# Patient Record
Sex: Female | Born: 1960
Health system: Southern US, Community
[De-identification: ages and names within clinical notes are randomized; demographics above are authoritative.]

## PROBLEM LIST (undated history)

## (undated) DIAGNOSIS — F419 Anxiety disorder, unspecified: Secondary | ICD-10-CM

## (undated) DIAGNOSIS — Z973 Presence of spectacles and contact lenses: Secondary | ICD-10-CM

## (undated) DIAGNOSIS — M1990 Unspecified osteoarthritis, unspecified site: Secondary | ICD-10-CM

## (undated) DIAGNOSIS — F32A Depression, unspecified: Secondary | ICD-10-CM

## (undated) DIAGNOSIS — T7840XA Allergy, unspecified, initial encounter: Secondary | ICD-10-CM

## (undated) DIAGNOSIS — K219 Gastro-esophageal reflux disease without esophagitis: Secondary | ICD-10-CM

## (undated) DIAGNOSIS — I10 Essential (primary) hypertension: Secondary | ICD-10-CM

## (undated) DIAGNOSIS — D649 Anemia, unspecified: Secondary | ICD-10-CM

## (undated) DIAGNOSIS — R51 Headache: Secondary | ICD-10-CM

## (undated) DIAGNOSIS — J45909 Unspecified asthma, uncomplicated: Secondary | ICD-10-CM

## (undated) DIAGNOSIS — R519 Headache, unspecified: Secondary | ICD-10-CM

## (undated) HISTORY — DX: Allergy, unspecified, initial encounter: T78.40XA

## (undated) HISTORY — PX: CHOLECYSTECTOMY: SHX55

## (undated) HISTORY — DX: Anxiety disorder, unspecified: F41.9

## (undated) HISTORY — DX: Depression, unspecified: F32.A

## (undated) HISTORY — PX: DILATION AND CURETTAGE OF UTERUS: SHX78

## (undated) MED FILL — Iron Sucrose Inj 20 MG/ML (Fe Equiv): INTRAVENOUS | Qty: 10 | Status: AC

---

## 1995-11-06 HISTORY — PX: INDUCED ABORTION: SHX677

## 2003-11-06 HISTORY — PX: GASTRIC BYPASS: SHX52

## 2006-02-25 ENCOUNTER — Ambulatory Visit: Payer: Self-pay | Admitting: Internal Medicine

## 2006-03-05 ENCOUNTER — Ambulatory Visit: Payer: Self-pay | Admitting: Internal Medicine

## 2006-03-29 ENCOUNTER — Ambulatory Visit: Payer: Self-pay | Admitting: Unknown Physician Specialty

## 2006-04-05 ENCOUNTER — Ambulatory Visit: Payer: Self-pay | Admitting: Internal Medicine

## 2006-04-08 ENCOUNTER — Ambulatory Visit: Payer: Self-pay | Admitting: Unknown Physician Specialty

## 2006-05-05 ENCOUNTER — Ambulatory Visit: Payer: Self-pay | Admitting: Internal Medicine

## 2006-06-05 ENCOUNTER — Ambulatory Visit: Payer: Self-pay | Admitting: Internal Medicine

## 2006-08-01 ENCOUNTER — Ambulatory Visit: Payer: Self-pay | Admitting: Internal Medicine

## 2006-08-05 ENCOUNTER — Ambulatory Visit: Payer: Self-pay | Admitting: Internal Medicine

## 2006-10-02 ENCOUNTER — Ambulatory Visit: Payer: Self-pay | Admitting: Internal Medicine

## 2006-10-05 ENCOUNTER — Ambulatory Visit: Payer: Self-pay | Admitting: Internal Medicine

## 2006-11-05 ENCOUNTER — Ambulatory Visit: Payer: Self-pay | Admitting: Internal Medicine

## 2006-12-11 ENCOUNTER — Ambulatory Visit: Payer: Self-pay | Admitting: Internal Medicine

## 2007-01-04 ENCOUNTER — Ambulatory Visit: Payer: Self-pay | Admitting: Internal Medicine

## 2007-09-06 ENCOUNTER — Ambulatory Visit: Payer: Self-pay | Admitting: Internal Medicine

## 2007-09-22 ENCOUNTER — Ambulatory Visit: Payer: Self-pay | Admitting: Internal Medicine

## 2007-10-06 ENCOUNTER — Ambulatory Visit: Payer: Self-pay | Admitting: Internal Medicine

## 2007-11-04 ENCOUNTER — Ambulatory Visit: Payer: Self-pay | Admitting: Unknown Physician Specialty

## 2007-11-06 ENCOUNTER — Ambulatory Visit: Payer: Self-pay | Admitting: Internal Medicine

## 2007-12-07 ENCOUNTER — Ambulatory Visit: Payer: Self-pay | Admitting: Internal Medicine

## 2008-02-04 ENCOUNTER — Ambulatory Visit: Payer: Self-pay | Admitting: Internal Medicine

## 2008-03-05 ENCOUNTER — Ambulatory Visit: Payer: Self-pay | Admitting: Internal Medicine

## 2008-05-10 ENCOUNTER — Ambulatory Visit: Payer: Self-pay | Admitting: Internal Medicine

## 2008-06-05 ENCOUNTER — Ambulatory Visit: Payer: Self-pay | Admitting: Internal Medicine

## 2008-11-05 ENCOUNTER — Ambulatory Visit: Payer: Self-pay | Admitting: Internal Medicine

## 2008-11-09 ENCOUNTER — Ambulatory Visit: Payer: Self-pay | Admitting: Unknown Physician Specialty

## 2008-11-10 ENCOUNTER — Ambulatory Visit: Payer: Self-pay | Admitting: Internal Medicine

## 2008-12-06 ENCOUNTER — Ambulatory Visit: Payer: Self-pay | Admitting: Internal Medicine

## 2010-02-27 ENCOUNTER — Emergency Department: Payer: Self-pay | Admitting: Emergency Medicine

## 2010-05-06 ENCOUNTER — Observation Stay: Payer: Self-pay | Admitting: Internal Medicine

## 2010-05-06 ENCOUNTER — Ambulatory Visit: Payer: Self-pay | Admitting: Internal Medicine

## 2010-06-26 ENCOUNTER — Ambulatory Visit: Payer: Self-pay | Admitting: Unknown Physician Specialty

## 2010-07-07 ENCOUNTER — Inpatient Hospital Stay: Payer: Self-pay | Admitting: Unknown Physician Specialty

## 2010-07-11 LAB — PATHOLOGY REPORT

## 2010-08-16 ENCOUNTER — Ambulatory Visit: Payer: Self-pay | Admitting: Unknown Physician Specialty

## 2010-11-05 HISTORY — PX: ABDOMINAL HYSTERECTOMY: SHX81

## 2011-09-24 ENCOUNTER — Ambulatory Visit: Payer: Self-pay | Admitting: Cardiovascular Disease

## 2012-07-06 LAB — HM PAP SMEAR: HM Pap smear: NORMAL

## 2013-09-18 ENCOUNTER — Ambulatory Visit: Payer: Self-pay | Admitting: Family Medicine

## 2013-09-18 LAB — HM MAMMOGRAPHY: HM Mammogram: NORMAL

## 2013-12-21 ENCOUNTER — Ambulatory Visit: Payer: Self-pay | Admitting: Family Medicine

## 2014-07-23 LAB — HM PAP SMEAR: HM Pap smear: NEGATIVE

## 2014-08-10 ENCOUNTER — Ambulatory Visit: Payer: Self-pay | Admitting: Gastroenterology

## 2014-08-10 LAB — HM COLONOSCOPY

## 2014-08-12 LAB — PATHOLOGY REPORT

## 2014-10-22 ENCOUNTER — Ambulatory Visit: Payer: Self-pay | Admitting: Internal Medicine

## 2014-10-23 ENCOUNTER — Emergency Department: Payer: Self-pay | Admitting: Emergency Medicine

## 2014-10-23 LAB — URINALYSIS, COMPLETE
Bacteria: NONE SEEN
Bilirubin,UR: NEGATIVE
Blood: NEGATIVE
Glucose,UR: NEGATIVE mg/dL (ref 0–75)
Ketone: NEGATIVE
Leukocyte Esterase: NEGATIVE
Nitrite: NEGATIVE
Ph: 6 (ref 4.5–8.0)
Protein: NEGATIVE
RBC,UR: 1 /HPF (ref 0–5)
Specific Gravity: 1.021 (ref 1.003–1.030)
Squamous Epithelial: 6
WBC UR: 1 /HPF (ref 0–5)

## 2014-10-23 LAB — CBC
HCT: 35.7 % (ref 35.0–47.0)
HGB: 11.6 g/dL — ABNORMAL LOW (ref 12.0–16.0)
MCH: 28.3 pg (ref 26.0–34.0)
MCHC: 32.5 g/dL (ref 32.0–36.0)
MCV: 87 fL (ref 80–100)
Platelet: 268 10*3/uL (ref 150–440)
RBC: 4.11 10*6/uL (ref 3.80–5.20)
RDW: 15.8 % — ABNORMAL HIGH (ref 11.5–14.5)
WBC: 6.2 10*3/uL (ref 3.6–11.0)

## 2014-10-23 LAB — TSH: Thyroid Stimulating Horm: 1.05 u[IU]/mL

## 2014-10-23 LAB — COMPREHENSIVE METABOLIC PANEL
Albumin: 3.4 g/dL (ref 3.4–5.0)
Alkaline Phosphatase: 117 U/L — ABNORMAL HIGH
Anion Gap: 7 (ref 7–16)
BUN: 13 mg/dL (ref 7–18)
Bilirubin,Total: 0.6 mg/dL (ref 0.2–1.0)
Calcium, Total: 8.5 mg/dL (ref 8.5–10.1)
Chloride: 102 mmol/L (ref 98–107)
Co2: 29 mmol/L (ref 21–32)
Creatinine: 0.98 mg/dL (ref 0.60–1.30)
EGFR (African American): >60
EGFR (Non-African Amer.): >60
Glucose: 89 mg/dL (ref 65–99)
Osmolality: 275 (ref 275–301)
Potassium: 3.5 mmol/L (ref 3.5–5.1)
SGOT(AST): 22 U/L (ref 15–37)
SGPT (ALT): 14 U/L
Sodium: 138 mmol/L (ref 136–145)
Total Protein: 7.1 g/dL (ref 6.4–8.2)

## 2014-10-23 LAB — TROPONIN I: Troponin-I: <0.02 ng/mL

## 2015-03-03 LAB — LIPID PANEL
Cholesterol: 220 mg/dL — AB (ref 0–200)
HDL: 78 mg/dL — AB (ref 35–70)
LDL Cholesterol: 121 mg/dL
Triglycerides: 104 mg/dL (ref 40–160)

## 2015-04-21 ENCOUNTER — Ambulatory Visit: Payer: Self-pay | Admitting: Family Medicine

## 2015-04-29 ENCOUNTER — Telehealth: Payer: Self-pay

## 2015-04-29 NOTE — Telephone Encounter (Signed)
Patient requesting refill. 

## 2015-04-29 NOTE — Telephone Encounter (Signed)
Could you schedule patient a follow up, and send Dr. Carlynn Purl the appointment the details.

## 2015-04-29 NOTE — Telephone Encounter (Signed)
Is she requesting 90 days? She needs follow up in July or August, please schedule and I will send enough

## 2015-05-02 NOTE — Telephone Encounter (Signed)
Patient will call back when she is in front of her calendar.

## 2015-05-02 NOTE — Telephone Encounter (Signed)
Could you please schedule patient a follow up appointment.

## 2015-05-04 MED ORDER — CHLORTHALIDONE 25 MG PO TABS: 25.0000 mg | ORAL_TABLET | Freq: Every day | ORAL | Status: DC

## 2015-05-04 MED ORDER — METOPROLOL TARTRATE 25 MG PO TABS: 25.0000 mg | ORAL_TABLET | Freq: Two times a day (BID) | ORAL | Status: DC

## 2015-05-04 NOTE — Telephone Encounter (Signed)
Please call her back. I sent 30 days of the blood pressure medications, but she needs follow up for the other refills, or at least have an appointment scheduled. Thank you

## 2015-05-05 ENCOUNTER — Other Ambulatory Visit: Payer: Self-pay

## 2015-05-06 NOTE — Telephone Encounter (Signed)
Left patient vm to make follow up appointment and also that Dr. Carlynn PurlSowles called in a 30 day supply for patient BP medication.

## 2015-05-10 ENCOUNTER — Other Ambulatory Visit: Payer: Self-pay

## 2015-05-10 MED ORDER — MELOXICAM 7.5 MG PO TABS: 7.5000 mg | ORAL_TABLET | Freq: Every day | ORAL | Status: DC

## 2015-05-10 NOTE — Telephone Encounter (Signed)
Spoke with patient and she will call us back once she get her information fixed with the insurance company.

## 2015-05-12 ENCOUNTER — Encounter: Payer: Self-pay | Admitting: Family Medicine

## 2015-05-12 DIAGNOSIS — R001 Bradycardia, unspecified: Secondary | ICD-10-CM | POA: Insufficient documentation

## 2015-05-12 DIAGNOSIS — F32A Depression, unspecified: Secondary | ICD-10-CM | POA: Insufficient documentation

## 2015-05-12 DIAGNOSIS — F329 Major depressive disorder, single episode, unspecified: Secondary | ICD-10-CM | POA: Insufficient documentation

## 2015-05-12 DIAGNOSIS — J309 Allergic rhinitis, unspecified: Secondary | ICD-10-CM | POA: Insufficient documentation

## 2015-05-12 DIAGNOSIS — L304 Erythema intertrigo: Secondary | ICD-10-CM | POA: Insufficient documentation

## 2015-05-12 DIAGNOSIS — E785 Hyperlipidemia, unspecified: Secondary | ICD-10-CM | POA: Insufficient documentation

## 2015-05-12 DIAGNOSIS — I10 Essential (primary) hypertension: Secondary | ICD-10-CM | POA: Insufficient documentation

## 2015-05-12 DIAGNOSIS — R600 Localized edema: Secondary | ICD-10-CM | POA: Insufficient documentation

## 2015-05-12 DIAGNOSIS — G43909 Migraine, unspecified, not intractable, without status migrainosus: Secondary | ICD-10-CM | POA: Insufficient documentation

## 2015-05-12 DIAGNOSIS — E162 Hypoglycemia, unspecified: Secondary | ICD-10-CM | POA: Insufficient documentation

## 2015-05-12 DIAGNOSIS — K219 Gastro-esophageal reflux disease without esophagitis: Secondary | ICD-10-CM | POA: Insufficient documentation

## 2015-05-12 DIAGNOSIS — F419 Anxiety disorder, unspecified: Secondary | ICD-10-CM | POA: Insufficient documentation

## 2015-05-12 DIAGNOSIS — T50905A Adverse effect of unspecified drugs, medicaments and biological substances, initial encounter: Secondary | ICD-10-CM

## 2015-05-12 DIAGNOSIS — G4700 Insomnia, unspecified: Secondary | ICD-10-CM | POA: Insufficient documentation

## 2015-05-12 DIAGNOSIS — K429 Umbilical hernia without obstruction or gangrene: Secondary | ICD-10-CM | POA: Insufficient documentation

## 2015-05-12 DIAGNOSIS — M722 Plantar fascial fibromatosis: Secondary | ICD-10-CM | POA: Insufficient documentation

## 2015-05-13 ENCOUNTER — Encounter: Payer: Self-pay | Admitting: Family Medicine

## 2015-05-13 ENCOUNTER — Ambulatory Visit (INDEPENDENT_AMBULATORY_CARE_PROVIDER_SITE_OTHER): Payer: BLUE CROSS/BLUE SHIELD | Admitting: Family Medicine

## 2015-05-13 VITALS — BP 118/66 | HR 89 | Temp 97.8°F | Resp 16 | Ht 67.0 in | Wt 246.3 lb

## 2015-05-13 DIAGNOSIS — R197 Diarrhea, unspecified: Secondary | ICD-10-CM

## 2015-05-13 DIAGNOSIS — G4700 Insomnia, unspecified: Secondary | ICD-10-CM

## 2015-05-13 DIAGNOSIS — R300 Dysuria: Secondary | ICD-10-CM

## 2015-05-13 DIAGNOSIS — M545 Low back pain, unspecified: Secondary | ICD-10-CM

## 2015-05-13 DIAGNOSIS — E785 Hyperlipidemia, unspecified: Secondary | ICD-10-CM

## 2015-05-13 LAB — POCT URINALYSIS DIPSTICK
Bilirubin, UA: NEGATIVE
Blood, UA: NEGATIVE
Glucose, UA: NEGATIVE
Ketones, UA: NEGATIVE
Leukocytes, UA: NEGATIVE
Nitrite, UA: NEGATIVE
Protein, UA: NEGATIVE
Spec Grav, UA: 1.015
Urobilinogen, UA: 0.2
pH, UA: 6.5

## 2015-05-13 MED ORDER — SIMVASTATIN 20 MG PO TABS: 20.0000 mg | ORAL_TABLET | Freq: Every day | ORAL | Status: DC

## 2015-05-13 MED ORDER — MELOXICAM 7.5 MG PO TABS: 7.5000 mg | ORAL_TABLET | Freq: Every day | ORAL | Status: DC

## 2015-05-13 MED ORDER — CIPROFLOXACIN HCL 250 MG PO TABS: 250.0000 mg | ORAL_TABLET | Freq: Two times a day (BID) | ORAL | Status: DC

## 2015-05-13 MED ORDER — TRAZODONE HCL 50 MG PO TABS: 50.0000 mg | ORAL_TABLET | Freq: Every day | ORAL | Status: DC

## 2015-05-13 NOTE — Progress Notes (Signed)
Name: Savannah Manning   MRN: [id number]    DOB: April 09, 1961   Date:05/13/2015       Progress Note  Subjective  Chief Complaint  Chief Complaint  Patient presents with  . Diarrhea    since monday nausea no vomiting with abdominal pain. 4-5x times per day denies any blood  . Back Pain    low back since monday, burning when urinating, no abnormal activity or lifting  . Blood Pressure Check    pt wants froms resubmitted from last year with current vital signs  . Headache    not a miagraine started monday also, but has not took anything for it    HPI  Diarrhea: she states that she ate at a chinese buffet in [place] six days ago, the following day she felt nauseated, regurgitation and the following day developed bloating and 2 days later developed loose stools that is getting progressively worse. She has watery stools at least five times daily, some nausea but no vomiting. Mild abdominal cramping and bloating.  She went with friends but she is the only one that got sick, however everybody else had crab legs.  She is feeling tired and has a headache. She has increased her fluid intake but not sure of how many ounces per day.  Dysuria: she stated that over the past few days she developed dysuria, she denies urinary frequency,  noticing low back pain.   Insomnia: taking Trazodone and needs refill of medication  Hyperlipidemia: taking Simvastatin, and denies side effects of medication  Patient Active Problem List   Diagnosis Date Noted  . Allergic rhinitis 05/12/2015  . Benign essential HTN 05/12/2015  . Anxiety and depression 05/12/2015  . Bradycardia, drug induced 05/12/2015  . Insomnia, persistent 05/12/2015  . Dyslipidemia 05/12/2015  . Edema extremities 05/12/2015  . Gastro-esophageal reflux disease without esophagitis 05/12/2015  . Hypoglycemia 05/12/2015  . Eczema intertrigo 05/12/2015  . Anemia, iron deficiency 05/12/2015  . Migraine 05/12/2015  . Plantar fasciitis 05/12/2015  .  Umbilical hernia without obstruction and without gangrene 05/12/2015  . Extreme obesity 02/13/2008    Past Surgical History  Procedure Laterality Date  . Abdominal hysterectomy  2012  . Gastric bypass  2005    Family History  Problem Relation Age of Onset  . Hypertension Mother   . Cancer Father     prostate  . Heart disease Brother     History   Social History  . Marital Status: Single    Spouse Name: N/A  . Number of Children: N/A  . Years of Education: N/A   Occupational History  . Not on file.   Social History Main Topics  . Smoking status: Never Smoker   . Smokeless tobacco: Never Used  . Alcohol Use: No  . Drug Use: No  . Sexual Activity: Not Currently   Other Topics Concern  . Not on file   Social History Narrative     Current outpatient prescriptions:  .  albuterol (VENTOLIN HFA) 108 (90 BASE) MCG/ACT inhaler, Inhale into the lungs., Disp: , Rfl:  .  Butalbital-APAP-Caffeine 50-300-40 MG CAPS, Take by mouth., Disp: , Rfl:  .  chlorthalidone (HYGROTON) 25 MG tablet, Take 1 tablet (25 mg total) by mouth daily., Disp: 30 tablet, Rfl: 0 .  loratadine (CLARITIN) 10 MG tablet, Take by mouth., Disp: , Rfl:  .  Lorcaserin HCl (BELVIQ) 10 MG TABS, Take by mouth., Disp: , Rfl:  .  meloxicam (MOBIC) 7.5 MG tablet, Take 1  tablet (7.5 mg total) by mouth daily., Disp: 90 tablet, Rfl: 1 .  metoprolol tartrate (LOPRESSOR) 25 MG tablet, Take 1 tablet (25 mg total) by mouth 2 (two) times daily., Disp: 30 tablet, Rfl: 0 .  omeprazole (PRILOSEC) 40 MG capsule, Take by mouth., Disp: , Rfl:  .  ondansetron (ZOFRAN) 4 MG tablet, Take by mouth., Disp: , Rfl:  .  simvastatin (ZOCOR) 20 MG tablet, Take 1 tablet (20 mg total) by mouth daily at 6 PM., Disp: 90 tablet, Rfl: 1 .  traZODone (DESYREL) 50 MG tablet, Take 1 tablet (50 mg total) by mouth at bedtime., Disp: 90 tablet, Rfl: 1 .  ciprofloxacin (CIPRO) 250 MG tablet, Take 1 tablet (250 mg total) by mouth 2 (two) times daily.,  Disp: 10 tablet, Rfl: 0  Allergies  Allergen Reactions  . Lisinopril Swelling    Facial Swelling  . Ace Inhibitors     angioedema  . Codeine   . Valsartan Other (See Comments)     ROS  Constitutional: Negative for fever , positive for weight change.  Respiratory: Negative for cough and shortness of breath.   Cardiovascular: Negative for chest pain or palpitations.  Gastrointestinal: positive  for abdominal pain, no bowel changes.  Musculoskeletal: Negative for gait problem or joint swelling.  Skin: positive  for rash.  Neurological: Negative for dizziness or headache.  No other specific complaints in a complete review of systems (except as listed in HPI above).  Objective  Filed Vitals:   05/13/15 0952  BP: 118/66  Pulse: 89  Temp: 97.8 F (36.6 C)  TempSrc: Oral  Resp: 16  Height: 5\' 7"  (1.702 m)  Weight: 246 lb 4.8 oz (111.721 kg)  SpO2: 98%    Body mass index is 38.57 kg/(m^2).  Physical Exam  Constitutional: Patient appears well-developed and well-nourished. No distress. Obesity  Eyes:  No scleral icterus. PERL Neck: Normal range of motion. Neck supple. Cardiovascular: Normal rate, regular rhythm and normal heart sounds.  No murmur heard. No BLE edema. Pulmonary/Chest: Effort normal and breath sounds normal. No respiratory distress. Abdominal: Soft.  Positive for diffuse tenderness , no guarding or rebound tenderness. Psychiatric: Patient has a normal mood and affect. behavior is normal. Judgment and thought content normal.  Recent Results (from the past 2160 hour(s))  Lipid panel     Status: Abnormal   Collection Time: 03/03/15 12:00 AM  Result Value Ref Range   Triglycerides 104 40 - 160 mg/dL   Cholesterol 147220 (A) 0 - 200 mg/dL   HDL 78 (A) 35 - 70 mg/dL   LDL Cholesterol 829121 mg/dL  POCT urinalysis dipstick     Status: Normal   Collection Time: 05/13/15 10:21 AM  Result Value Ref Range   Color, UA dark yellow    Clarity, UA clear    Glucose, UA  neg    Bilirubin, UA neg    Ketones, UA neg    Spec Grav, UA 1.015    Blood, UA neg    pH, UA 6.5    Protein, UA neg    Urobilinogen, UA 0.2    Nitrite, UA neg    Leukocytes, UA Negative Negative      PHQ2/9: Depression screen PHQ 2/9 05/13/2015  Decreased Interest 0  Down, Depressed, Hopeless 0  PHQ - 2 Score 0     Fall Risk: Fall Risk  05/13/2015  Falls in the past year? No    Assessment & Plan  1. Burning with urination  -  POCT urinalysis dipstick - Urine Culture Cipro 250 mg for 5 days for gastroenteritis and UTI  2. Diarrhea Advised otc Imodium, stay hydrated, drinking fluids every 10 minutes, check stools for culture  3. Midline low back pain without sciatica Likely from cystitis and gastroenteritis, advised Tylenol prn  4. Insomnia  - traZODone (DESYREL) 50 MG tablet; Take 1 tablet (50 mg total) by mouth at bedtime.  Dispense: 90 tablet; Refill: 1  5. Dyslipidemia  - simvastatin (ZOCOR) 20 MG tablet; Take 1 tablet (20 mg total) by mouth daily at 6 PM.  Dispense: 90 tablet; Refill: 1

## 2015-05-13 NOTE — Patient Instructions (Signed)
Dehydration, Adult Dehydration is when you lose more fluids from the body than you take in. Vital organs like the kidneys, brain, and heart cannot function without a proper amount of fluids and salt. Any loss of fluids from the body can cause dehydration.  CAUSES   Vomiting.  Diarrhea.  Excessive sweating.  Excessive urine output.  Fever. SYMPTOMS  Mild dehydration  Thirst.  Dry lips.  Slightly dry mouth. Moderate dehydration  Very dry mouth.  Sunken eyes.  Skin does not bounce back quickly when lightly pinched and released.  Dark urine and decreased urine production.  Decreased tear production.  Headache. Severe dehydration  Very dry mouth.  Extreme thirst.  Rapid, weak pulse (more than 100 beats per minute at rest).  Cold hands and feet.  Not able to sweat in spite of heat and temperature.  Rapid breathing.  Blue lips.  Confusion and lethargy.  Difficulty being awakened.  Minimal urine production.  No tears. DIAGNOSIS  Your caregiver will diagnose dehydration based on your symptoms and your exam. Blood and urine tests will help confirm the diagnosis. The diagnostic evaluation should also identify the cause of dehydration. TREATMENT  Treatment of mild or moderate dehydration can often be done at home by increasing the amount of fluids that you drink. It is best to drink small amounts of fluid more often. Drinking too much at one time can make vomiting worse. Refer to the home care instructions below. Severe dehydration needs to be treated at the hospital where you will probably be given intravenous (IV) fluids that contain water and electrolytes. HOME CARE INSTRUCTIONS   Ask your caregiver about specific rehydration instructions.  Drink enough fluids to keep your urine clear or pale yellow.  Drink small amounts frequently if you have nausea and vomiting.  Eat as you normally do.  Avoid:  Foods or drinks high in sugar.  Carbonated  drinks.  Juice.  Extremely hot or cold fluids.  Drinks with caffeine.  Fatty, greasy foods.  Alcohol.  Tobacco.  Overeating.  Gelatin desserts.  Wash your hands well to avoid spreading bacteria and viruses.  Only take over-the-counter or prescription medicines for pain, discomfort, or fever as directed by your caregiver.  Ask your caregiver if you should continue all prescribed and over-the-counter medicines.  Keep all follow-up appointments with your caregiver. SEEK MEDICAL CARE IF:  You have abdominal pain and it increases or stays in one area (localizes).  You have a rash, stiff neck, or severe headache.  You are irritable, sleepy, or difficult to awaken.  You are weak, dizzy, or extremely thirsty. SEEK IMMEDIATE MEDICAL CARE IF:   You are unable to keep fluids down or you get worse despite treatment.  You have frequent episodes of vomiting or diarrhea.  You have blood or green matter (bile) in your vomit.  You have blood in your stool or your stool looks black and tarry.  You have not urinated in 6 to 8 hours, or you have only urinated a small amount of very dark urine.  You have a fever.  You faint. MAKE SURE YOU:   Understand these instructions.  Will watch your condition.  Will get help right away if you are not doing well or get worse. Document Released: 10/22/2005 Document Revised: 01/14/2012 Document Reviewed: 06/11/2011 Exit[employer] Patient Information 2015 [employer]. This information is not intended to replace advice given to you by your health care provider. Make sure you discuss any questions you have with your health care   provider.  

## 2015-05-14 LAB — URINE CULTURE

## 2015-05-16 NOTE — Progress Notes (Signed)
Patient notified

## 2015-08-12 ENCOUNTER — Other Ambulatory Visit: Payer: Self-pay | Admitting: Family Medicine

## 2015-08-17 ENCOUNTER — Ambulatory Visit: Payer: BLUE CROSS/BLUE SHIELD | Admitting: Family Medicine

## 2015-09-22 ENCOUNTER — Ambulatory Visit
Admission: RE | Admit: 2015-09-22 | Discharge: 2015-09-22 | Disposition: A | Discharge status: Home / Self Care | Payer: BLUE CROSS/BLUE SHIELD | Source: Ambulatory Visit | Attending: Family Medicine | Admitting: Family Medicine

## 2015-09-22 ENCOUNTER — Ambulatory Visit (INDEPENDENT_AMBULATORY_CARE_PROVIDER_SITE_OTHER): Payer: BLUE CROSS/BLUE SHIELD | Admitting: Family Medicine

## 2015-09-22 ENCOUNTER — Encounter: Payer: Self-pay | Admitting: Family Medicine

## 2015-09-22 VITALS — BP 120/80 | HR 85 | Temp 98.1°F | Resp 18 | Ht 67.0 in | Wt 248.0 lb

## 2015-09-22 DIAGNOSIS — R05 Cough: Secondary | ICD-10-CM

## 2015-09-22 DIAGNOSIS — R071 Chest pain on breathing: Secondary | ICD-10-CM

## 2015-09-22 DIAGNOSIS — R058 Other specified cough: Secondary | ICD-10-CM | POA: Insufficient documentation

## 2015-09-22 DIAGNOSIS — J329 Chronic sinusitis, unspecified: Secondary | ICD-10-CM | POA: Diagnosis not present

## 2015-09-22 DIAGNOSIS — R0982 Postnasal drip: Secondary | ICD-10-CM

## 2015-09-22 MED ORDER — FLUTICASONE PROPIONATE 50 MCG/ACT NA SUSP: 2.0000 | Freq: Every day | NASAL | Status: DC

## 2015-09-22 MED ORDER — BENZONATATE 200 MG PO CAPS: 200.0000 mg | ORAL_CAPSULE | Freq: Three times a day (TID) | ORAL | Status: DC | PRN

## 2015-09-22 MED ORDER — AZITHROMYCIN 250 MG PO TABS: ORAL_TABLET | ORAL | Status: DC

## 2015-09-22 NOTE — Progress Notes (Signed)
Name: Savannah Manning   MRN: [id number]    DOB: January 25, 1961   Date:09/22/2015       Progress Note  Subjective  Chief Complaint  Chief Complaint  Patient presents with  . Acute Visit    Chills, bad cough  . Chest Pain    Cough This is a new problem. Episode onset: 10 days ago. The cough is productive of sputum. Associated symptoms include chest pain (with cough and with deep inspiration.), a fever, headaches and shortness of breath. Pertinent negatives include no ear pain, nasal congestion or sore throat (sore throat at the onset of symptoms, now resolved). She has tried OTC cough suppressant for the symptoms. Her past medical history is significant for asthma. There is no history of COPD.    History reviewed. No pertinent past medical history.  Past Surgical History  Procedure Laterality Date  . Abdominal hysterectomy  2012  . Gastric bypass  2005    Family History  Problem Relation Age of Onset  . Hypertension Mother   . Cancer Father     prostate  . Heart disease Brother     Social History   Social History  . Marital Status: Single    Spouse Name: N/A  . Number of Children: N/A  . Years of Education: N/A   Occupational History  . Not on file.   Social History Main Topics  . Smoking status: Never Smoker   . Smokeless tobacco: Never Used  . Alcohol Use: No  . Drug Use: No  . Sexual Activity: Not Currently   Other Topics Concern  . Not on file   Social History Narrative     Current outpatient prescriptions:  .  albuterol (VENTOLIN HFA) 108 (90 BASE) MCG/ACT inhaler, Inhale into the lungs., Disp: , Rfl:  .  chlorthalidone (HYGROTON) 25 MG tablet, Take 1 tablet (25 mg total) by mouth daily., Disp: 30 tablet, Rfl: 0 .  loratadine (CLARITIN) 10 MG tablet, Take by mouth., Disp: , Rfl:  .  meloxicam (MOBIC) 7.5 MG tablet, Take 1 tablet (7.5 mg total) by mouth daily., Disp: 90 tablet, Rfl: 1 .  metoprolol tartrate (LOPRESSOR) 25 MG tablet, Take 1 tablet (25 mg  total) by mouth 2 (two) times daily., Disp: 30 tablet, Rfl: 0 .  omeprazole (PRILOSEC) 40 MG capsule, Take 1 capsule (40 mg total) by mouth daily., Disp: 30 capsule, Rfl: 5 .  simvastatin (ZOCOR) 20 MG tablet, Take 1 tablet (20 mg total) by mouth daily at 6 PM., Disp: 90 tablet, Rfl: 1 .  traZODone (DESYREL) 50 MG tablet, Take 1 tablet (50 mg total) by mouth at bedtime., Disp: 90 tablet, Rfl: 1 .  Butalbital-APAP-Caffeine 50-300-40 MG CAPS, Take by mouth., Disp: , Rfl:  .  ciprofloxacin (CIPRO) 250 MG tablet, Take 1 tablet (250 mg total) by mouth 2 (two) times daily. (Patient not taking: Reported on 09/22/2015), Disp: 10 tablet, Rfl: 0 .  Lorcaserin HCl (BELVIQ) 10 MG TABS, Take by mouth., Disp: , Rfl:  .  ondansetron (ZOFRAN) 4 MG tablet, Take by mouth., Disp: , Rfl:   Allergies  Allergen Reactions  . Lisinopril Swelling    Facial Swelling  . Ace Inhibitors     angioedema  . Codeine   . Valsartan Other (See Comments)     Review of Systems  Constitutional: Positive for fever.  HENT: Negative for ear pain and sore throat (sore throat at the onset of symptoms, now resolved).   Respiratory: Positive for cough and  shortness of breath.   Cardiovascular: Positive for chest pain (with cough and with deep inspiration.).  Neurological: Positive for headaches.    Objective  Filed Vitals:   09/22/15 1352  BP: 120/80  Pulse: 85  Temp: 98.1 F (36.7 C)  TempSrc: Oral  Resp: 18  Height: 5\' 7"  (1.702 m)  Weight: 248 lb (112.492 kg)  SpO2: 96%    Physical Exam  Constitutional: She is oriented to person, place, and time and well-developed, well-nourished, and in no distress.  HENT:  Head: Normocephalic and atraumatic.  Right Ear: Tympanic membrane and ear canal normal.  Left Ear: Tympanic membrane and ear canal normal.  Nose: Mucosal edema present. Right sinus exhibits maxillary sinus tenderness. Right sinus exhibits no frontal sinus tenderness. Left sinus exhibits maxillary sinus  tenderness. Left sinus exhibits no frontal sinus tenderness.  Mouth/Throat: Oropharynx is clear and moist and mucous membranes are normal. No posterior oropharyngeal erythema.  Cardiovascular: Normal rate, regular rhythm and normal heart sounds.   No murmur heard. Pulmonary/Chest: Effort normal and breath sounds normal. No respiratory distress. She has no decreased breath sounds. She has no wheezes.  Neurological: She is alert and oriented to person, place, and time.  Nursing note and vitals reviewed.   Assessment & Plan  1. Post-nasal drainage  Likely acute sinusitis with postnasal drainage. We'll start on antibiotics and nasal spray. - azithromycin (ZITHROMAX) 250 MG tablet; 2 tabs po x day 1, then 1 tab po q day x 4 days  Dispense: 6 tablet; Refill: 0 - fluticasone (FLONASE) 50 MCG/ACT nasal spray; Place 2 sprays into both nostrils daily.  Dispense: 16 g; Refill: 0  2. Chest pain made worse by breathing  EKG, obtained in the office today, was unremarkable. We'll obtain chest x-ray. - DG Chest 2 View; Future - EKG 12-Lead  3. Productive cough  - benzonatate (TESSALON) 200 MG capsule; Take 1 capsule (200 mg total) by mouth 3 (three) times daily as needed for cough.  Dispense: 21 capsule; Refill: 0  Marquetta Weiskopf Asad A. Faylene KurtzShah [facility] 09/22/2015 2:11 PM

## 2015-11-09 ENCOUNTER — Telehealth: Payer: Self-pay

## 2015-11-09 NOTE — Telephone Encounter (Signed)
Patient is not interested in the influenza vaccine.

## 2016-03-22 ENCOUNTER — Encounter: Payer: Self-pay | Admitting: Family Medicine

## 2016-03-22 ENCOUNTER — Ambulatory Visit (INDEPENDENT_AMBULATORY_CARE_PROVIDER_SITE_OTHER): Payer: BLUE CROSS/BLUE SHIELD | Admitting: Family Medicine

## 2016-03-22 VITALS — BP 116/74 | HR 84 | Temp 97.8°F | Resp 16 | Ht 67.0 in | Wt 254.8 lb

## 2016-03-22 DIAGNOSIS — I10 Essential (primary) hypertension: Secondary | ICD-10-CM | POA: Diagnosis not present

## 2016-03-22 DIAGNOSIS — E785 Hyperlipidemia, unspecified: Secondary | ICD-10-CM

## 2016-03-22 DIAGNOSIS — M7542 Impingement syndrome of left shoulder: Secondary | ICD-10-CM | POA: Diagnosis not present

## 2016-03-22 DIAGNOSIS — D509 Iron deficiency anemia, unspecified: Secondary | ICD-10-CM

## 2016-03-22 DIAGNOSIS — Z131 Encounter for screening for diabetes mellitus: Secondary | ICD-10-CM

## 2016-03-22 DIAGNOSIS — M542 Cervicalgia: Secondary | ICD-10-CM | POA: Diagnosis not present

## 2016-03-22 MED ORDER — TRAMADOL HCL 50 MG PO TABS: 50.0000 mg | ORAL_TABLET | Freq: Three times a day (TID) | ORAL | Status: DC | PRN

## 2016-03-22 MED ORDER — PREDNISONE 10 MG (48) PO TBPK: ORAL_TABLET | Freq: Every day | ORAL | Status: DC

## 2016-03-22 NOTE — Progress Notes (Signed)
Name: Savannah Manning   MRN: [id number]    DOB: [birth year]   Date:03/22/2016       Progress Note  Subjective  Chief Complaint  Chief Complaint  Patient presents with  . Arm Pain    patient presents with left arm pain for a couple of weeks. no known injury. patient stated that it has progresed over time. limited range of motion. patient stated that it hurts to lift it and the pain radiates to her thumb. has tried stretches, heat and ice and some otc ibuprofen.    HPI   Shoulder pain: she states that over the past few weeks she has noticed progressively worsening of left shoulder pain. Described as a constant aching pain, better when not moving the shoulder and worse with movement such as abduction , internal rotation and when picking things up. She tried Meloxicam for a few days without improvement. Affecting her sleep at night because of pain. She has noticed pain radiating to palm of left hand and tingling on 3rd and 4th fingers intermittently. She also has some pain on left shoulder blade and sometimes neck feels stiff. She does not recall any injuries.    HTN: she has been taking Metoprolol only, no longer on fluid pill. No chest pain or palpitation  Hyperlipidemia: still taking Simvastatin, last rx written July 2016 for 6 months. Denies myalgias.  GERD: taking Omeprazole prn, no recent heartburn or regurgitation. Advised to take it daily while on prednisone  Patient Active Problem List   Diagnosis Date Noted  . Allergic rhinitis 05/12/2015  . Benign essential HTN 05/12/2015  . Anxiety and depression 05/12/2015  . Insomnia, persistent 05/12/2015  . Dyslipidemia 05/12/2015  . Edema extremities 05/12/2015  . Gastro-esophageal reflux disease without esophagitis 05/12/2015  . Hypoglycemia 05/12/2015  . Eczema intertrigo 05/12/2015  . Anemia, iron deficiency 05/12/2015  . Migraine 05/12/2015  . Plantar fasciitis 05/12/2015  . Umbilical hernia without obstruction and without gangrene  05/12/2015  . Extreme obesity (HCC) 02/13/2008    Past Surgical History  Procedure Laterality Date  . Abdominal hysterectomy  2012  . Gastric bypass  2005    Family History  Problem Relation Age of Onset  . Hypertension Mother   . Cancer Father     prostate  . Heart disease Brother     Social History   Social History  . Marital Status: Single    Spouse Name: N/A  . Number of Children: N/A  . Years of Education: N/A   Occupational History  . Not on file.   Social History Main Topics  . Smoking status: Never Smoker   . Smokeless tobacco: Never Used  . Alcohol Use: No  . Drug Use: No  . Sexual Activity: Not Currently   Other Topics Concern  . Not on file   Social History Narrative     Current outpatient prescriptions:  .  Butalbital-APAP-Caffeine 50-300-40 MG CAPS, Take by mouth., Disp: , Rfl:  .  fluticasone (FLONASE) 50 MCG/ACT nasal spray, Place 2 sprays into both nostrils daily., Disp: 16 g, Rfl: 0 .  loratadine (CLARITIN) 10 MG tablet, Take by mouth., Disp: , Rfl:  .  Lorcaserin HCl (BELVIQ) 10 MG TABS, Take by mouth., Disp: , Rfl:  .  metoprolol tartrate (LOPRESSOR) 25 MG tablet, Take 1 tablet (25 mg total) by mouth 2 (two) times daily., Disp: 30 tablet, Rfl: 0 .  omeprazole (PRILOSEC) 40 MG capsule, Take 1 capsule (40 mg total) by mouth daily., Disp:  30 capsule, Rfl: 5 .  ondansetron (ZOFRAN) 4 MG tablet, Take by mouth., Disp: , Rfl:  .  predniSONE (STERAPRED UNI-PAK 48 TAB) 10 MG (48) TBPK tablet, Take by mouth daily. Take as directed, Disp: 48 tablet, Rfl: 0 .  simvastatin (ZOCOR) 20 MG tablet, Take 1 tablet (20 mg total) by mouth daily at 6 PM., Disp: 90 tablet, Rfl: 1 .  traMADol (ULTRAM) 50 MG tablet, Take 1 tablet (50 mg total) by mouth every 8 (eight) hours as needed., Disp: 30 tablet, Rfl: 0 .  traZODone (DESYREL) 50 MG tablet, Take 1 tablet (50 mg total) by mouth at bedtime., Disp: 90 tablet, Rfl: 1  Allergies  Allergen Reactions  . Lisinopril  Swelling    Facial Swelling  . Ace Inhibitors     angioedema  . Codeine   . Valsartan Other (See Comments)     ROS  Ten systems reviewed and is negative except as mentioned in HPI   Objective  Filed Vitals:   03/22/16 1104  BP: 116/74  Pulse: 84  Temp: 97.8 F (36.6 C)  TempSrc: Oral  Resp: 16  Height: 5\' 7"  (1.702 m)  Weight: 254 lb 12.8 oz (115.577 kg)  SpO2: 97%    Body mass index is 39.9 kg/(m^2).  Physical Exam  Constitutional: Patient appears well-developed and well-nourished. Obese No distress.  HEENT: head atraumatic, normocephalic, pupils equal and reactive to light, neck supple, throat within normal limits Cardiovascular: Normal rate, regular rhythm and normal heart sounds.  No murmur heard. No BLE edema. Pulmonary/Chest: Effort normal and breath sounds normal. No respiratory distress. Abdominal: Soft.  There is no tenderness. Psychiatric: Patient has a normal mood and affect. behavior is normal. Judgment and thought content normal. Muscular Skeletal: pain with abduction, internal rotation and positive empty can sign, normal rom of neck, pain during palpation of left scapular area, no redness, normal grip and sensation  PHQ2/9: Depression screen [facility] 2/9 03/22/2016 09/22/2015 05/13/2015  Decreased Interest 0 0 0  Down, Depressed, Hopeless 0 0 0  PHQ - 2 Score 0 0 0     Fall Risk: Fall Risk  03/22/2016 09/22/2015 05/13/2015  Falls in the past year? No No No     Functional Status Survey: Is the patient deaf or have difficulty hearing?: No Does the patient have difficulty seeing, even when wearing glasses/contacts?: No Does the patient have difficulty concentrating, remembering, or making decisions?: No Does the patient have difficulty walking or climbing stairs?: No Does the patient have difficulty dressing or bathing?: No Does the patient have difficulty doing errands alone such as visiting a doctor's office or shopping?: No   Assessment & Plan  1.  Neck pain  - predniSONE (STERAPRED UNI-PAK 48 TAB) 10 MG (48) TBPK tablet; Take by mouth daily. Take as directed  Dispense: 48 tablet; Refill: 0 - traMADol (ULTRAM) 50 MG tablet; Take 1 tablet (50 mg total) by mouth every 8 (eight) hours as needed.  Dispense: 30 tablet; Refill: 0  2. Impingement syndrome of left shoulder  She will call back for referral to Ortho if no improvement, refused injection today - predniSONE (STERAPRED UNI-PAK 48 TAB) 10 MG (48) TBPK tablet; Take by mouth daily. Take as directed  Dispense: 48 tablet; Refill: 0 - traMADol (ULTRAM) 50 MG tablet; Take 1 tablet (50 mg total) by mouth every 8 (eight) hours as needed.  Dispense: 30 tablet; Refill: 0  3.Dyslipidemia  - Lipid panel  4. Anemia, iron deficiency  -CBC  5.  Morbid obesity, unspecified obesity type [facility](HCC)  Discussed with the patient the risk posed by an increased BMI. Discussed importance of portion control, calorie counting and at least 150 minutes of physical activity weekly. Avoid sweet beverages and drink more water. Eat at least 6 servings of fruit and vegetables daily   6. Benign essential HTN  - Comprehensive metabolic panel  7. Screening for diabetes mellitus  - Hemoglobin A1c

## 2016-03-22 NOTE — Patient Instructions (Signed)

## 2016-03-27 ENCOUNTER — Ambulatory Visit: Payer: BLUE CROSS/BLUE SHIELD | Admitting: Family Medicine

## 2016-05-01 ENCOUNTER — Telehealth: Payer: Self-pay | Admitting: Family Medicine

## 2016-05-01 MED ORDER — OMEPRAZOLE 40 MG PO CPDR: 40.0000 mg | DELAYED_RELEASE_CAPSULE | Freq: Every day | ORAL | Status: DC

## 2016-05-01 NOTE — Telephone Encounter (Signed)
Requesting refill on Omeprazole. She is completely out and want to know what she can do until this is refilled. States she is not able to sleep at night due to the reflux. Please send to [email or web address] Also, patient is wanting to come in to be seen for shoulder pain. States you have seen her for this before and think it is her rotor cuff. I told her I will give her a call back once your schedule is open.

## 2016-05-01 NOTE — Telephone Encounter (Signed)
Sent rx of Omeprazole to her pharmacy

## 2016-05-04 ENCOUNTER — Encounter: Payer: Self-pay | Admitting: Family Medicine

## 2016-05-04 ENCOUNTER — Ambulatory Visit (INDEPENDENT_AMBULATORY_CARE_PROVIDER_SITE_OTHER): Payer: BLUE CROSS/BLUE SHIELD | Admitting: Family Medicine

## 2016-05-04 VITALS — HR 59 | Temp 97.6°F | Resp 12 | Wt 256.6 lb

## 2016-05-04 DIAGNOSIS — I10 Essential (primary) hypertension: Secondary | ICD-10-CM | POA: Diagnosis not present

## 2016-05-04 DIAGNOSIS — M7542 Impingement syndrome of left shoulder: Secondary | ICD-10-CM

## 2016-05-04 DIAGNOSIS — Z9884 Bariatric surgery status: Secondary | ICD-10-CM

## 2016-05-04 DIAGNOSIS — Z131 Encounter for screening for diabetes mellitus: Secondary | ICD-10-CM | POA: Diagnosis not present

## 2016-05-04 DIAGNOSIS — K219 Gastro-esophageal reflux disease without esophagitis: Secondary | ICD-10-CM

## 2016-05-04 DIAGNOSIS — E785 Hyperlipidemia, unspecified: Secondary | ICD-10-CM | POA: Diagnosis not present

## 2016-05-04 NOTE — Patient Instructions (Signed)
Food Choices for Gastroesophageal Reflux Disease, Adult When you have gastroesophageal reflux disease (GERD), the foods you eat and your eating habits are very important. Choosing the right foods can help ease the discomfort of GERD. WHAT GENERAL GUIDELINES DO I NEED TO FOLLOW?  Choose fruits, vegetables, whole grains, low-fat dairy products, and low-fat meat, fish, and poultry.  Limit fats such as oils, salad dressings, butter, nuts, and avocado.  Keep a food diary to identify foods that cause symptoms.  Avoid foods that cause reflux. These may be different for different people.  Eat frequent small meals instead of three large meals each day.  Eat your meals slowly, in a relaxed setting.  Limit fried foods.  Cook foods using methods other than frying.  Avoid drinking alcohol.  Avoid drinking large amounts of liquids with your meals.  Avoid bending over or lying down until 2-3 hours after eating. WHAT FOODS ARE NOT RECOMMENDED? The following are some foods and drinks that may worsen your symptoms: Vegetables Tomatoes. Tomato juice. Tomato and spaghetti sauce. Chili peppers. Onion and garlic. Horseradish. Fruits Oranges, grapefruit, and lemon (fruit and juice). Meats High-fat meats, fish, and poultry. This includes hot dogs, ribs, ham, sausage, salami, and bacon. Dairy Whole milk and chocolate milk. Sour cream. Cream. Butter. Ice cream. Cream cheese.  Beverages Coffee and tea, with or without caffeine. Carbonated beverages or energy drinks. Condiments Hot sauce. Barbecue sauce.  Sweets/Desserts Chocolate and cocoa. Donuts. Peppermint and spearmint. Fats and Oils High-fat foods, including French fries and potato chips. Other Vinegar. Strong spices, such as black pepper, white pepper, red pepper, cayenne, curry powder, cloves, ginger, and chili powder. The items listed above may not be a complete list of foods and beverages to avoid. Contact your dietitian for more  information.   This information is not intended to replace advice given to you by your health care provider. Make sure you discuss any questions you have with your health care provider.   Document Released: 10/22/2005 Document Revised: 11/12/2014 Document Reviewed: 08/26/2013 Elsevier Interactive Patient Education 2016 Elsevier Inc.  

## 2016-05-04 NOTE — Progress Notes (Signed)
Name: Savannah Manning   MRN: [id number]    DOB: 06/10/1961   Date:05/04/2016       Progress Note  Subjective  Chief Complaint  Chief Complaint  Patient presents with  . Shoulder Pain    patient is here for a f/u regarding left shoulder pain  . Chills    patient stated that she gets really cold and has to lay down  . Fatigue    patient stated that she has a hx of anemia but she has really exhausted.  [name] GI Problem    patient stated that every time she eats her stomach burns.  . Advice Only    patient is trying to lose weight for health reasons and to get her insurance lower. she needs information on the gastric sleeve.    HPI  Shoulder pain: she states that over the past few weeks she has noticed progressively worsening of left shoulder pain. Described as a constant aching pain, better when not moving the shoulder and worse with movement such as abduction , internal rotation and when picking things up. She tried Meloxicam for a few days without improvement. Affecting her sleep at night because of pain. She has noticed pain radiating to palm of left hand and tingling on 3rd and 4th fingers intermittently. She also has some pain on left shoulder blade and sometimes neck feels stiff. She does not recall any injuries.   Fatigue/Chills: she just had labs done this morning.   GERD: she was out of Omeprazole for one week and symptoms of epigastric burning sensation was severe, she is back on Omeprazole for the past 2 days but is still afraid to eat. She is not very familiar with a GERD appropriate diet.   Obesity: she has been obese since childhood. She has tried multiple diet, she has gastric bypass in 2005. Weight before surgery was 314 lbs, she went down to 164 lbs, but has been gradually gaining weight again. She states her weight has gone up since she started to work from home. She had surgery done at [facility] and is considering having sleeve procedure. Discussed importance of life style  modification    Patient Active Problem List   Diagnosis Date Noted  . Allergic rhinitis 05/12/2015  . Benign essential HTN 05/12/2015  . Anxiety and depression 05/12/2015  . Insomnia, persistent 05/12/2015  . Dyslipidemia 05/12/2015  . Edema extremities 05/12/2015  . Gastro-esophageal reflux disease without esophagitis 05/12/2015  . Hypoglycemia 05/12/2015  . Eczema intertrigo 05/12/2015  . Anemia, iron deficiency 05/12/2015  . Migraine 05/12/2015  . Plantar fasciitis 05/12/2015  . Umbilical hernia without obstruction and without gangrene 05/12/2015  . Extreme obesity (HCC) 02/13/2008    Past Surgical History  Procedure Laterality Date  . Abdominal hysterectomy  2012  . Gastric bypass  2005    Family History  Problem Relation Age of Onset  . Hypertension Mother   . Cancer Father     prostate  . Heart disease Brother     Social History   Social History  . Marital Status: Single    Spouse Name: N/A  . Number of Children: N/A  . Years of Education: N/A   Occupational History  . Not on file.   Social History Main Topics  . Smoking status: Never Smoker   . Smokeless tobacco: Never Used  . Alcohol Use: No  . Drug Use: No  . Sexual Activity: Not Currently   Other Topics Concern  . Not on file  Social History Narrative     Current outpatient prescriptions:  .  Butalbital-APAP-Caffeine 50-300-40 MG CAPS, Take by mouth., Disp: , Rfl:  .  fluticasone (FLONASE) 50 MCG/ACT nasal spray, Place 2 sprays into both nostrils daily., Disp: 16 g, Rfl: 0 .  loratadine (CLARITIN) 10 MG tablet, Take by mouth., Disp: , Rfl:  .  metoprolol tartrate (LOPRESSOR) 25 MG tablet, Take 1 tablet (25 mg total) by mouth 2 (two) times daily., Disp: 30 tablet, Rfl: 0 .  omeprazole (PRILOSEC) 40 MG capsule, Take 1 capsule (40 mg total) by mouth daily., Disp: 30 capsule, Rfl: 0 .  ondansetron (ZOFRAN) 4 MG tablet, Take by mouth., Disp: , Rfl:  .  simvastatin (ZOCOR) 20 MG tablet, Take 1  tablet (20 mg total) by mouth daily at 6 PM., Disp: 90 tablet, Rfl: 1 .  traMADol (ULTRAM) 50 MG tablet, Take 1 tablet (50 mg total) by mouth every 8 (eight) hours as needed., Disp: 30 tablet, Rfl: 0 .  traZODone (DESYREL) 50 MG tablet, Take 1 tablet (50 mg total) by mouth at bedtime., Disp: 90 tablet, Rfl: 1  Allergies  Allergen Reactions  . Lisinopril Swelling    Facial Swelling  . Ace Inhibitors     angioedema  . Codeine   . Valsartan Other (See Comments)     ROS  Constitutional: Negative for fever or weight change.  Respiratory: Negative for cough and shortness of breath.   Cardiovascular: Negative for chest pain or palpitations.  Gastrointestinal: positive  for abdominal pain, no bowel changes.  Musculoskeletal: Negative for gait problem or joint swelling.  Skin: Negative for rash.  Neurological: Negative for dizziness or headache.  No other specific complaints in a complete review of systems (except as listed in HPI above).  Objective  Filed Vitals:   05/04/16 1145  Pulse: 59  Temp: 97.6 F (36.4 C)  TempSrc: Oral  Resp: 12  Weight: 256 lb 9.6 oz (116.393 kg)  SpO2: 96%    Body mass index is 40.18 kg/(m^2).  Physical Exam  Constitutional: Patient appears well-developed and well-nourished. Obese No distress.  HEENT: head atraumatic, normocephalic, pupils equal and reactive to light,neck supple, throat within normal limits Cardiovascular: Normal rate, regular rhythm and normal heart sounds.  No murmur heard. No BLE edema. Pulmonary/Chest: Effort normal and breath sounds normal. No respiratory distress. Abdominal: Soft.  There is epigastric  tenderness. Psychiatric: Patient has a normal mood and affect. behavior is normal. Judgment and thought content normal. Muscular Skeletal: pain with abduction of left shoulder  PHQ2/9: Depression screen [facility] 05/04/2016 03/22/2016 09/22/2015 05/13/2015  Decreased Interest 0 0 0 0  Down, Depressed, Hopeless 0 0 0 0  PHQ - 2  Score 0 0 0 0     Fall Risk: Fall Risk  05/04/2016 03/22/2016 09/22/2015 05/13/2015  Falls in the past year? No No No No      Functional Status Survey: Is the patient deaf or have difficulty hearing?: No Does the patient have difficulty seeing, even when wearing glasses/contacts?: No Does the patient have difficulty concentrating, remembering, or making decisions?: No Does the patient have difficulty walking or climbing stairs?: No Does the patient have difficulty dressing or bathing?: No Does the patient have difficulty doing errands alone such as visiting a doctor's office or shopping?: No    Assessment & Plan   1. Impingement syndrome of left shoulder  - Ambulatory referral to Orthopedic Surgery - MR Shoulder Left Wo Contrast; Future  2. History of bariatric  surgery  - Vitamin B12 - VITAMIN D 25 Hydroxy (Vit-D Deficiency, Fractures) - Ambulatory referral to General Surgery  3. Morbid obesity, unspecified obesity type (HCC)  - Ambulatory referral to General Surgery  4. Gastroesophageal reflux disease without esophagitis  Back on Omeprazole, discussed food choices and possibility of gastric ulcer anastomosis

## 2016-05-05 LAB — COMPREHENSIVE METABOLIC PANEL
ALT: 10 IU/L (ref 0–32)
AST: 13 IU/L (ref 0–40)
Albumin/Globulin Ratio: 1.4 (ref 1.2–2.2)
Albumin: 3.9 g/dL (ref 3.5–5.5)
Alkaline Phosphatase: 119 IU/L — ABNORMAL HIGH (ref 39–117)
BUN/Creatinine Ratio: 13 (ref 9–23)
BUN: 11 mg/dL (ref 6–24)
Bilirubin Total: 0.3 mg/dL (ref 0.0–1.2)
CO2: 24 mmol/L (ref 18–29)
Calcium: 9.2 mg/dL (ref 8.7–10.2)
Chloride: 99 mmol/L (ref 96–106)
Creatinine, Ser: 0.86 mg/dL (ref 0.57–1.00)
GFR calc Af Amer: 90 mL/min/{1.73_m2} (ref >59)
GFR calc non Af Amer: 78 mL/min/{1.73_m2} (ref >59)
Globulin, Total: 2.7 g/dL (ref 1.5–4.5)
Glucose: 92 mg/dL (ref 65–99)
Potassium: 4.5 mmol/L (ref 3.5–5.2)
Sodium: 139 mmol/L (ref 134–144)
Total Protein: 6.6 g/dL (ref 6.0–8.5)

## 2016-05-05 LAB — CBC WITH DIFFERENTIAL/PLATELET
Basophils Absolute: 0 10*3/uL (ref 0.0–0.2)
Basos: 0 %
EOS (ABSOLUTE): 0.1 10*3/uL (ref 0.0–0.4)
Eos: 1 %
Hematocrit: 35.3 % (ref 34.0–46.6)
Hemoglobin: 12 g/dL (ref 11.1–15.9)
Immature Grans (Abs): 0 10*3/uL (ref 0.0–0.1)
Immature Granulocytes: 0 %
Lymphocytes Absolute: 1.6 10*3/uL (ref 0.7–3.1)
Lymphs: 32 %
MCH: 28.8 pg (ref 26.6–33.0)
MCHC: 34 g/dL (ref 31.5–35.7)
MCV: 85 fL (ref 79–97)
Monocytes Absolute: 0.7 10*3/uL (ref 0.1–0.9)
Monocytes: 14 %
Neutrophils Absolute: 2.8 10*3/uL (ref 1.4–7.0)
Neutrophils: 53 %
Platelets: 263 10*3/uL (ref 150–379)
RBC: 4.16 x10E6/uL (ref 3.77–5.28)
RDW: 14.8 % (ref 12.3–15.4)
WBC: 5.2 10*3/uL (ref 3.4–10.8)

## 2016-05-05 LAB — HEMOGLOBIN A1C
Est. average glucose Bld gHb Est-mCnc: 128 mg/dL
Hgb A1c MFr Bld: 6.1 % — ABNORMAL HIGH (ref 4.8–5.6)

## 2016-05-05 LAB — LIPID PANEL
Chol/HDL Ratio: 3.2 ratio units (ref 0.0–4.4)
Cholesterol, Total: 205 mg/dL — ABNORMAL HIGH (ref 100–199)
HDL: 64 mg/dL (ref >39)
LDL Calculated: 112 mg/dL — ABNORMAL HIGH (ref 0–99)
Triglycerides: 143 mg/dL (ref 0–149)
VLDL Cholesterol Cal: 29 mg/dL (ref 5–40)

## 2016-05-08 LAB — VITAMIN B12: Vitamin B-12: 157 pg/mL — ABNORMAL LOW (ref 211–946)

## 2016-05-08 LAB — VITAMIN D 25 HYDROXY (VIT D DEFICIENCY, FRACTURES): Vit D, 25-Hydroxy: 10.3 ng/mL — ABNORMAL LOW (ref 30.0–100.0)

## 2016-05-08 LAB — SPECIMEN STATUS REPORT

## 2016-05-09 ENCOUNTER — Other Ambulatory Visit: Payer: Self-pay | Admitting: Family Medicine

## 2016-05-09 DIAGNOSIS — Z1231 Encounter for screening mammogram for malignant neoplasm of breast: Secondary | ICD-10-CM

## 2016-05-13 ENCOUNTER — Other Ambulatory Visit: Payer: Self-pay | Admitting: Family Medicine

## 2016-05-13 MED ORDER — VITAMIN D (ERGOCALCIFEROL) 1.25 MG (50000 UNIT) PO CAPS: 50000.0000 [IU] | ORAL_CAPSULE | ORAL | Status: DC

## 2016-05-15 ENCOUNTER — Telehealth: Payer: Self-pay | Admitting: Family Medicine

## 2016-05-15 DIAGNOSIS — M542 Cervicalgia: Secondary | ICD-10-CM

## 2016-05-15 DIAGNOSIS — M7542 Impingement syndrome of left shoulder: Secondary | ICD-10-CM

## 2016-05-15 MED ORDER — ACETAMINOPHEN-CODEINE #3 300-30 MG PO TABS: 1.0000 | ORAL_TABLET | Freq: Three times a day (TID) | ORAL | Status: DC | PRN

## 2016-05-15 NOTE — Telephone Encounter (Signed)
PT SAID THAT SHE IS IN SEVERE PAIN WITH HER SHOULDER AND HER MRI IS NOT TILL THE 18 . SAID THAT YOU HAD GIVEN HER SOMETHING FOR THE PAIN BEFORE AND WANTS TO KNOW IF YOU CAN GIVE HER SOMETHING AGAIN OR SUGGEST SOMETHING BESIDES TYLENOL FOR SHE IS TAKING THAT AND IT IS NOT DOING ANY GOOD. [facility] [place].

## 2016-05-15 NOTE — Telephone Encounter (Signed)
I am sorry. I don't feel comfortable starting her on any other form of controlled medication for pain, new guidelines only allow 5 days of pain medication for acute pain.

## 2016-05-15 NOTE — Telephone Encounter (Signed)
We can try Tylenol #3 must last until procedure

## 2016-05-15 NOTE — Telephone Encounter (Signed)
Patient brother stopped by to pick up her prescription today. When she received the prescription she realized that the Tylenol has Codeine in it and she is allergic to it. Patient would like to know if you could prescribe something different or if you want her to take the medication.

## 2016-05-16 ENCOUNTER — Encounter: Payer: BLUE CROSS/BLUE SHIELD | Admitting: Family Medicine

## 2016-05-16 MED ORDER — TRAMADOL HCL 50 MG PO TABS: 50.0000 mg | ORAL_TABLET | Freq: Three times a day (TID) | ORAL | Status: DC | PRN

## 2016-05-16 NOTE — Addendum Note (Signed)
Addended by: Alba CorySOWLES, Drayson Dorko F on: 05/16/2016 11:25 AM   Modules accepted: Orders, Medications

## 2016-05-16 NOTE — Telephone Encounter (Signed)
Patient did get the tylenol with codeine filled yesterday due to being in a lot of pain. She took one pill and something did upset her stomach but is not certain if it was the codeine in the medication or something she ate. Would like to know if you are able to prescribe Tramadol because she is afraid to continue taking the tylenol with codeine due to her being allergic. Please advice.

## 2016-05-16 NOTE — Telephone Encounter (Signed)
Patient informed and Melissa faxed prescription to local pharmacy.

## 2016-05-16 NOTE — Telephone Encounter (Signed)
Yes. Changed to Tramadol

## 2016-05-22 ENCOUNTER — Ambulatory Visit
Admission: RE | Admit: 2016-05-22 | Discharge: 2016-05-22 | Disposition: A | Discharge status: Home / Self Care | Payer: BLUE CROSS/BLUE SHIELD | Source: Ambulatory Visit | Attending: Family Medicine | Admitting: Family Medicine

## 2016-05-22 DIAGNOSIS — S46912A Strain of unspecified muscle, fascia and tendon at shoulder and upper arm level, left arm, initial encounter: Secondary | ICD-10-CM | POA: Diagnosis not present

## 2016-05-22 DIAGNOSIS — M25412 Effusion, left shoulder: Secondary | ICD-10-CM | POA: Diagnosis not present

## 2016-05-22 DIAGNOSIS — M659 Synovitis and tenosynovitis, unspecified: Secondary | ICD-10-CM | POA: Insufficient documentation

## 2016-05-22 DIAGNOSIS — M7542 Impingement syndrome of left shoulder: Secondary | ICD-10-CM | POA: Diagnosis not present

## 2016-05-22 DIAGNOSIS — S46012A Strain of muscle(s) and tendon(s) of the rotator cuff of left shoulder, initial encounter: Secondary | ICD-10-CM | POA: Diagnosis not present

## 2016-05-22 DIAGNOSIS — M19012 Primary osteoarthritis, left shoulder: Secondary | ICD-10-CM | POA: Insufficient documentation

## 2016-05-22 DIAGNOSIS — M75102 Unspecified rotator cuff tear or rupture of left shoulder, not specified as traumatic: Secondary | ICD-10-CM | POA: Insufficient documentation

## 2016-05-22 DIAGNOSIS — X58XXXA Exposure to other specified factors, initial encounter: Secondary | ICD-10-CM | POA: Diagnosis not present

## 2016-05-24 ENCOUNTER — Ambulatory Visit
Admission: RE | Admit: 2016-05-24 | Discharge: 2016-05-24 | Disposition: A | Discharge status: Home / Self Care | Payer: BLUE CROSS/BLUE SHIELD | Source: Ambulatory Visit | Attending: Family Medicine | Admitting: Family Medicine

## 2016-05-24 ENCOUNTER — Other Ambulatory Visit: Payer: Self-pay | Admitting: Family Medicine

## 2016-05-24 ENCOUNTER — Ambulatory Visit: Payer: BLUE CROSS/BLUE SHIELD

## 2016-05-24 ENCOUNTER — Ambulatory Visit: Admission: RE | Admit: 2016-05-24 | Payer: BLUE CROSS/BLUE SHIELD | Source: Ambulatory Visit

## 2016-05-24 DIAGNOSIS — Z1231 Encounter for screening mammogram for malignant neoplasm of breast: Secondary | ICD-10-CM | POA: Diagnosis not present

## 2016-05-24 DIAGNOSIS — R928 Other abnormal and inconclusive findings on diagnostic imaging of breast: Secondary | ICD-10-CM

## 2016-05-24 DIAGNOSIS — S46092A Other injury of muscle(s) and tendon(s) of the rotator cuff of left shoulder, initial encounter: Secondary | ICD-10-CM | POA: Diagnosis not present

## 2016-05-24 DIAGNOSIS — R921 Mammographic calcification found on diagnostic imaging of breast: Secondary | ICD-10-CM | POA: Diagnosis not present

## 2016-05-24 DIAGNOSIS — N6310 Unspecified lump in the right breast, unspecified quadrant: Secondary | ICD-10-CM

## 2016-05-24 DIAGNOSIS — M19012 Primary osteoarthritis, left shoulder: Secondary | ICD-10-CM | POA: Diagnosis not present

## 2016-05-30 DIAGNOSIS — M7582 Other shoulder lesions, left shoulder: Secondary | ICD-10-CM | POA: Diagnosis not present

## 2016-05-30 DIAGNOSIS — M75112 Incomplete rotator cuff tear or rupture of left shoulder, not specified as traumatic: Secondary | ICD-10-CM | POA: Insufficient documentation

## 2016-05-30 DIAGNOSIS — M7580 Other shoulder lesions, unspecified shoulder: Secondary | ICD-10-CM | POA: Insufficient documentation

## 2016-05-31 ENCOUNTER — Other Ambulatory Visit: Payer: Self-pay | Admitting: Family Medicine

## 2016-05-31 ENCOUNTER — Telehealth: Payer: Self-pay | Admitting: Family Medicine

## 2016-05-31 MED ORDER — OMEPRAZOLE 40 MG PO CPDR: 40.0000 mg | DELAYED_RELEASE_CAPSULE | Freq: Every day | ORAL | 0 refills | Status: DC

## 2016-05-31 NOTE — Telephone Encounter (Signed)
done

## 2016-05-31 NOTE — Telephone Encounter (Signed)
Pt informed

## 2016-06-04 ENCOUNTER — Other Ambulatory Visit: Payer: Self-pay | Admitting: Family Medicine

## 2016-06-04 ENCOUNTER — Ambulatory Visit
Admission: RE | Admit: 2016-06-04 | Discharge: 2016-06-04 | Disposition: A | Discharge status: Home / Self Care | Payer: BLUE CROSS/BLUE SHIELD | Source: Ambulatory Visit | Attending: Family Medicine | Admitting: Family Medicine

## 2016-06-04 ENCOUNTER — Telehealth: Payer: Self-pay

## 2016-06-04 DIAGNOSIS — R928 Other abnormal and inconclusive findings on diagnostic imaging of breast: Secondary | ICD-10-CM

## 2016-06-04 DIAGNOSIS — R921 Mammographic calcification found on diagnostic imaging of breast: Secondary | ICD-10-CM

## 2016-06-04 DIAGNOSIS — N6310 Unspecified lump in the right breast, unspecified quadrant: Secondary | ICD-10-CM

## 2016-06-04 NOTE — Telephone Encounter (Signed)
Please refer her to surgeon, ASAP. Please explain the situation and how anxious she is

## 2016-06-04 NOTE — Telephone Encounter (Signed)
Patient went for her Mammogram and was told to come in for an Ultrasound due to Calcifications on her breast and was told she needs a stereotatic biopsy but right now they are backed up. Patient is very concerned about her mammogram and having to having a biopsy with no appointment set up. Patient would like to know if you could set her up somewhere quicker?

## 2016-06-06 ENCOUNTER — Other Ambulatory Visit: Payer: Self-pay | Admitting: Family Medicine

## 2016-06-06 ENCOUNTER — Ambulatory Visit
Admission: RE | Admit: 2016-06-06 | Discharge: 2016-06-06 | Disposition: A | Discharge status: Home / Self Care | Payer: BLUE CROSS/BLUE SHIELD | Source: Ambulatory Visit | Attending: Family Medicine | Admitting: Family Medicine

## 2016-06-06 DIAGNOSIS — R921 Mammographic calcification found on diagnostic imaging of breast: Secondary | ICD-10-CM

## 2016-06-06 NOTE — Addendum Note (Signed)
Addended by: Cynda Familia on: 06/06/2016 08:58 AM   Modules accepted: Orders

## 2016-06-07 ENCOUNTER — Encounter
Admission: RE | Admit: 2016-06-07 | Discharge: 2016-06-07 | Disposition: A | Discharge status: Home / Self Care | Payer: BLUE CROSS/BLUE SHIELD | Source: Ambulatory Visit | Attending: Surgery | Admitting: Surgery

## 2016-06-07 DIAGNOSIS — Z01812 Encounter for preprocedural laboratory examination: Secondary | ICD-10-CM | POA: Diagnosis not present

## 2016-06-07 HISTORY — DX: Headache, unspecified: R51.9

## 2016-06-07 HISTORY — DX: Anemia, unspecified: D64.9

## 2016-06-07 HISTORY — DX: Unspecified asthma, uncomplicated: J45.909

## 2016-06-07 HISTORY — DX: Unspecified osteoarthritis, unspecified site: M19.90

## 2016-06-07 HISTORY — DX: Headache: R51

## 2016-06-07 HISTORY — DX: Gastro-esophageal reflux disease without esophagitis: K21.9

## 2016-06-07 HISTORY — DX: Essential (primary) hypertension: I10

## 2016-06-07 LAB — BASIC METABOLIC PANEL
Anion gap: 7 (ref 5–15)
BUN: 15 mg/dL (ref 6–20)
CO2: 28 mmol/L (ref 22–32)
Calcium: 9.2 mg/dL (ref 8.9–10.3)
Chloride: 102 mmol/L (ref 101–111)
Creatinine, Ser: 0.87 mg/dL (ref 0.44–1.00)
GFR calc Af Amer: >60 mL/min (ref >60)
GFR calc non Af Amer: >60 mL/min (ref >60)
Glucose, Bld: 98 mg/dL (ref 65–99)
Potassium: 3.8 mmol/L (ref 3.5–5.1)
Sodium: 137 mmol/L (ref 135–145)

## 2016-06-07 LAB — CBC
HCT: 34.6 % — ABNORMAL LOW (ref 35.0–47.0)
Hemoglobin: 12.1 g/dL (ref 12.0–16.0)
MCH: 29.7 pg (ref 26.0–34.0)
MCHC: 34.9 g/dL (ref 32.0–36.0)
MCV: 85.1 fL (ref 80.0–100.0)
Platelets: 239 10*3/uL (ref 150–440)
RBC: 4.07 MIL/uL (ref 3.80–5.20)
RDW: 14.5 % (ref 11.5–14.5)
WBC: 6 10*3/uL (ref 3.6–11.0)

## 2016-06-07 LAB — DIFFERENTIAL
Basophils Absolute: 0 10*3/uL (ref 0–0.1)
Basophils Relative: 1 %
Eosinophils Absolute: 0.1 10*3/uL (ref 0–0.7)
Eosinophils Relative: 2 %
Lymphocytes Relative: 41 %
Lymphs Abs: 2.4 10*3/uL (ref 1.0–3.6)
Monocytes Absolute: 0.5 10*3/uL (ref 0.2–0.9)
Monocytes Relative: 8 %
Neutro Abs: 2.9 10*3/uL (ref 1.4–6.5)
Neutrophils Relative %: 48 %

## 2016-06-07 NOTE — Patient Instructions (Signed)
  Your procedure is scheduled on:June 14, 2016 (Thursday) Report to Same Day Surgery 2nd floor Medical Mall To find out your arrival time please call [phone] between 1PM - 3PM on June 13, 2016 (Wednesday)  Remember: Instructions that are not followed completely may result in serious medical risk, up to and including death, or upon the discretion of your surgeon and anesthesiologist your surgery may need to be rescheduled.    _x___ 1. Do not eat food or drink liquids after midnight. No gum chewing or hard candies.     __x__ 2. No Alcohol for 24 hours before or after surgery.   __x__3. No Smoking for 24 prior to surgery.   ____  4. Bring all medications with you on the day of surgery if instructed.    __x__ 5. Notify your doctor if there is any change in your medical condition     (cold, fever, infections).     Do not wear jewelry, make-up, hairpins, clips or nail polish.  Do not wear lotions, powders, or perfumes. You may wear deodorant.  Do not shave 48 hours prior to surgery. Men may shave face and neck.  Do not bring valuables to the hospital.    [facility] is not responsible for any belongings or valuables.               Contacts, dentures or bridgework may not be worn into surgery.  Leave your suitcase in the car. After surgery it may be brought to your room.  For patients admitted to the hospital, discharge time is determined by your treatment team.   Patients discharged the day of surgery will not be allowed to drive home.    Please read over the following fact sheets that you were given:   [facility] Preparing for Surgery and or MRSA Information   _x___ Take these medicines the morning of surgery with A SIP OF WATER:    1. Metoprolol  2.Prilosec  3.  4.  5.  6.  ____ Fleet Enema (as directed)   _x___ Use CHG Soap or sage wipes as directed on instruction sheet   _x___ Use inhalers on the day of surgery and bring to hospital day of surgery  (Use Albuterol  inhaler the morning of surgery, and bring to hospital)  ____ Stop metformin 2 days prior to surgery    ____ Take 1/2 of usual insulin dose the night before surgery and none on the morning of  surgery         _x___ Stop aspirin or coumadin, or plavix (NO ASPIRIN)  _x__ Stop Anti-inflammatories such as Advil, Aleve, Ibuprofen, Motrin, Naproxen,          Naprosyn, Goodies powders or aspirin products. Ok to take Tylenol. (STOP MELOXICAM NOW)  x_ Stop supplements until after surgery. (Stop Vitamin B-12 now)   ____ Bring C-Pap to the hospital.

## 2016-06-08 ENCOUNTER — Telehealth: Payer: Self-pay | Admitting: Family Medicine

## 2016-06-10 NOTE — Telephone Encounter (Signed)
Looks like it was filled 06/06/2016. Please verify. Thank you

## 2016-06-11 NOTE — Telephone Encounter (Signed)
Please call pharmacy to verify

## 2016-06-12 ENCOUNTER — Telehealth: Payer: Self-pay | Admitting: Family Medicine

## 2016-06-12 ENCOUNTER — Telehealth: Payer: Self-pay | Admitting: *Deleted

## 2016-06-12 ENCOUNTER — Other Ambulatory Visit: Payer: Self-pay | Admitting: Family Medicine

## 2016-06-12 MED ORDER — ALBUTEROL SULFATE HFA 108 (90 BASE) MCG/ACT IN AERS: 2.0000 | INHALATION_SPRAY | Freq: Four times a day (QID) | RESPIRATORY_TRACT | 0 refills | Status: DC | PRN

## 2016-06-12 NOTE — Telephone Encounter (Signed)
Patient was referred from Dr. Carlynn PurlSowles for an appointment to see you regarding an abnormal mammogram and attempted stereotactic biopsy. Patient called to cancel appointment, she voiced her concerns on the need for the appointment and she wanted to contact Dr. Carlynn PurlSowles office for clarification on the need for the appointment to see you.

## 2016-06-12 NOTE — Telephone Encounter (Signed)
[facility]

## 2016-06-12 NOTE — Telephone Encounter (Signed)
Sent!

## 2016-06-14 ENCOUNTER — Ambulatory Visit: Payer: BLUE CROSS/BLUE SHIELD | Admitting: Anesthesiology

## 2016-06-14 ENCOUNTER — Encounter
Admission: RE | Disposition: A | Discharge status: Home / Self Care | Payer: Self-pay | Source: Ambulatory Visit | Attending: Surgery

## 2016-06-14 ENCOUNTER — Ambulatory Visit
Admission: RE | Admit: 2016-06-14 | Discharge: 2016-06-14 | Disposition: A | Discharge status: Home / Self Care | Payer: BLUE CROSS/BLUE SHIELD | Source: Ambulatory Visit | Attending: Surgery | Admitting: Surgery

## 2016-06-14 DIAGNOSIS — I10 Essential (primary) hypertension: Secondary | ICD-10-CM | POA: Diagnosis not present

## 2016-06-14 DIAGNOSIS — J309 Allergic rhinitis, unspecified: Secondary | ICD-10-CM | POA: Insufficient documentation

## 2016-06-14 DIAGNOSIS — I499 Cardiac arrhythmia, unspecified: Secondary | ICD-10-CM | POA: Insufficient documentation

## 2016-06-14 DIAGNOSIS — Z90710 Acquired absence of both cervix and uterus: Secondary | ICD-10-CM | POA: Insufficient documentation

## 2016-06-14 DIAGNOSIS — M25512 Pain in left shoulder: Secondary | ICD-10-CM | POA: Diagnosis not present

## 2016-06-14 DIAGNOSIS — G8918 Other acute postprocedural pain: Secondary | ICD-10-CM | POA: Diagnosis not present

## 2016-06-14 DIAGNOSIS — G43909 Migraine, unspecified, not intractable, without status migrainosus: Secondary | ICD-10-CM | POA: Diagnosis not present

## 2016-06-14 DIAGNOSIS — M75112 Incomplete rotator cuff tear or rupture of left shoulder, not specified as traumatic: Secondary | ICD-10-CM | POA: Insufficient documentation

## 2016-06-14 DIAGNOSIS — Z888 Allergy status to other drugs, medicaments and biological substances status: Secondary | ICD-10-CM | POA: Diagnosis not present

## 2016-06-14 DIAGNOSIS — G4700 Insomnia, unspecified: Secondary | ICD-10-CM | POA: Insufficient documentation

## 2016-06-14 DIAGNOSIS — K219 Gastro-esophageal reflux disease without esophagitis: Secondary | ICD-10-CM | POA: Diagnosis not present

## 2016-06-14 DIAGNOSIS — M7542 Impingement syndrome of left shoulder: Secondary | ICD-10-CM | POA: Insufficient documentation

## 2016-06-14 DIAGNOSIS — Z9884 Bariatric surgery status: Secondary | ICD-10-CM | POA: Insufficient documentation

## 2016-06-14 DIAGNOSIS — Z79899 Other long term (current) drug therapy: Secondary | ICD-10-CM | POA: Diagnosis not present

## 2016-06-14 DIAGNOSIS — Z5333 Arthroscopic surgical procedure converted to open procedure: Secondary | ICD-10-CM | POA: Diagnosis not present

## 2016-06-14 DIAGNOSIS — M7582 Other shoulder lesions, left shoulder: Secondary | ICD-10-CM | POA: Diagnosis not present

## 2016-06-14 DIAGNOSIS — E162 Hypoglycemia, unspecified: Secondary | ICD-10-CM | POA: Insufficient documentation

## 2016-06-14 DIAGNOSIS — Z885 Allergy status to narcotic agent status: Secondary | ICD-10-CM | POA: Diagnosis not present

## 2016-06-14 DIAGNOSIS — M24012 Loose body in left shoulder: Secondary | ICD-10-CM | POA: Insufficient documentation

## 2016-06-14 DIAGNOSIS — M19012 Primary osteoarthritis, left shoulder: Secondary | ICD-10-CM | POA: Insufficient documentation

## 2016-06-14 DIAGNOSIS — M75102 Unspecified rotator cuff tear or rupture of left shoulder, not specified as traumatic: Secondary | ICD-10-CM | POA: Diagnosis not present

## 2016-06-14 DIAGNOSIS — Z6839 Body mass index (BMI) 39.0-39.9, adult: Secondary | ICD-10-CM | POA: Insufficient documentation

## 2016-06-14 HISTORY — PX: SHOULDER ARTHROSCOPY WITH OPEN ROTATOR CUFF REPAIR: SHX6092

## 2016-06-14 SURGERY — ARTHROSCOPY, SHOULDER WITH REPAIR, ROTATOR CUFF, OPEN
Anesthesia: General | Site: Shoulder | Laterality: Left | Wound class: Clean

## 2016-06-14 MED ORDER — BUPIVACAINE-EPINEPHRINE 0.5% -1:200000 IJ SOLN
INTRAMUSCULAR | Status: DC | PRN
  Administered 2016-06-14: 22 mL

## 2016-06-14 MED ORDER — METOCLOPRAMIDE HCL 5 MG/ML IJ SOLN
5.0000 mg | Freq: Three times a day (TID) | INTRAMUSCULAR | Status: DC | PRN
Start: 2016-06-14 — End: 2016-06-14

## 2016-06-14 MED ORDER — CEFAZOLIN SODIUM-DEXTROSE 2-4 GM/100ML-% IV SOLN
INTRAVENOUS | Status: AC
  Filled 2016-06-14: qty 100

## 2016-06-14 MED ORDER — EPINEPHRINE HCL 1 MG/ML IJ SOLN
INTRAMUSCULAR | Status: DC | PRN
  Administered 2016-06-14: 2 mL

## 2016-06-14 MED ORDER — EPINEPHRINE HCL 1 MG/ML IJ SOLN
INTRAMUSCULAR | Status: AC
  Filled 2016-06-14: qty 2

## 2016-06-14 MED ORDER — BUPIVACAINE-EPINEPHRINE (PF) 0.5% -1:200000 IJ SOLN
INTRAMUSCULAR | Status: AC
  Filled 2016-06-14: qty 30

## 2016-06-14 MED ORDER — PROPOFOL 10 MG/ML IV BOLUS
INTRAVENOUS | Status: DC | PRN
  Administered 2016-06-14: 170 mg via INTRAVENOUS

## 2016-06-14 MED ORDER — OXYCODONE HCL 5 MG PO TABS: 5.0000 mg | ORAL_TABLET | Freq: Once | ORAL | Status: DC | PRN

## 2016-06-14 MED ORDER — OXYCODONE HCL 5 MG PO TABS: 5.0000 mg | ORAL_TABLET | ORAL | 0 refills | Status: DC | PRN

## 2016-06-14 MED ORDER — FENTANYL CITRATE (PF) 100 MCG/2ML IJ SOLN: 25.0000 ug | INTRAMUSCULAR | Status: DC | PRN

## 2016-06-14 MED ORDER — PROMETHAZINE HCL 25 MG/ML IJ SOLN: 6.2500 mg | INTRAMUSCULAR | Status: DC | PRN

## 2016-06-14 MED ORDER — METOCLOPRAMIDE HCL 10 MG PO TABS
5.0000 mg | ORAL_TABLET | Freq: Three times a day (TID) | ORAL | Status: DC | PRN
Start: 2016-06-14 — End: 2016-06-14

## 2016-06-14 MED ORDER — MEPERIDINE HCL 25 MG/ML IJ SOLN: 6.2500 mg | INTRAMUSCULAR | Status: DC | PRN

## 2016-06-14 MED ORDER — FENTANYL CITRATE (PF) 100 MCG/2ML IJ SOLN
INTRAMUSCULAR | Status: DC | PRN
  Administered 2016-06-14: 50 ug via INTRAVENOUS

## 2016-06-14 MED ORDER — LIDOCAINE HCL (CARDIAC) 20 MG/ML IV SOLN
INTRAVENOUS | Status: DC | PRN
  Administered 2016-06-14: 100 mg via INTRAVENOUS

## 2016-06-14 MED ORDER — BUPIVACAINE HCL (PF) 0.5 % IJ SOLN
INTRAMUSCULAR | Status: DC | PRN
  Administered 2016-06-14: 25 mL via PERINEURAL

## 2016-06-14 MED ORDER — LACTATED RINGERS IV SOLN
INTRAVENOUS | Status: DC
  Administered 2016-06-14 (×2): via INTRAVENOUS

## 2016-06-14 MED ORDER — OXYCODONE HCL 5 MG PO TABS: 5.0000 mg | ORAL_TABLET | ORAL | Status: DC | PRN

## 2016-06-14 MED ORDER — EPHEDRINE SULFATE 50 MG/ML IJ SOLN
INTRAMUSCULAR | Status: DC | PRN
  Administered 2016-06-14: 10 mg via INTRAVENOUS

## 2016-06-14 MED ORDER — CEFAZOLIN SODIUM-DEXTROSE 2-4 GM/100ML-% IV SOLN
2.0000 g | Freq: Once | INTRAVENOUS | Status: AC
Start: 2016-06-14 — End: 2016-06-14
  Administered 2016-06-14: 2 g via INTRAVENOUS

## 2016-06-14 MED ORDER — DEXAMETHASONE SODIUM PHOSPHATE 10 MG/ML IJ SOLN
INTRAMUSCULAR | Status: DC | PRN
  Administered 2016-06-14: 5 mg via INTRAVENOUS

## 2016-06-14 MED ORDER — POTASSIUM CHLORIDE IN NACL 20-0.9 MEQ/L-% IV SOLN
INTRAVENOUS | Status: DC
  Filled 2016-06-14: qty 1000

## 2016-06-14 MED ORDER — ONDANSETRON HCL 4 MG/2ML IJ SOLN: 4.0000 mg | Freq: Four times a day (QID) | INTRAMUSCULAR | Status: DC | PRN

## 2016-06-14 MED ORDER — ROCURONIUM BROMIDE 100 MG/10ML IV SOLN
INTRAVENOUS | Status: DC | PRN
  Administered 2016-06-14: 50 mg via INTRAVENOUS

## 2016-06-14 MED ORDER — ONDANSETRON HCL 4 MG/2ML IJ SOLN
INTRAMUSCULAR | Status: DC | PRN
  Administered 2016-06-14: 4 mg via INTRAVENOUS

## 2016-06-14 MED ORDER — ONDANSETRON HCL 4 MG PO TABS: 4.0000 mg | ORAL_TABLET | Freq: Four times a day (QID) | ORAL | Status: DC | PRN

## 2016-06-14 MED ORDER — ONDANSETRON 4 MG PO TBDP: 4.0000 mg | ORAL_TABLET | Freq: Three times a day (TID) | ORAL | 0 refills | Status: DC | PRN

## 2016-06-14 MED ORDER — OXYCODONE HCL 5 MG/5ML PO SOLN: 5.0000 mg | Freq: Once | ORAL | Status: DC | PRN

## 2016-06-14 MED ORDER — MIDAZOLAM HCL 2 MG/2ML IJ SOLN
INTRAMUSCULAR | Status: DC | PRN
  Administered 2016-06-14: 2 mg via INTRAVENOUS

## 2016-06-14 MED ORDER — SUGAMMADEX SODIUM 200 MG/2ML IV SOLN
INTRAVENOUS | Status: DC | PRN
  Administered 2016-06-14: 225.8 mg via INTRAVENOUS

## 2016-06-14 SURGICAL SUPPLY — 46 items
ANCHOR JUGGERKNOT WTAP NDL 2.9 (Anchor) ×4 IMPLANT
ANCHOR SUT QUATTRO KNTLS 4.5 (Anchor) ×4 IMPLANT
BIT DRILL JUGRKNT W/NDL BIT2.9 (DRILL) ×1 IMPLANT
BLADE FULL RADIUS 3.5 (BLADE) ×2 IMPLANT
BLADE SHAVER 4.5X7 STR FR (MISCELLANEOUS) IMPLANT
BUR ACROMIONIZER 4.0 (BURR) ×2 IMPLANT
BUR BR 5.5 WIDE MOUTH (BURR) IMPLANT
CANNULA SHAVER 8MMX76MM (CANNULA) ×2 IMPLANT
CHLORAPREP W/TINT 26ML (MISCELLANEOUS) ×4 IMPLANT
COVER MAYO STAND STRL (DRAPES) ×2 IMPLANT
DECANTER SPIKE VIAL GLASS SM (MISCELLANEOUS) ×2 IMPLANT
DRAPE IMP U-DRAPE 54X76 (DRAPES) ×4 IMPLANT
DRILL JUGGERKNOT W/NDL BIT 2.9 (DRILL) ×2
DRSG OPSITE POSTOP 4X8 (GAUZE/BANDAGES/DRESSINGS) ×2 IMPLANT
ELECT REM PT RETURN 9FT ADLT (ELECTROSURGICAL) ×2
ELECTRODE REM PT RTRN 9FT ADLT (ELECTROSURGICAL) ×1 IMPLANT
GAUZE PETRO XEROFOAM 1X8 (MISCELLANEOUS) ×2 IMPLANT
GAUZE SPONGE 4X4 12PLY STRL (GAUZE/BANDAGES/DRESSINGS) ×4 IMPLANT
GLOVE BIO SURGEON STRL SZ7.5 (GLOVE) ×4 IMPLANT
GLOVE BIO SURGEON STRL SZ8 (GLOVE) ×4 IMPLANT
GLOVE BIOGEL PI IND STRL 8 (GLOVE) ×1 IMPLANT
GLOVE BIOGEL PI INDICATOR 8 (GLOVE) ×1
GLOVE INDICATOR 8.0 STRL GRN (GLOVE) ×2 IMPLANT
GOWN STRL REUS W/ TWL LRG LVL3 (GOWN DISPOSABLE) ×2 IMPLANT
GOWN STRL REUS W/ TWL XL LVL3 (GOWN DISPOSABLE) ×1 IMPLANT
GOWN STRL REUS W/TWL LRG LVL3 (GOWN DISPOSABLE) ×2
GOWN STRL REUS W/TWL XL LVL3 (GOWN DISPOSABLE) ×1
GRASPER SUT 15 45D LOW PRO (SUTURE) IMPLANT
IV LACTATED RINGER IRRG 3000ML (IV SOLUTION) ×2
IV LR IRRIG 3000ML ARTHROMATIC (IV SOLUTION) ×2 IMPLANT
MANIFOLD NEPTUNE II (INSTRUMENTS) ×2 IMPLANT
MASK FACE SPIDER DISP (MASK) ×2 IMPLANT
MAT BLUE FLOOR 46X72 FLO (MISCELLANEOUS) ×2 IMPLANT
NEEDLE REVERSE CUT 1/2 CRC (NEEDLE) IMPLANT
PACK ARTHROSCOPY SHOULDER (MISCELLANEOUS) ×2 IMPLANT
SLING ARM LRG DEEP (SOFTGOODS) IMPLANT
SLING ULTRA II LG (MISCELLANEOUS) ×2 IMPLANT
STAPLER SKIN PROX 35W (STAPLE) ×2 IMPLANT
STRAP SAFETY BODY (MISCELLANEOUS) ×2 IMPLANT
SUT ETHIBOND 0 MO6 C/R (SUTURE) ×2 IMPLANT
SUT VIC AB 2-0 CT1 27 (SUTURE) ×3
SUT VIC AB 2-0 CT1 TAPERPNT 27 (SUTURE) ×3 IMPLANT
TAPE MICROFOAM 4IN (TAPE) ×2 IMPLANT
TUBING ARTHRO INFLOW-ONLY STRL (TUBING) ×2 IMPLANT
TUBING CONNECTING 10 (TUBING) ×2 IMPLANT
WAND HAND CNTRL MULTIVAC 90 (MISCELLANEOUS) ×2 IMPLANT

## 2016-06-14 NOTE — Anesthesia Procedure Notes (Signed)
Anesthesia Regional Block:  Interscalene brachial plexus block  Pre-Anesthetic Checklist: ,, timeout performed, Correct Patient, Correct Site, Correct Laterality, Correct Procedure, Correct Position, site marked, Risks and benefits discussed,  Surgical consent,  Pre-op evaluation,  At surgeon's request and post-op pain management  Laterality: Left  Prep: chloraprep       Needles:  Injection technique: Single-shot  Needle Type: Stimiplex     Needle Length: 5cm 5 cm Needle Gauge: 22 and 22 G    Additional Needles:  Procedures: ultrasound guided (picture in chart) Interscalene brachial plexus block Narrative:  Start time: 06/14/2016 7:30 AM End time: 06/14/2016 7:45 AM Injection made incrementally with aspirations every 5 mL.  Performed by: Personally  Anesthesiologist: Remonia Otte  Additional Notes: Functioning IV was confirmed and monitors were applied.  A 50mm 22ga Stimuplex needle was used. Sterile prep and drape,hand hygiene and sterile gloves were used.  Negative aspiration and negative test dose prior to incremental administration of local anesthetic. The patient tolerated the procedure well.

## 2016-06-14 NOTE — Anesthesia Postprocedure Evaluation (Signed)
Anesthesia Post Note  Patient: Savannah HartshornVondra D Manning  Procedure(s) Performed: Procedure(s) (LRB): SHOULDER ARTHROSCOPY WITH OPEN ROTATOR CUFF REPAIR, DECOMPRESSION, EXCISION LOOSE BODY, DEBRIDEMENT (Left)  Patient location during evaluation: PACU Anesthesia Type: General Level of consciousness: awake and alert and oriented Pain management: pain level controlled Vital Signs Assessment: post-procedure vital signs reviewed and stable Respiratory status: spontaneous breathing, nonlabored ventilation and respiratory function stable Cardiovascular status: blood pressure returned to baseline and stable Postop Assessment: no signs of nausea or vomiting Anesthetic complications: no    Last Vitals:  Vitals:   06/14/16 0940 06/14/16 0955  BP: (!) 159/94 (!) 162/95  Pulse: 68   Resp: (!) 9 13  Temp:      Last Pain:  Vitals:   06/14/16 0955  TempSrc:   PainSc: 0-No pain                 Larie Mathes

## 2016-06-14 NOTE — H&P (Signed)
Paper H&P to be scanned into permanent record. H&P reviewed. No changes. 

## 2016-06-14 NOTE — Anesthesia Procedure Notes (Signed)
Procedure Name: Intubation Date/Time: 06/14/2016 7:04 AM Performed by: Almeta MonasFLETCHER, Eduardo Honor Pre-anesthesia Checklist: Patient identified, Emergency Drugs available, Suction available, Patient being monitored and Timeout performed Patient Re-evaluated:Patient Re-evaluated prior to inductionOxygen Delivery Method: Circle system utilized Preoxygenation: Pre-oxygenation with 100% oxygen Intubation Type: IV induction Ventilation: Mask ventilation without difficulty Laryngoscope Size: Mac and 3 Grade View: Grade I Tube type: Oral Tube size: 7.0 mm Number of attempts: 1 Airway Equipment and Method: Patient positioned with wedge pillow and Stylet Placement Confirmation: ETT inserted through vocal cords under direct vision,  positive ETCO2 and breath sounds checked- equal and bilateral Secured at: 21 cm Tube secured with: Tape Dental Injury: Teeth and Oropharynx as per pre-operative assessment

## 2016-06-14 NOTE — Anesthesia Preprocedure Evaluation (Signed)
Anesthesia Evaluation  Patient identified by MRN, date of birth, ID band Patient awake    Reviewed: Allergy & Precautions, NPO status , Patient's Chart, lab work & pertinent test results  History of Anesthesia Complications Negative for: history of anesthetic complications  Airway Mallampati: III  TM Distance: >3 FB Neck ROM: Full    Dental no notable dental hx.    Pulmonary asthma (mild intermittent, has not used inhaler for several months) ,    breath sounds clear to auscultation- rhonchi (-) wheezing      Cardiovascular Exercise Tolerance: Good hypertension, Pt. on home beta blockers (-) CAD and (-) Past MI  Rhythm:Regular Rate:Normal - Systolic murmurs and - Diastolic murmurs    Neuro/Psych  Headaches, Anxiety    GI/Hepatic Neg liver ROS, GERD  ,  Endo/Other  negative endocrine ROSneg diabetes  Renal/GU negative Renal ROS     Musculoskeletal  (+) Arthritis , Osteoarthritis,    Abdominal (+) + obese,   Peds  Hematology   Anesthesia Other Findings Past Medical History: No date: Anemia No date: Arthritis No date: Asthma No date: GERD (gastroesophageal reflux disease) No date: Headache No date: Hypertension   Reproductive/Obstetrics                             Anesthesia Physical Anesthesia Plan  ASA: II  Anesthesia Plan: General   Post-op Pain Management:  Regional for Post-op pain   Induction: Intravenous  Airway Management Planned: Oral ETT  Additional Equipment:   Intra-op Plan:   Post-operative Plan: Extubation in OR  Informed Consent: I have reviewed the patients History and Physical, chart, labs and discussed the procedure including the risks, benefits and alternatives for the proposed anesthesia with the patient or authorized representative who has indicated his/her understanding and acceptance.   Dental advisory given  Plan Discussed with: Anesthesiologist and  CRNA  Anesthesia Plan Comments:         Anesthesia Quick Evaluation

## 2016-06-14 NOTE — Op Note (Signed)
06/14/2016  9:34 AM  Patient:   Savannah Manning  Pre-Op Diagnosis:   Impingement/tendinopathy with partial thickness rotator cuff tear, left shoulder.  Postoperative diagnosis: Impingement/tendinopathy with partial-thickness rotator cuff tear and degenerative joint disease with loose body, left shoulder.  Procedure: Limited arthroscopic debridement with excision of loose body, arthroscopic subacromial decompression, and mini-open rotator cuff repair, left shoulder.  Anesthesia: General endotracheal with interscalene block placed preoperatively by the anesthesiologist.  Surgeon:   Maryagnes AmosJ. Jeffrey Madalee Altmann, MD  Assistant:   Horris LatinoLance McGhee, PA-C; Renee PainStephanie Racz, PA-S  Findings: As above. There were grade 3 chondromalacial changes involving the central portion of the humeral head, and grade 2-3 chondromalacial changes involving the glenoid. The biceps tendon had chronically torn and retracted. There was a delaminating-type articular surface partial thickness tear of the supraspinatus. The subscapularis tendon was in satisfactory condition. There was mild fraying of the labrum anteriorly without frank detachment.  Complications: None  Fluids:   700 cc  Estimated blood loss: 5 cc  Tourniquet time: None  Drains: None  Closure: Staples   Brief clinical note: The patient is a 55 year old female with a history of left shoulder pain. The patient's symptoms have progressed despite medications, activity modification, etc. The patient's history and examination are consistent with impingement/tendinopathy with a rotator cuff tear. These findings were confirmed by MRI scan. The patient presents at this time for definitive management of these shoulder symptoms.  Procedure: The patient was brought into the operating room and lain in the supine position. After adequate IV sedation was achieved, the patient underwent placement of an interscalene block by the anesthesiologist. The patient  then underwent general endotracheal intubation and anesthesia before being repositioned in the beach chair position using the beach chair positioner. The left shoulder and upper extremity were prepped with ChloraPrep solution before being draped sterilely. Preoperative antibiotics were administered. A timeout was performed to confirm the proper surgical site before the expected portal sites and incision site were injected with 0.5% Sensorcaine with epinephrine. A posterior portal was created and the glenohumeral joint thoroughly inspected with the findings as described above. An anterior portal was created using an outside-in technique. The labrum and rotator cuff were further probed, again confirming the above-noted findings. The loose piece of articular cartilage was removed using a grasper before areas of synovitis and the frayed margin of the rotator cuff tear were debrided back to stable margins using the full-radius resector. The ArthroCare wand was inserted and used to obtain hemostasis as well as to "anneal" the labrum anteriorly. The instruments were removed from the joint after suctioning the excess fluid.  The camera was repositioned through the posterior portal into the subacromial space. A separate lateral portal was created using an outside-in technique. The 3.5 mm full-radius resector was introduced and used to perform a subtotal bursectomy. The ArthroCare wand was then inserted and used to remove the periosteal tissue off the undersurface of the anterior third of the acromion as well as to recess the coracoacromial ligament from its attachment along the anterior and lateral margins of the acromion. The 4.0 mm acromionizing bur was introduced and used to complete the decompression by removing the undersurface of the anterior third of the acromion. The full radius resector was reintroduced to remove any residual bony debris before the ArthroCare wand was reintroduced to obtain hemostasis. The  instruments were then removed from the subacromial space after suctioning the excess fluid.  An approximately 4-5 cm incision was made over the anterolateral aspect of  the shoulder beginning at the anterolateral corner of the acromion and extending distally in line with the bicipital groove. This incision was carried down through the subcutaneous tissues to expose the deltoid fascia. The raphae between the anterior and middle thirds was identified and this plane developed to provide access into the subacromial space. Additional bursal tissues were debrided sharply using Metzenbaum scissors. The rotator cuff tear was readily identified by palpation. The bursal fibers of the lateral portion of the supraspinatus were released to create a full-thickness tear and thereby permit better visualization of the articular surface portion of the tear. The margins of the torn portion of the rotator cuff were debrided sharply with a #15 blade and the exposed greater tuberosity roughened with a rongeur. The tear was repaired using two Biomet 2.9 mm JuggerKnot anchors. These sutures were then brought back laterally and secured using two [place] QuatroLink anchors to create a two-layer closure. An apparent watertight closure was obtained.  The wound was copiously irrigated with sterile saline solution before the deltoid raphae was reapproximated using 2-0 Vicryl interrupted sutures. The subcutaneous tissues were closed in two layers using 2-0 Vicryl interrupted sutures before the skin was closed using staples. The portal sites also were closed using staples. A sterile bulky dressing was applied to the shoulder before the arm was placed into a shoulder immobilizer. The patient was then awakened, extubated, and returned to the recovery room in satisfactory condition after tolerating the procedure well.

## 2016-06-14 NOTE — Transfer of Care (Signed)
Immediate Anesthesia Transfer of Care Note  Patient: Savannah Manning  Procedure(s) Performed: Procedure(s): SHOULDER ARTHROSCOPY WITH OPEN ROTATOR CUFF REPAIR, DECOMPRESSION, EXCISION LOOSE BODY, DEBRIDEMENT (Left)  Patient Location: PACU  Anesthesia Type:General  Level of Consciousness: sedated  Airway & Oxygen Therapy: Patient Spontanous Breathing and Patient connected to face mask oxygen  Post-op Assessment: Report given to RN and Post -op Vital signs reviewed and stable  Post vital signs: Reviewed and stable  Last Vitals:  Vitals:   06/14/16 0602  BP: (!) 176/99  Pulse: (!) 48  Resp: 14  Temp: 36.4 C    Last Pain:  Vitals:   06/14/16 0602  TempSrc: Oral         Complications: No apparent anesthesia complications

## 2016-06-14 NOTE — Discharge Instructions (Addendum)
Keep dressing dry and intact.  °May shower after dressing changed on post-op day #4 (Monday).  °Cover staples with Band-Aids after drying off. °Apply ice frequently to shoulder. °Keep shoulder immobilizer on at all times except may remove for bathing purposes. °Follow-up in 10-14 days or as scheduled.AMBULATORY SURGERY  °DISCHARGE INSTRUCTIONS ° ° °1) The drugs that you were given will stay in your system until tomorrow so for the next 24 hours you should not: ° °A) Drive an automobile °B) Make any legal decisions °C) Drink any alcoholic beverage ° ° °2) You may resume regular meals tomorrow.  Today it is better to start with liquids and gradually work up to solid foods. ° °You may eat anything you prefer, but it is better to start with liquids, then soup and crackers, and gradually work up to solid foods. ° ° °3) Please notify your doctor immediately if you have any unusual bleeding, trouble breathing, redness and pain at the surgery site, drainage, fever, or pain not relieved by medication. ° ° ° °4) Additional Instructions: ° ° ° ° ° ° ° °Please contact your physician with any problems or Same Day Surgery at [phone], Monday through Friday 6 am to 4 pm, or [place] [facility] [place] Main number at [phone]. °

## 2016-06-19 NOTE — Telephone Encounter (Signed)
COMPLETED

## 2016-06-20 DIAGNOSIS — M25512 Pain in left shoulder: Secondary | ICD-10-CM | POA: Diagnosis not present

## 2016-06-25 ENCOUNTER — Ambulatory Visit: Payer: BLUE CROSS/BLUE SHIELD | Admitting: Family Medicine

## 2016-07-02 ENCOUNTER — Ambulatory Visit: Payer: Self-pay | Admitting: General Surgery

## 2016-07-02 DIAGNOSIS — M25512 Pain in left shoulder: Secondary | ICD-10-CM | POA: Diagnosis not present

## 2016-07-05 DIAGNOSIS — M25512 Pain in left shoulder: Secondary | ICD-10-CM | POA: Diagnosis not present

## 2016-07-08 ENCOUNTER — Other Ambulatory Visit: Payer: Self-pay | Admitting: Family Medicine

## 2016-07-10 DIAGNOSIS — M25512 Pain in left shoulder: Secondary | ICD-10-CM | POA: Diagnosis not present

## 2016-07-12 DIAGNOSIS — M25512 Pain in left shoulder: Secondary | ICD-10-CM | POA: Diagnosis not present

## 2016-07-16 DIAGNOSIS — Z1239 Encounter for other screening for malignant neoplasm of breast: Secondary | ICD-10-CM | POA: Diagnosis not present

## 2016-07-16 DIAGNOSIS — M25512 Pain in left shoulder: Secondary | ICD-10-CM | POA: Diagnosis not present

## 2016-07-16 DIAGNOSIS — Z01419 Encounter for gynecological examination (general) (routine) without abnormal findings: Secondary | ICD-10-CM | POA: Diagnosis not present

## 2016-07-18 DIAGNOSIS — M25512 Pain in left shoulder: Secondary | ICD-10-CM | POA: Diagnosis not present

## 2016-07-23 DIAGNOSIS — M25512 Pain in left shoulder: Secondary | ICD-10-CM | POA: Diagnosis not present

## 2016-07-25 DIAGNOSIS — M25512 Pain in left shoulder: Secondary | ICD-10-CM | POA: Diagnosis not present

## 2016-07-30 ENCOUNTER — Encounter: Payer: Self-pay | Admitting: Family Medicine

## 2016-07-30 DIAGNOSIS — M25512 Pain in left shoulder: Secondary | ICD-10-CM | POA: Diagnosis not present

## 2016-08-01 DIAGNOSIS — M25512 Pain in left shoulder: Secondary | ICD-10-CM | POA: Diagnosis not present

## 2016-08-03 ENCOUNTER — Ambulatory Visit: Payer: BLUE CROSS/BLUE SHIELD | Admitting: Family Medicine

## 2016-08-07 ENCOUNTER — Encounter: Payer: Self-pay | Admitting: *Deleted

## 2016-08-08 DIAGNOSIS — M25512 Pain in left shoulder: Secondary | ICD-10-CM | POA: Diagnosis not present

## 2016-08-10 DIAGNOSIS — M25512 Pain in left shoulder: Secondary | ICD-10-CM | POA: Diagnosis not present

## 2016-08-13 DIAGNOSIS — M25512 Pain in left shoulder: Secondary | ICD-10-CM | POA: Diagnosis not present

## 2016-08-15 DIAGNOSIS — M25512 Pain in left shoulder: Secondary | ICD-10-CM | POA: Diagnosis not present

## 2016-08-20 DIAGNOSIS — M25512 Pain in left shoulder: Secondary | ICD-10-CM | POA: Diagnosis not present

## 2016-08-22 DIAGNOSIS — M25512 Pain in left shoulder: Secondary | ICD-10-CM | POA: Diagnosis not present

## 2016-08-27 ENCOUNTER — Telehealth: Payer: Self-pay | Admitting: Family Medicine

## 2016-08-28 NOTE — Telephone Encounter (Signed)
Patient requesting refill of Omeprazole.

## 2016-08-28 NOTE — Telephone Encounter (Signed)
Dr Carlynn PurlSowles I spoke with Savannah Manning and she is on short term disability right now and is allowed to work 4 hrs a day ( this is until 11:00 or 11:30). I looked and you do not have any availability after that time slot unitll Dec. 2017. Please advise where I can place her for she is needing her medication. Please advise

## 2016-08-29 NOTE — Telephone Encounter (Signed)
MIEL OPENED UP OCT 31 AFTERNOON BUT SHE HAS A DENTIST APPT ON THAT DAY IN AFTERNOON. SHE MADE APPT ON NOV. 18TH TO COME IN. CPULD WE GO AHEAD AND CALL HER MEDS IN TILL THEN?

## 2016-08-29 NOTE — Telephone Encounter (Signed)
Please open a slot for her next week

## 2016-09-02 ENCOUNTER — Other Ambulatory Visit: Payer: Self-pay | Admitting: Family Medicine

## 2016-09-02 MED ORDER — OMEPRAZOLE 40 MG PO CPDR: DELAYED_RELEASE_CAPSULE | ORAL | 0 refills | Status: DC

## 2016-09-02 NOTE — Telephone Encounter (Signed)
Sent omeprazole

## 2016-09-03 DIAGNOSIS — M25512 Pain in left shoulder: Secondary | ICD-10-CM | POA: Diagnosis not present

## 2016-09-04 ENCOUNTER — Ambulatory Visit: Payer: BLUE CROSS/BLUE SHIELD | Admitting: Family Medicine

## 2016-09-05 DIAGNOSIS — M25512 Pain in left shoulder: Secondary | ICD-10-CM | POA: Diagnosis not present

## 2016-09-11 ENCOUNTER — Ambulatory Visit: Payer: BLUE CROSS/BLUE SHIELD | Admitting: Family Medicine

## 2016-09-11 DIAGNOSIS — M25512 Pain in left shoulder: Secondary | ICD-10-CM | POA: Diagnosis not present

## 2016-09-12 ENCOUNTER — Telehealth: Payer: Self-pay | Admitting: Family Medicine

## 2016-09-12 NOTE — Telephone Encounter (Signed)
ERRENOUS °

## 2016-09-17 DIAGNOSIS — Z9884 Bariatric surgery status: Secondary | ICD-10-CM | POA: Diagnosis not present

## 2016-09-17 DIAGNOSIS — K219 Gastro-esophageal reflux disease without esophagitis: Secondary | ICD-10-CM | POA: Diagnosis not present

## 2016-09-17 DIAGNOSIS — Z7689 Persons encountering health services in other specified circumstances: Secondary | ICD-10-CM | POA: Diagnosis not present

## 2016-09-17 DIAGNOSIS — Z8719 Personal history of other diseases of the digestive system: Secondary | ICD-10-CM | POA: Diagnosis not present

## 2016-09-17 DIAGNOSIS — E669 Obesity, unspecified: Secondary | ICD-10-CM | POA: Diagnosis not present

## 2016-09-17 DIAGNOSIS — I10 Essential (primary) hypertension: Secondary | ICD-10-CM | POA: Diagnosis not present

## 2016-09-17 DIAGNOSIS — Z6839 Body mass index (BMI) 39.0-39.9, adult: Secondary | ICD-10-CM | POA: Diagnosis not present

## 2016-09-19 DIAGNOSIS — M25512 Pain in left shoulder: Secondary | ICD-10-CM | POA: Diagnosis not present

## 2016-09-20 DIAGNOSIS — K219 Gastro-esophageal reflux disease without esophagitis: Secondary | ICD-10-CM | POA: Diagnosis not present

## 2016-09-20 DIAGNOSIS — Z8719 Personal history of other diseases of the digestive system: Secondary | ICD-10-CM | POA: Diagnosis not present

## 2016-09-21 ENCOUNTER — Other Ambulatory Visit: Payer: Self-pay

## 2016-09-21 ENCOUNTER — Ambulatory Visit: Payer: BLUE CROSS/BLUE SHIELD | Admitting: Family Medicine

## 2016-09-21 MED ORDER — OMEPRAZOLE 40 MG PO CPDR: DELAYED_RELEASE_CAPSULE | ORAL | 0 refills | Status: DC

## 2016-09-24 DIAGNOSIS — M24012 Loose body in left shoulder: Secondary | ICD-10-CM | POA: Diagnosis not present

## 2016-09-24 DIAGNOSIS — M75112 Incomplete rotator cuff tear or rupture of left shoulder, not specified as traumatic: Secondary | ICD-10-CM | POA: Diagnosis not present

## 2016-09-24 DIAGNOSIS — M7582 Other shoulder lesions, left shoulder: Secondary | ICD-10-CM | POA: Diagnosis not present

## 2016-09-24 DIAGNOSIS — M19012 Primary osteoarthritis, left shoulder: Secondary | ICD-10-CM | POA: Diagnosis not present

## 2016-09-25 DIAGNOSIS — M25512 Pain in left shoulder: Secondary | ICD-10-CM | POA: Diagnosis not present

## 2016-10-02 DIAGNOSIS — M25512 Pain in left shoulder: Secondary | ICD-10-CM | POA: Diagnosis not present

## 2016-10-03 DIAGNOSIS — Z713 Dietary counseling and surveillance: Secondary | ICD-10-CM | POA: Diagnosis not present

## 2016-10-03 DIAGNOSIS — Z6839 Body mass index (BMI) 39.0-39.9, adult: Secondary | ICD-10-CM | POA: Diagnosis not present

## 2016-10-04 DIAGNOSIS — M25512 Pain in left shoulder: Secondary | ICD-10-CM | POA: Diagnosis not present

## 2016-10-09 ENCOUNTER — Telehealth: Payer: Self-pay | Admitting: Family Medicine

## 2016-10-09 NOTE — Telephone Encounter (Signed)
[facility]. Due to [facility] not able to [facility] year patient is asking that you please send referral to [facility]Meredith [facility]  (P) [phone](F) [phone]

## 2016-10-09 NOTE — Telephone Encounter (Signed)
Referral was sent to Dr. Kateri Mcuke as requested by patient.

## 2016-10-10 DIAGNOSIS — Z6839 Body mass index (BMI) 39.0-39.9, adult: Secondary | ICD-10-CM | POA: Diagnosis not present

## 2016-10-10 DIAGNOSIS — E669 Obesity, unspecified: Secondary | ICD-10-CM | POA: Diagnosis not present

## 2016-10-10 DIAGNOSIS — M25512 Pain in left shoulder: Secondary | ICD-10-CM | POA: Diagnosis not present

## 2016-10-10 DIAGNOSIS — F419 Anxiety disorder, unspecified: Secondary | ICD-10-CM | POA: Diagnosis not present

## 2016-10-12 ENCOUNTER — Ambulatory Visit (INDEPENDENT_AMBULATORY_CARE_PROVIDER_SITE_OTHER): Payer: BLUE CROSS/BLUE SHIELD | Admitting: Family Medicine

## 2016-10-12 ENCOUNTER — Ambulatory Visit
Admission: RE | Admit: 2016-10-12 | Discharge: 2016-10-12 | Disposition: A | Discharge status: Home / Self Care | Payer: BLUE CROSS/BLUE SHIELD | Source: Ambulatory Visit | Attending: Family Medicine | Admitting: Family Medicine

## 2016-10-12 ENCOUNTER — Encounter: Payer: Self-pay | Admitting: Family Medicine

## 2016-10-12 VITALS — HR 76 | Temp 97.7°F | Resp 16 | Wt 253.8 lb

## 2016-10-12 DIAGNOSIS — K219 Gastro-esophageal reflux disease without esophagitis: Secondary | ICD-10-CM | POA: Diagnosis not present

## 2016-10-12 DIAGNOSIS — R739 Hyperglycemia, unspecified: Secondary | ICD-10-CM

## 2016-10-12 DIAGNOSIS — M25512 Pain in left shoulder: Secondary | ICD-10-CM | POA: Diagnosis not present

## 2016-10-12 DIAGNOSIS — Z1159 Encounter for screening for other viral diseases: Secondary | ICD-10-CM | POA: Diagnosis not present

## 2016-10-12 DIAGNOSIS — G4709 Other insomnia: Secondary | ICD-10-CM

## 2016-10-12 DIAGNOSIS — D509 Iron deficiency anemia, unspecified: Secondary | ICD-10-CM | POA: Diagnosis not present

## 2016-10-12 DIAGNOSIS — Z23 Encounter for immunization: Secondary | ICD-10-CM

## 2016-10-12 DIAGNOSIS — E668 Other obesity: Secondary | ICD-10-CM

## 2016-10-12 DIAGNOSIS — I10 Essential (primary) hypertension: Secondary | ICD-10-CM

## 2016-10-12 DIAGNOSIS — Z01818 Encounter for other preprocedural examination: Secondary | ICD-10-CM | POA: Insufficient documentation

## 2016-10-12 DIAGNOSIS — Z9884 Bariatric surgery status: Secondary | ICD-10-CM

## 2016-10-12 DIAGNOSIS — E785 Hyperlipidemia, unspecified: Secondary | ICD-10-CM

## 2016-10-12 DIAGNOSIS — Z9889 Other specified postprocedural states: Secondary | ICD-10-CM | POA: Insufficient documentation

## 2016-10-12 DIAGNOSIS — K429 Umbilical hernia without obstruction or gangrene: Secondary | ICD-10-CM

## 2016-10-12 LAB — CBC WITH DIFFERENTIAL/PLATELET
Basophils Absolute: 0 cells/uL (ref 0–200)
Basophils Relative: 0 %
Eosinophils Absolute: 162 cells/uL (ref 15–500)
Eosinophils Relative: 3 %
HCT: 35.3 % (ref 35.0–45.0)
Hemoglobin: 11.7 g/dL (ref 11.7–15.5)
Lymphocytes Relative: 32 %
Lymphs Abs: 1728 cells/uL (ref 850–3900)
MCH: 28.1 pg (ref 27.0–33.0)
MCHC: 33.1 g/dL (ref 32.0–36.0)
MCV: 84.9 fL (ref 80.0–100.0)
MPV: 10 fL (ref 7.5–12.5)
Monocytes Absolute: 432 cells/uL (ref 200–950)
Monocytes Relative: 8 %
Neutro Abs: 3078 cells/uL (ref 1500–7800)
Neutrophils Relative %: 57 %
Platelets: 296 10*3/uL (ref 140–400)
RBC: 4.16 MIL/uL (ref 3.80–5.10)
RDW: 15.8 % — ABNORMAL HIGH (ref 11.0–15.0)
WBC: 5.4 10*3/uL (ref 3.8–10.8)

## 2016-10-12 LAB — VITAMIN B12: Vitamin B-12: 279 pg/mL (ref 200–1100)

## 2016-10-12 LAB — LIPID PANEL
Cholesterol: 232 mg/dL — ABNORMAL HIGH (ref <200)
HDL: 70 mg/dL (ref >50)
LDL Cholesterol: 138 mg/dL — ABNORMAL HIGH (ref <100)
Total CHOL/HDL Ratio: 3.3 Ratio (ref <5.0)
Triglycerides: 120 mg/dL (ref <150)
VLDL: 24 mg/dL (ref <30)

## 2016-10-12 LAB — COMPLETE METABOLIC PANEL WITH GFR
ALT: 7 U/L (ref 6–29)
AST: 12 U/L (ref 10–35)
Albumin: 3.8 g/dL (ref 3.6–5.1)
Alkaline Phosphatase: 98 U/L (ref 33–130)
BUN: 13 mg/dL (ref 7–25)
CO2: 27 mmol/L (ref 20–31)
Calcium: 8.9 mg/dL (ref 8.6–10.4)
Chloride: 104 mmol/L (ref 98–110)
Creat: 0.79 mg/dL (ref 0.50–1.05)
GFR, Est African American: >89 mL/min (ref >=60)
GFR, Est Non African American: 86 mL/min (ref >=60)
Glucose, Bld: 91 mg/dL (ref 65–99)
Potassium: 4.2 mmol/L (ref 3.5–5.3)
Sodium: 139 mmol/L (ref 135–146)
Total Bilirubin: 0.5 mg/dL (ref 0.2–1.2)
Total Protein: 6.4 g/dL (ref 6.1–8.1)

## 2016-10-12 LAB — IRON,TIBC AND FERRITIN PANEL
%SAT: 11 % (ref 11–50)
Ferritin: 8 ng/mL — ABNORMAL LOW (ref 10–232)
Iron: 49 ug/dL (ref 45–160)
TIBC: 456 ug/dL — ABNORMAL HIGH (ref 250–450)

## 2016-10-12 LAB — TSH: TSH: 1.49 mIU/L

## 2016-10-12 MED ORDER — SIMVASTATIN 20 MG PO TABS: 20.0000 mg | ORAL_TABLET | Freq: Every day | ORAL | 1 refills | Status: DC

## 2016-10-12 MED ORDER — OMEPRAZOLE 40 MG PO CPDR: DELAYED_RELEASE_CAPSULE | ORAL | 1 refills | Status: DC

## 2016-10-12 NOTE — Progress Notes (Signed)
Name: Savannah Manning   MRN: [id number]    DOB: September 07, 1961   Date:10/12/2016       Progress Note  Subjective  Chief Complaint  Chief Complaint  Patient presents with  . Medication Refill    omeprazole    HPI  Iron deficiency anemia: she had bypass surgery in 2005 and developed anemia a few years later, she had infusions in the past. Currently feeling tired, no energy, she has noticed pica ( chewing ice and Argo starch ). She does like red meat, eating chicken, and green vegetables, occasionally beans. No blood in stools.   GERD: she is doing well now, taking Omeprazole most nights,  symptoms of epigastric burning and occasionally regurgitationt.   Obesity: she has been obese since childhood. She has tried multiple diets in the past, she had gastric bypass in 2005. Weight before surgery was 314 lbs, she went down to 164 lbs, but has been gradually gaining weight again, gained about 90 lbs back. She states her weight has gone up since she started to work from home. She had surgery done at [facility] and is considering sleeve.   Umbilical hernia: she has a growth on abdominal area, that changes in size. Occasionally causes pain, epigastric cramping  Hyperlipidemia: taking simvastatin, last LDL was okay, we will recheck labs. No myalgias  HTN: she has not been taking Chlorthalidone and we will stop medication since bp is at goal. She denies chest pain, she has occasional palpitation  History of left shoulder surgery: still getting PT, pain and decrease rom still present   Patient Active Problem List   Diagnosis Date Noted  . History of shoulder surgery 10/12/2016  . Primary osteoarthritis of left shoulder 06/14/2016  . Incomplete tear of left rotator cuff 05/30/2016  . Rotator cuff tendinitis 05/30/2016  . History of bariatric surgery 05/04/2016  . Allergic rhinitis 05/12/2015  . Benign essential HTN 05/12/2015  . Anxiety and depression 05/12/2015  . Insomnia, persistent 05/12/2015  .  Dyslipidemia 05/12/2015  . Edema extremities 05/12/2015  . Gastro-esophageal reflux disease without esophagitis 05/12/2015  . Hypoglycemia 05/12/2015  . Eczema intertrigo 05/12/2015  . Anemia, iron deficiency 05/12/2015  . Migraine 05/12/2015  . Plantar fasciitis 05/12/2015  . Umbilical hernia without obstruction and without gangrene 05/12/2015  . Extreme obesity (HCC) 02/13/2008    Past Surgical History:  Procedure Laterality Date  . ABDOMINAL HYSTERECTOMY  2012  . CHOLECYSTECTOMY    . DILATION AND CURETTAGE OF UTERUS    . GASTRIC BYPASS  2005  . SHOULDER ARTHROSCOPY WITH OPEN ROTATOR CUFF REPAIR Left 06/14/2016   Procedure: SHOULDER ARTHROSCOPY WITH OPEN ROTATOR CUFF REPAIR, DECOMPRESSION, EXCISION LOOSE BODY, DEBRIDEMENT;  Surgeon: Christena Flake, MD;  Location: [facility] ORS;  Service: Orthopedics;  Laterality: Left;    Family History  Problem Relation Age of Onset  . Hypertension Mother   . Cancer Father     prostate  . Heart disease Brother   . Breast cancer Cousin 51    Social History   Social History  . Marital status: Single    Spouse name: N/A  . Number of children: N/A  . Years of education: N/A   Occupational History  . Not on file.   Social History Main Topics  . Smoking status: Never Smoker  . Smokeless tobacco: Never Used  . Alcohol use No  . Drug use: No  . Sexual activity: Not Currently   Other Topics Concern  . Not on file   Social  History Narrative  . No narrative on file     Current Outpatient Prescriptions:  .  Butalbital-APAP-Caffeine 50-300-40 MG CAPS, Take 1 capsule by mouth daily as needed (migraine). , Disp: , Rfl:  .  fluticasone (FLONASE) 50 MCG/ACT nasal spray, Place 2 sprays into both nostrils daily. (Patient not taking: Reported on 06/14/2016), Disp: 16 g, Rfl: 0 .  metoprolol tartrate (LOPRESSOR) 25 MG tablet, Take 1 tablet (25 mg total) by mouth 2 (two) times daily., Disp: 30 tablet, Rfl: 0 .  omeprazole (PRILOSEC) 40 MG capsule,  TAKE 1 CAPSULE(40 MG) BY MOUTH DAILY, Disp: 90 capsule, Rfl: 1 .  simvastatin (ZOCOR) 20 MG tablet, Take 1 tablet (20 mg total) by mouth daily at 6 PM., Disp: 90 tablet, Rfl: 1 .  traZODone (DESYREL) 50 MG tablet, Take 1 tablet (50 mg total) by mouth at bedtime., Disp: 90 tablet, Rfl: 1 .  vitamin B-12 (CYANOCOBALAMIN) 1000 MCG tablet, Take 1,000 mcg by mouth daily., Disp: , Rfl:   Allergies  Allergen Reactions  . Lisinopril Swelling    Facial Swelling  . Ace Inhibitors     angioedema  . Codeine Swelling  . Valsartan Swelling     ROS  Constitutional: Negative for fever, positive  weight change.  Respiratory: Negative for cough , positive for  shortness of breath with activity .   Cardiovascular: Negative for chest pain or palpitations.  Gastrointestinal: positive  for intermittent abdominal pain, no bowel changes.  Musculoskeletal: Negative for gait problem or joint swelling.  Skin: Negative for rash.  Neurological: Negative for dizziness or headache.  No other specific complaints in a complete review of systems (except as listed in HPI above).  Objective  Vitals:   10/12/16 0740  Pulse: 76  Resp: 16  Temp: 97.7 F (36.5 C)  TempSrc: Oral  SpO2: 98%  Weight: 253 lb 12.8 oz (115.1 kg)    Body mass index is 39.75 kg/m.  Physical Exam  Constitutional: Patient appears well-developed and well-nourished. Obese  No distress.  HEENT: head atraumatic, normocephalic, pupils equal and reactive to light,neck supple, throat within normal limits Cardiovascular: Normal rate, regular rhythm and normal heart sounds.  No murmur heard. No BLE edema. Pulmonary/Chest: Effort normal and breath sounds normal. No respiratory distress. Abdominal: Soft.  There is no tenderness. Umbilical hernia/versus incisional, medium in size, reducible non-tender today  Psychiatric: Patient has a normal mood and affect. behavior is normal. Judgment and thought content normal.  PHQ2/9: Depression screen  [facility] 05/04/2016 03/22/2016 09/22/2015 05/13/2015  Decreased Interest 0 0 0 0  Down, Depressed, Hopeless 0 0 0 0  PHQ - 2 Score 0 0 0 0    Fall Risk: Fall Risk  05/04/2016 03/22/2016 09/22/2015 05/13/2015  Falls in the past year? No No No No      Assessment & Plan  1. Iron deficiency anemia, unspecified iron deficiency anemia type  - Iron, TIBC and Ferritin Panel  2. Dyslipidemia  - Lipid panel - simvastatin (ZOCOR) 20 MG tablet; Take 1 tablet (20 mg total) by mouth daily at 6 PM.  Dispense: 90 tablet; Refill: 1  3. Morbid obesity, unspecified obesity type (HCC)  - TSH  4. Gastroesophageal reflux disease without esophagitis  - omeprazole (PRILOSEC) 40 MG capsule; TAKE 1 CAPSULE(40 MG) BY MOUTH DAILY  Dispense: 90 capsule; Refill: 1  5. Benign essential HTN  - EKG 12-Lead  6. History of bariatric surgery  - Vitamin B12 - VITAMIN D 25 Hydroxy (Vit-D Deficiency, Fractures) -  CBC with Differential/Platelet - COMPLETE METABOLIC PANEL WITH GFR  7. Other insomnia  Doing well on prn medication   8. Extreme obesity (HCC)  Trying to have a bariatric surgery revision, but will see dietician first  9. Umbilical hernia without obstruction and without gangrene  [facility], needs EKG and CXR before surgery and we will get it today  10. Hyperglycemia  - Hemoglobin A1c  11. Need for hepatitis C screening test  - Hepatitis C antibody  12. Pre-op examination  - DG Chest 2 View; Future - EKG 12-Lead  13. Needs flu shot  refused

## 2016-10-13 LAB — HEMOGLOBIN A1C
Hgb A1c MFr Bld: 5.8 % — ABNORMAL HIGH (ref <5.7)
Mean Plasma Glucose: 120 mg/dL

## 2016-10-13 LAB — HEPATITIS C ANTIBODY: HCV Ab: NEGATIVE

## 2016-10-13 LAB — VITAMIN D 25 HYDROXY (VIT D DEFICIENCY, FRACTURES): Vit D, 25-Hydroxy: 17 ng/mL — ABNORMAL LOW (ref 30–100)

## 2016-10-14 ENCOUNTER — Other Ambulatory Visit: Payer: Self-pay | Admitting: Family Medicine

## 2016-10-14 MED ORDER — VITAMIN D (ERGOCALCIFEROL) 1.25 MG (50000 UNIT) PO CAPS: 50000.0000 [IU] | ORAL_CAPSULE | ORAL | 1 refills | Status: DC

## 2016-10-15 DIAGNOSIS — M25512 Pain in left shoulder: Secondary | ICD-10-CM | POA: Diagnosis not present

## 2016-10-23 DIAGNOSIS — M25512 Pain in left shoulder: Secondary | ICD-10-CM | POA: Diagnosis not present

## 2016-10-26 DIAGNOSIS — M75112 Incomplete rotator cuff tear or rupture of left shoulder, not specified as traumatic: Secondary | ICD-10-CM | POA: Diagnosis not present

## 2016-10-26 DIAGNOSIS — M24012 Loose body in left shoulder: Secondary | ICD-10-CM | POA: Diagnosis not present

## 2016-10-26 DIAGNOSIS — M7582 Other shoulder lesions, left shoulder: Secondary | ICD-10-CM | POA: Diagnosis not present

## 2016-10-26 DIAGNOSIS — M19012 Primary osteoarthritis, left shoulder: Secondary | ICD-10-CM | POA: Diagnosis not present

## 2016-10-31 DIAGNOSIS — M25512 Pain in left shoulder: Secondary | ICD-10-CM | POA: Diagnosis not present

## 2016-11-02 DIAGNOSIS — M25512 Pain in left shoulder: Secondary | ICD-10-CM | POA: Diagnosis not present

## 2016-11-06 ENCOUNTER — Encounter: Payer: Self-pay | Admitting: Family Medicine

## 2016-11-07 ENCOUNTER — Encounter: Payer: Self-pay | Admitting: Family Medicine

## 2016-11-20 ENCOUNTER — Ambulatory Visit (INDEPENDENT_AMBULATORY_CARE_PROVIDER_SITE_OTHER): Payer: BLUE CROSS/BLUE SHIELD | Admitting: Family Medicine

## 2016-11-20 ENCOUNTER — Encounter: Payer: Self-pay | Admitting: Family Medicine

## 2016-11-20 VITALS — BP 118/80 | HR 64 | Temp 98.4°F | Resp 16 | Ht 67.0 in | Wt 249.4 lb

## 2016-11-20 DIAGNOSIS — B349 Viral infection, unspecified: Secondary | ICD-10-CM | POA: Diagnosis not present

## 2016-11-20 DIAGNOSIS — R059 Cough, unspecified: Secondary | ICD-10-CM

## 2016-11-20 DIAGNOSIS — R05 Cough: Secondary | ICD-10-CM

## 2016-11-20 DIAGNOSIS — Z23 Encounter for immunization: Secondary | ICD-10-CM | POA: Diagnosis not present

## 2016-11-20 MED ORDER — BUDESONIDE-FORMOTEROL FUMARATE 160-4.5 MCG/ACT IN AERO: 2.0000 | INHALATION_SPRAY | Freq: Two times a day (BID) | RESPIRATORY_TRACT | 0 refills | Status: DC

## 2016-11-20 MED ORDER — HYDROCOD POLST-CPM POLST ER 10-8 MG/5ML PO SUER: 5.0000 mL | Freq: Two times a day (BID) | ORAL | 0 refills | Status: DC | PRN

## 2016-11-20 MED ORDER — FLUTICASONE FUROATE-VILANTEROL 100-25 MCG/INH IN AEPB: 1.0000 | INHALATION_SPRAY | Freq: Every day | RESPIRATORY_TRACT | 0 refills | Status: DC

## 2016-11-20 NOTE — Progress Notes (Signed)
Name: Savannah Manning   MRN: [id number]    DOB: 1961/01/21   Date:11/20/2016       Progress Note  Subjective  Chief Complaint  Chief Complaint  Patient presents with  . Cough    pt stated that she has had symptoms since Friday. Taking OTC delsym  . Sore Throat  . Nasal Congestion  . Flu Vaccine    pt refused    HPI  Viral Syndrome: she states she got sick suddenly 4 days ago. Fever, chills, headache, nasal congestion, rhinorrhea, sore throat, body aches, post-nasal drainage . Fever finally broke yesterday but now has a cough and irritation on her throat. Went to work today, but is too tired and would like to go home and rest. She also has decrease in appetite . She had nausea, but no vomiting or diarrhea. Denies any rashes. Cough is dry, no SOB. She is not a smoker. Sick contacts at home ( brother- and now son has similar symptoms )  Patient Active Problem List   Diagnosis Date Noted  . History of shoulder surgery 10/12/2016  . Primary osteoarthritis of left shoulder 06/14/2016  . Incomplete tear of left rotator cuff 05/30/2016  . Rotator cuff tendinitis 05/30/2016  . History of bariatric surgery 05/04/2016  . Allergic rhinitis 05/12/2015  . Benign essential HTN 05/12/2015  . Anxiety and depression 05/12/2015  . Insomnia, persistent 05/12/2015  . Dyslipidemia 05/12/2015  . Edema extremities 05/12/2015  . Gastro-esophageal reflux disease without esophagitis 05/12/2015  . Hypoglycemia 05/12/2015  . Eczema intertrigo 05/12/2015  . Anemia, iron deficiency 05/12/2015  . Migraine 05/12/2015  . Plantar fasciitis 05/12/2015  . Umbilical hernia without obstruction and without gangrene 05/12/2015  . Extreme obesity ([place]) 02/13/2008    Past Surgical History:  Procedure Laterality Date  . ABDOMINAL HYSTERECTOMY  2012  . CHOLECYSTECTOMY    . DILATION AND CURETTAGE OF UTERUS    . GASTRIC BYPASS  2005  . INDUCED ABORTION N/A 1997   patient was about 22 weeks  . SHOULDER ARTHROSCOPY  WITH OPEN ROTATOR CUFF REPAIR Left 06/14/2016   Procedure: SHOULDER ARTHROSCOPY WITH OPEN ROTATOR CUFF REPAIR, DECOMPRESSION, EXCISION LOOSE BODY, DEBRIDEMENT;  Surgeon: Corky Mull, MD;  Location: [facility] ORS;  Service: Orthopedics;  Laterality: Left;    Family History  Problem Relation Age of Onset  . Hypertension Mother   . Cancer Father     prostate  . Heart disease Brother   . Breast cancer Cousin 32    Social History   Social History  . Marital status: Single    Spouse name: N/A  . Number of children: N/A  . Years of education: N/A   Occupational History  . Not on file.   Social History Main Topics  . Smoking status: Never Smoker  . Smokeless tobacco: Never Used  . Alcohol use No  . Drug use: No  . Sexual activity: Not Currently   Other Topics Concern  . Not on file   Social History Narrative  . No narrative on file     Current Outpatient Prescriptions:  .  Butalbital-APAP-Caffeine 50-300-40 MG CAPS, Take 1 capsule by mouth daily as needed (migraine). , Disp: , Rfl:  .  chlorpheniramine-HYDROcodone (TUSSIONEX PENNKINETIC ER) 10-8 MG/5ML SUER, Take 5 mLs by mouth every 12 (twelve) hours as needed., Disp: 140 mL, Rfl: 0 .  fluticasone (FLONASE) 50 MCG/ACT nasal spray, Place 2 sprays into both nostrils daily. (Patient not taking: Reported on 06/14/2016), Disp: 16 g, Rfl:  0 .  metoprolol tartrate (LOPRESSOR) 25 MG tablet, Take 1 tablet (25 mg total) by mouth 2 (two) times daily., Disp: 30 tablet, Rfl: 0 .  omeprazole (PRILOSEC) 40 MG capsule, TAKE 1 CAPSULE(40 MG) BY MOUTH DAILY, Disp: 90 capsule, Rfl: 1 .  simvastatin (ZOCOR) 20 MG tablet, Take 1 tablet (20 mg total) by mouth daily at 6 PM., Disp: 90 tablet, Rfl: 1 .  traZODone (DESYREL) 50 MG tablet, Take 1 tablet (50 mg total) by mouth at bedtime., Disp: 90 tablet, Rfl: 1 .  vitamin B-12 (CYANOCOBALAMIN) 1000 MCG tablet, Take 1,000 mcg by mouth daily., Disp: , Rfl:  .  Vitamin D, Ergocalciferol, (DRISDOL) 50000 units  CAPS capsule, Take 1 capsule (50,000 Units total) by mouth every 7 (seven) days., Disp: 12 capsule, Rfl: 1  Allergies  Allergen Reactions  . Lisinopril Swelling    Facial Swelling  . Ace Inhibitors     angioedema  . Codeine Swelling  . Valsartan Swelling     ROS  Ten systems reviewed and is negative except as mentioned in HPI   Objective  Vitals:   11/20/16 1142  BP: 118/80  Pulse: 64  Resp: 16  Temp: 98.4 F (36.9 C)  SpO2: 96%  Weight: 249 lb 7 oz (113.1 kg)  Height: '5\' 7"'  (1.702 m)    Body mass index is 39.07 kg/m.  Physical Exam  Constitutional: Patient appears well-developed and well-nourished. Obese  No distress.  HEENT: head atraumatic, normocephalic, pupils equal and reactive to light, ears normal TM bilaterally, neck supple, throat within normal limits Cardiovascular: Normal rate, regular rhythm and normal heart sounds.  No murmur heard. No BLE edema. Pulmonary/Chest: Effort normal and breath sounds normal. No respiratory distress. Abdominal: Soft.  There is no tenderness. Psychiatric: Patient has a normal mood and affect. behavior is normal. Judgment and thought content normal.  Recent Results (from the past 2160 hour(s))  Vitamin B12     Status: None   Collection Time: 10/12/16 12:01 AM  Result Value Ref Range   Vitamin B-12 279 200 - 1,100 pg/mL  VITAMIN D 25 Hydroxy (Vit-D Deficiency, Fractures)     Status: Abnormal   Collection Time: 10/12/16 12:01 AM  Result Value Ref Range   Vit D, 25-Hydroxy 17 (L) 30 - 100 ng/mL    Comment: Vitamin D Status           25-OH Vitamin D        Deficiency                <20 ng/mL        Insufficiency         20 - 29 ng/mL        Optimal             > or = 30 ng/mL   For 25-OH Vitamin D testing on patients on D2-supplementation and patients for whom quantitation of D2 and D3 fractions is required, the QuestAssureD 25-OH VIT D, (D2,D3), LC/MS/MS is recommended: order code [phone] (patients > 2 yrs).   TSH      Status: None   Collection Time: 10/12/16 12:01 AM  Result Value Ref Range   TSH 1.49 mIU/L    Comment:   Reference Range   > or = 20 Years  0.40-4.50   Pregnancy Range First trimester  0.26-2.66 Second trimester 0.55-2.73 Third trimester  0.43-2.91     CBC with Differential/Platelet     Status: Abnormal   Collection Time: 10/12/16  12:01 AM  Result Value Ref Range   WBC 5.4 3.8 - 10.8 K/uL   RBC 4.16 3.80 - 5.10 MIL/uL   Hemoglobin 11.7 11.7 - 15.5 g/dL   HCT 35.3 35.0 - 45.0 %   MCV 84.9 80.0 - 100.0 fL   MCH 28.1 27.0 - 33.0 pg   MCHC 33.1 32.0 - 36.0 g/dL   RDW 15.8 (H) 11.0 - 15.0 %   Platelets 296 140 - 400 K/uL   MPV 10.0 7.5 - 12.5 fL   Neutro Abs 3,078 1,500 - 7,800 cells/uL   Lymphs Abs 1,728 850 - 3,900 cells/uL   Monocytes Absolute 432 200 - 950 cells/uL   Eosinophils Absolute 162 15 - 500 cells/uL   Basophils Absolute 0 0 - 200 cells/uL   Neutrophils Relative % 57 %   Lymphocytes Relative 32 %   Monocytes Relative 8 %   Eosinophils Relative 3 %   Basophils Relative 0 %   Smear Review Criteria for review not met   COMPLETE METABOLIC PANEL WITH GFR     Status: None   Collection Time: 10/12/16 12:01 AM  Result Value Ref Range   Sodium 139 135 - 146 mmol/L   Potassium 4.2 3.5 - 5.3 mmol/L   Chloride 104 98 - 110 mmol/L   CO2 27 20 - 31 mmol/L   Glucose, Bld 91 65 - 99 mg/dL   BUN 13 7 - 25 mg/dL   Creat 0.79 0.50 - 1.05 mg/dL    Comment:   For patients > or = 54 years of age: The upper reference limit for Creatinine is approximately 13% higher for people identified as African-American.      Total Bilirubin 0.5 0.2 - 1.2 mg/dL   Alkaline Phosphatase 98 33 - 130 U/L   AST 12 10 - 35 U/L   ALT 7 6 - 29 U/L   Total Protein 6.4 6.1 - 8.1 g/dL   Albumin 3.8 3.6 - 5.1 g/dL   Calcium 8.9 8.6 - 10.4 mg/dL   GFR, Est African American >89 >=60 mL/min   GFR, Est Non African American 86 >=60 mL/min  Hemoglobin A1c     Status: Abnormal   Collection Time:  10/12/16 12:01 AM  Result Value Ref Range   Hgb A1c MFr Bld 5.8 (H) <5.7 %    Comment:   For someone without known diabetes, a hemoglobin A1c value between 5.7% and 6.4% is consistent with prediabetes and should be confirmed with a follow-up test.   For someone with known diabetes, a value <7% indicates that their diabetes is well controlled. A1c targets should be individualized based on duration of diabetes, age, co-morbid conditions and other considerations.   This assay result is consistent with an increased risk of diabetes.   Currently, no consensus exists regarding use of hemoglobin A1c for diagnosis of diabetes in children.      Mean Plasma Glucose 120 mg/dL  Lipid panel     Status: Abnormal   Collection Time: 10/12/16 12:01 AM  Result Value Ref Range   Cholesterol 232 (H) <200 mg/dL   Triglycerides 120 <150 mg/dL   HDL 70 >50 mg/dL   Total CHOL/HDL Ratio 3.3 <5.0 Ratio   VLDL 24 <30 mg/dL   LDL Cholesterol 138 (H) <100 mg/dL  Hepatitis C antibody     Status: None   Collection Time: 10/12/16 12:01 AM  Result Value Ref Range   HCV Ab NEGATIVE NEGATIVE  Iron, TIBC and Ferritin Panel  Status: Abnormal   Collection Time: 10/12/16 12:01 AM  Result Value Ref Range   Ferritin 8 (L) 10 - 232 ng/mL   Iron 49 45 - 160 ug/dL   TIBC 456 (H) 250 - 450 ug/dL   %SAT 11 11 - 50 %     PHQ2/9: Depression screen [facility] 2/9 05/04/2016 03/22/2016 09/22/2015 05/13/2015  Decreased Interest 0 0 0 0  Down, Depressed, Hopeless 0 0 0 0  PHQ - 2 Score 0 0 0 0     Fall Risk: Fall Risk  05/04/2016 03/22/2016 09/22/2015 05/13/2015  Falls in the past year? No No No No     Assessment & Plan  1. Viral syndrome  Possibly had the flu, still tired, we will treat her symptoms  - chlorpheniramine-HYDROcodone (TUSSIONEX PENNKINETIC ER) 10-8 MG/5ML SUER; Take 5 mLs by mouth every 12 (twelve) hours as needed.  Dispense: 140 mL; Refill: 0  2. Needs flu shot  Refused  3. Cough  -  budesonide-formoterol (SYMBICORT) 160-4.5 MCG/ACT inhaler; Inhale 2 puffs into the lungs 2 (two) times daily.  Dispense: 1 Inhaler; Refill: 0

## 2016-12-05 ENCOUNTER — Telehealth: Payer: Self-pay | Admitting: Family Medicine

## 2016-12-05 NOTE — Telephone Encounter (Signed)
Pt is needing to have someone schedule her a mammo for she had some complications that stops her from scheduling this herself. [facility] wants [facility] in [place]. ANY TIME IS GOOD.

## 2016-12-06 ENCOUNTER — Other Ambulatory Visit: Payer: Self-pay | Admitting: Family Medicine

## 2016-12-06 DIAGNOSIS — Z87898 Personal history of other specified conditions: Secondary | ICD-10-CM

## 2016-12-14 ENCOUNTER — Other Ambulatory Visit: Payer: Self-pay | Admitting: Family Medicine

## 2016-12-17 ENCOUNTER — Ambulatory Visit (INDEPENDENT_AMBULATORY_CARE_PROVIDER_SITE_OTHER): Payer: BLUE CROSS/BLUE SHIELD | Admitting: Family Medicine

## 2016-12-17 ENCOUNTER — Encounter: Payer: Self-pay | Admitting: Family Medicine

## 2016-12-17 DIAGNOSIS — R739 Hyperglycemia, unspecified: Secondary | ICD-10-CM

## 2016-12-17 DIAGNOSIS — Z9884 Bariatric surgery status: Secondary | ICD-10-CM

## 2016-12-17 DIAGNOSIS — I10 Essential (primary) hypertension: Secondary | ICD-10-CM | POA: Diagnosis not present

## 2016-12-17 DIAGNOSIS — J4530 Mild persistent asthma, uncomplicated: Secondary | ICD-10-CM | POA: Insufficient documentation

## 2016-12-17 DIAGNOSIS — R7303 Prediabetes: Secondary | ICD-10-CM

## 2016-12-17 MED ORDER — SEMAGLUTIDE(0.25 OR 0.5MG/DOS) 2 MG/1.5ML ~~LOC~~ SOPN: 0.2500 mg | PEN_INJECTOR | SUBCUTANEOUS | 2 refills | Status: DC

## 2016-12-17 NOTE — Progress Notes (Signed)
Name: Savannah Manning   MRN: [id number]    DOB: [birth year]   Date:12/17/2016       Progress Note  Subjective  Chief Complaint  Chief Complaint  Patient presents with  . Obesity    pt would like to discuss medication     HPI  Obesity: she has been obese since childhood. She has tried multiple diets in the past, she had gastric bypass in 2005. Weight before surgery was 314 lbs, she went down to 164 lbs, but has been gradually gaining weight, gained about 90 lbs back. She states her weight has gone up since she started to work from home. She had surgery done at [facility] and was considering a revision, however because of cost she wants to give medications a trial. She has HTN, discussed options and because of her history of pre-diabetes and hyperglycemia we will try Ozempic, discussed possible side effects, and there are no contraindications for her to start medication. She will bring the medication to our office if she feels like she cannot do it by herself at home.    Patient Active Problem List   Diagnosis Date Noted  . History of shoulder surgery 10/12/2016  . Primary osteoarthritis of left shoulder 06/14/2016  . Incomplete tear of left rotator cuff 05/30/2016  . Rotator cuff tendinitis 05/30/2016  . History of bariatric surgery 05/04/2016  . Allergic rhinitis 05/12/2015  . Benign essential HTN 05/12/2015  . Anxiety and depression 05/12/2015  . Insomnia, persistent 05/12/2015  . Dyslipidemia 05/12/2015  . Edema extremities 05/12/2015  . Gastro-esophageal reflux disease without esophagitis 05/12/2015  . Hypoglycemia 05/12/2015  . Eczema intertrigo 05/12/2015  . Anemia, iron deficiency 05/12/2015  . Migraine 05/12/2015  . Plantar fasciitis 05/12/2015  . Umbilical hernia without obstruction and without gangrene 05/12/2015  . Extreme obesity ([place]) 02/13/2008    Past Surgical History:  Procedure Laterality Date  . ABDOMINAL HYSTERECTOMY  2012  . CHOLECYSTECTOMY    . DILATION AND  CURETTAGE OF UTERUS    . GASTRIC BYPASS  2005  . INDUCED ABORTION N/A 1997   patient was about 22 weeks  . SHOULDER ARTHROSCOPY WITH OPEN ROTATOR CUFF REPAIR Left 06/14/2016   Procedure: SHOULDER ARTHROSCOPY WITH OPEN ROTATOR CUFF REPAIR, DECOMPRESSION, EXCISION LOOSE BODY, DEBRIDEMENT;  Surgeon: Corky Mull, MD;  Location: [facility] ORS;  Service: Orthopedics;  Laterality: Left;    Family History  Problem Relation Age of Onset  . Hypertension Mother   . Cancer Father     prostate  . Heart disease Brother   . Breast cancer Cousin 30    Social History   Social History  . Marital status: Single    Spouse name: N/A  . Number of children: N/A  . Years of education: N/A   Occupational History  . Not on file.   Social History Main Topics  . Smoking status: Never Smoker  . Smokeless tobacco: Never Used  . Alcohol use No  . Drug use: No  . Sexual activity: Not Currently   Other Topics Concern  . Not on file   Social History Narrative  . No narrative on file     Current Outpatient Prescriptions:  .  budesonide-formoterol (SYMBICORT) 160-4.5 MCG/ACT inhaler, Inhale 2 puffs into the lungs 2 (two) times daily., Disp: 1 Inhaler, Rfl: 0 .  Butalbital-APAP-Caffeine 50-300-40 MG CAPS, Take 1 capsule by mouth daily as needed (migraine). , Disp: , Rfl:  .  fluticasone (FLONASE) 50 MCG/ACT nasal spray, Place 2 sprays  into both nostrils daily. (Patient not taking: Reported on 06/14/2016), Disp: 16 g, Rfl: 0 .  metoprolol tartrate (LOPRESSOR) 25 MG tablet, Take 1 tablet (25 mg total) by mouth 2 (two) times daily., Disp: 30 tablet, Rfl: 0 .  omeprazole (PRILOSEC) 40 MG capsule, TAKE 1 CAPSULE(40 MG) BY MOUTH DAILY, Disp: 90 capsule, Rfl: 1 .  Semaglutide (OZEMPIC) 0.25 or 0.5 MG/DOSE SOPN, Inject 0.25 mg into the skin once a week., Disp: 2 pen, Rfl: 2 .  simvastatin (ZOCOR) 20 MG tablet, Take 1 tablet (20 mg total) by mouth daily at 6 PM., Disp: 90 tablet, Rfl: 1 .  traZODone (DESYREL) 50 MG  tablet, Take 1 tablet (50 mg total) by mouth at bedtime., Disp: 90 tablet, Rfl: 1 .  vitamin B-12 (CYANOCOBALAMIN) 1000 MCG tablet, Take 1,000 mcg by mouth daily., Disp: , Rfl:  .  Vitamin D, Ergocalciferol, (DRISDOL) 50000 units CAPS capsule, Take 1 capsule (50,000 Units total) by mouth every 7 (seven) days., Disp: 12 capsule, Rfl: 1  Allergies  Allergen Reactions  . Lisinopril Swelling    Facial Swelling  . Ace Inhibitors     angioedema  . Codeine Swelling  . Valsartan Swelling     ROS  Constitutional: Negative for fever or weight change.  Respiratory:  Positive for intermittent cough at night no  shortness of breath.   Cardiovascular: Negative for chest pain or palpitations.  Gastrointestinal: Negative for abdominal pain, no bowel changes.  Musculoskeletal: Negative for gait problem or joint swelling.  Skin: Negative for rash.  Neurological: Negative for dizziness or headache.  No other specific complaints in a complete review of systems (except as listed in HPI above).  Objective  Vitals:   12/17/16 1407  BP: 120/78  Pulse: 60  Resp: 16  Temp: 98.1 F (36.7 C)  SpO2: 97%  Weight: 248 lb 7 oz (112.7 kg)  Height: 5' 7" (1.702 m)    Body mass index is 38.91 kg/m.  Physical Exam  Constitutional: Patient appears well-developed and well-nourished. Obese  No distress.  HEENT: head atraumatic, normocephalic, pupils equal and reactive to light,  neck supple, throat within normal limits Cardiovascular: Normal rate, regular rhythm and normal heart sounds.  No murmur heard. No BLE edema. Pulmonary/Chest: Effort normal and breath sounds normal. No respiratory distress. Abdominal: Soft.  There is no tenderness. Psychiatric: Patient has a normal mood and affect. behavior is normal. Judgment and thought content normal.  Recent Results (from the past 2160 hour(s))  Vitamin B12     Status: None   Collection Time: 10/12/16 12:01 AM  Result Value Ref Range   Vitamin B-12 279  200 - 1,100 pg/mL  VITAMIN D 25 Hydroxy (Vit-D Deficiency, Fractures)     Status: Abnormal   Collection Time: 10/12/16 12:01 AM  Result Value Ref Range   Vit D, 25-Hydroxy 17 (L) 30 - 100 ng/mL    Comment: Vitamin D Status           25-OH Vitamin D        Deficiency                <20 ng/mL        Insufficiency         20 - 29 ng/mL        Optimal             > or = 30 ng/mL   For 25-OH Vitamin D testing on patients on D2-supplementation and patients for whom quantitation  of D2 and D3 fractions is required, the QuestAssureD 25-OH VIT D, (D2,D3), LC/MS/MS is recommended: order code [id number] (patients > 2 yrs).   TSH     Status: None   Collection Time: 10/12/16 12:01 AM  Result Value Ref Range   TSH 1.49 mIU/L    Comment:   Reference Range   > or = 20 Years  0.40-4.50   Pregnancy Range First trimester  0.26-2.66 Second trimester 0.55-2.73 Third trimester  0.43-2.91     CBC with Differential/Platelet     Status: Abnormal   Collection Time: 10/12/16 12:01 AM  Result Value Ref Range   WBC 5.4 3.8 - 10.8 K/uL   RBC 4.16 3.80 - 5.10 MIL/uL   Hemoglobin 11.7 11.7 - 15.5 g/dL   HCT 35.3 35.0 - 45.0 %   MCV 84.9 80.0 - 100.0 fL   MCH 28.1 27.0 - 33.0 pg   MCHC 33.1 32.0 - 36.0 g/dL   RDW 15.8 (H) 11.0 - 15.0 %   Platelets 296 140 - 400 K/uL   MPV 10.0 7.5 - 12.5 fL   Neutro Abs 3,078 1,500 - 7,800 cells/uL   Lymphs Abs 1,728 850 - 3,900 cells/uL   Monocytes Absolute 432 200 - 950 cells/uL   Eosinophils Absolute 162 15 - 500 cells/uL   Basophils Absolute 0 0 - 200 cells/uL   Neutrophils Relative % 57 %   Lymphocytes Relative 32 %   Monocytes Relative 8 %   Eosinophils Relative 3 %   Basophils Relative 0 %   Smear Review Criteria for review not met   COMPLETE METABOLIC PANEL WITH GFR     Status: None   Collection Time: 10/12/16 12:01 AM  Result Value Ref Range   Sodium 139 135 - 146 mmol/L   Potassium 4.2 3.5 - 5.3 mmol/L   Chloride 104 98 - 110 mmol/L   CO2 27 20 - 31  mmol/L   Glucose, Bld 91 65 - 99 mg/dL   BUN 13 7 - 25 mg/dL   Creat 0.79 0.50 - 1.05 mg/dL    Comment:   For patients > or = 54 years of age: The upper reference limit for Creatinine is approximately 13% higher for people identified as African-American.      Total Bilirubin 0.5 0.2 - 1.2 mg/dL   Alkaline Phosphatase 98 33 - 130 U/L   AST 12 10 - 35 U/L   ALT 7 6 - 29 U/L   Total Protein 6.4 6.1 - 8.1 g/dL   Albumin 3.8 3.6 - 5.1 g/dL   Calcium 8.9 8.6 - 10.4 mg/dL   GFR, Est African American >89 >=60 mL/min   GFR, Est Non African American 86 >=60 mL/min  Hemoglobin A1c     Status: Abnormal   Collection Time: 10/12/16 12:01 AM  Result Value Ref Range   Hgb A1c MFr Bld 5.8 (H) <5.7 %    Comment:   For someone without known diabetes, a hemoglobin A1c value between 5.7% and 6.4% is consistent with prediabetes and should be confirmed with a follow-up test.   For someone with known diabetes, a value <7% indicates that their diabetes is well controlled. A1c targets should be individualized based on duration of diabetes, age, co-morbid conditions and other considerations.   This assay result is consistent with an increased risk of diabetes.   Currently, no consensus exists regarding use of hemoglobin A1c for diagnosis of diabetes in children.      Mean Plasma Glucose 120  mg/dL  Lipid panel     Status: Abnormal   Collection Time: 10/12/16 12:01 AM  Result Value Ref Range   Cholesterol 232 (H) <200 mg/dL   Triglycerides 120 <150 mg/dL   HDL 70 >50 mg/dL   Total CHOL/HDL Ratio 3.3 <5.0 Ratio   VLDL 24 <30 mg/dL   LDL Cholesterol 138 (H) <100 mg/dL  Hepatitis C antibody     Status: None   Collection Time: 10/12/16 12:01 AM  Result Value Ref Range   HCV Ab NEGATIVE NEGATIVE  Iron, TIBC and Ferritin Panel     Status: Abnormal   Collection Time: 10/12/16 12:01 AM  Result Value Ref Range   Ferritin 8 (L) 10 - 232 ng/mL   Iron 49 45 - 160 ug/dL   TIBC 456 (H) 250 - 450 ug/dL    %SAT 11 11 - 50 %     PHQ2/9: Depression screen [facility] 2/9 05/04/2016 03/22/2016 09/22/2015 05/13/2015  Decreased Interest 0 0 0 0  Down, Depressed, Hopeless 0 0 0 0  PHQ - 2 Score 0 0 0 0     Fall Risk: Fall Risk  05/04/2016 03/22/2016 09/22/2015 05/13/2015  Falls in the past year? No No No No     Functional Status Survey: Is the patient deaf or have difficulty hearing?: No Does the patient have difficulty seeing, even when wearing glasses/contacts?: No Does the patient have difficulty concentrating, remembering, or making decisions?: No Does the patient have difficulty walking or climbing stairs?: No Does the patient have difficulty dressing or bathing?: No Does the patient have difficulty doing errands alone such as visiting a doctor's office or shopping?: No   Assessment & Plan  1. Morbid obesity, unspecified obesity type [facility])  Discussed with the patient the risk posed by an increased BMI. Discussed importance of portion control, calorie counting and at least 150 minutes of physical activity weekly. Avoid sweet beverages and drink more water. Eat at least 6 servings of fruit and vegetables daily   2. History of bariatric surgery  2005  3. Hyperglycemia  - Semaglutide (OZEMPIC) 0.25 or 0.5 MG/DOSE SOPN; Inject 0.25 mg into the skin once a week.  Dispense: 2 pen; Refill: 2  4. Pre-diabetes  Denies family history of MEN or thyroid cancer - Semaglutide (OZEMPIC) 0.25 or 0.5 MG/DOSE SOPN; Inject 0.25 mg into the skin once a week.  Dispense: 2 pen; Refill: 2

## 2016-12-18 ENCOUNTER — Telehealth: Payer: Self-pay

## 2016-12-18 NOTE — Telephone Encounter (Signed)
Patient called and states her Insurance denied Semaglutide for her pre-diabetes. Please change medication. Thanks

## 2016-12-19 ENCOUNTER — Other Ambulatory Visit: Payer: Self-pay | Admitting: Family Medicine

## 2016-12-19 ENCOUNTER — Telehealth: Payer: Self-pay | Admitting: Family Medicine

## 2016-12-19 ENCOUNTER — Ambulatory Visit: Payer: BLUE CROSS/BLUE SHIELD | Admitting: Family Medicine

## 2016-12-19 MED ORDER — DULAGLUTIDE 1.5 MG/0.5ML ~~LOC~~ SOAJ: 1.5000 mg | SUBCUTANEOUS | 2 refills | Status: DC

## 2016-12-19 NOTE — Telephone Encounter (Signed)
Please inform pt we do have samples but Dr.Sowles may want to change to something more cost effective for her

## 2016-12-19 NOTE — Progress Notes (Unsigned)
Changed to Trulicity

## 2016-12-19 NOTE — Telephone Encounter (Signed)
Pt informed and will give us a call back

## 2016-12-19 NOTE — Telephone Encounter (Signed)
Patient was informed that due to her insurance the rx for Ozempic would not be covered, so a new rx for Trulicity will be sent in for her to use once a week. She was told to pick up her rx and to come in for a nurse visit so we can show her how to use it. She agreed and said thanks.

## 2016-12-19 NOTE — Telephone Encounter (Signed)
trulicity will cost patient $600 and with copay card it would cost $500. Pt would like to know if there is something different or we have any samples that she can have.

## 2016-12-19 NOTE — Telephone Encounter (Signed)
He probably has a Management consultanthigh deductible insurance. Please ask patient to call insurance to find out what GLP1 agonist they would cover, otherwise we will need to just give her metformin

## 2016-12-19 NOTE — Progress Notes (Signed)
Patient notified

## 2016-12-31 ENCOUNTER — Telehealth: Payer: Self-pay | Admitting: Family Medicine

## 2016-12-31 ENCOUNTER — Encounter: Payer: Self-pay | Admitting: Family Medicine

## 2016-12-31 NOTE — Telephone Encounter (Signed)
Please order

## 2016-12-31 NOTE — Telephone Encounter (Signed)
Pt spoke with [facility] and they told her that you put mammogram both breast but she is needing a follow up right breast ([id number] follow up).

## 2017-01-01 ENCOUNTER — Encounter: Payer: Self-pay | Admitting: Family Medicine

## 2017-01-01 ENCOUNTER — Other Ambulatory Visit: Payer: Self-pay | Admitting: Family Medicine

## 2017-01-01 DIAGNOSIS — R928 Other abnormal and inconclusive findings on diagnostic imaging of breast: Secondary | ICD-10-CM | POA: Insufficient documentation

## 2017-01-01 NOTE — Telephone Encounter (Signed)
Ordered. Please schedule it and notify patient

## 2017-01-01 NOTE — Telephone Encounter (Signed)
Appointment made for 01/24/17 at 2 pm pt aware of appointment date and time

## 2017-01-02 ENCOUNTER — Other Ambulatory Visit: Payer: Self-pay | Admitting: Family Medicine

## 2017-01-02 MED ORDER — METFORMIN HCL ER 750 MG PO TB24: 750.0000 mg | ORAL_TABLET | Freq: Every day | ORAL | 0 refills | Status: DC

## 2017-01-24 ENCOUNTER — Ambulatory Visit
Admission: RE | Admit: 2017-01-24 | Discharge: 2017-01-24 | Disposition: A | Discharge status: Home / Self Care | Payer: BLUE CROSS/BLUE SHIELD | Source: Ambulatory Visit | Attending: Family Medicine | Admitting: Family Medicine

## 2017-01-24 ENCOUNTER — Other Ambulatory Visit: Payer: Self-pay | Admitting: Family Medicine

## 2017-01-24 DIAGNOSIS — R928 Other abnormal and inconclusive findings on diagnostic imaging of breast: Secondary | ICD-10-CM

## 2017-01-24 DIAGNOSIS — R921 Mammographic calcification found on diagnostic imaging of breast: Secondary | ICD-10-CM | POA: Insufficient documentation

## 2017-02-11 DIAGNOSIS — M25511 Pain in right shoulder: Secondary | ICD-10-CM | POA: Diagnosis not present

## 2017-02-11 DIAGNOSIS — M542 Cervicalgia: Secondary | ICD-10-CM | POA: Diagnosis not present

## 2017-02-11 DIAGNOSIS — M7521 Bicipital tendinitis, right shoulder: Secondary | ICD-10-CM | POA: Diagnosis not present

## 2017-02-11 DIAGNOSIS — M7581 Other shoulder lesions, right shoulder: Secondary | ICD-10-CM | POA: Diagnosis not present

## 2017-02-25 DIAGNOSIS — E669 Obesity, unspecified: Secondary | ICD-10-CM | POA: Diagnosis not present

## 2017-02-25 DIAGNOSIS — F419 Anxiety disorder, unspecified: Secondary | ICD-10-CM | POA: Diagnosis not present

## 2017-02-25 DIAGNOSIS — Z8719 Personal history of other diseases of the digestive system: Secondary | ICD-10-CM | POA: Diagnosis not present

## 2017-02-25 DIAGNOSIS — I10 Essential (primary) hypertension: Secondary | ICD-10-CM | POA: Diagnosis not present

## 2017-02-27 DIAGNOSIS — F54 Psychological and behavioral factors associated with disorders or diseases classified elsewhere: Secondary | ICD-10-CM | POA: Diagnosis not present

## 2017-02-27 DIAGNOSIS — E669 Obesity, unspecified: Secondary | ICD-10-CM | POA: Diagnosis not present

## 2017-03-04 DIAGNOSIS — I10 Essential (primary) hypertension: Secondary | ICD-10-CM | POA: Diagnosis not present

## 2017-03-04 DIAGNOSIS — Z01818 Encounter for other preprocedural examination: Secondary | ICD-10-CM | POA: Diagnosis not present

## 2017-03-04 DIAGNOSIS — R0602 Shortness of breath: Secondary | ICD-10-CM | POA: Diagnosis not present

## 2017-03-18 ENCOUNTER — Ambulatory Visit: Payer: BLUE CROSS/BLUE SHIELD | Admitting: Family Medicine

## 2017-03-25 DIAGNOSIS — M7521 Bicipital tendinitis, right shoulder: Secondary | ICD-10-CM | POA: Diagnosis not present

## 2017-03-25 DIAGNOSIS — M7581 Other shoulder lesions, right shoulder: Secondary | ICD-10-CM | POA: Diagnosis not present

## 2017-03-28 DIAGNOSIS — R12 Heartburn: Secondary | ICD-10-CM | POA: Diagnosis not present

## 2017-03-28 DIAGNOSIS — R635 Abnormal weight gain: Secondary | ICD-10-CM | POA: Diagnosis not present

## 2017-03-28 DIAGNOSIS — K2950 Unspecified chronic gastritis without bleeding: Secondary | ICD-10-CM | POA: Diagnosis not present

## 2017-03-28 DIAGNOSIS — Z9884 Bariatric surgery status: Secondary | ICD-10-CM | POA: Diagnosis not present

## 2017-03-28 DIAGNOSIS — Z6839 Body mass index (BMI) 39.0-39.9, adult: Secondary | ICD-10-CM | POA: Diagnosis not present

## 2017-04-05 ENCOUNTER — Other Ambulatory Visit: Payer: Self-pay | Admitting: Surgery

## 2017-04-05 DIAGNOSIS — M7581 Other shoulder lesions, right shoulder: Principal | ICD-10-CM

## 2017-04-05 DIAGNOSIS — M778 Other enthesopathies, not elsewhere classified: Secondary | ICD-10-CM

## 2017-04-15 ENCOUNTER — Ambulatory Visit: Payer: BLUE CROSS/BLUE SHIELD | Admitting: Family Medicine

## 2017-04-24 ENCOUNTER — Ambulatory Visit (INDEPENDENT_AMBULATORY_CARE_PROVIDER_SITE_OTHER): Payer: BLUE CROSS/BLUE SHIELD | Admitting: Family Medicine

## 2017-04-24 ENCOUNTER — Encounter: Payer: Self-pay | Admitting: Family Medicine

## 2017-04-24 VITALS — BP 134/82 | HR 65 | Temp 97.6°F | Resp 16 | Ht 67.0 in | Wt 259.7 lb

## 2017-04-24 DIAGNOSIS — E785 Hyperlipidemia, unspecified: Secondary | ICD-10-CM | POA: Diagnosis not present

## 2017-04-24 DIAGNOSIS — R739 Hyperglycemia, unspecified: Secondary | ICD-10-CM | POA: Diagnosis not present

## 2017-04-24 DIAGNOSIS — E538 Deficiency of other specified B group vitamins: Secondary | ICD-10-CM | POA: Insufficient documentation

## 2017-04-24 DIAGNOSIS — Z9884 Bariatric surgery status: Secondary | ICD-10-CM | POA: Diagnosis not present

## 2017-04-24 DIAGNOSIS — G4709 Other insomnia: Secondary | ICD-10-CM

## 2017-04-24 DIAGNOSIS — R7303 Prediabetes: Secondary | ICD-10-CM | POA: Diagnosis not present

## 2017-04-24 DIAGNOSIS — Z0189 Encounter for other specified special examinations: Secondary | ICD-10-CM | POA: Diagnosis not present

## 2017-04-24 DIAGNOSIS — D509 Iron deficiency anemia, unspecified: Secondary | ICD-10-CM | POA: Diagnosis not present

## 2017-04-24 DIAGNOSIS — K219 Gastro-esophageal reflux disease without esophagitis: Secondary | ICD-10-CM

## 2017-04-24 DIAGNOSIS — R635 Abnormal weight gain: Secondary | ICD-10-CM

## 2017-04-24 DIAGNOSIS — E559 Vitamin D deficiency, unspecified: Secondary | ICD-10-CM | POA: Diagnosis not present

## 2017-04-24 DIAGNOSIS — I10 Essential (primary) hypertension: Secondary | ICD-10-CM | POA: Diagnosis not present

## 2017-04-24 MED ORDER — OMEPRAZOLE 40 MG PO CPDR: DELAYED_RELEASE_CAPSULE | ORAL | 1 refills | Status: DC

## 2017-04-24 MED ORDER — METOPROLOL TARTRATE 25 MG PO TABS: 25.0000 mg | ORAL_TABLET | Freq: Two times a day (BID) | ORAL | 1 refills | Status: DC

## 2017-04-24 MED ORDER — SEMAGLUTIDE(0.25 OR 0.5MG/DOS) 2 MG/1.5ML ~~LOC~~ SOPN: 0.2500 mg | PEN_INJECTOR | SUBCUTANEOUS | 2 refills | Status: DC

## 2017-04-24 MED ORDER — SIMVASTATIN 20 MG PO TABS: 20.0000 mg | ORAL_TABLET | Freq: Every day | ORAL | 1 refills | Status: DC

## 2017-04-24 NOTE — Progress Notes (Signed)
Name: Savannah Manning   MRN: [id number]    DOB: [birth year]   Date:04/24/2017       Progress Note  Subjective  Chief Complaint  Chief Complaint  Patient presents with  . Medication Refill    4 month F/U  . Obesity    Vicki faxed over labs that need to be ordered by PCP for the weight loss center.  . Gastroesophageal Reflux    Stable  . Hypertension    Denies any symptoms  . Hyperlipidemia    Cramps in legs, feet when laying down  . Knee Pain    Right knee has been bothering her.    HPI  Obesity: she has been obese since childhood. She has tried multiple diets in the past, she hadgastric bypass in 2005. Weight before surgery was 314 lbs, she went down to 164 lbs, but has been gradually gaining weight, gained about 100 lbs back. She states her weight has gone up since she started to work from home since 2010 She had surgery done at [facility] and is considering a revision,she has failed Metformin, gained 9 lbs while on it since 12/2016 - discussed Ozempic and we will try it, since she has pre-diabetes also. She denies polyphagia, polydipsia or polyuria  Iron deficiency anemia: she had bypass surgery in 2005 and developed anemia a few years later, she had infusions in the past. Currently She is has  pica ( craves  Argo starch occasionally now), no longer chewing ice. She does like red meat, eating chicken, and green vegetables, occasionally beans. No blood in stools.   GERD: she is doing well now, taking Omeprazole most nights,  symptoms of epigastric burning and occasionally regurgitation.  Umbilical hernia: she has a growth on abdominal area, that changes in size. Occasionally causes pain, epigastric cramping  Hyperlipidemia: taking simvastatin, last LDL was okay, we will recheck labs. No myalgias  HTN:. She denies chest pain, she has occasional palpitation, but is controlled with Lopressor  Right knee pain: she states same symptoms that she had prior to bypass surgery, aching and  stiff right knee. No redness or increase in warmth  B12 and vitamin D deficiency: we will recheck labs, she is on MVI, vitamin and extra vitamin D and also B12 but not sub-lingual type   Patient Active Problem List   Diagnosis Date Noted  . Vitamin D deficiency 04/24/2017  . B12 deficiency 04/24/2017  . Abnormal mammogram of right breast 01/01/2017  . Asthma, mild persistent 12/17/2016  . History of shoulder surgery 10/12/2016  . Primary osteoarthritis of left shoulder 06/14/2016  . Incomplete tear of left rotator cuff 05/30/2016  . Rotator cuff tendinitis 05/30/2016  . History of bariatric surgery 05/04/2016  . Allergic rhinitis 05/12/2015  . Benign essential HTN 05/12/2015  . Anxiety and depression 05/12/2015  . Insomnia, persistent 05/12/2015  . Dyslipidemia 05/12/2015  . Edema extremities 05/12/2015  . Gastro-esophageal reflux disease without esophagitis 05/12/2015  . Hypoglycemia 05/12/2015  . Eczema intertrigo 05/12/2015  . Anemia, iron deficiency 05/12/2015  . Migraine 05/12/2015  . Plantar fasciitis 05/12/2015  . Umbilical hernia without obstruction and without gangrene 05/12/2015  . Extreme obesity 02/13/2008    Past Surgical History:  Procedure Laterality Date  . ABDOMINAL HYSTERECTOMY  2012  . CHOLECYSTECTOMY    . DILATION AND CURETTAGE OF UTERUS    . GASTRIC BYPASS  2005  . INDUCED ABORTION N/A 1997   patient was about 22 weeks  . SHOULDER ARTHROSCOPY WITH OPEN ROTATOR  CUFF REPAIR Left 06/14/2016   Procedure: SHOULDER ARTHROSCOPY WITH OPEN ROTATOR CUFF REPAIR, DECOMPRESSION, EXCISION LOOSE BODY, DEBRIDEMENT;  Surgeon: Christena Flake, MD;  Location: [facility] ORS;  Service: Orthopedics;  Laterality: Left;    Family History  Problem Relation Age of Onset  . Hypertension Mother   . Cancer Father        prostate  . Heart disease Brother   . Breast cancer Cousin 39    Social History   Social History  . Marital status: Single    Spouse name: N/A  . Number of  children: N/A  . Years of education: N/A   Occupational History  . Not on file.   Social History Main Topics  . Smoking status: Never Smoker  . Smokeless tobacco: Never Used  . Alcohol use No  . Drug use: No  . Sexual activity: Not Currently   Other Topics Concern  . Not on file   Social History Narrative  . No narrative on file     Current Outpatient Prescriptions:  .  Butalbital-APAP-Caffeine 50-300-40 MG CAPS, Take 1 capsule by mouth daily as needed (migraine). , Disp: , Rfl:  .  fluticasone (FLONASE) 50 MCG/ACT nasal spray, Place 2 sprays into both nostrils daily., Disp: 16 g, Rfl: 0 .  Loratadine 10 MG CAPS, Take 1 capsule by mouth as needed., Disp: , Rfl:  .  metoprolol tartrate (LOPRESSOR) 25 MG tablet, Take 1 tablet (25 mg total) by mouth 2 (two) times daily., Disp: 180 tablet, Rfl: 1 .  omeprazole (PRILOSEC) 40 MG capsule, TAKE 1 CAPSULE(40 MG) BY MOUTH DAILY, Disp: 90 capsule, Rfl: 1 .  simvastatin (ZOCOR) 20 MG tablet, Take 1 tablet (20 mg total) by mouth daily at 6 PM., Disp: 90 tablet, Rfl: 1 .  traZODone (DESYREL) 50 MG tablet, Take 1 tablet (50 mg total) by mouth at bedtime., Disp: 90 tablet, Rfl: 1 .  Vitamin D, Cholecalciferol, 1000 units CAPS, Take 1 capsule by mouth daily., Disp: , Rfl:  .  Semaglutide (OZEMPIC) 0.25 or 0.5 MG/DOSE SOPN, Inject 0.25-0.5 mg into the skin once a week., Disp: 2 pen, Rfl: 2  Allergies  Allergen Reactions  . Lisinopril Swelling    Facial Swelling  . Ace Inhibitors     angioedema  . Codeine Swelling  . Valsartan Swelling     ROS  Constitutional: Negative for fever, positive for weight change.  Respiratory: Negative for cough and shortness of breath.   Cardiovascular: Negative for chest pain or palpitations.  Gastrointestinal: Negative for abdominal pain, no bowel changes.  Musculoskeletal: Positive  for gait problem - intermittently but no  joint swelling.  Skin: Negative for rash.  Neurological: Negative for dizziness,  positive for intermittent  headache.  No other specific complaints in a complete review of systems (except as listed in HPI above).  Objective  Vitals:   04/24/17 1342  BP: 134/82  Pulse: 65  Resp: 16  Temp: 97.6 F (36.4 C)  TempSrc: Oral  SpO2: 97%  Weight: 259 lb 11.2 oz (117.8 kg)  Height: 5\' 7"  (1.702 m)    Body mass index is 40.67 kg/m.  Physical Exam  Constitutional: Patient appears well-developed and well-nourished. Obese  No distress.  HEENT: head atraumatic, normocephalic, pupils equal and reactive to light,neck supple, throat within normal limits Cardiovascular: Normal rate, regular rhythm and normal heart sounds.  No murmur heard. No BLE edema. Pulmonary/Chest: Effort normal and breath sounds normal. No respiratory distress. Abdominal: Soft.  There is  no tenderness. Muscular Skeletal: crepitus with extension of right knee and with abduction of right shoulder Psychiatric: Patient has a normal mood and affect. behavior is normal. Judgment and thought content normal.   PHQ2/9: [facility] 04/24/2017 05/04/2016 03/22/2016 09/22/2015 05/13/2015  Decreased Interest 0 0 0 0 0  Down, Depressed, Hopeless 0 0 0 0 0  PHQ - 2 Score 0 0 0 0 0     Fall Risk: Fall Risk  04/24/2017 05/04/2016 03/22/2016 09/22/2015 05/13/2015  Falls in the past year? No No No No No     Functional Status Survey: Is the patient deaf or have difficulty hearing?: No Does the patient have difficulty seeing, even when wearing glasses/contacts?: No Does the patient have difficulty concentrating, remembering, or making decisions?: No Does the patient have difficulty walking or climbing stairs?: No Does the patient have difficulty dressing or bathing?: No Does the patient have difficulty doing errands alone such as visiting a doctor's office or shopping?: No   Assessment & Plan  1. Benign essential HTN  - metoprolol tartrate (LOPRESSOR) 25 MG tablet; Take 1 tablet (25 mg total) by  mouth 2 (two) times daily.  Dispense: 180 tablet; Refill: 1 - COMPLETE METABOLIC PANEL WITH GFR  2. History of bariatric surgery  Had surgery in 2005, and is going for a revision   3. Hyperglycemia  - Semaglutide (OZEMPIC) 0.25 or 0.5 MG/DOSE SOPN; Inject 0.25-0.5 mg into the skin once a week.  Dispense: 2 pen; Refill: 2  4. Pre-diabetes  - Semaglutide (OZEMPIC) 0.25 or 0.5 MG/DOSE SOPN; Inject 0.25-0.5 mg into the skin once a week.  Dispense: 2 pen; Refill: 2 - Insulin, fasting - Hemoglobin A1c  5. Gastroesophageal reflux disease without esophagitis  - omeprazole (PRILOSEC) 40 MG capsule; TAKE 1 CAPSULE(40 MG) BY MOUTH DAILY  Dispense: 90 capsule; Refill: 1  6. Dyslipidemia  - simvastatin (ZOCOR) 20 MG tablet; Take 1 tablet (20 mg total) by mouth daily at 6 PM.  Dispense: 90 tablet; Refill: 1 - Lipid panel  7. Iron deficiency anemia, unspecified iron deficiency anemia type  - CBC with Differential/Platelet - Iron, TIBC and Ferritin Panel  8. Other insomnia  Taking Trazodone prn   9-Vitamin D deficiency  - VITAMIN D 25 Hydroxy (Vit-D Deficiency, Fractures)  10. B12 deficiency  - Vitamin B12 - Folate  11. Encounter for tobacco use screening  - Nicotine screen, urine - requested by bariatric center  12. Weight gain  - TSH  13. Morbid obesity, unspecified obesity type (HCC)  She denies family history of thyroid cancer, or personal history of pancreatitis, she would like to try Ozempic, discussed possible side effects - Semaglutide (OZEMPIC) 0.25 or 0.5 MG/DOSE SOPN; Inject 0.25-0.5 mg into the skin once a week.  Dispense: 2 pen; Refill: 2

## 2017-04-30 ENCOUNTER — Ambulatory Visit
Admission: RE | Admit: 2017-04-30 | Discharge: 2017-04-30 | Disposition: A | Discharge status: Home / Self Care | Payer: BLUE CROSS/BLUE SHIELD | Source: Ambulatory Visit | Attending: Surgery | Admitting: Surgery

## 2017-04-30 DIAGNOSIS — M778 Other enthesopathies, not elsewhere classified: Secondary | ICD-10-CM

## 2017-04-30 DIAGNOSIS — M75101 Unspecified rotator cuff tear or rupture of right shoulder, not specified as traumatic: Secondary | ICD-10-CM | POA: Diagnosis not present

## 2017-04-30 DIAGNOSIS — M948X1 Other specified disorders of cartilage, shoulder: Secondary | ICD-10-CM | POA: Diagnosis not present

## 2017-04-30 DIAGNOSIS — M7521 Bicipital tendinitis, right shoulder: Secondary | ICD-10-CM | POA: Diagnosis present

## 2017-04-30 DIAGNOSIS — S43401A Unspecified sprain of right shoulder joint, initial encounter: Secondary | ICD-10-CM | POA: Diagnosis not present

## 2017-04-30 DIAGNOSIS — M7581 Other shoulder lesions, right shoulder: Secondary | ICD-10-CM

## 2017-05-01 DIAGNOSIS — E559 Vitamin D deficiency, unspecified: Secondary | ICD-10-CM | POA: Diagnosis not present

## 2017-05-01 DIAGNOSIS — E785 Hyperlipidemia, unspecified: Secondary | ICD-10-CM | POA: Diagnosis not present

## 2017-05-01 DIAGNOSIS — D509 Iron deficiency anemia, unspecified: Secondary | ICD-10-CM | POA: Diagnosis not present

## 2017-05-01 DIAGNOSIS — E538 Deficiency of other specified B group vitamins: Secondary | ICD-10-CM | POA: Diagnosis not present

## 2017-05-01 LAB — CBC WITH DIFFERENTIAL/PLATELET
Basophils Absolute: 0 cells/uL (ref 0–200)
Basophils Relative: 0 %
Eosinophils Absolute: 96 cells/uL (ref 15–500)
Eosinophils Relative: 2 %
HCT: 35.7 % (ref 35.0–45.0)
Hemoglobin: 11.9 g/dL (ref 11.7–15.5)
Lymphocytes Relative: 32 %
Lymphs Abs: 1536 cells/uL (ref 850–3900)
MCH: 28.3 pg (ref 27.0–33.0)
MCHC: 33.3 g/dL (ref 32.0–36.0)
MCV: 84.8 fL (ref 80.0–100.0)
MPV: 9.7 fL (ref 7.5–12.5)
Monocytes Absolute: 384 cells/uL (ref 200–950)
Monocytes Relative: 8 %
Neutro Abs: 2784 cells/uL (ref 1500–7800)
Neutrophils Relative %: 58 %
Platelets: 273 10*3/uL (ref 140–400)
RBC: 4.21 MIL/uL (ref 3.80–5.10)
RDW: 15.1 % — ABNORMAL HIGH (ref 11.0–15.0)
WBC: 4.8 10*3/uL (ref 3.8–10.8)

## 2017-05-02 LAB — LIPID PANEL
Cholesterol: 209 mg/dL — ABNORMAL HIGH (ref <200)
HDL: 70 mg/dL (ref >50)
LDL Cholesterol: 118 mg/dL — ABNORMAL HIGH (ref <100)
Total CHOL/HDL Ratio: 3 Ratio (ref <5.0)
Triglycerides: 105 mg/dL (ref <150)
VLDL: 21 mg/dL (ref <30)

## 2017-05-02 LAB — IRON,TIBC AND FERRITIN PANEL
%SAT: 16 % (ref 11–50)
Ferritin: 11 ng/mL (ref 10–232)
Iron: 76 ug/dL (ref 45–160)
TIBC: 469 ug/dL — ABNORMAL HIGH (ref 250–450)

## 2017-05-02 LAB — COMPLETE METABOLIC PANEL WITH GFR
ALT: 8 U/L (ref 6–29)
AST: 14 U/L (ref 10–35)
Albumin: 3.9 g/dL (ref 3.6–5.1)
Alkaline Phosphatase: 109 U/L (ref 33–130)
BUN: 12 mg/dL (ref 7–25)
CO2: 28 mmol/L (ref 20–31)
Calcium: 9 mg/dL (ref 8.6–10.4)
Chloride: 104 mmol/L (ref 98–110)
Creat: 0.75 mg/dL (ref 0.50–1.05)
GFR, Est African American: >89 mL/min (ref >=60)
GFR, Est Non African American: >89 mL/min (ref >=60)
Glucose, Bld: 83 mg/dL (ref 65–99)
Potassium: 4.3 mmol/L (ref 3.5–5.3)
Sodium: 138 mmol/L (ref 135–146)
Total Bilirubin: 0.3 mg/dL (ref 0.2–1.2)
Total Protein: 6.5 g/dL (ref 6.1–8.1)

## 2017-05-02 LAB — FOLATE: Folate: 4.8 ng/mL — ABNORMAL LOW (ref >5.4)

## 2017-05-02 LAB — HEMOGLOBIN A1C
Hgb A1c MFr Bld: 5.9 % — ABNORMAL HIGH (ref <5.7)
Mean Plasma Glucose: 123 mg/dL

## 2017-05-02 LAB — VITAMIN B12: Vitamin B-12: 222 pg/mL (ref 200–1100)

## 2017-05-02 LAB — TSH: TSH: 1.89 mIU/L

## 2017-05-02 LAB — INSULIN, FASTING: Insulin fasting, serum: 7.8 u[IU]/mL (ref 2.0–19.6)

## 2017-05-03 LAB — VITAMIN D 25 HYDROXY (VIT D DEFICIENCY, FRACTURES): Vit D, 25-Hydroxy: 13 ng/mL — ABNORMAL LOW (ref 30–100)

## 2017-05-07 LAB — NICOTINE AND COTININE LC/MS/MS, U
COTININE, URINE: <2 ng/mL
NICOTINE, URINE: <2 ng/mL

## 2017-05-15 DIAGNOSIS — M7521 Bicipital tendinitis, right shoulder: Secondary | ICD-10-CM | POA: Diagnosis not present

## 2017-05-15 DIAGNOSIS — M7581 Other shoulder lesions, right shoulder: Secondary | ICD-10-CM | POA: Diagnosis not present

## 2017-05-31 DIAGNOSIS — H1045 Other chronic allergic conjunctivitis: Secondary | ICD-10-CM | POA: Diagnosis not present

## 2017-07-08 ENCOUNTER — Other Ambulatory Visit: Payer: Self-pay | Admitting: Family Medicine

## 2017-07-08 DIAGNOSIS — K219 Gastro-esophageal reflux disease without esophagitis: Secondary | ICD-10-CM

## 2017-07-26 ENCOUNTER — Ambulatory Visit (INDEPENDENT_AMBULATORY_CARE_PROVIDER_SITE_OTHER): Payer: BLUE CROSS/BLUE SHIELD | Admitting: Family Medicine

## 2017-07-26 ENCOUNTER — Encounter: Payer: Self-pay | Admitting: Family Medicine

## 2017-07-26 VITALS — BP 124/78 | HR 60 | Temp 98.3°F | Resp 16 | Ht 67.0 in | Wt 246.3 lb

## 2017-07-26 DIAGNOSIS — R7303 Prediabetes: Secondary | ICD-10-CM

## 2017-07-26 DIAGNOSIS — Z9884 Bariatric surgery status: Secondary | ICD-10-CM

## 2017-07-26 DIAGNOSIS — K219 Gastro-esophageal reflux disease without esophagitis: Secondary | ICD-10-CM | POA: Diagnosis not present

## 2017-07-26 DIAGNOSIS — E559 Vitamin D deficiency, unspecified: Secondary | ICD-10-CM | POA: Diagnosis not present

## 2017-07-26 DIAGNOSIS — E538 Deficiency of other specified B group vitamins: Secondary | ICD-10-CM

## 2017-07-26 DIAGNOSIS — R739 Hyperglycemia, unspecified: Secondary | ICD-10-CM | POA: Diagnosis not present

## 2017-07-26 DIAGNOSIS — M25561 Pain in right knee: Secondary | ICD-10-CM | POA: Diagnosis not present

## 2017-07-26 DIAGNOSIS — G8929 Other chronic pain: Secondary | ICD-10-CM | POA: Diagnosis not present

## 2017-07-26 DIAGNOSIS — K429 Umbilical hernia without obstruction or gangrene: Secondary | ICD-10-CM

## 2017-07-26 DIAGNOSIS — I10 Essential (primary) hypertension: Secondary | ICD-10-CM | POA: Diagnosis not present

## 2017-07-26 DIAGNOSIS — Z23 Encounter for immunization: Secondary | ICD-10-CM

## 2017-07-26 MED ORDER — SEMAGLUTIDE(0.25 OR 0.5MG/DOS) 2 MG/1.5ML ~~LOC~~ SOPN: 1.0000 mg | PEN_INJECTOR | SUBCUTANEOUS | 2 refills | Status: DC

## 2017-07-26 NOTE — Patient Instructions (Signed)
Please check your Blood Pressure weekly, if it is above 140/90, please call our office and we will consider restarting your medication.

## 2017-07-26 NOTE — Progress Notes (Signed)
Name: Savannah Manning   MRN: [id number]    DOB: 1961-03-27   Date:07/26/2017       Progress Note  Subjective  Chief Complaint  Chief Complaint  Patient presents with  . Hypertension    3 month follow up  . Gastroesophageal Reflux  . Obesity  . Insomnia  . Flu Vaccine    HPI  Obesity: She started Ozempic 04/2017 and has been doing well - had nausea at first, but this has improved. She has lost 14.5lbs since starting this medication.  She has been obese since childhood. She has tried multiple diets in the past, she hadgastric bypass in 2005. Weight before surgery was 314 lbs, she went down to 164 lbs, but has been gradually gaining weight, gained about 100 lbs back. She states her weight has gone up since she started to work from home since 2010.  She had her surgery done at [facility], and was denied for a revision in the summer 2018.  She has failed Metformin, gained 9 lbs while on it since 12/2016.  She denies polyphagia, polydipsia or polyuria.  Does not exercise now that summer is over - considered joining a gym but doesn't have time.  Has been eating breakfast, usually skips lunch, not always hungry for dinner; does snack sometimes.  She'd like to try the higher dose of Ozempic  Iron deficiency anemia: she had bypass surgery in 2005 and developed anemia a few years later, she had infusions in the past. Currently She still has mild pica (craves [place] starch occasionally now), no longer chewing ice. She does like red meat, eating chicken, and green vegetables (collards, salads etc.), occasionally beans. No blood in stools.  GERD: she is doing well now, taking Omeprazole most nights, symptoms of epigastric burning and occasionally regurgitation at night while sleeping.  Does elevate HOB but this doesn't help.  Stopped eating any red sauces and spicy foods.  Also discussed fried/fatty foods/heavy meals.  Umbilical hernia: she has a growth on abdominal area, that changes in size. Occasionally causes  pain, epigastric cramping. Discussed repair with [facility] surgeon when she discussed her bypass revision, and she says she wants to call back to discuss further.  Hyperlipidemia: taking simvastatin, Last LDL was improved (118 in June 2018 ) . No myalgias, chest pain or shortness of breath.  HTN:. She denies chest pain; she no longer has palpitations, and she has not been taking her Lopressor in a few months, and BP is at goal today.  She would like to remain off and see how she does.  Gets occasional migraines, but has not had any abnormal headaches, no vision changes, no BLE swelling.  Right knee pain: she states same symptoms that she had prior to bypass surgery, aching and stiff right knee. No redness or increase in warmth.   B12 and vitamin D deficiency: She is on MVI, vitamin and extra vitamin D and also B12 but not sub-lingual type. Levels were checked June 2018 and B12 was normal, Vitamin D was low.  Will continue supplementation.  Patient Active Problem List   Diagnosis Date Noted  . Vitamin D deficiency 04/24/2017  . B12 deficiency 04/24/2017  . Abnormal mammogram of right breast 01/01/2017  . Asthma, mild persistent 12/17/2016  . History of shoulder surgery 10/12/2016  . Primary osteoarthritis of left shoulder 06/14/2016  . Incomplete tear of left rotator cuff 05/30/2016  . Rotator cuff tendinitis 05/30/2016  . History of bariatric surgery 05/04/2016  . Allergic rhinitis 05/12/2015  .  Benign essential HTN 05/12/2015  . Anxiety and depression 05/12/2015  . Insomnia, persistent 05/12/2015  . Dyslipidemia 05/12/2015  . Edema extremities 05/12/2015  . Gastro-esophageal reflux disease without esophagitis 05/12/2015  . Hypoglycemia 05/12/2015  . Eczema intertrigo 05/12/2015  . Anemia, iron deficiency 05/12/2015  . Migraine 05/12/2015  . Plantar fasciitis 05/12/2015  . Umbilical hernia without obstruction and without gangrene 05/12/2015  . Extreme obesity 02/13/2008    Past  Surgical History:  Procedure Laterality Date  . ABDOMINAL HYSTERECTOMY  2012  . CHOLECYSTECTOMY    . DILATION AND CURETTAGE OF UTERUS    . GASTRIC BYPASS  2005  . INDUCED ABORTION N/A 1997   patient was about 22 weeks  . SHOULDER ARTHROSCOPY WITH OPEN ROTATOR CUFF REPAIR Left 06/14/2016   Procedure: SHOULDER ARTHROSCOPY WITH OPEN ROTATOR CUFF REPAIR, DECOMPRESSION, EXCISION LOOSE BODY, DEBRIDEMENT;  Surgeon: Corky Mull, MD;  Location: [facility] ORS;  Service: Orthopedics;  Laterality: Left;    Family History  Problem Relation Age of Onset  . Hypertension Mother   . Cancer Father        prostate  . Heart disease Brother   . Breast cancer Cousin 46    Social History   Social History  . Marital status: Single    Spouse name: N/A  . Number of children: N/A  . Years of education: N/A   Occupational History  . Not on file.   Social History Main Topics  . Smoking status: Never Smoker  . Smokeless tobacco: Never Used  . Alcohol use No  . Drug use: No  . Sexual activity: Not Currently   Other Topics Concern  . Not on file   Social History Narrative  . No narrative on file     Current Outpatient Prescriptions:  .  Butalbital-APAP-Caffeine 50-300-40 MG CAPS, Take 1 capsule by mouth daily as needed (migraine). , Disp: , Rfl:  .  fluticasone (FLONASE) 50 MCG/ACT nasal spray, Place 2 sprays into both nostrils daily., Disp: 16 g, Rfl: 0 .  Loratadine 10 MG CAPS, Take 1 capsule by mouth as needed., Disp: , Rfl:  .  metoprolol tartrate (LOPRESSOR) 25 MG tablet, Take 1 tablet (25 mg total) by mouth 2 (two) times daily., Disp: 180 tablet, Rfl: 1 .  omeprazole (PRILOSEC) 40 MG capsule, TAKE 1 CAPSULE(40 MG) BY MOUTH DAILY, Disp: 90 capsule, Rfl: 1 .  Semaglutide (OZEMPIC) 0.25 or 0.5 MG/DOSE SOPN, Inject 0.25-0.5 mg into the skin once a week., Disp: 2 pen, Rfl: 2 .  simvastatin (ZOCOR) 20 MG tablet, Take 1 tablet (20 mg total) by mouth daily at 6 PM., Disp: 90 tablet, Rfl: 1 .   traZODone (DESYREL) 50 MG tablet, Take 1 tablet (50 mg total) by mouth at bedtime., Disp: 90 tablet, Rfl: 1 .  Vitamin D, Cholecalciferol, 1000 units CAPS, Take 1 capsule by mouth daily., Disp: , Rfl:   Allergies  Allergen Reactions  . Lisinopril Swelling    Facial Swelling  . Ace Inhibitors     angioedema  . Codeine Swelling  . Valsartan Swelling     ROS  Constitutional: Negative for fever or weight change.  Respiratory: Negative for cough and shortness of breath.   Cardiovascular: Negative for chest pain or palpitations.  Gastrointestinal: Negative for abdominal pain, no bowel changes.  Musculoskeletal: Negative for gait problem or joint swelling.  Skin: Negative for rash.  Neurological: Negative for dizziness or headache.  No other specific complaints in a complete review of systems (except  as listed in HPI above).  Objective  Vitals:   07/26/17 0906  BP: 124/78  Pulse: 60  Resp: 16  Temp: 98.3 F (36.8 C)  SpO2: 99%  Weight: 246 lb 5 oz (111.7 kg)  Height: '5\' 7"'  (1.702 m)   Body mass index is 38.58 kg/m.  Physical Exam Constitutional: Patient appears well-developed and well-nourished. Obese No distress.  HEENT: head atraumatic, normocephalic, pupils equal and reactive to light, neck supple, throat within normal limits Cardiovascular: Normal rate, regular rhythm and normal heart sounds.  No murmur heard. No BLE edema. Pulmonary/Chest: Effort normal and breath sounds normal. No respiratory distress. Abdominal: Soft.  There is no tenderness.  Umbilical hernia present and non-tender, no discoloration. Psychiatric: Patient has a normal mood and affect. behavior is normal. Judgment and thought content normal.  Recent Results (from the past 2160 hour(s))  Lipid panel     Status: Abnormal   Collection Time: 05/01/17  8:19 AM  Result Value Ref Range   Cholesterol 209 (H) <200 mg/dL   Triglycerides 105 <150 mg/dL   HDL 70 >50 mg/dL   Total CHOL/HDL Ratio 3.0 <5.0  Ratio   VLDL 21 <30 mg/dL   LDL Cholesterol 118 (H) <100 mg/dL  Insulin, fasting     Status: None   Collection Time: 05/01/17  8:19 AM  Result Value Ref Range   Insulin fasting, serum 7.8 2.0 - 19.6 uIU/mL    Comment:   This insulin assay shows strong cross-reactivity for some insulin analogs (lispro, aspart, and glargine) and much lower cross-reactivity with others (detemir, glulisine).   Stimulated Insulin reference intervals were established using the Siemens Immulite assay. These values are provided for general guidance only.   Hemoglobin A1c     Status: Abnormal   Collection Time: 05/01/17  8:19 AM  Result Value Ref Range   Hgb A1c MFr Bld 5.9 (H) <5.7 %    Comment:   For someone without known diabetes, a hemoglobin A1c value between 5.7% and 6.4% is consistent with prediabetes and should be confirmed with a follow-up test.   For someone with known diabetes, a value <7% indicates that their diabetes is well controlled. A1c targets should be individualized based on duration of diabetes, age, co-morbid conditions and other considerations.   This assay result is consistent with an increased risk of diabetes.   Currently, no consensus exists regarding use of hemoglobin A1c for diagnosis of diabetes in children.      Mean Plasma Glucose 123 mg/dL  CBC with Differential/Platelet     Status: Abnormal   Collection Time: 05/01/17  8:19 AM  Result Value Ref Range   WBC 4.8 3.8 - 10.8 K/uL   RBC 4.21 3.80 - 5.10 MIL/uL   Hemoglobin 11.9 11.7 - 15.5 g/dL   HCT 35.7 35.0 - 45.0 %   MCV 84.8 80.0 - 100.0 fL   MCH 28.3 27.0 - 33.0 pg   MCHC 33.3 32.0 - 36.0 g/dL   RDW 15.1 (H) 11.0 - 15.0 %   Platelets 273 140 - 400 K/uL   MPV 9.7 7.5 - 12.5 fL   Neutro Abs 2,784 1,500 - 7,800 cells/uL   Lymphs Abs 1,536 850 - 3,900 cells/uL   Monocytes Absolute 384 200 - 950 cells/uL   Eosinophils Absolute 96 15 - 500 cells/uL   Basophils Absolute 0 0 - 200 cells/uL   Neutrophils  Relative % 58 %   Lymphocytes Relative 32 %   Monocytes Relative 8 %  Eosinophils Relative 2 %   Basophils Relative 0 %   Smear Review Criteria for review not met   COMPLETE METABOLIC PANEL WITH GFR     Status: None   Collection Time: 05/01/17  8:19 AM  Result Value Ref Range   Sodium 138 135 - 146 mmol/L   Potassium 4.3 3.5 - 5.3 mmol/L   Chloride 104 98 - 110 mmol/L   CO2 28 20 - 31 mmol/L   Glucose, Bld 83 65 - 99 mg/dL   BUN 12 7 - 25 mg/dL   Creat 0.75 0.50 - 1.05 mg/dL    Comment:   For patients > or = 54 years of age: The upper reference limit for Creatinine is approximately 13% higher for people identified as African-American.      Total Bilirubin 0.3 0.2 - 1.2 mg/dL   Alkaline Phosphatase 109 33 - 130 U/L   AST 14 10 - 35 U/L   ALT 8 6 - 29 U/L   Total Protein 6.5 6.1 - 8.1 g/dL   Albumin 3.9 3.6 - 5.1 g/dL   Calcium 9.0 8.6 - 10.4 mg/dL   GFR, Est African American >89 >=60 mL/min   GFR, Est Non African American >89 >=60 mL/min  TSH     Status: None   Collection Time: 05/01/17  8:19 AM  Result Value Ref Range   TSH 1.89 mIU/L    Comment:   Reference Range   > or = 20 Years  0.40-4.50   Pregnancy Range First trimester  0.26-2.66 Second trimester 0.55-2.73 Third trimester  0.43-2.91     Vitamin B12     Status: None   Collection Time: 05/01/17  8:19 AM  Result Value Ref Range   Vitamin B-12 222 200 - 1,100 pg/mL  VITAMIN D 25 Hydroxy (Vit-D Deficiency, Fractures)     Status: Abnormal   Collection Time: 05/01/17  8:19 AM  Result Value Ref Range   Vit D, 25-Hydroxy 13 (L) 30 - 100 ng/mL    Comment: Vitamin D Status           25-OH Vitamin D        Deficiency                <20 ng/mL        Insufficiency         20 - 29 ng/mL        Optimal             > or = 30 ng/mL   For 25-OH Vitamin D testing on patients on D2-supplementation and patients for whom quantitation of D2 and D3 fractions is required, the QuestAssureD 25-OH VIT D, (D2,D3), LC/MS/MS is  recommended: order code [phone] (patients > 2 yrs).   Folate     Status: Abnormal   Collection Time: 05/01/17  8:19 AM  Result Value Ref Range   Folate 4.8 (L) >5.4 ng/mL    Comment: Reference Range >17 years:   Low: <3.4 ng/mL              Borderline: 3.4-5.4 ng/mL              Normal: >5.4 ng/mL     Iron, TIBC and Ferritin Panel     Status: Abnormal   Collection Time: 05/01/17  8:19 AM  Result Value Ref Range   Ferritin 11 10 - 232 ng/mL   Iron 76 45 - 160 ug/dL   TIBC 469 (H) 250 - 450 ug/dL   %  SAT 16 11 - 50 %  Nicotine and Cotinine LC/MS/MS, U     Status: None   Collection Time: 05/01/17  8:19 AM  Result Value Ref Range   NICOTINE, URINE <2 ng/mL   COTININE, URINE <2 ng/mL    Comment:                      Reference Range:                             Nicotine, Urine                              Smokers: 200-700 ng/mL                           Nonsmokers: < or = 17 ng/mL                             Cotinine, Urine                              Smokers: [phone] ng/mL                           Nonsmokers: < or = 20 ng/mL Individuals exposed to second-hand or passive tobacco smoke may demonstrate concentrations of nicotine and cotinine greater than those indicated for non-smokers. This test was developed and its analytical performance characteristics have been determined by [place], [place]. It has not been cleared or approved by the U.S. Food and Drug Administration. This assay has been validated pursuant to the CLIA regulations and is used for clinical purposes.    PHQ2/9: Depression screen [facility] 2/9 04/24/2017 05/04/2016 03/22/2016 09/22/2015 05/13/2015  Decreased Interest 0 0 0 0 0  Down, Depressed, Hopeless 0 0 0 0 0  PHQ - 2 Score 0 0 0 0 0   Fall Risk: Fall Risk  04/24/2017 05/04/2016 03/22/2016 09/22/2015 05/13/2015  Falls in the past year? No No No No No    Assessment & Plan  1. Need for influenza vaccination - Flu Vaccine QUAD 6+  mos PF IM (Fluarix Quad PF)  2. B12 deficiency Continue Supplementation  3. Benign essential HTN Monitor symptoms, if chest pain or palpitations occur, will check BP once a week and report if elevated above 140/90  4. Morbid obesity, unspecified obesity type ([place]) - Semaglutide (OZEMPIC) 0.25 or 0.5 MG/DOSE SOPN; Inject 1 mg into the skin once a week.  Dispense: 2 pen; Refill: 2  5. Gastro-esophageal reflux disease without esophagitis Continue Omeprazole  6. History of bariatric surgery Stable  7. Umbilical hernia without obstruction and without gangrene Follow up with [facility]  8. Vitamin D deficiency Continue Vitamin D Supplementation  9. Chronic pain of right knee Discussed weight loss, gentle stretching, and exercises to strengthen.  10. Hyperglycemia - Semaglutide (OZEMPIC) 0.25 or 0.5 MG/DOSE SOPN; Inject 1 mg into the skin once a week.  Dispense: 2 pen; Refill: 2  11. Pre-diabetes - Semaglutide (OZEMPIC) 0.25 or 0.5 MG/DOSE SOPN; Inject 1 mg into the skin once a week.  Dispense: 2 pen; Refill: 2

## 2017-10-01 ENCOUNTER — Telehealth: Payer: Self-pay

## 2017-10-01 ENCOUNTER — Other Ambulatory Visit: Payer: Self-pay | Admitting: Obstetrics and Gynecology

## 2017-10-01 DIAGNOSIS — Z1239 Encounter for other screening for malignant neoplasm of breast: Secondary | ICD-10-CM

## 2017-10-01 NOTE — Telephone Encounter (Signed)
Pt called [facility] to schedule Mammo & was directed to contact us to schedule that apt as they need an order. She has her AE 10/15/17. She would like mammo the same day. ZH#[phone]#[phone].

## 2017-10-01 NOTE — Telephone Encounter (Signed)
Pt aware.

## 2017-10-01 NOTE — Telephone Encounter (Signed)
Order placed

## 2017-10-01 NOTE — Progress Notes (Signed)
Pt need mammo order to [facility] same day as annual.

## 2017-10-01 NOTE — Telephone Encounter (Signed)
Pls let pt know order placed. She can now call back to sched mammo. Thx

## 2017-10-04 ENCOUNTER — Other Ambulatory Visit: Payer: Self-pay | Admitting: Family Medicine

## 2017-10-04 DIAGNOSIS — R928 Other abnormal and inconclusive findings on diagnostic imaging of breast: Secondary | ICD-10-CM

## 2017-10-04 NOTE — Progress Notes (Signed)
Received request for the following to evaluate prior abnormal mammogram from Tracie HarrierDubosky, Brittany D:  [id number] DIAG BILAT TOMO   [id number] LEFT BR US   [id number] RT BR US    Orders are placed.

## 2017-10-15 ENCOUNTER — Ambulatory Visit (INDEPENDENT_AMBULATORY_CARE_PROVIDER_SITE_OTHER): Payer: BLUE CROSS/BLUE SHIELD | Admitting: Obstetrics and Gynecology

## 2017-10-15 ENCOUNTER — Encounter: Payer: Self-pay | Admitting: Obstetrics and Gynecology

## 2017-10-15 VITALS — BP 138/90 | HR 61 | Ht 67.0 in | Wt 236.0 lb

## 2017-10-15 DIAGNOSIS — Z124 Encounter for screening for malignant neoplasm of cervix: Secondary | ICD-10-CM | POA: Diagnosis not present

## 2017-10-15 DIAGNOSIS — Z1239 Encounter for other screening for malignant neoplasm of breast: Secondary | ICD-10-CM

## 2017-10-15 DIAGNOSIS — Z01419 Encounter for gynecological examination (general) (routine) without abnormal findings: Secondary | ICD-10-CM | POA: Diagnosis not present

## 2017-10-15 DIAGNOSIS — K429 Umbilical hernia without obstruction or gangrene: Secondary | ICD-10-CM | POA: Diagnosis not present

## 2017-10-15 DIAGNOSIS — Z1151 Encounter for screening for human papillomavirus (HPV): Secondary | ICD-10-CM | POA: Diagnosis not present

## 2017-10-15 DIAGNOSIS — Z1231 Encounter for screening mammogram for malignant neoplasm of breast: Secondary | ICD-10-CM | POA: Diagnosis not present

## 2017-10-15 LAB — HM PAP SMEAR: HM Pap smear: NORMAL

## 2017-10-15 NOTE — Addendum Note (Signed)
Addended by: Althea GrimmerOPLAND, ALICIA B on: 10/15/2017 02:52 PM   Modules accepted: Orders

## 2017-10-15 NOTE — Progress Notes (Addendum)
PCP: Alba CorySowles, Krichna, MD   Chief Complaint  Patient presents with  . Gynecologic Exam    HPI:      Ms. Hubbard HartshornVondra D Aybar is a 56 y.o. G2P0011 who LMP was No LMP recorded. Patient has had a hysterectomy., presents today for her annual examination.  Her menses are absent due to hyst/menopause. She does not have intermenstrual bleeding.  She does not have vasomotor sx.  Sex activity: not sexually active. She does not have vaginal dryness.  Last Pap: July 21, 2014  Results were: no abnormalities /neg HPV DNA.  Hx of STDs: none  Last mammogram: June 04, 2016  Results were: Cat 4 RT breast calcifications. Pt was sched for bx 06/06/17 but calcifications were vascular, so bx not done. Repeat mammo 3/18 was stable. Pt has mammo sched 10/22/17. There is a FH of breast cancer in her pat aunt, genetic testing not indicated. There is no FH of ovarian cancer. The patient does do self-breast exams.  Colonoscopy: colonoscopy 3 years ago without abnormalities. . Repeat due after 10 years.   Tobacco use: The patient denies current or previous tobacco use. Alcohol use: none Exercise: not active  She does get adequate calcium and Vitamin D in her diet.  Labs with PCP.  Pt with umbilical hernia. Can be uncomfortable. Pt also has bad GERD and wonders if related. Was going to have repaired with gastric bypass revision but insurance wouldn't cover revision, so pt never pursued hernia repair. Pt can reduce hernia.  Past Medical History:  Diagnosis Date  . Anemia   . Arthritis   . Asthma   . GERD (gastroesophageal reflux disease)   . Headache   . Hypertension     Past Surgical History:  Procedure Laterality Date  . ABDOMINAL HYSTERECTOMY  2012  . CHOLECYSTECTOMY    . DILATION AND CURETTAGE OF UTERUS    . GASTRIC BYPASS  2005  . INDUCED ABORTION N/A 1997   patient was about 22 weeks  . SHOULDER ARTHROSCOPY WITH OPEN ROTATOR CUFF REPAIR Left 06/14/2016   Procedure: SHOULDER ARTHROSCOPY WITH  OPEN ROTATOR CUFF REPAIR, DECOMPRESSION, EXCISION LOOSE BODY, DEBRIDEMENT;  Surgeon: Christena FlakeJohn J Poggi, MD;  Location: [facility] ORS;  Service: Orthopedics;  Laterality: Left;    Family History  Problem Relation Age of Onset  . Hypertension Mother   . Cancer Father        prostate  . Heart disease Brother   . Breast cancer Cousin 40  . Breast cancer Paternal Aunt 4450    Social History   Socioeconomic History  . Marital status: Single    Spouse name: Not on file  . Number of children: Not on file  . Years of education: Not on file  . Highest education level: Not on file  Social Needs  . Financial resource strain: Not on file  . Food insecurity - worry: Not on file  . Food insecurity - inability: Not on file  . Transportation needs - medical: Not on file  . Transportation needs - non-medical: Not on file  Occupational History  . Not on file  Tobacco Use  . Smoking status: Never Smoker  . Smokeless tobacco: Never Used  Substance and Sexual Activity  . Alcohol use: No    Alcohol/week: 0.0 oz  . Drug use: No  . Sexual activity: Not Currently  Other Topics Concern  . Not on file  Social History Narrative  . Not on file    Current Meds  Medication  Sig  . Butalbital-APAP-Caffeine 50-300-40 MG CAPS Take 1 capsule by mouth daily as needed (migraine).   . fluticasone (FLONASE) 50 MCG/ACT nasal spray Place 2 sprays into both nostrils daily.  . Loratadine 10 MG CAPS Take 1 capsule by mouth as needed.  [name]. omeprazole (PRILOSEC) 40 MG capsule TAKE 1 CAPSULE(40 MG) BY MOUTH DAILY  . Semaglutide (OZEMPIC) 0.25 or 0.5 MG/DOSE SOPN Inject 1 mg into the skin once a week.  . simvastatin (ZOCOR) 20 MG tablet Take 1 tablet (20 mg total) by mouth daily at 6 PM.  . traZODone (DESYREL) 50 MG tablet Take 1 tablet (50 mg total) by mouth at bedtime.  . Vitamin D, Cholecalciferol, 1000 units CAPS Take 1 capsule by mouth daily.      ROS:  Review of Systems  Constitutional: Negative for fatigue, fever  and unexpected weight change.  Respiratory: Negative for cough, shortness of breath and wheezing.   Cardiovascular: Negative for chest pain, palpitations and leg swelling.  Gastrointestinal: Negative for blood in stool, constipation, diarrhea, nausea and vomiting.  Endocrine: Negative for cold intolerance, heat intolerance and polyuria.  Genitourinary: Negative for dyspareunia, dysuria, flank pain, frequency, genital sores, hematuria, menstrual problem, pelvic pain, urgency, vaginal bleeding, vaginal discharge and vaginal pain.  Musculoskeletal: Negative for back pain, joint swelling and myalgias.  Skin: Negative for rash.  Neurological: Negative for dizziness, syncope, light-headedness, numbness and headaches.  Hematological: Negative for adenopathy.  Psychiatric/Behavioral: Negative for agitation, confusion, sleep disturbance and suicidal ideas. The patient is not nervous/anxious.      Objective: BP 138/90   Pulse 61   Ht 5\' 7"  (1.702 m)   Wt 236 lb (107 kg)   BMI 36.96 kg/m    Physical Exam  Constitutional: She is oriented to person, place, and time. She appears well-developed and well-nourished.  Genitourinary: Vagina normal. There is no rash or tenderness on the right labia. There is no rash or tenderness on the left labia. No erythema or tenderness in the vagina. No vaginal discharge found. Right adnexum does not display mass and does not display tenderness. Left adnexum does not display mass and does not display tenderness.  Genitourinary Comments: UTERUS/CX SURG REM  Neck: Normal range of motion. No thyromegaly present.  Cardiovascular: Normal rate, regular rhythm and normal heart sounds.  No murmur heard. Pulmonary/Chest: Effort normal and breath sounds normal. Right breast exhibits no mass, no nipple discharge, no skin change and no tenderness. Left breast exhibits no mass, no nipple discharge, no skin change and no tenderness.  Abdominal: Soft. There is no tenderness. There  is no guarding. A hernia is present.    UMB HERNIA, REDUCIBLE  Musculoskeletal: Normal range of motion.  Neurological: She is alert and oriented to person, place, and time. No cranial nerve deficit.  Psychiatric: She has a normal mood and affect. Her behavior is normal.  Vitals reviewed.   Assessment/Plan:  Encounter for annual routine gynecological examination  Cervical cancer screening - Plan: IGP, Aptima HPV  Screening for HPV (human papillomavirus) - Plan: IGP, Aptima HPV  Screening for breast cancer - Pt has mammo sched.   Umbilical hernia without obstruction and without gangrene - Refer to Dr. Lemar LivingsByrnett for mgmt. - Plan: Ambulatory referral to General Surgery        GYN counsel breast self exam, mammography screening, menopause, adequate intake of calcium and vitamin D, diet and exercise    F/U  Return in about 1 year (around 10/15/2018).  Kieana Livesay B. Kolette Vey, PA-C 10/15/2017 2:52  PM

## 2017-10-15 NOTE — Patient Instructions (Signed)
I value your feedback and entrusting us with your care. If you get a [facility] patient survey, I would appreciate you taking the time to let us know about your experience today. Thank you! 

## 2017-10-17 ENCOUNTER — Telehealth: Payer: Self-pay | Admitting: Family Medicine

## 2017-10-17 NOTE — Telephone Encounter (Signed)
Copied from CRM #[id number]. Topic: Quick Communication - See Telephone Encounter >> Oct 17, 2017  3:41 PM Arlyss Gandyichardson, Linsey Hirota N, NT wrote: CRM for notification. See Telephone encounter for: Pt needing a refill of her Prilosec refilled. [place] [facility] [place].   10/17/17.

## 2017-10-18 ENCOUNTER — Other Ambulatory Visit: Payer: Self-pay

## 2017-10-18 DIAGNOSIS — K219 Gastro-esophageal reflux disease without esophagitis: Secondary | ICD-10-CM

## 2017-10-18 LAB — IGP, APTIMA HPV
HPV Aptima: NEGATIVE
PAP Smear Comment: 0

## 2017-10-18 NOTE — Telephone Encounter (Signed)
Refill request for general medication: Prilosec 40 mg  Last office visit: 07/26/2017  Last physical exam: None indicated

## 2017-10-20 MED ORDER — OMEPRAZOLE 40 MG PO CPDR: DELAYED_RELEASE_CAPSULE | ORAL | 1 refills | Status: DC

## 2017-10-22 ENCOUNTER — Ambulatory Visit
Admission: RE | Admit: 2017-10-22 | Discharge: 2017-10-22 | Disposition: A | Discharge status: Home / Self Care | Payer: BLUE CROSS/BLUE SHIELD | Source: Ambulatory Visit | Attending: Family Medicine | Admitting: Family Medicine

## 2017-10-22 ENCOUNTER — Other Ambulatory Visit: Payer: BLUE CROSS/BLUE SHIELD

## 2017-10-22 DIAGNOSIS — R921 Mammographic calcification found on diagnostic imaging of breast: Secondary | ICD-10-CM | POA: Diagnosis not present

## 2017-10-22 DIAGNOSIS — R928 Other abnormal and inconclusive findings on diagnostic imaging of breast: Secondary | ICD-10-CM

## 2017-10-27 ENCOUNTER — Other Ambulatory Visit: Payer: Self-pay | Admitting: Family Medicine

## 2017-10-27 DIAGNOSIS — E785 Hyperlipidemia, unspecified: Secondary | ICD-10-CM

## 2017-10-27 DIAGNOSIS — R7303 Prediabetes: Secondary | ICD-10-CM

## 2017-10-27 DIAGNOSIS — R739 Hyperglycemia, unspecified: Secondary | ICD-10-CM

## 2017-10-28 NOTE — Telephone Encounter (Signed)
Refill request for general medication: Vitamin D and Simvastatin.   Last office visit: 07/26/2017  Follow up: 11/06/17.

## 2017-11-06 ENCOUNTER — Ambulatory Visit: Payer: BLUE CROSS/BLUE SHIELD | Admitting: Family Medicine

## 2017-11-06 ENCOUNTER — Encounter: Payer: Self-pay | Admitting: Family Medicine

## 2017-11-06 VITALS — BP 130/90 | HR 58 | Resp 16 | Ht 67.0 in | Wt 228.3 lb

## 2017-11-06 DIAGNOSIS — K219 Gastro-esophageal reflux disease without esophagitis: Secondary | ICD-10-CM

## 2017-11-06 DIAGNOSIS — G4700 Insomnia, unspecified: Secondary | ICD-10-CM | POA: Diagnosis not present

## 2017-11-06 DIAGNOSIS — I10 Essential (primary) hypertension: Secondary | ICD-10-CM | POA: Diagnosis not present

## 2017-11-06 DIAGNOSIS — R05 Cough: Secondary | ICD-10-CM

## 2017-11-06 DIAGNOSIS — R739 Hyperglycemia, unspecified: Secondary | ICD-10-CM

## 2017-11-06 DIAGNOSIS — R058 Other specified cough: Secondary | ICD-10-CM

## 2017-11-06 DIAGNOSIS — Z9884 Bariatric surgery status: Secondary | ICD-10-CM | POA: Diagnosis not present

## 2017-11-06 DIAGNOSIS — R7303 Prediabetes: Secondary | ICD-10-CM | POA: Diagnosis not present

## 2017-11-06 LAB — POCT GLYCOSYLATED HEMOGLOBIN (HGB A1C): Hemoglobin A1C: 5.6

## 2017-11-06 MED ORDER — FLUTICASONE FUROATE-VILANTEROL 100-25 MCG/INH IN AEPB: 1.0000 | INHALATION_SPRAY | Freq: Every day | RESPIRATORY_TRACT | 0 refills | Status: DC

## 2017-11-06 MED ORDER — SEMAGLUTIDE (1 MG/DOSE) 2 MG/1.5ML ~~LOC~~ SOPN: 1.0000 mL | PEN_INJECTOR | SUBCUTANEOUS | 1 refills | Status: DC

## 2017-11-06 MED ORDER — HYDROCHLOROTHIAZIDE 25 MG PO TABS: 25.0000 mg | ORAL_TABLET | Freq: Every day | ORAL | 0 refills | Status: DC

## 2017-11-06 NOTE — Progress Notes (Signed)
Name: Savannah Manning   MRN: [id number]    DOB: 01-10-1961   Date:11/06/2017       Progress Note  Subjective  Chief Complaint  Chief Complaint  Patient presents with  . Hypertension  . Obesity  . Prediabetes    HPI  Obesity: She started Ozempic 04/2017 and has been doing well - had nausea at first, but this has improved.  She has been obese since childhood. She has tried multiple diets in the past, she hadgastric bypass in 2005. Weight before surgery was 314 lbs, she went down to 164 lbs, but has been gradually gaining weight, gained about 100lbs back. She states her weight has gone up since she started to work from home since 2010.  She had her surgery done at [facility], and was denied for a revision in the summer 2018.  She has failed Metformin, gained 9 lbs while on it since 12/2016.  She denies polyphagia, polydipsia or polyuria.  Does not exercise now that summer is over - considered joining a gym but doesn't have time.  Has been eating breakfast,lunch and dinner but smaller portions. We increased dose of Ozempic 07/2017 and she is responding well. Lost a total of 31 lbs since started on medication back in June 2018  Iron deficiency anemia: she had bypass surgery in 2005 and developed anemia a few years later, she had infusions in the past. Currently denies any pica.She does like red meat, eating chicken, and green vegetables (collards, salads etc.), occasionally beans. No blood in stools.  GERD: she is doing well now, taking Omeprazole most nights, symptoms of epigastric burning and occasionally regurgitation at night while sleeping.  Does elevate HOB but this doesn't help.  Stopped eating any red sauces and spicy foods.  Also discussed fried/fatty foods/heavy meals. Discussed long term risk of PPI use. She will try switching to Zantac  Hyperlipidemia: taking simvastatin, Last LDL was improved (118 in June 2018 ) . No myalgias, chest pain or shortness of breath.  HTN:. She denies chest  pain; she no longer has palpitations, and she has not been taking her Lopressor in a few months, and BP has been trending up, she has HCTZ on her active medication list and she states she has been taking it. Explained that we will need to resume Lopressor if bp remains elevated   Right knee pain: she states same symptoms that she had prior to bypass surgery, aching and stiff right knee. No redness or increase in warmth. Denies problems with ambulation.   B12 and vitamin D deficiency: She is on MVI, vitamin and extra vitamin D and also B12 but not sub-lingual type. Levels were checked June 2018 and B12 was normal, Vitamin D was low.  Will continue supplementation.  Cough: she developed mild cold symptoms about one week ago, followed a by dry cough, no wheezing or SOB. Denies fever or chills. Normal appetite .    Patient Active Problem List   Diagnosis Date Noted  . Chronic pain of right knee 07/26/2017  . Vitamin D deficiency 04/24/2017  . B12 deficiency 04/24/2017  . Abnormal mammogram of right breast 01/01/2017  . Asthma, mild persistent 12/17/2016  . History of shoulder surgery 10/12/2016  . Primary osteoarthritis of left shoulder 06/14/2016  . Incomplete tear of left rotator cuff 05/30/2016  . Rotator cuff tendinitis 05/30/2016  . History of bariatric surgery 05/04/2016  . Allergic rhinitis 05/12/2015  . Benign essential HTN 05/12/2015  . Anxiety and depression 05/12/2015  .  Insomnia, persistent 05/12/2015  . Dyslipidemia 05/12/2015  . Edema extremities 05/12/2015  . Gastro-esophageal reflux disease without esophagitis 05/12/2015  . Hypoglycemia 05/12/2015  . Eczema intertrigo 05/12/2015  . Anemia, iron deficiency 05/12/2015  . Migraine 05/12/2015  . Plantar fasciitis 05/12/2015  . Umbilical hernia without obstruction and without gangrene 05/12/2015  . Extreme obesity 02/13/2008    Past Surgical History:  Procedure Laterality Date  . ABDOMINAL HYSTERECTOMY  2012  .  CHOLECYSTECTOMY    . DILATION AND CURETTAGE OF UTERUS    . GASTRIC BYPASS  2005  . INDUCED ABORTION N/A 1997   patient was about 22 weeks  . SHOULDER ARTHROSCOPY WITH OPEN ROTATOR CUFF REPAIR Left 06/14/2016   Procedure: SHOULDER ARTHROSCOPY WITH OPEN ROTATOR CUFF REPAIR, DECOMPRESSION, EXCISION LOOSE BODY, DEBRIDEMENT;  Surgeon: Christena Flake, MD;  Location: [facility] ORS;  Service: Orthopedics;  Laterality: Left;    Family History  Problem Relation Age of Onset  . Hypertension Mother   . Cancer Father        prostate  . Heart disease Brother   . Breast cancer Cousin 40  . Breast cancer Paternal Aunt 63    Social History   Socioeconomic History  . Marital status: Single    Spouse name: Not on file  . Number of children: 1  . Years of education: Not on file  . Highest education level: Not on file  Social Needs  . Financial resource strain: Not on file  . Food insecurity - worry: Not on file  . Food insecurity - inability: Not on file  . Transportation needs - medical: Not on file  . Transportation needs - non-medical: Not on file  Occupational History  . Not on file  Tobacco Use  . Smoking status: Never Smoker  . Smokeless tobacco: Never Used  Substance and Sexual Activity  . Alcohol use: No    Alcohol/week: 0.0 oz  . Drug use: No  . Sexual activity: Not Currently  Other Topics Concern  . Not on file  Social History Narrative  . Not on file     Current Outpatient Medications:  .  Butalbital-APAP-Caffeine 50-300-40 MG CAPS, Take 1 capsule by mouth daily as needed (migraine). , Disp: , Rfl:  .  fluticasone (FLONASE) 50 MCG/ACT nasal spray, Place 2 sprays into both nostrils daily., Disp: 16 g, Rfl: 0 .  fluticasone furoate-vilanterol (BREO ELLIPTA) 100-25 MCG/INH AEPB, Inhale 1 puff into the lungs daily., Disp: 60 each, Rfl: 0 .  hydrochlorothiazide (HYDRODIURIL) 25 MG tablet, Take 1 tablet (25 mg total) by mouth daily., Disp: 90 tablet, Rfl: 0 .  Loratadine 10 MG CAPS,  Take 1 capsule by mouth as needed., Disp: , Rfl:  .  omeprazole (PRILOSEC) 40 MG capsule, TAKE 1 CAPSULE(40 MG) BY MOUTH DAILY, Disp: 90 capsule, Rfl: 1 .  Semaglutide (OZEMPIC) 1 MG/DOSE SOPN, Inject 1 mL into the skin once a week., Disp: 9 mL, Rfl: 1 .  simvastatin (ZOCOR) 20 MG tablet, TAKE 1 TABLET(20 MG) BY MOUTH DAILY AT 6 PM, Disp: 90 tablet, Rfl: 0 .  traZODone (DESYREL) 50 MG tablet, Take 1 tablet (50 mg total) by mouth at bedtime., Disp: 90 tablet, Rfl: 1 .  Vitamin D, Ergocalciferol, (DRISDOL) 50000 units CAPS capsule, TAKE 1 CAPSULE BY MOUTH EVERY 7 DAYS, Disp: 12 capsule, Rfl: 0  Allergies  Allergen Reactions  . Lisinopril Swelling    Facial Swelling  . Ace Inhibitors     angioedema  . Codeine Swelling  .  Valsartan Swelling     ROS  Constitutional: Negative for fever , positive for weight change.  Respiratory: Positive  for cough but no  shortness of breath.   Cardiovascular: Negative for chest pain or palpitations.  Gastrointestinal: Negative for abdominal pain, no bowel changes.  Musculoskeletal: Negative for gait problem or joint swelling.  Skin: Negative for rash.  Neurological: Negative for dizziness or headache.  No other specific complaints in a complete review of systems (except as listed in HPI above).  Objective  Vitals:   11/06/17 0744  BP: 140/90  Pulse: (!) 58  Resp: 16  SpO2: 99%  Weight: 228 lb 4.8 oz (103.6 kg)  Height: 5\' 7"  (1.702 m)    Body mass index is 35.76 kg/m.  Physical Exam  Constitutional: Patient appears well-developed and well-nourished. Obese No distress.  HEENT: head atraumatic, normocephalic, pupils equal and reactive to light, ear, neck supple, throat within normal limits Cardiovascular: Normal rate, regular rhythm and normal heart sounds.  No murmur heard. No BLE edema. Pulmonary/Chest: Effort normal and breath sounds normal. No respiratory distress. Abdominal: Soft.  There is no tenderness. Psychiatric: Patient has a  normal mood and affect. behavior is normal. Judgment and thought content normal.  Recent Results (from the past 2160 hour(s))  IGP, Aptima HPV     Status: None   Collection Time: 10/15/17  1:37 PM  Result Value Ref Range   DIAGNOSIS: Comment     Comment: NEGATIVE FOR INTRAEPITHELIAL LESION AND MALIGNANCY.   Specimen adequacy: Comment     Comment: Satisfactory for evaluation. Endocervical and/or squamous metaplastic cells (endocervical component) are present.    Clinician Provided ICD10 Comment     Comment: Z12.4 Z11.51    Performed by: Comment     Comment: Kingsley Callander, Cytotechnologist (ASCP)   PAP Smear Comment .    Note: Comment     Comment: The Pap smear is a screening test designed to aid in the detection of premalignant and malignant conditions of the uterine cervix.  It is not a diagnostic procedure and should not be used as the sole means of detecting cervical cancer.  Both false-positive and false-negative reports do occur.    Test Methodology Comment     Comment: This liquid based ThinPrep(R) pap test was screened with the use of an image guided system.    HPV Aptima Negative Negative    Comment: This test detects fourteen high-risk HPV types (16/18/31/33/35/39/45/ 51/52/56/58/59/66/68) without differentiation.   POCT HgB A1C     Status: Normal   Collection Time: 11/06/17  7:56 AM  Result Value Ref Range   Hemoglobin A1C 5.6       PHQ2/9: Depression screen [facility] 2/9 04/24/2017 05/04/2016 03/22/2016 09/22/2015 05/13/2015  Decreased Interest 0 0 0 0 0  Down, Depressed, Hopeless 0 0 0 0 0  PHQ - 2 Score 0 0 0 0 0     Fall Risk: Fall Risk  11/06/2017 04/24/2017 05/04/2016 03/22/2016 09/22/2015  Falls in the past year? No No No No No     Functional Status Survey: Is the patient deaf or have difficulty hearing?: No Does the patient have difficulty seeing, even when wearing glasses/contacts?: No Does the patient have difficulty concentrating, remembering, or making  decisions?: No Does the patient have difficulty walking or climbing stairs?: No Does the patient have difficulty dressing or bathing?: No Does the patient have difficulty doing errands alone such as visiting a doctor's office or shopping?: No    Assessment & Plan  1. Pre-diabetes  - POCT HgB A1C - Semaglutide (OZEMPIC) 1 MG/DOSE SOPN; Inject 1 mL into the skin once a week.  Dispense: 9 mL; Refill: 1  2. Hyperglycemia  - Semaglutide (OZEMPIC) 1 MG/DOSE SOPN; Inject 1 mL into the skin once a week.  Dispense: 9 mL; Refill: 1  3. Morbid obesity, unspecified obesity type (HCC)  Responding well , lost 31 lbs since started on Ozempic 04/2017 - Semaglutide (OZEMPIC) 1 MG/DOSE SOPN; Inject 1 mL into the skin once a week.  Dispense: 9 mL; Refill: 1  4. Insomnia  Taking Trazodone prn   5. Benign essential HTN  Needs to take HCTZ daily and monitor bp at home   6. Gastro-esophageal reflux disease without esophagitis  Taking medication and is doing well   7. History of bariatric surgery  Doing well on Ozempic   8. Dry cough  - fluticasone furoate-vilanterol (BREO ELLIPTA) 100-25 MCG/INH AEPB; Inhale 1 puff into the lungs daily.  Dispense: 60 each; Refill: 0

## 2017-11-07 IMAGING — MR MR SHOULDER*L* W/O CM
5 series · 40 of 40 positions shown · non-contrast
Comparison: None.

CLINICAL DATA: Left shoulder impingement. Decreased range of
motion.

EXAM:
MRI OF THE LEFT SHOULDER WITHOUT CONTRAST
TECHNIQUE: Multiplanar, multisequence MR imaging of the shoulder was performed.
No intravenous contrast was administered.

[Series 4: T2 fat-sat · coronal · 4.0mm · 0.62mm/px · 8 of 19 slices shown (1 of 3)]
[im 1/19]
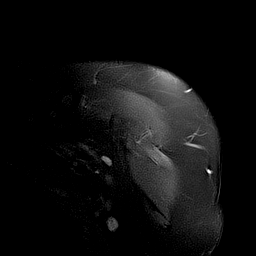
[im 3/19]
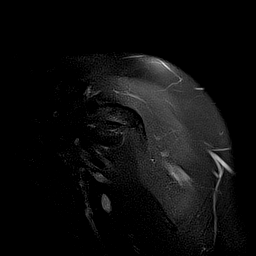
[im 6/19]
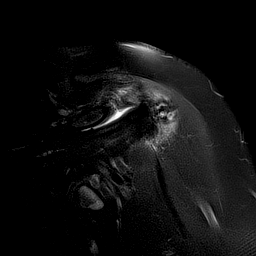
[im 8/19]
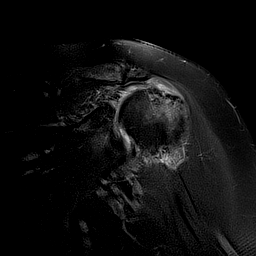
[im 11/19]
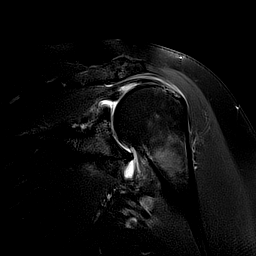
[im 13/19]
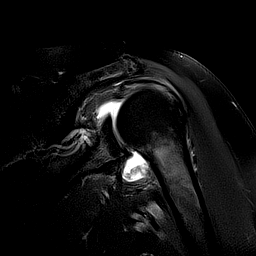
[im 16/19]
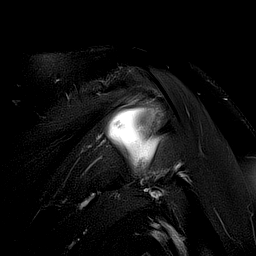
[im 19/19]
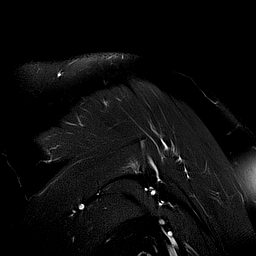

[Series 5: PD · coronal · 4.0mm · 0.62mm/px · 8 of 19 slices shown]
[im 1/19]
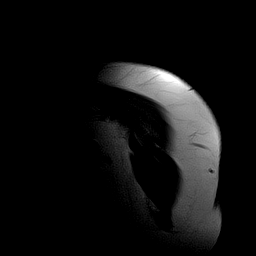
[im 3/19]
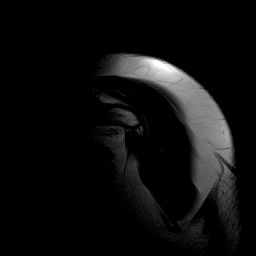
[im 6/19]
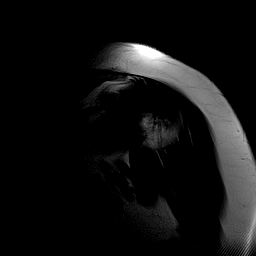
[im 8/19]
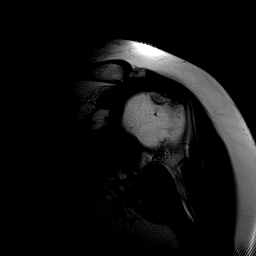
[im 11/19]
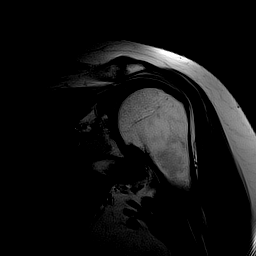
[im 13/19]
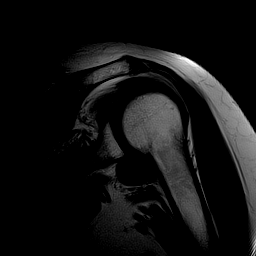
[im 16/19]
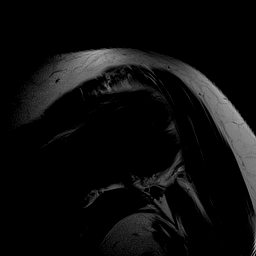
[im 19/19]
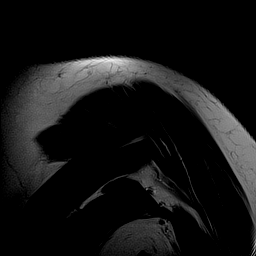

[Series 6: T1 · sagittal · 4.0mm · 0.55mm/px · 8 of 20 slices shown]
[im 1/20]
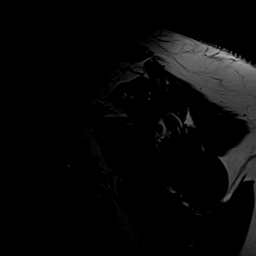
[im 3/20]
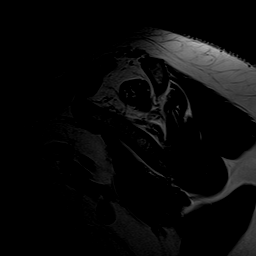
[im 6/20]
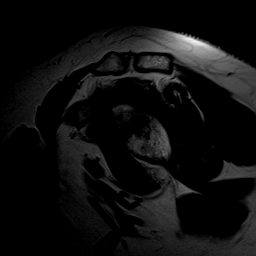
[im 9/20]
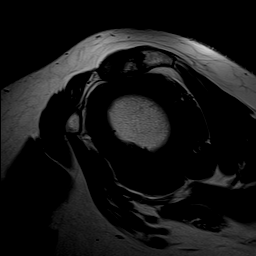
[im 11/20]
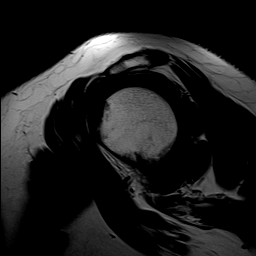
[im 14/20]
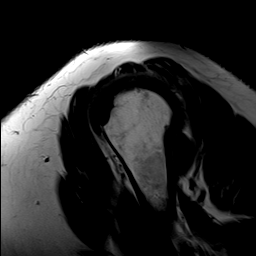
[im 17/20]
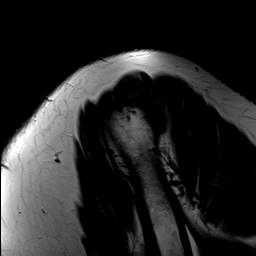
[im 20/20]
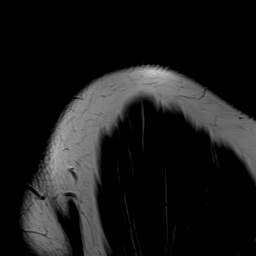

[Series 7: T2 fat-sat · sagittal · 4.0mm · 0.55mm/px · 8 of 20 slices shown (2 of 3)]
[im 1/20]
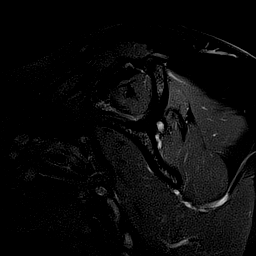
[im 3/20]
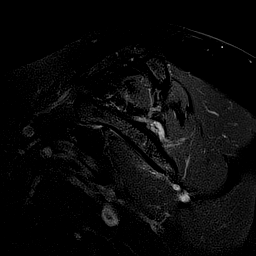
[im 6/20]
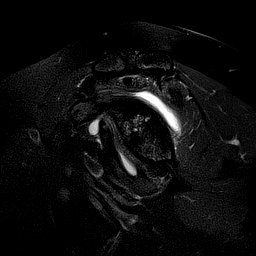
[im 9/20]
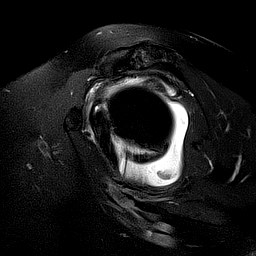
[im 11/20]
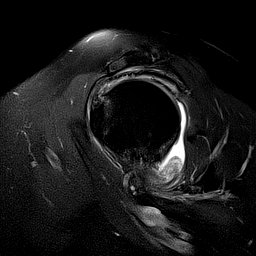
[im 14/20]
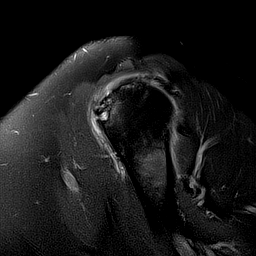
[im 17/20]
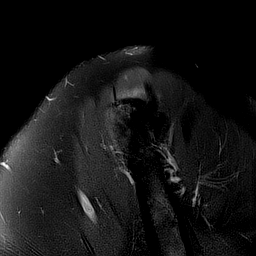
[im 20/20]
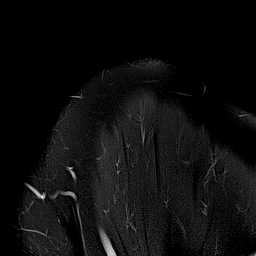

[Series 8: T2 fat-sat · axial · 4.0mm · 0.47mm/px · z∈[-13,+70]mm · 8 of 20 slices shown (3 of 3)]
[im 1/20]
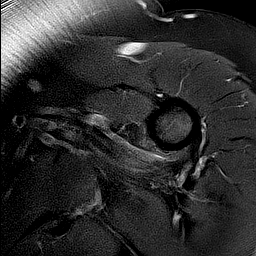
[im 3/20]
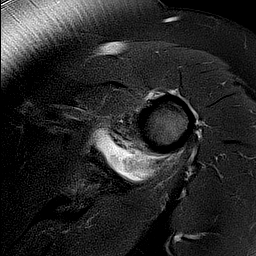
[im 6/20]
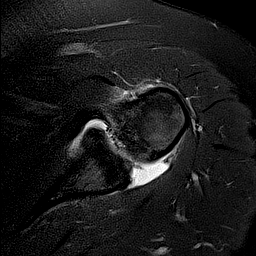
[im 9/20]
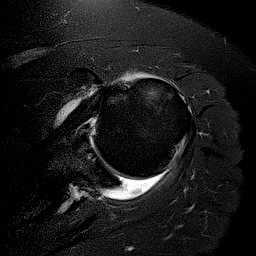
[im 11/20]
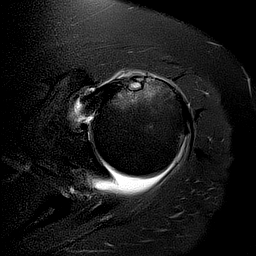
[im 14/20]
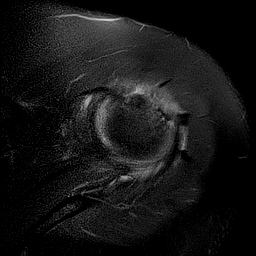
[im 17/20]
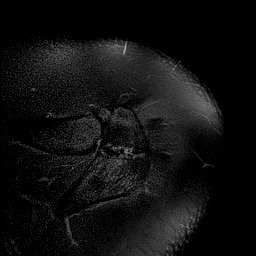
[im 20/20]
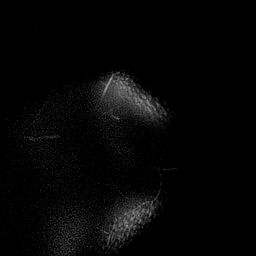

[40 of 40 positions shown; findings below may reference images not displayed]

FINDINGS: Rotator cuff: Severe tendinosis of the anterior half of the
supraspinatus tendon with a large high-grade partial-thickness
bursal surface tear with a possible small full-thickness component
of the anterior most fibers. Partial-thickness articular surface
tear of the posterior fibers of the supraspinatus tendon with
involvement of 50% of the thickness and 10 mm of retraction the
fibers. Moderate tendinosis of the infraspinatus tendon with a small
partial thickness articular surface tear. Teres minor tendon is
intact. Subscapularis tendon is intact.

Muscles: No atrophy or fatty replacement of nor abnormal signal
within, the muscles of the rotator cuff.

Biceps long head: Intraarticular portion the long head of the biceps
tendon is not well-visualized suggesting a full-thickness tear.

Acromioclavicular Joint: Mild degenerative changes of the
acromioclavicular joint. Os acromiale with cystic changes on either
side of the synchondrosis as can be seen with instability. Small
amount of subacromial/subdeltoid bursal fluid.

Glenohumeral Joint: Moderate joint effusion with synovitis. Small
areas high-grade partial-thickness cartilage loss involving the
humeral head. Areas of full-thickness cartilage loss and subchondral
cystic changes involving the glenoid.

Labrum: Grossly intact, but evaluation is limited by lack of
intraarticular fluid.

Bones: Subcortical cystic changes at the supraspinatus insertion. No
fracture or dislocation.

Other: None
IMPRESSION: 1. Severe tendinosis of the anterior half of the supraspinatus
tendon with a large high-grade partial-thickness bursal surface tear
with a possible small full-thickness component of the anterior most
fibers. Partial-thickness articular surface tear of the posterior
fibers of the supraspinatus tendon with involvement of 50% of the
thickness and 10 mm of retraction the fibers.
2. Moderate tendinosis of the infraspinatus tendon with a small
partial thickness articular surface tear.
3. Intraarticular portion the long head of the biceps tendon is not
well-visualized suggesting a full-thickness tear.
4. Moderate osteoarthritis of the left glenohumeral joint. Moderate
joint effusion with synovitis.

## 2018-01-20 ENCOUNTER — Telehealth: Payer: Self-pay | Admitting: Family Medicine

## 2018-01-20 NOTE — Telephone Encounter (Signed)
Copied from CRM [phone]. Topic: Quick Communication - See Telephone Encounter >> Jan 20, 2018 11:18 AM Cipriano BunkerLambe, Annette S wrote: CRM for notification. See Telephone encounter for:   Semaglutide (OZEMPIC) 1 MG/DOSE SOPN  Has question : is there any thing else she can take - very expensive.   Has deductible for this year   01/20/18.

## 2018-01-20 NOTE — Telephone Encounter (Signed)
Trulicity , victoza, but they are all expensive

## 2018-01-22 ENCOUNTER — Other Ambulatory Visit: Payer: Self-pay | Admitting: Family Medicine

## 2018-01-28 ENCOUNTER — Other Ambulatory Visit: Payer: Self-pay | Admitting: Family Medicine

## 2018-01-28 DIAGNOSIS — E785 Hyperlipidemia, unspecified: Secondary | ICD-10-CM

## 2018-01-28 NOTE — Telephone Encounter (Signed)
Tiffany please let patient know, she has a $305 deductible to meet.  Once she meets deductible she will pay less with co-pay - 25 monthly with coupon

## 2018-01-28 NOTE — Telephone Encounter (Signed)
Relation to pt: self Call back number: [phone]  Pharmacy:  Reason for call:  Patient checking on the status of message below, please advise

## 2018-01-28 NOTE — Telephone Encounter (Signed)
Patient is checking on status of message about the Ozempic. Please advise.

## 2018-01-28 NOTE — Telephone Encounter (Signed)
After a $305 annual deductible is met which applies to drugs in tiers 3, 4 and 5 only, the following benefits apply.   Tier Number  Preferred Retail Pharmacy for a 30 day supply  Retail Pharmacy for a 30 day supply  Preferred Mail Order Pharmacy for a 90 day supply  [facility] for a 31 day supply   Tier 1 - Preferred Generic  $4 copayment  $15 copayment  $12 copayment  $15 copayment   Tier 2 -Generic  $8 copayment  $20 copayment  $24 copayment  $20 copayment   Tier 3 - Preferred Brand  $37 copayment  $47 copayment  $111 copayment  $47 copayment   Tier 4 - Non-Preferred Drug  45% Coinsurance  50% Coinsurance  45% Coinsurance  50% Coinsurance   Tier 5*- Specialty  25% coinsurance  25% coinsurance  25% coinsurance  25% coinsurance     VICTOZA - liraglutide soln pen-injector 18 mg/44m (6 mg/ml) 3 QL (1 package/30 days), ST   OZEMPIC - semaglutide soln pen-inj 0.25 or 0.5 mg/dose(2 mg/1.574m 3 QL (1 pen/28 days), ST  OZEMPIC - semaglutide soln pen-inj 1 mg/dose (2 mg/1.46m93m3 QL (2 penFFMB/84ys), ST   Trulicity was not on their list  If your drug is not included in this formulary (list of covered drugs), you should first contact Customer Service and ask if your drug is covered.   If you learn that our plan does not cover your drug, you have two options:   .You can ask Customer Service for a list of similar drugs that are covered by our plan. When youreceive the list, show it to your doctor and ask him or her to prescribe a similar drug that is coveredby our plan.  .YoMar[employer] Kitchen can ask our plan to make an exception and cover your drug. See below for information abouthow to request an exception.

## 2018-01-28 NOTE — Telephone Encounter (Signed)
Refill Request for Cholesterol medication. Simvastatin 20 mg  Last physical: None indicated   Lab Results  Component Value Date   CHOL 209 (H) 05/01/2017   HDL 70 05/01/2017   LDLCALC 118 (H) 05/01/2017   TRIG 105 05/01/2017   CHOLHDL 3.0 05/01/2017    Follow up visit: None indicated

## 2018-01-29 NOTE — Telephone Encounter (Signed)
Metformin 

## 2018-01-29 NOTE — Telephone Encounter (Signed)
Patient wants to pick up a sample but states it is very hard for her to afford the deductible. Is there any other options other than Victoza, Ozempic and Trulicity that are affordable for her? She would like this resolved and has been waiting since 01/20/2018.

## 2018-01-30 NOTE — Telephone Encounter (Signed)
Left a message to see if patient was able to come by to pick up Ozempic and also inform her if she was unable to afford the deductibles for injectionable she could take Metformin.

## 2018-08-14 ENCOUNTER — Other Ambulatory Visit: Payer: Self-pay | Admitting: Family Medicine

## 2018-08-14 DIAGNOSIS — K219 Gastro-esophageal reflux disease without esophagitis: Secondary | ICD-10-CM

## 2018-10-31 ENCOUNTER — Encounter: Payer: Self-pay | Admitting: Advanced Practice Midwife

## 2018-10-31 ENCOUNTER — Ambulatory Visit: Payer: Self-pay | Admitting: *Deleted

## 2018-10-31 ENCOUNTER — Ambulatory Visit (INDEPENDENT_AMBULATORY_CARE_PROVIDER_SITE_OTHER): Payer: BLUE CROSS/BLUE SHIELD | Admitting: Advanced Practice Midwife

## 2018-10-31 VITALS — BP 128/84 | Ht 67.0 in | Wt 246.0 lb

## 2018-10-31 DIAGNOSIS — Z0000 Encounter for general adult medical examination without abnormal findings: Secondary | ICD-10-CM

## 2018-10-31 DIAGNOSIS — Z1239 Encounter for other screening for malignant neoplasm of breast: Secondary | ICD-10-CM

## 2018-10-31 DIAGNOSIS — Z01419 Encounter for gynecological examination (general) (routine) without abnormal findings: Secondary | ICD-10-CM | POA: Diagnosis not present

## 2018-10-31 NOTE — Patient Instructions (Signed)
Health Maintenance, Female Adopting a healthy lifestyle and getting preventive care can go a long way to promote health and wellness. Talk with your health care provider about what schedule of regular examinations is right for you. This is a good chance for you to check in with your provider about disease prevention and staying healthy. In between checkups, there are plenty of things you can do on your own. Experts have done a lot of research about which lifestyle changes and preventive measures are most likely to keep you healthy. Ask your health care provider for more information. Weight and diet Eat a healthy diet  Be sure to include plenty of vegetables, fruits, low-fat dairy products, and lean protein.  Do not eat a lot of foods high in solid fats, added sugars, or salt.  Get regular exercise. This is one of the most important things you can do for your health. ? Most adults should exercise for at least 150 minutes each week. The exercise should increase your heart rate and make you sweat (moderate-intensity exercise). ? Most adults should also do strengthening exercises at least twice a week. This is in addition to the moderate-intensity exercise. Maintain a healthy weight  Body mass index (BMI) is a measurement that can be used to identify possible weight problems. It estimates body fat based on height and weight. Your health care provider can help determine your BMI and help you achieve or maintain a healthy weight.  For females 5 years of age and older: ? A BMI below 18.5 is considered underweight. ? A BMI of 18.5 to 24.9 is normal. ? A BMI of 25 to 29.9 is considered overweight. ? A BMI of 30 and above is considered obese. Watch levels of cholesterol and blood lipids  You should start having your blood tested for lipids and cholesterol at 55 years of age, then have this test every 5 years.  You may need to have your cholesterol levels checked more often if: ? Your lipid or  cholesterol levels are high. ? You are older than 55 years of age. ? You are at high risk for heart disease. Cancer screening Lung Cancer  Lung cancer screening is recommended for adults 48-79 years old who are at high risk for lung cancer because of a history of smoking.  A yearly low-dose CT scan of the lungs is recommended for people who: ? Currently smoke. ? Have quit within the past 15 years. ? Have at least a 30-pack-year history of smoking. A pack year is smoking an average of one pack of cigarettes a day for 1 year.  Yearly screening should continue until it has been 15 years since you quit.  Yearly screening should stop if you develop a health problem that would prevent you from having lung cancer treatment. Breast Cancer  Practice breast self-awareness. This means understanding how your breasts normally appear and feel.  It also means doing regular breast self-exams. Let your health care provider know about any changes, no matter how small.  If you are in your 20s or 30s, you should have a clinical breast exam (CBE) by a health care provider every 1-3 years as part of a regular health exam.  If you are 22 or older, have a CBE every year. Also consider having a breast X-ray (mammogram) every year.  If you have a family history of breast cancer, talk to your health care provider about genetic screening.  If you are at high risk for breast cancer, talk  to your health care provider about having an MRI and a mammogram every year.  Breast cancer gene (BRCA) assessment is recommended for women who have family members with BRCA-related cancers. BRCA-related cancers include: ? Breast. ? Ovarian. ? Tubal. ? Peritoneal cancers.  Results of the assessment will determine the need for genetic counseling and BRCA1 and BRCA2 testing. Cervical Cancer Your health care provider may recommend that you be screened regularly for cancer of the pelvic organs (ovaries, uterus, and vagina).  This screening involves a pelvic examination, including checking for microscopic changes to the surface of your cervix (Pap test). You may be encouraged to have this screening done every 3 years, beginning at age 21.  For women ages 30-65, health care providers may recommend pelvic exams and Pap testing every 3 years, or they may recommend the Pap and pelvic exam, combined with testing for human papilloma virus (HPV), every 5 years. Some types of HPV increase your risk of cervical cancer. Testing for HPV may also be done on women of any age with unclear Pap test results.  Other health care providers may not recommend any screening for nonpregnant women who are considered low risk for pelvic cancer and who do not have symptoms. Ask your health care provider if a screening pelvic exam is right for you.  If you have had past treatment for cervical cancer or a condition that could lead to cancer, you need Pap tests and screening for cancer for at least 20 years after your treatment. If Pap tests have been discontinued, your risk factors (such as having a new sexual partner) need to be reassessed to determine if screening should resume. Some women have medical problems that increase the chance of getting cervical cancer. In these cases, your health care provider may recommend more frequent screening and Pap tests. Colorectal Cancer  This type of cancer can be detected and often prevented.  Routine colorectal cancer screening usually begins at 55 years of age and continues through 55 years of age.  Your health care provider may recommend screening at an earlier age if you have risk factors for colon cancer.  Your health care provider may also recommend using home test kits to check for hidden blood in the stool.  A small camera at the end of a tube can be used to examine your colon directly (sigmoidoscopy or colonoscopy). This is done to check for the earliest forms of colorectal cancer.  Routine  screening usually begins at age 50.  Direct examination of the colon should be repeated every 5-10 years through 55 years of age. However, you may need to be screened more often if early forms of precancerous polyps or small growths are found. Skin Cancer  Check your skin from head to toe regularly.  Tell your health care provider about any new moles or changes in moles, especially if there is a change in a mole's shape or color.  Also tell your health care provider if you have a mole that is larger than the size of a pencil eraser.  Always use sunscreen. Apply sunscreen liberally and repeatedly throughout the day.  Protect yourself by wearing long sleeves, pants, a wide-brimmed hat, and sunglasses whenever you are outside. Heart disease, diabetes, and high blood pressure  High blood pressure causes heart disease and increases the risk of stroke. High blood pressure is more likely to develop in: ? People who have blood pressure in the high end of the normal range (130-139/85-89 mm Hg). ? People   who are overweight or obese. ? People who are African American.  If you are 84-22 years of age, have your blood pressure checked every 3-5 years. If you are 67 years of age or older, have your blood pressure checked every year. You should have your blood pressure measured twice-once when you are at a hospital or clinic, and once when you are not at a hospital or clinic. Record the average of the two measurements. To check your blood pressure when you are not at a hospital or clinic, you can use: ? An automated blood pressure machine at a pharmacy. ? A home blood pressure monitor.  If you are between 52 years and 3 years old, ask your health care provider if you should take aspirin to prevent strokes.  Have regular diabetes screenings. This involves taking a blood sample to check your fasting blood sugar level. ? If you are at a normal weight and have a low risk for diabetes, have this test once  every three years after 55 years of age. ? If you are overweight and have a high risk for diabetes, consider being tested at a younger age or more often. Preventing infection Hepatitis B  If you have a higher risk for hepatitis B, you should be screened for this virus. You are considered at high risk for hepatitis B if: ? You were born in a country where hepatitis B is common. Ask your health care provider which countries are considered high risk. ? Your parents were born in a high-risk country, and you have not been immunized against hepatitis B (hepatitis B vaccine). ? You have HIV or AIDS. ? You use needles to inject street drugs. ? You live with someone who has hepatitis B. ? You have had sex with someone who has hepatitis B. ? You get hemodialysis treatment. ? You take certain medicines for conditions, including cancer, organ transplantation, and autoimmune conditions. Hepatitis C  Blood testing is recommended for: ? Everyone born from [birth year] through 1963. ? Anyone with known risk factors for hepatitis C. Sexually transmitted infections (STIs)  You should be screened for sexually transmitted infections (STIs) including gonorrhea and chlamydia if: ? You are sexually active and are younger than 55 years of age. ? You are older than 55 years of age and your health care provider tells you that you are at risk for this type of infection. ? Your sexual activity has changed since you were last screened and you are at an increased risk for chlamydia or gonorrhea. Ask your health care provider if you are at risk.  If you do not have HIV, but are at risk, it may be recommended that you take a prescription medicine daily to prevent HIV infection. This is called pre-exposure prophylaxis (PrEP). You are considered at risk if: ? You are sexually active and do not regularly use condoms or know the HIV status of your partner(s). ? You take drugs by injection. ? You are sexually active with a partner  who has HIV. Talk with your health care provider about whether you are at high risk of being infected with HIV. If you choose to begin PrEP, you should first be tested for HIV. You should then be tested every 3 months for as long as you are taking PrEP. Pregnancy  If you are premenopausal and you may become pregnant, ask your health care provider about preconception counseling.  If you may become pregnant, take 400 to 800 micrograms (mcg) of folic acid every  day.  If you want to prevent pregnancy, talk to your health care provider about birth control (contraception). Osteoporosis and menopause  Osteoporosis is a disease in which the bones lose minerals and strength with aging. This can result in serious bone fractures. Your risk for osteoporosis can be identified using a bone density scan.  If you are 60 years of age or older, or if you are at risk for osteoporosis and fractures, ask your health care provider if you should be screened.  Ask your health care provider whether you should take a calcium or vitamin D supplement to lower your risk for osteoporosis.  Menopause may have certain physical symptoms and risks.  Hormone replacement therapy may reduce some of these symptoms and risks. Talk to your health care provider about whether hormone replacement therapy is right for you. Follow these instructions at home:  Schedule regular health, dental, and eye exams.  Stay current with your immunizations.  Do not use any tobacco products including cigarettes, chewing tobacco, or electronic cigarettes.  If you are pregnant, do not drink alcohol.  If you are breastfeeding, limit how much and how often you drink alcohol.  Limit alcohol intake to no more than 1 drink per day for nonpregnant women. One drink equals 12 ounces of beer, 5 ounces of wine, or 1 ounces of hard liquor.  Do not use street drugs.  Do not share needles.  Ask your health care provider for help if you need support  or information about quitting drugs.  Tell your health care provider if you often feel depressed.  Tell your health care provider if you have ever been abused or do not feel safe at home. This information is not intended to replace advice given to you by your health care provider. Make sure you discuss any questions you have with your health care provider. Document Released: 05/07/2011 Document Revised: 03/29/2016 Document Reviewed: 07/26/2015 Elsevier Interactive Patient Education  2019 [employer].     Why follow it? Research shows. . Those who follow the Mediterranean diet have a reduced risk of heart disease  . The diet is associated with a reduced incidence of Parkinson's and Alzheimer's diseases . People following the diet may have longer life expectancies and lower rates of chronic diseases  . The Dietary Guidelines for Americans recommends the Mediterranean diet as an eating plan to promote health and prevent disease  What Is the Mediterranean Diet?  . Healthy eating plan based on typical foods and recipes of Mediterranean-style cooking . The diet is primarily a plant based diet; these foods should make up a majority of meals   Starches - Plant based foods should make up a majority of meals - They are an important sources of vitamins, minerals, energy, antioxidants, and fiber - Choose whole grains, foods high in fiber and minimally processed items  - Typical grain sources include wheat, oats, barley, corn, brown rice, bulgar, farro, millet, polenta, couscous  - Various types of beans include chickpeas, lentils, fava beans, black beans, white beans   Fruits  Veggies - Large quantities of antioxidant rich fruits & veggies; 6 or more servings  - Vegetables can be eaten raw or lightly drizzled with oil and cooked  - Vegetables common to the traditional Mediterranean Diet include: artichokes, arugula, beets, broccoli, brussel sprouts, cabbage, carrots, celery, collard greens,  cucumbers, eggplant, kale, leeks, lemons, lettuce, mushrooms, okra, onions, peas, peppers, potatoes, pumpkin, radishes, rutabaga, shallots, spinach, sweet potatoes, turnips, zucchini - Fruits common to the  Mediterranean Diet include: apples, apricots, avocados, cherries, clementines, dates, figs, grapefruits, grapes, melons, nectarines, oranges, peaches, pears, pomegranates, strawberries, tangerines  Fats - Replace butter and margarine with healthy oils, such as olive oil, canola oil, and tahini  - Limit nuts to no more than a handful a day  - Nuts include walnuts, almonds, pecans, pistachios, pine nuts  - Limit or avoid candied, honey roasted or heavily salted nuts - Olives are central to the [employer] - can be eaten whole or used in a variety of dishes   Meats Protein - Limiting red meat: no more than a few times a month - When eating red meat: choose lean cuts and keep the portion to the size of deck of cards - Eggs: approx. 0 to 4 times a week  - Fish and lean poultry: at least 2 a week  - Healthy protein sources include, chicken, [place], lean beef, lamb - Increase intake of seafood such as tuna, salmon, trout, mackerel, shrimp, scallops - Avoid or limit high fat processed meats such as sausage and bacon  Dairy - Include moderate amounts of low fat dairy products  - Focus on healthy dairy such as fat free yogurt, skim milk, low or reduced fat cheese - Limit dairy products higher in fat such as whole or 2% milk, cheese, ice cream  Alcohol - Moderate amounts of red wine is ok  - No more than 5 oz daily for women (all ages) and men older than age 14  - No more than 10 oz of wine daily for men younger than 30  Other - Limit sweets and other desserts  - Use herbs and spices instead of salt to flavor foods  - Herbs and spices common to the traditional Mediterranean Diet include: basil, bay leaves, chives, cloves, cumin, fennel, garlic, lavender, marjoram, mint, oregano, parsley, pepper,  rosemary, sage, savory, sumac, tarragon, thyme   It's not just a diet, it's a lifestyle:  . The Mediterranean diet includes lifestyle factors typical of those in the region  . Foods, drinks and meals are best eaten with others and savored . Daily physical activity is important for overall good health . This could be strenuous exercise like running and aerobics . This could also be more leisurely activities such as walking, housework, yard-work, or taking the stairs . Moderation is the key; a balanced and healthy diet accommodates most foods and drinks . Consider portion sizes and frequency of consumption of certain foods   Meal Ideas & Options:  . Breakfast:  o Whole wheat toast or whole wheat English muffins with peanut butter & hard boiled egg o Steel cut oats topped with apples & cinnamon and skim milk  o Fresh fruit: banana, strawberries, melon, berries, peaches  o Smoothies: strawberries, bananas, greek yogurt, peanut butter o Low fat greek yogurt with blueberries and granola  o Egg white omelet with spinach and mushrooms o Breakfast couscous: whole wheat couscous, apricots, skim milk, cranberries  . Sandwiches:  o Hummus and grilled vegetables (peppers, zucchini, squash) on whole wheat bread   o Grilled chicken on whole wheat pita with lettuce, tomatoes, cucumbers or tzatziki  o Tuna salad on whole wheat bread: tuna salad made with greek yogurt, olives, red peppers, capers, green onions o Garlic rosemary lamb pita: lamb sauted with garlic, rosemary, salt & pepper; add lettuce, cucumber, greek yogurt to pita - flavor with lemon juice and black pepper  . Seafood:  o Mediterranean grilled salmon, seasoned with garlic, basil,  parsley, lemon juice and black pepper o Shrimp, lemon, and spinach whole-grain pasta salad made with low fat greek yogurt  o Seared scallops with lemon orzo  o Seared tuna steaks seasoned salt, pepper, coriander topped with tomato mixture of olives, tomatoes,  olive oil, minced garlic, parsley, green onions and cappers  . Meats:  o Herbed greek chicken salad with kalamata olives, cucumber, feta  o Red bell peppers stuffed with spinach, bulgur, lean ground beef (or lentils) & topped with feta   o Kebabs: skewers of chicken, tomatoes, onions, zucchini, squash  o [place] burgers: made with red onions, mint, dill, lemon juice, feta cheese topped with roasted red peppers . Vegetarian o Cucumber salad: cucumbers, artichoke hearts, celery, red onion, feta cheese, tossed in olive oil & lemon juice  o Hummus and whole grain pita points with a greek salad (lettuce, tomato, feta, olives, cucumbers, red onion) o Lentil soup with celery, carrots made with vegetable broth, garlic, salt and pepper  o Tabouli salad: parsley, bulgur, mint, scallions, cucumbers, tomato, radishes, lemon juice, olive oil, salt and pepper.      American Heart Association (AHA) Exercise Recommendation  Being physically active is important to prevent heart disease and stroke, the nation's No. 1and No. 5killers. To improve overall cardiovascular health, we suggest at least 150 minutes per week of moderate exercise or 75 minutes per week of vigorous exercise (or a combination of moderate and vigorous activity). Thirty minutes a day, five times a week is an easy goal to remember. You will also experience benefits even if you divide your time into two or three segments of 10 to 15 minutes per day.  For people who would benefit from lowering their blood pressure or cholesterol, we recommend 40 minutes of aerobic exercise of moderate to vigorous intensity three to four times a week to lower the risk for heart attack and stroke.  Physical activity is anything that makes you move your body and burn calories.  This includes things like climbing stairs or playing sports. Aerobic exercises benefit your heart, and include walking, jogging, swimming or biking. Strength and stretching exercises are best  for overall stamina and flexibility.  The simplest, positive change you can make to effectively improve your heart health is to start walking. It's enjoyable, free, easy, social and great exercise. A walking program is flexible and boasts high success rates because people can stick with it. It's easy for walking to become a regular and satisfying part of life.   For Overall Cardiovascular Health:  At least 30 minutes of moderate-intensity aerobic activity at least 5 days per week for a total of 150  OR   At least 25 minutes of vigorous aerobic activity at least 3 days per week for a total of 75 minutes; or a combination of moderate- and vigorous-intensity aerobic activity  AND   Moderate- to high-intensity muscle-strengthening activity at least 2 days per week for additional health benefits.  For Lowering Blood Pressure and Cholesterol  An average 40 minutes of moderate- to vigorous-intensity aerobic activity 3 or 4 times per week  What if I can't make it to the time goal? Something is always better than nothing! And everyone has to start somewhere. Even if you've been sedentary for years, today is the day you can begin to make healthy changes in your life. If you don't think you'll make it for 30 or 40 minutes, set a reachable goal for today. You can work up toward your overall goal by  increasing your time as you get stronger. Don't let all-or-nothing thinking rob you of doing what you can every day.  Source:http://www.heart.org

## 2018-10-31 NOTE — Telephone Encounter (Signed)
Patient was working and had dizzy spell- she had to lay down. Had hot spell and she is shaky. Patient has had to stop Ozempic due to insurance- she was too embarrassed to tell anyone.Call to office- appointment has been scheduled for Monday- patient instructed if she has any changes- she is to go to ED- she voices understanding.  Reason for Disposition . [1] MODERATE dizziness (e.g., interferes with normal activities) AND [2] has NOT been evaluated by physician for this  (Exception: dizziness caused by heat exposure, sudden standing, or poor fluid intake)  Answer Assessment - Initial Assessment Questions 1. DESCRIPTION: "Describe your dizziness."     Patient is feeling better now- before she had hot flash with sweating, she felt "heavy to walk" 2. LIGHTHEADED: "Do you feel lightheaded?" (e.g., somewhat faint, woozy, weak upon standing)     Weak upon standing, woozy 3. VERTIGO: "Do you feel like either you or the room is spinning or tilting?" (i.e. vertigo)     no 4. SEVERITY: "How bad is it?"  "Do you feel like you are going to faint?" "Can you stand and walk?"   - MILD - walking normally   - MODERATE - interferes with normal activities (e.g., work, school)    - SEVERE - unable to stand, requires support to walk, feels like passing out now.      Easing off now- weakness is easing- during the episode it was severe 5. ONSET:  "When did the dizziness begin?"     2:15 today- patient reports it has happened before- month ago 6. AGGRAVATING FACTORS: "Does anything make it worse?" (e.g., standing, change in head position)     Moving made it worse 7. HEART RATE: "Can you tell me your heart rate?" "How many beats in 15 seconds?"  (Note: not all patients can do this)       Heart rate felt hard and like it was pumping out of chest- faster than normal- patient states she has returned to normal now 8. CAUSE: "What do you think is causing the dizziness?"     Glucose level dropping- hernia 9. RECURRENT  SYMPTOM: "Have you had dizziness before?" If so, ask: "When was the last time?" "What happened that time?"     A month ago- was not this bad 10. OTHER SYMPTOMS: "Do you have any other symptoms?" (e.g., fever, chest pain, vomiting, diarrhea, bleeding)       no 11. PREGNANCY: "Is there any chance you are pregnant?" "When was your last menstrual period?"       n/a  Protocols used: DIZZINESS [facility]- LIGHTHEADEDNESS-A-AH

## 2018-10-31 NOTE — Progress Notes (Signed)
Patient ID: Savannah Manning, female   DOB: 19-Jul-1961, 57 y.o.   MRN: [id number]      Gynecology Annual Exam  PCP: Savannah CorySowles, Krichna, MD  Chief Complaint:  Chief Complaint  Patient presents with  . Annual Exam    History of Present Illness:Patient is a 57 y.o. G2P0011 presents for annual exam. The patient has no gyn complaints today. She has an umbilical hernia that occasionally causes pain. She is working with her PCP to coordinate possible surgery.  LMP: No LMP recorded. Patient has had a hysterectomy.  Postcoital Bleeding: not applicable Dysmenorrhea: not applicable  The patient is not currently sexually active. She denies dyspareunia.  The patient does perform self breast exams.  There is no notable family history of breast or ovarian cancer in her family.  The patient wears seatbelts: yes.   The patient has regular exercise: she denies regular exercise or healthy diet. She denies adequate sleep. She is making some effort to drink more water. She states her job and her 55 year old son keep her very busy and it is difficult to focus on herself. She is helping her son with his college/football plans currently and plans to increase self care once he goes to college. Discussed making small lifestyle changes as a strategy for lasting change. She does have a nutritionist at work that she can talk with regarding healthy diet. .    The patient denies current symptoms of depression.     Review of Systems: Review of Systems  Constitutional: Negative.   HENT: Negative.   Eyes: Negative.   Respiratory: Negative.   Cardiovascular: Negative.   Gastrointestinal:       Umbilical hernia pain  Genitourinary: Negative.   Musculoskeletal: Negative.   Skin: Negative.   Neurological: Negative.   Endo/Heme/Allergies: Negative.   Psychiatric/Behavioral: Negative.     Past Medical History:  Past Medical History:  Diagnosis Date  . Anemia   . Arthritis   . Asthma   . GERD (gastroesophageal  reflux disease)   . Headache   . Hypertension     Past Surgical History:  Past Surgical History:  Procedure Laterality Date  . ABDOMINAL HYSTERECTOMY  2012  . CHOLECYSTECTOMY    . DILATION AND CURETTAGE OF UTERUS    . GASTRIC BYPASS  2005  . INDUCED ABORTION N/A 1997   patient was about 22 weeks  . SHOULDER ARTHROSCOPY WITH OPEN ROTATOR CUFF REPAIR Left 06/14/2016   Procedure: SHOULDER ARTHROSCOPY WITH OPEN ROTATOR CUFF REPAIR, DECOMPRESSION, EXCISION LOOSE BODY, DEBRIDEMENT;  Surgeon: Christena FlakeJohn J Poggi, MD;  Location: [facility] ORS;  Service: Orthopedics;  Laterality: Left;    Gynecologic History:  No LMP recorded. Patient has had a hysterectomy. Last Pap: 1 year ago Results were:  no abnormalities  Last mammogram: 1 year ago Results were: BI-RAD II probably benign  Obstetric History: 912P0011  Family History:  Family History  Problem Relation Age of Onset  . Hypertension Mother   . Cancer Father        prostate  . Heart disease Brother   . Breast cancer Cousin 40  . Breast cancer Paternal Aunt 750    Social History:  Social History   Socioeconomic History  . Marital status: Single    Spouse name: Not on file  . Number of children: 1  . Years of education: Not on file  . Highest education level: Not on file  Occupational History  . Not on file  Social Needs  . Financial  resource strain: Not on file  . Food insecurity:    Worry: Not on file    Inability: Not on file  . Transportation needs:    Medical: Not on file    Non-medical: Not on file  Tobacco Use  . Smoking status: Never Smoker  . Smokeless tobacco: Never Used  Substance and Sexual Activity  . Alcohol use: No    Alcohol/week: 0.0 standard drinks  . Drug use: No  . Sexual activity: Not Currently  Lifestyle  . Physical activity:    Days per week: Not on file    Minutes per session: Not on file  . Stress: Not on file  Relationships  . Social connections:    Talks on phone: Not on file    Gets together:  Not on file    Attends religious service: Not on file    Active member of club or organization: Not on file    Attends meetings of clubs or organizations: Not on file    Relationship status: Not on file  . Intimate partner violence:    Fear of current or ex partner: Not on file    Emotionally abused: Not on file    Physically abused: Not on file    Forced sexual activity: Not on file  Other Topics Concern  . Not on file  Social History Narrative  . Not on file    Allergies:  Allergies  Allergen Reactions  . Lisinopril Swelling    Facial Swelling  . Ace Inhibitors     angioedema  . Codeine Swelling  . Valsartan Swelling    Medications: Prior to Admission medications   Medication Sig Start Date End Date Taking? Authorizing Provider  Butalbital-APAP-Caffeine 50-300-40 MG CAPS Take 1 capsule by mouth daily as needed (migraine).  05/28/14  Yes [provider]  fluticasone (FLONASE) 50 MCG/ACT nasal spray Place 2 sprays into both nostrils daily. 09/22/15  Yes Brayton ElShah, Syed Asad A, MD  fluticasone furoate-vilanterol (BREO ELLIPTA) 100-25 MCG/INH AEPB Inhale 1 puff into the lungs daily. 11/06/17  Yes Manning, Savannah HeftyKrichna, MD  hydrochlorothiazide (HYDRODIURIL) 25 MG tablet Take 1 tablet (25 mg total) by mouth daily. 11/06/17  Yes Manning, Savannah HeftyKrichna, MD  Loratadine 10 MG CAPS Take 1 capsule by mouth as needed.   Yes [provider]  omeprazole (PRILOSEC) 40 MG capsule TAKE 1 CAPSULE(40 MG) BY MOUTH DAILY 10/20/17  Yes Manning, Savannah HeftyKrichna, MD  Semaglutide (OZEMPIC) 1 MG/DOSE SOPN Inject 1 mL into the skin once a week. 11/06/17  Yes Manning, Savannah HeftyKrichna, MD  simvastatin (ZOCOR) 20 MG tablet TAKE 1 TABLET(20 MG) BY MOUTH DAILY AT 6 PM 01/29/18  Yes Manning, Savannah HeftyKrichna, MD  traZODone (DESYREL) 50 MG tablet Take 1 tablet (50 mg total) by mouth at bedtime. 05/13/15  Yes Savannah CorySowles, Krichna, MD    Physical Exam Vitals: Blood pressure 128/84, height 5\' 7"  (1.702 m), weight 246 lb (111.6 kg).  General:  NAD HEENT: normocephalic, anicteric Thyroid: no enlargement, no palpable nodules Pulmonary: No increased work of breathing, CTAB Cardiovascular: RRR, distal pulses 2+ Breast: Breast symmetrical, no tenderness, no palpable nodules or masses, no skin or nipple retraction present, no nipple discharge.  No axillary or supraclavicular lymphadenopathy. Abdomen: NABS, soft, non-tender, non-distended.  Umbilicus without lesions.  No hepatomegaly, splenomegaly or masses palpable. No evidence of hernia  Genitourinary: deferred for no concerns/PAP interval/shared decision making Extremities: no edema, erythema, or tenderness Neurologic: Grossly intact Psychiatric: mood appropriate, affect full     Assessment: 57 y.o. [id number]  routine annual exam  Plan: Problem List Items Addressed This Visit    None    Visit Diagnoses    Well woman exam without gynecological exam    -  Primary   Breast cancer screening       Relevant Orders   MM DIGITAL SCREENING BILATERAL      1) Mammogram - recommend yearly screening mammogram.  Mammogram Was ordered today  2) STI screening  was offered and declined  3) ASCCP guidelines and rationale discussed.  Patient opts for every 5 years screening interval  4) Osteoporosis  - per USPTF routine screening DEXA at age 70   Consider FDA-approved medical therapies in postmenopausal women and men aged 51 years and older, based on the following: a) A hip or vertebral (clinical or morphometric) fracture b) T-score ? -2.5 at the femoral neck or spine after appropriate evaluation to exclude secondary causes C) Low bone mass (T-score between -1.0 and -2.5 at the femoral neck or spine) and a 10-year probability of a hip fracture ? 3% or a 10-year probability of a major osteoporosis-related fracture ? 20% based on the [place]-adapted WHO algorithm   5) Routine healthcare maintenance including cholesterol, diabetes screening discussed managed by PCP  6) Colonoscopy: Up to date.   Screening recommended starting at age 35 for average risk individuals, age 79 for individuals deemed at increased risk (including African Americans) and recommended to continue until age 39.  For patient age 80-85 individualized approach is recommended.  Gold standard screening is via colonoscopy, Cologuard screening is an acceptable alternative for patient unwilling or unable to undergo colonoscopy.  "Colorectal cancer screening for average?risk adults: 2018 guideline update from the American Cancer Society"CA: A Cancer Journal for Clinicians: Apr 03, 2017   7) Return in 1 year (on 11/01/2019).    Tresea Mall, CNM [facility], [facility] 10/31/2018, 9:04 AM

## 2018-11-03 ENCOUNTER — Ambulatory Visit (INDEPENDENT_AMBULATORY_CARE_PROVIDER_SITE_OTHER): Payer: BLUE CROSS/BLUE SHIELD | Admitting: Family Medicine

## 2018-11-03 ENCOUNTER — Ambulatory Visit
Admission: RE | Admit: 2018-11-03 | Discharge: 2018-11-03 | Disposition: A | Discharge status: Home / Self Care | Payer: BLUE CROSS/BLUE SHIELD | Source: Ambulatory Visit | Attending: Advanced Practice Midwife | Admitting: Advanced Practice Midwife

## 2018-11-03 ENCOUNTER — Other Ambulatory Visit: Payer: Self-pay | Admitting: Advanced Practice Midwife

## 2018-11-03 ENCOUNTER — Encounter: Payer: Self-pay | Admitting: Family Medicine

## 2018-11-03 VITALS — BP 116/72 | HR 59 | Temp 98.2°F | Ht 67.0 in | Wt 241.8 lb

## 2018-11-03 DIAGNOSIS — Z1231 Encounter for screening mammogram for malignant neoplasm of breast: Secondary | ICD-10-CM | POA: Diagnosis not present

## 2018-11-03 DIAGNOSIS — E559 Vitamin D deficiency, unspecified: Secondary | ICD-10-CM | POA: Diagnosis not present

## 2018-11-03 DIAGNOSIS — R42 Dizziness and giddiness: Secondary | ICD-10-CM | POA: Diagnosis not present

## 2018-11-03 DIAGNOSIS — E538 Deficiency of other specified B group vitamins: Secondary | ICD-10-CM | POA: Diagnosis not present

## 2018-11-03 DIAGNOSIS — D509 Iron deficiency anemia, unspecified: Secondary | ICD-10-CM | POA: Diagnosis not present

## 2018-11-03 DIAGNOSIS — R0982 Postnasal drip: Secondary | ICD-10-CM

## 2018-11-03 DIAGNOSIS — E785 Hyperlipidemia, unspecified: Secondary | ICD-10-CM

## 2018-11-03 DIAGNOSIS — K219 Gastro-esophageal reflux disease without esophagitis: Secondary | ICD-10-CM

## 2018-11-03 DIAGNOSIS — K429 Umbilical hernia without obstruction or gangrene: Secondary | ICD-10-CM

## 2018-11-03 MED ORDER — SIMVASTATIN 20 MG PO TABS: ORAL_TABLET | ORAL | 1 refills | Status: DC

## 2018-11-03 MED ORDER — FLUTICASONE PROPIONATE 50 MCG/ACT NA SUSP: 2.0000 | Freq: Every day | NASAL | 0 refills | Status: DC

## 2018-11-03 MED ORDER — HYDROCHLOROTHIAZIDE 25 MG PO TABS: 25.0000 mg | ORAL_TABLET | Freq: Every day | ORAL | 1 refills | Status: DC

## 2018-11-03 MED ORDER — OMEPRAZOLE 40 MG PO CPDR: DELAYED_RELEASE_CAPSULE | ORAL | 1 refills | Status: DC

## 2018-11-03 NOTE — Patient Instructions (Signed)
Near-Syncope °Near-syncope is when you suddenly get weak or dizzy, or you feel like you might pass out (faint). This is due to a lack of blood flow to the brain. During an episode of near-syncope, you may: °· Feel dizzy or light-headed. °· Feel sick to your stomach (nauseous). °· See all white or all black. °· Have cold, clammy skin. °This condition is caused by a sudden decrease in blood flow to the brain. This decrease can result from various causes, but most of those causes are not dangerous. However, near-syncope may be a sign of a serious medical problem, so it is important to seek medical care. °Follow these instructions at home: °Pay attention to any changes in your symptoms. Take these actions to help with your condition: °· Have someone stay with you until you feel stable. °· Talk with your doctor about your symptoms. You may need to have testing to understand the cause of your near-syncope. °· Do not drive, use machinery, or play sports until your doctor says it is okay. °· Keep all follow-up visits as told by your doctor. This is important. °· If you start to feel like you might pass out, lie down right away and raise (elevate) your feet above the level of your heart. Breathe deeply and steadily. Wait until all of the symptoms are gone. °· Drink enough fluid to keep your pee (urine) pale yellow. °Medicines °· If you are taking blood pressure or heart medicine, get up slowly and spend many minutes getting ready to sit and then stand. This can help with dizziness. °· Take over-the-counter and prescription medicines only as told by your doctor. °Get help right away if you: °· Have a seizure. °· Have pain in your: °? Chest. °? Tummy, °? Back. °· Faint. °· Have a bad headache. °· Are bleeding from your mouth or butt. °· Have black or tarry poop (stool). °· Have a very fast or uneven heartbeat (palpitations). °· Are confused. °· Have trouble walking. °· Are very weak. °· Have trouble seeing. °These symptoms may  represent a serious problem that is an emergency. Do not wait to see if your symptoms will go away. Get medical help right away. Call your local emergency services (911 in the [place] Do not drive yourself to the hospital. °Summary °· Near-syncope is when you suddenly get weak or dizzy, or you feel like you might pass out. °· This condition is caused by a lack of blood flow to the brain. °· Near-syncope may be a sign of a serious medical problem, so it is important to seek medical care. °This information is not intended to replace advice given to you by your health care provider. Make sure you discuss any questions you have with your health care provider. °Document Released: 04/09/2008 Document Revised: 06/17/2018 Document Reviewed: 07/06/2015 °Elsevier Interactive Patient Education © 2019 Elsevier Inc. ° °

## 2018-11-03 NOTE — Progress Notes (Signed)
Name: Savannah Manning   MRN: [id number]    DOB: [birth year]   Date:11/03/2018       Progress Note  Subjective  Chief Complaint  Chief Complaint  Patient presents with  . Dizziness    States that it started Christmas day and is usually associated when she has hernia pain.    HPI  Pt presents with concern for lightheadedness that started 3 days ago, umbilical hernia pain started about 5 days ago.  She has had dizziness in the past which had been associated with anemia.  She does note that they dizziness this time seems to be more related to her hernia pain - her hernia pain has worsened over the last 2 years (it has been present for about 17 years). She is not financially able to afford umbilical hernia repair surgery.  She notes an episode of dizziness/feeling hot that was more severe on Friday (3 days ago), but is having some intermittent lightheadedness since then. Denies room-spinning dizziness, chest pain or shortness of breath, no signs or symptoms of bleeding, last colonoscopy was 2015, no fevers/chills, no URI symptoms, headaches, confusion, limb weakness, slurred speech, or facial droop.   She is in need of some medication refills today to get her through to her.  She has a history of bradycardia - was seeing cardiology due to palpitations and bradycardia - was told her heart was healthy, just has a low heart rate.   Patient Active Problem List   Diagnosis Date Noted  . Chronic pain of right knee 07/26/2017  . Vitamin D deficiency 04/24/2017  . B12 deficiency 04/24/2017  . Abnormal mammogram of right breast 01/01/2017  . Asthma, mild persistent 12/17/2016  . History of shoulder surgery 10/12/2016  . Primary osteoarthritis of left shoulder 06/14/2016  . Incomplete tear of left rotator cuff 05/30/2016  . Rotator cuff tendinitis 05/30/2016  . History of bariatric surgery 05/04/2016  . Allergic rhinitis 05/12/2015  . Benign essential HTN 05/12/2015  . Anxiety and depression  05/12/2015  . Insomnia, persistent 05/12/2015  . Dyslipidemia 05/12/2015  . Edema extremities 05/12/2015  . Gastro-esophageal reflux disease without esophagitis 05/12/2015  . Hypoglycemia 05/12/2015  . Eczema intertrigo 05/12/2015  . Anemia, iron deficiency 05/12/2015  . Migraine 05/12/2015  . Plantar fasciitis 05/12/2015  . Umbilical hernia without obstruction and without gangrene 05/12/2015  . Extreme obesity 02/13/2008    Social History   Tobacco Use  . Smoking status: Never Smoker  . Smokeless tobacco: Never Used  Substance Use Topics  . Alcohol use: No    Alcohol/week: 0.0 standard drinks     Current Outpatient Medications:  .  Butalbital-APAP-Caffeine 50-300-40 MG CAPS, Take 1 capsule by mouth daily as needed (migraine). , Disp: , Rfl:  .  fluticasone (FLONASE) 50 MCG/ACT nasal spray, Place 2 sprays into both nostrils daily., Disp: 16 g, Rfl: 0 .  fluticasone furoate-vilanterol (BREO ELLIPTA) 100-25 MCG/INH AEPB, Inhale 1 puff into the lungs daily. (Patient taking differently: Inhale 1 puff into the lungs as needed. ), Disp: 60 each, Rfl: 0 .  hydrochlorothiazide (HYDRODIURIL) 25 MG tablet, Take 1 tablet (25 mg total) by mouth daily., Disp: 30 tablet, Rfl: 1 .  Loratadine 10 MG CAPS, Take 1 capsule by mouth as needed., Disp: , Rfl:  .  omeprazole (PRILOSEC) 40 MG capsule, TAKE 1 CAPSULE(40 MG) BY MOUTH DAILY, Disp: 30 capsule, Rfl: 1 .  simvastatin (ZOCOR) 20 MG tablet, TAKE 1 TABLET(20 MG) BY MOUTH DAILY AT 6 PM,  Disp: 30 tablet, Rfl: 1 .  traZODone (DESYREL) 50 MG tablet, Take 1 tablet (50 mg total) by mouth at bedtime. (Patient taking differently: Take 50 mg by mouth as needed. ), Disp: 90 tablet, Rfl: 1 .  Semaglutide (OZEMPIC) 1 MG/DOSE SOPN, Inject 1 mL into the skin once a week. (Patient not taking: Reported on 11/03/2018), Disp: 9 mL, Rfl: 1  Allergies  Allergen Reactions  . Lisinopril Swelling    Facial Swelling  . Ace Inhibitors     angioedema  . Codeine  Swelling  . Valsartan Swelling    I personally reviewed active problem list, medication list, allergies, notes from last encounter, lab results with the patient/caregiver today.  ROS  Constitutional: Negative for fever or weight change.  Respiratory: Negative for cough and shortness of breath.   Cardiovascular: Negative for chest pain or palpitations.  Gastrointestinal: Negative for abdominal pain, no bowel changes.  Musculoskeletal: Negative for gait problem or joint swelling.  Skin: Negative for rash.  Neurological: Negative for headaches, confusion, limb weakness, slurred speech, or facial droop.  No other specific complaints in a complete review of systems (except as listed in HPI above).  Objective  Vitals:   11/03/18 0810 11/03/18 0811 11/03/18 0812 11/03/18 0830  BP: 120/90 140/90 (!) 150/100 116/72  Pulse: (!) 50 60 (!) 59   Temp:      SpO2: 98% 99% 98%   Weight:      Height:       Body mass index is 37.87 kg/m.  Nursing Note and Vital Signs reviewed.  Physical Exam  Constitutional: Patient appears well-developed and well-nourished. No distress.  HENT: Head: Normocephalic and atraumatic. Ears: bilateral TMs with no erythema or effusion; Nose: Nose normal. Mouth/Throat: Oropharynx is clear and moist. No oropharyngeal exudate or tonsillar swelling.  Eyes: Conjunctivae and EOM are normal. No scleral icterus.  Pupils are equal, round, and reactive to light.  Neck: Normal range of motion. Neck supple. No JVD present. No thyromegaly present.  Cardiovascular: Normal rate, regular rhythm and normal heart sounds.  No murmur heard. No BLE edema. Pulmonary/Chest: Effort normal and breath sounds normal. No respiratory distress. Abdominal: Soft. Bowel sounds are normal, no distension. There is no tenderness. Umbilical hernia is present and about quarter-sized - it is on the right side and is soft, flesh colored, non-tender on palpation, and reducible when lying  supine. Musculoskeletal: Normal range of motion, no joint effusions. No gross deformities Neurological: Pt is alert and oriented to person, place, and time. No cranial nerve deficit. Coordination, balance, strength, speech and gait are normal.  Skin: Skin is warm and dry. No rash noted. No erythema.  Psychiatric: Patient has a normal mood and affect. behavior is normal. Judgment and thought content normal.  No results found for this or any previous visit (from the past 72 hour(s)).  Assessment & Plan 1. Lightheadedness - CBC w/Diff/Platelet - COMPLETE METABOLIC PANEL WITH GFR; Future - TSH - B12 and Folate Panel - VITAMIN D 25 Hydroxy (Vit-D Deficiency, Fractures) - Orthostatic vital signs - no orthostatic changes   2. Dyslipidemia - simvastatin (ZOCOR) 20 MG tablet; TAKE 1 TABLET(20 MG) BY MOUTH DAILY AT 6 PM  Dispense: 30 tablet; Refill: 1 - Lipid panel  3. Post-nasal drainage - fluticasone (FLONASE) 50 MCG/ACT nasal spray; Place 2 sprays into both nostrils daily.  Dispense: 16 g; Refill: 0  4. Gastroesophageal reflux disease without esophagitis - omeprazole (PRILOSEC) 40 MG capsule; TAKE 1 CAPSULE(40 MG) BY MOUTH DAILY  Dispense: 30 capsule; Refill: 1  5. Iron deficiency anemia, unspecified iron deficiency anemia type - CBC w/Diff/Platelet - Fe+TIBC+Fer  6. B12 deficiency - B12 and Folate Panel  7. Vitamin D deficiency - VITAMIN D 25 Hydroxy (Vit-D Deficiency, Fractures)  8. Umbilical hernia without obstruction and without gangrene Stable at this time, she does not want to see a surgeon because she cannot afford repair. Discussed red flags for incarcerated hernia and she verbalizes understanding.  -Red flags and when to present for emergency care or RTC including fever >101.7F, chest pain, shortness of breath, new/worsening/un-resolving symptoms, reviewed with patient at time of visit. Follow up and care instructions discussed and provided in AVS.

## 2018-11-04 ENCOUNTER — Other Ambulatory Visit: Payer: Self-pay | Admitting: Family Medicine

## 2018-11-04 DIAGNOSIS — E538 Deficiency of other specified B group vitamins: Secondary | ICD-10-CM

## 2018-11-04 DIAGNOSIS — E559 Vitamin D deficiency, unspecified: Secondary | ICD-10-CM

## 2018-11-04 DIAGNOSIS — D509 Iron deficiency anemia, unspecified: Secondary | ICD-10-CM

## 2018-11-04 DIAGNOSIS — E785 Hyperlipidemia, unspecified: Secondary | ICD-10-CM

## 2018-11-04 LAB — IRON,TIBC AND FERRITIN PANEL
%SAT: 13 % (calc) — ABNORMAL LOW (ref 16–45)
Ferritin: 7 ng/mL — ABNORMAL LOW (ref 16–232)
Iron: 66 ug/dL (ref 45–160)
TIBC: 519 mcg/dL (calc) — ABNORMAL HIGH (ref 250–450)

## 2018-11-04 LAB — VITAMIN D 25 HYDROXY (VIT D DEFICIENCY, FRACTURES): Vit D, 25-Hydroxy: 14 ng/mL — ABNORMAL LOW (ref 30–100)

## 2018-11-04 LAB — CBC WITH DIFFERENTIAL/PLATELET
Absolute Monocytes: 403 cells/uL (ref 200–950)
Basophils Absolute: 22 cells/uL (ref 0–200)
Basophils Relative: 0.4 %
Eosinophils Absolute: 78 cells/uL (ref 15–500)
Eosinophils Relative: 1.4 %
HCT: 35 % (ref 35.0–45.0)
Hemoglobin: 11.7 g/dL (ref 11.7–15.5)
Lymphs Abs: 1630 cells/uL (ref 850–3900)
MCH: 28.3 pg (ref 27.0–33.0)
MCHC: 33.4 g/dL (ref 32.0–36.0)
MCV: 84.5 fL (ref 80.0–100.0)
MPV: 10.8 fL (ref 7.5–12.5)
Monocytes Relative: 7.2 %
Neutro Abs: 3466 cells/uL (ref 1500–7800)
Neutrophils Relative %: 61.9 %
Platelets: 290 10*3/uL (ref 140–400)
RBC: 4.14 10*6/uL (ref 3.80–5.10)
RDW: 14.2 % (ref 11.0–15.0)
Total Lymphocyte: 29.1 %
WBC: 5.6 10*3/uL (ref 3.8–10.8)

## 2018-11-04 LAB — COMPLETE METABOLIC PANEL WITH GFR
AG Ratio: 1.4 (calc) (ref 1.0–2.5)
ALT: 8 U/L (ref 6–29)
AST: 15 U/L (ref 10–35)
Albumin: 3.9 g/dL (ref 3.6–5.1)
Alkaline phosphatase (APISO): 119 U/L (ref 33–130)
BUN: 13 mg/dL (ref 7–25)
CO2: 28 mmol/L (ref 20–32)
Calcium: 9.3 mg/dL (ref 8.6–10.4)
Chloride: 105 mmol/L (ref 98–110)
Creat: 0.85 mg/dL (ref 0.50–1.05)
GFR, Est African American: 89 mL/min/{1.73_m2} (ref >=60)
GFR, Est Non African American: 77 mL/min/{1.73_m2} (ref >=60)
Globulin: 2.7 g/dL (calc) (ref 1.9–3.7)
Glucose, Bld: 94 mg/dL (ref 65–99)
Potassium: 4.2 mmol/L (ref 3.5–5.3)
Sodium: 141 mmol/L (ref 135–146)
Total Bilirubin: 0.4 mg/dL (ref 0.2–1.2)
Total Protein: 6.6 g/dL (ref 6.1–8.1)

## 2018-11-04 LAB — LIPID PANEL
Cholesterol: 217 mg/dL — ABNORMAL HIGH (ref <200)
HDL: 76 mg/dL (ref >50)
LDL Cholesterol (Calc): 120 mg/dL (calc) — ABNORMAL HIGH
Non-HDL Cholesterol (Calc): 141 mg/dL (calc) — ABNORMAL HIGH (ref <130)
Total CHOL/HDL Ratio: 2.9 (calc) (ref <5.0)
Triglycerides: 99 mg/dL (ref <150)

## 2018-11-04 LAB — B12 AND FOLATE PANEL
Folate: 7 ng/mL
Vitamin B-12: 256 pg/mL (ref 200–1100)

## 2018-11-04 LAB — TSH: TSH: 1.68 mIU/L

## 2018-11-04 MED ORDER — SIMVASTATIN 40 MG PO TABS: 40.0000 mg | ORAL_TABLET | Freq: Every day | ORAL | 3 refills | Status: DC

## 2018-11-04 MED ORDER — VITAMIN D (ERGOCALCIFEROL) 1.25 MG (50000 UNIT) PO CAPS: 50000.0000 [IU] | ORAL_CAPSULE | ORAL | 0 refills | Status: DC

## 2018-11-07 ENCOUNTER — Encounter: Payer: Self-pay | Admitting: Internal Medicine

## 2018-11-07 ENCOUNTER — Inpatient Hospital Stay: Payer: BLUE CROSS/BLUE SHIELD | Attending: Internal Medicine | Admitting: Internal Medicine

## 2018-11-07 VITALS — BP 132/85 | HR 68 | Temp 97.4°F | Wt 244.0 lb

## 2018-11-07 DIAGNOSIS — R1013 Epigastric pain: Secondary | ICD-10-CM | POA: Diagnosis not present

## 2018-11-07 DIAGNOSIS — R5383 Other fatigue: Secondary | ICD-10-CM | POA: Insufficient documentation

## 2018-11-07 DIAGNOSIS — Z9884 Bariatric surgery status: Secondary | ICD-10-CM | POA: Insufficient documentation

## 2018-11-07 DIAGNOSIS — Z90710 Acquired absence of both cervix and uterus: Secondary | ICD-10-CM | POA: Insufficient documentation

## 2018-11-07 DIAGNOSIS — M1990 Unspecified osteoarthritis, unspecified site: Secondary | ICD-10-CM | POA: Diagnosis not present

## 2018-11-07 DIAGNOSIS — K219 Gastro-esophageal reflux disease without esophagitis: Secondary | ICD-10-CM | POA: Insufficient documentation

## 2018-11-07 DIAGNOSIS — I10 Essential (primary) hypertension: Secondary | ICD-10-CM | POA: Insufficient documentation

## 2018-11-07 DIAGNOSIS — D508 Other iron deficiency anemias: Secondary | ICD-10-CM | POA: Insufficient documentation

## 2018-11-07 DIAGNOSIS — Z79899 Other long term (current) drug therapy: Secondary | ICD-10-CM | POA: Diagnosis not present

## 2018-11-07 DIAGNOSIS — K909 Intestinal malabsorption, unspecified: Secondary | ICD-10-CM | POA: Insufficient documentation

## 2018-11-07 NOTE — Assessment & Plan Note (Addendum)
#  Iron deficiency-malabsorption symptomatic hemoglobin 11.7/low end of normal.  Ferritin 7 saturation 13%.  #Recommend IV iron infusion/Feraheme.  Discussed possible infusion related reaction.  #B12-question deficiency recommend sublingual B12.  Recommend checking B12 at next visit.  #Abdominal discomfort epigastric question gastritis recommend PPI.  If not improved then recommend follow-up with PCP.  # DISPOSITION: # IV ferrahem weekly x 2 [in [place]-early AM appts];  # follow up in 2 months/MD-[place]- labs- cbc/iron studies/ferritin/LDH/b12 levels- Possible ferrahem [[place]]  Thank you Dr.Sowles for allowing me to participate in the care of your pleasant patient. Please do not hesitate to contact me with questions or concerns in the interim.  # 45 minutes face-to-face with the patient discussing the above plan of care; more than 50% of time spent on prognosis/ natural history; counseling and coordination.

## 2018-11-07 NOTE — Progress Notes (Signed)
[facility] CONSULT NOTE  Patient Care Team: Alba Cory, MD as PCP - General (Family Medicine)  CHIEF COMPLAINTS/PURPOSE OF CONSULTATION: anemia   HEMATOLOGY HISTORY  # IRON DEF ANEMIA sec to gastric bypass; [EGD-nonecolonoscopy- 2015 Oct; repeat in 5 years]   # GASTRIC BYPASS [2005]; TAH   HISTORY OF PRESENTING ILLNESS:  Savannah Manning 58 y.o.  female has been referred to [place] for further evaluation/work-up for anemia.  Patient states that she has been very fatigued over the last few months.  Episode of dizzy spells prior to Christmas.  No syncopal episodes.  Patient had previous received IV iron for iron deficiency in the past.  No IV iron infusion the last 2 to 3 years.  She felt improved after IV iron infusions.  Denies any swelling in the legs.    Blood in stools: None Change in bowel habits- None Blood in urine: None Vaginal bleeding: None/ TAH Difficulty swallowing: None Abnormal weight loss: None Iron supplementation: None Gastric bypass: 2005.   Review of Systems  Constitutional: Negative for chills, diaphoresis, fever, malaise/fatigue and weight loss.  HENT: Negative for nosebleeds and sore throat.   Eyes: Negative for double vision.  Respiratory: Negative for cough, hemoptysis, sputum production, shortness of breath and wheezing.   Cardiovascular: Negative for chest pain, palpitations, orthopnea and leg swelling.  Gastrointestinal: Negative for abdominal pain, blood in stool, constipation, diarrhea, heartburn, melena, nausea and vomiting.  Genitourinary: Negative for dysuria, frequency and urgency.  Musculoskeletal: Negative for back pain and joint pain.  Skin: Negative.  Negative for itching and rash.  Neurological: Negative for dizziness, tingling, focal weakness, weakness and headaches.  Endo/Heme/Allergies: Does not bruise/bleed easily.  Psychiatric/Behavioral: Negative for depression. The patient is not nervous/anxious and does not have  insomnia.     MEDICAL HISTORY:  Past Medical History:  Diagnosis Date  . Anemia   . Arthritis   . Asthma   . GERD (gastroesophageal reflux disease)   . Headache   . Hypertension     SURGICAL HISTORY: Past Surgical History:  Procedure Laterality Date  . ABDOMINAL HYSTERECTOMY  2012  . CHOLECYSTECTOMY    . DILATION AND CURETTAGE OF UTERUS    . GASTRIC BYPASS  2005  . INDUCED ABORTION N/A 1997   patient was about 22 weeks  . SHOULDER ARTHROSCOPY WITH OPEN ROTATOR CUFF REPAIR Left 06/14/2016   Procedure: SHOULDER ARTHROSCOPY WITH OPEN ROTATOR CUFF REPAIR, DECOMPRESSION, EXCISION LOOSE BODY, DEBRIDEMENT;  Surgeon: Christena Flake, MD;  Location: [facility] ORS;  Service: Orthopedics;  Laterality: Left;    SOCIAL HISTORY: Social History   Socioeconomic History  . Marital status: Single    Spouse name: Not on file  . Number of children: 1  . Years of education: Not on file  . Highest education level: Not on file  Occupational History  . Not on file  Social Needs  . Financial resource strain: Not on file  . Food insecurity:    Worry: Not on file    Inability: Not on file  . Transportation needs:    Medical: Not on file    Non-medical: Not on file  Tobacco Use  . Smoking status: Never Smoker  . Smokeless tobacco: Never Used  Substance and Sexual Activity  . Alcohol use: No    Alcohol/week: 0.0 standard drinks  . Drug use: No  . Sexual activity: Not Currently  Lifestyle  . Physical activity:    Days per week: Not on file  Minutes per session: Not on file  . Stress: Not on file  Relationships  . Social connections:    Talks on phone: Not on file    Gets together: Not on file    Attends religious service: Not on file    Active member of club or organization: Not on file    Attends meetings of clubs or organizations: Not on file    Relationship status: Not on file  . Intimate partner violence:    Fear of current or ex partner: Not on file    Emotionally abused: Not on  file    Physically abused: Not on file    Forced sexual activity: Not on file  Other Topics Concern  . Not on file  Social History Narrative   Desk job/works for insurance; no smoking; no alcohol; [place] in [place].     FAMILY HISTORY: Family History  Problem Relation Age of Onset  . Hypertension Mother   . Cancer Father        prostate  . Heart disease Brother   . Breast cancer Cousin 40       mat cousin    ALLERGIES:  is allergic to lisinopril; ace inhibitors; codeine; and valsartan.  MEDICATIONS:  Current Outpatient Medications  Medication Sig Dispense Refill  . Butalbital-APAP-Caffeine 50-300-40 MG CAPS Take 1 capsule by mouth daily as needed (migraine).     . fluticasone (FLONASE) 50 MCG/ACT nasal spray Place 2 sprays into both nostrils daily. 16 g 0  . fluticasone furoate-vilanterol (BREO ELLIPTA) 100-25 MCG/INH AEPB Inhale 1 puff into the lungs daily. (Patient taking differently: Inhale 1 puff into the lungs as needed. ) 60 each 0  . hydrochlorothiazide (HYDRODIURIL) 25 MG tablet Take 1 tablet (25 mg total) by mouth daily. 30 tablet 1  . Loratadine 10 MG CAPS Take 1 capsule by mouth as needed.    [name]. omeprazole (PRILOSEC) 40 MG capsule TAKE 1 CAPSULE(40 MG) BY MOUTH DAILY 30 capsule 1  . Semaglutide (OZEMPIC) 1 MG/DOSE SOPN Inject 1 mL into the skin once a week. 9 mL 1  . simvastatin (ZOCOR) 40 MG tablet Take 1 tablet (40 mg total) by mouth at bedtime. 90 tablet 3  . traZODone (DESYREL) 50 MG tablet Take 1 tablet (50 mg total) by mouth at bedtime. (Patient taking differently: Take 50 mg by mouth as needed. ) 90 tablet 1  . Vitamin D, Ergocalciferol, (DRISDOL) 1.25 MG (50000 UT) CAPS capsule Take 1 capsule (50,000 Units total) by mouth every 7 (seven) days. 10 capsule 0   No current facility-administered medications for this visit.       PHYSICAL EXAMINATION:   Vitals:   11/07/18 1153  BP: 132/85  Pulse: 68  Temp: (!) 97.4 F (36.3 C)   Filed Weights   11/07/18  1153  Weight: 244 lb (110.7 kg)    Physical Exam  Constitutional: She is oriented to person, place, and time and well-developed, well-nourished, and in no distress.  HENT:  Head: Normocephalic and atraumatic.  Mouth/Throat: Oropharynx is clear and moist. No oropharyngeal exudate.  Eyes: Pupils are equal, round, and reactive to light.  Neck: Normal range of motion. Neck supple.  Cardiovascular: Normal rate and regular rhythm.  Pulmonary/Chest: No respiratory distress. She has no wheezes.  Abdominal: Soft. Bowel sounds are normal. She exhibits no distension and no mass. There is no abdominal tenderness. There is no rebound and no guarding.  Musculoskeletal: Normal range of motion.  General: No tenderness or edema.  Neurological: She is alert and oriented to person, place, and time.  Skin: Skin is warm.  Psychiatric: Affect normal.     LABORATORY DATA:  I have reviewed the data as listed Lab Results  Component Value Date   WBC 5.6 11/03/2018   HGB 11.7 11/03/2018   HCT 35.0 11/03/2018   MCV 84.5 11/03/2018   PLT 290 11/03/2018   Recent Labs    11/03/18 0853  NA 141  K 4.2  CL 105  CO2 28  GLUCOSE 94  BUN 13  CREATININE 0.85  CALCIUM 9.3  GFRNONAA 77  GFRAA 89  PROT 6.6  AST 15  ALT 8  BILITOT 0.4     Mm 3d Screen Breast Bilateral  Result Date: 11/04/2018 CLINICAL DATA:  Screening. EXAM: DIGITAL SCREENING BILATERAL MAMMOGRAM WITH TOMO AND CAD COMPARISON:  Previous exam(s). ACR Breast Density Category b: There are scattered areas of fibroglandular density. FINDINGS: There are no findings suspicious for malignancy. Images were processed with CAD. IMPRESSION: No mammographic evidence of malignancy. A result letter of this screening mammogram will be mailed directly to the patient. RECOMMENDATION: Screening mammogram in one year. (Code:SM-B-01Y) BI-RADS CATEGORY  1: Negative. Electronically Signed   By: Beckie SaltsSteven  Reid M.D.   On: 11/04/2018 09:15    Iron  deficiency anemia due to chronic blood loss     Malabsorption of iron #Iron deficiency-malabsorption symptomatic hemoglobin 11.7/low end of normal.  Ferritin 7 saturation 13%.  #Recommend IV iron infusion/Feraheme.  Discussed possible infusion related reaction.  #B12-question deficiency recommend sublingual B12.  Recommend checking B12 at next visit.  #Abdominal discomfort epigastric question gastritis recommend PPI.  If not improved then recommend follow-up with PCP.  # DISPOSITION: # IV ferrahem weekly x 2 [in [place]-early AM appts];  # follow up in 2 months/MD-[place]- labs- cbc/iron studies/ferritin/LDH/b12 levels- Possible ferrahem [[place]]  Thank you Dr.Sowles for allowing me to participate in the care of your pleasant patient. Please do not hesitate to contact me with questions or concerns in the interim.  # 45 minutes face-to-face with the patient discussing the above plan of care; more than 50% of time spent on prognosis/ natural history; counseling and coordination.  All questions were answered. The patient knows to call the clinic with any problems, questions or concerns.    Earna CoderGovinda R Marcia Lepera, MD 11/07/2018 1:25 PM

## 2018-11-07 NOTE — Patient Instructions (Signed)
#  Take sublingual B12 thousand micrograms daily; [over-the-counter]

## 2018-11-11 ENCOUNTER — Inpatient Hospital Stay: Payer: BLUE CROSS/BLUE SHIELD

## 2018-11-11 VITALS — BP 122/74 | HR 69 | Temp 98.2°F | Resp 16

## 2018-11-11 DIAGNOSIS — R1013 Epigastric pain: Secondary | ICD-10-CM | POA: Diagnosis not present

## 2018-11-11 DIAGNOSIS — Z9884 Bariatric surgery status: Secondary | ICD-10-CM | POA: Diagnosis not present

## 2018-11-11 DIAGNOSIS — D508 Other iron deficiency anemias: Secondary | ICD-10-CM | POA: Diagnosis not present

## 2018-11-11 DIAGNOSIS — I10 Essential (primary) hypertension: Secondary | ICD-10-CM | POA: Diagnosis not present

## 2018-11-11 DIAGNOSIS — K909 Intestinal malabsorption, unspecified: Secondary | ICD-10-CM | POA: Diagnosis not present

## 2018-11-11 DIAGNOSIS — Z90710 Acquired absence of both cervix and uterus: Secondary | ICD-10-CM | POA: Diagnosis not present

## 2018-11-11 DIAGNOSIS — R5383 Other fatigue: Secondary | ICD-10-CM | POA: Diagnosis not present

## 2018-11-11 DIAGNOSIS — M1990 Unspecified osteoarthritis, unspecified site: Secondary | ICD-10-CM | POA: Diagnosis not present

## 2018-11-11 DIAGNOSIS — Z79899 Other long term (current) drug therapy: Secondary | ICD-10-CM | POA: Diagnosis not present

## 2018-11-11 DIAGNOSIS — K219 Gastro-esophageal reflux disease without esophagitis: Secondary | ICD-10-CM | POA: Diagnosis not present

## 2018-11-11 MED ORDER — SODIUM CHLORIDE 0.9 % IV SOLN
510.0000 mg | Freq: Once | INTRAVENOUS | Status: AC
  Administered 2018-11-11: 510 mg via INTRAVENOUS
  Filled 2018-11-11: qty 17

## 2018-11-11 MED ORDER — SODIUM CHLORIDE 0.9 % IV SOLN
Freq: Once | INTRAVENOUS | Status: AC
  Administered 2018-11-11: 09:00:00 via INTRAVENOUS
  Filled 2018-11-11: qty 250

## 2018-11-11 NOTE — Patient Instructions (Signed)

## 2018-11-18 ENCOUNTER — Inpatient Hospital Stay: Payer: BLUE CROSS/BLUE SHIELD

## 2018-11-18 VITALS — BP 134/87 | HR 53 | Temp 96.0°F | Resp 18

## 2018-11-18 DIAGNOSIS — Z9884 Bariatric surgery status: Secondary | ICD-10-CM | POA: Diagnosis not present

## 2018-11-18 DIAGNOSIS — M1990 Unspecified osteoarthritis, unspecified site: Secondary | ICD-10-CM | POA: Diagnosis not present

## 2018-11-18 DIAGNOSIS — K909 Intestinal malabsorption, unspecified: Secondary | ICD-10-CM | POA: Diagnosis not present

## 2018-11-18 DIAGNOSIS — R5383 Other fatigue: Secondary | ICD-10-CM | POA: Diagnosis not present

## 2018-11-18 DIAGNOSIS — D508 Other iron deficiency anemias: Secondary | ICD-10-CM | POA: Diagnosis not present

## 2018-11-18 DIAGNOSIS — Z90710 Acquired absence of both cervix and uterus: Secondary | ICD-10-CM | POA: Diagnosis not present

## 2018-11-18 DIAGNOSIS — K219 Gastro-esophageal reflux disease without esophagitis: Secondary | ICD-10-CM | POA: Diagnosis not present

## 2018-11-18 DIAGNOSIS — R1013 Epigastric pain: Secondary | ICD-10-CM | POA: Diagnosis not present

## 2018-11-18 DIAGNOSIS — Z79899 Other long term (current) drug therapy: Secondary | ICD-10-CM | POA: Diagnosis not present

## 2018-11-18 DIAGNOSIS — I10 Essential (primary) hypertension: Secondary | ICD-10-CM | POA: Diagnosis not present

## 2018-11-18 MED ORDER — SODIUM CHLORIDE 0.9 % IV SOLN
Freq: Once | INTRAVENOUS | Status: AC
  Administered 2018-11-18: 09:00:00 via INTRAVENOUS
  Filled 2018-11-18: qty 250

## 2018-11-18 MED ORDER — SODIUM CHLORIDE 0.9 % IV SOLN
510.0000 mg | Freq: Once | INTRAVENOUS | Status: AC
  Administered 2018-11-18: 510 mg via INTRAVENOUS
  Filled 2018-11-18: qty 17

## 2018-12-05 ENCOUNTER — Encounter: Payer: Self-pay | Admitting: Family Medicine

## 2018-12-05 ENCOUNTER — Other Ambulatory Visit: Payer: Self-pay

## 2018-12-05 ENCOUNTER — Ambulatory Visit (INDEPENDENT_AMBULATORY_CARE_PROVIDER_SITE_OTHER): Payer: BLUE CROSS/BLUE SHIELD | Admitting: Family Medicine

## 2018-12-05 DIAGNOSIS — R7303 Prediabetes: Secondary | ICD-10-CM | POA: Insufficient documentation

## 2018-12-05 DIAGNOSIS — R739 Hyperglycemia, unspecified: Secondary | ICD-10-CM | POA: Insufficient documentation

## 2018-12-05 DIAGNOSIS — K219 Gastro-esophageal reflux disease without esophagitis: Secondary | ICD-10-CM

## 2018-12-05 DIAGNOSIS — E538 Deficiency of other specified B group vitamins: Secondary | ICD-10-CM

## 2018-12-05 DIAGNOSIS — Z9884 Bariatric surgery status: Secondary | ICD-10-CM

## 2018-12-05 DIAGNOSIS — E785 Hyperlipidemia, unspecified: Secondary | ICD-10-CM

## 2018-12-05 DIAGNOSIS — D509 Iron deficiency anemia, unspecified: Secondary | ICD-10-CM

## 2018-12-05 DIAGNOSIS — E559 Vitamin D deficiency, unspecified: Secondary | ICD-10-CM

## 2018-12-05 DIAGNOSIS — I10 Essential (primary) hypertension: Secondary | ICD-10-CM

## 2018-12-05 HISTORY — DX: Iron deficiency anemia, unspecified: D50.9

## 2018-12-05 MED ORDER — SEMAGLUTIDE (1 MG/DOSE) 2 MG/1.5ML ~~LOC~~ SOPN: 1.0000 mL | PEN_INJECTOR | SUBCUTANEOUS | 1 refills | Status: DC

## 2018-12-05 MED ORDER — OMEPRAZOLE 40 MG PO CPDR: DELAYED_RELEASE_CAPSULE | ORAL | 0 refills | Status: DC

## 2018-12-05 MED ORDER — HYDROCHLOROTHIAZIDE 25 MG PO TABS: 25.0000 mg | ORAL_TABLET | Freq: Every day | ORAL | 1 refills | Status: DC

## 2018-12-05 NOTE — Assessment & Plan Note (Signed)
Continue HCTZ. DASH diet discussed

## 2018-12-05 NOTE — Progress Notes (Addendum)
Established Patient Office Visit  Subjective:  Patient ID: Savannah Manning, female    DOB: [birth year]  Age: 58 y.o. MRN: [id number]  CC:  Chief Complaint  Patient presents with  . Annual Exam    HPI Savannah Manning presents for follow up:  Obesity/Prediabetes:She started Ozempic 04/2017 and has been doing well - had nausea at first, but this has improved. She has been obese since childhood. She has tried multiple diets in the past, she hadgastric bypass in 2005. Weight before surgery was 314 lbs, she went down to 164 lbs, but has been gradually gaining weight, gained about 100lbs back.  She is down 6lbs today.She had [facility] done at [facility],and was denied for a revision in the summer 2018. She has failed Metformin, gained 9 lbs while on it since 12/2016.She denies polyphagia, polydipsia or polyuria. Does not exercise now that summer is over - considered joining a gym but doesn't have time. Has been eating breakfast,lunch and dinner but smaller portions.  Iron deficiency anemia: she had bypass surgery in 2005 and developed anemia a few years later, she had infusions in the past. Currently denies any pica. She does like red meat, eating chicken, and green vegetables(collards, salads etc.), occasionally beans. No blood in stools. She is seeing Dr. Leonard Schwartz with Heme/Onc for iron infusions - most recent visit was January 2020.  GERD: she is doing well now, taking Omeprazole most nights, symptoms of epigastric burning and occasionally regurgitationat night while sleeping. Does elevate HOB but this doesn't help. Stopped eating any red sauces and spicy foods. Also discussed fried/fatty foods/heavy meals. Discussed long term risk of PPI use.  She is taking omeprazole only as needed at this time.  Hyperlipidemia: taking simvastatin - increased to 40 mg with last LDL check. No myalgias, chest pain or shortness of breath.  HTN: She denies chest pain; she no longer has palpitations,and she  has not been taking her Lopressor in a few months, and BP has been trending up, she has HCTZ on her active medication list and she states she has been taking it. Explained that we will need to resume Lopressor if bp remains elevated.  Right knee pain: she states same symptoms that she had prior to bypass surgery, aching and stiff right knee. No redness or increase in warmth.Denies problems with ambulation. Stable. B12 and vitamin D deficiency:She is on MVI, last check showed Vitamin D and B12 still low - Rx for Vitamin D given, she will start sublingual B12.  Advanced Care Planning: A voluntary discussion about advance care planning including the explanation and discussion of advance directives.  Discussed health care proxy and Living will, and the patient was able to identify a health care proxy as [place].  Patient does not have a living will at present time. If patient does have living will, I have requested they bring this to the clinic to be scanned in to their chart.  Past Medical History:  Diagnosis Date  . Anemia   . Arthritis   . Asthma   . GERD (gastroesophageal reflux disease)   . Headache   . Hypertension     Past Surgical History:  Procedure Laterality Date  . ABDOMINAL HYSTERECTOMY  2012  . CHOLECYSTECTOMY    . DILATION AND CURETTAGE OF UTERUS    . GASTRIC BYPASS  2005  . INDUCED ABORTION N/A 1997   patient was about 22 weeks  . SHOULDER ARTHROSCOPY WITH OPEN ROTATOR CUFF REPAIR Left 06/14/2016   Procedure:  SHOULDER ARTHROSCOPY WITH OPEN ROTATOR CUFF REPAIR, DECOMPRESSION, EXCISION LOOSE BODY, DEBRIDEMENT;  Surgeon: Christena FlakeJohn J Poggi, MD;  Location: [facility] ORS;  Service: Orthopedics;  Laterality: Left;    Family History  Problem Relation Age of Onset  . Hypertension Mother   . Cancer Father        prostate  . Heart disease Brother   . Breast cancer Cousin 40       mat cousin    Social History   Socioeconomic History  . Marital status: Single    Spouse name:  Not on file  . Number of children: 1  . Years of education: Not on file  . Highest education level: Not on file  Occupational History  . Not on file  Social Needs  . Financial resource strain: Not hard at all  . Food insecurity:    Worry: Never true    Inability: Never true  . Transportation needs:    Medical: No    Non-medical: No  Tobacco Use  . Smoking status: Never Smoker  . Smokeless tobacco: Never Used  Substance and Sexual Activity  . Alcohol use: No    Alcohol/week: 0.0 standard drinks  . Drug use: No  . Sexual activity: Not Currently  Lifestyle  . Physical activity:    Days per week: 0 days    Minutes per session: 0 min  . Stress: Not at all  Relationships  . Social connections:    Talks on phone: More than three times a week    Gets together: More than three times a week    Attends religious service: 1 to 4 times per year    Active member of club or organization: Not on file    Attends meetings of clubs or organizations: Never    Relationship status: Never married  . Intimate partner violence:    Fear of current or ex partner: No    Emotionally abused: No    Physically abused: No    Forced sexual activity: No  Other Topics Concern  . Not on file  Social History Narrative   Desk job/works for insurance; no smoking; no alcohol; [place].     Outpatient Medications Prior to Visit  Medication Sig Dispense Refill  . Butalbital-APAP-Caffeine 50-300-40 MG CAPS Take 1 capsule by mouth daily as needed (migraine).     . fluticasone (FLONASE) 50 MCG/ACT nasal spray Place 2 sprays into both nostrils daily. 16 g 0  . fluticasone furoate-vilanterol (BREO ELLIPTA) 100-25 MCG/INH AEPB Inhale 1 puff into the lungs daily. (Patient taking differently: Inhale 1 puff into the lungs as needed. ) 60 each 0  . Loratadine 10 MG CAPS Take 1 capsule by mouth as needed.    . simvastatin (ZOCOR) 40 MG tablet Take 1 tablet (40 mg total) by mouth at bedtime. 90 tablet 3  .  traZODone (DESYREL) 50 MG tablet Take 1 tablet (50 mg total) by mouth at bedtime. (Patient taking differently: Take 50 mg by mouth as needed. ) 90 tablet 1  . Vitamin D, Ergocalciferol, (DRISDOL) 1.25 MG (50000 UT) CAPS capsule Take 1 capsule (50,000 Units total) by mouth every 7 (seven) days. 10 capsule 0  . hydrochlorothiazide (HYDRODIURIL) 25 MG tablet Take 1 tablet (25 mg total) by mouth daily. 30 tablet 1  . omeprazole (PRILOSEC) 40 MG capsule TAKE 1 CAPSULE(40 MG) BY MOUTH DAILY 30 capsule 1  . Semaglutide (OZEMPIC) 1 MG/DOSE SOPN Inject 1 mL into the skin once a week. 9  mL 1   No facility-administered medications prior to visit.     Allergies  Allergen Reactions  . Lisinopril Swelling    Facial Swelling  . Ace Inhibitors     angioedema  . Codeine Swelling  . Valsartan Swelling    ROS Review of Systems  Constitutional: Negative.   HENT: Negative.   Eyes: Negative.   Respiratory: Negative.   Cardiovascular: Negative.   Gastrointestinal: Negative.   Genitourinary: Negative.   Musculoskeletal: Negative.   Skin: Negative.   Neurological: Negative.   Hematological: Negative.   All other systems reviewed and are negative.     Objective:    Physical Exam  Constitutional: She is oriented to person, place, and time. She appears well-developed and well-nourished. No distress.  HENT:  Head: Normocephalic and atraumatic.  Right Ear: External ear normal.  Left Ear: External ear normal.  Nose: Nose normal.  Mouth/Throat: Oropharynx is clear and moist. No oropharyngeal exudate.  Eyes: Pupils are equal, round, and reactive to light. Conjunctivae and EOM are normal.  Neck: Normal range of motion. Neck supple. No JVD present. No thyromegaly present.  Cardiovascular: Normal rate and regular rhythm. Exam reveals no gallop and no friction rub.  No murmur heard. Pulmonary/Chest: Breath sounds normal. She has no wheezes. She has no rales. She exhibits no tenderness.    Musculoskeletal: Normal range of motion.        General: No tenderness or edema.  Lymphadenopathy:    She has no cervical adenopathy.  Neurological: She is alert and oriented to person, place, and time. No cranial nerve deficit.  Skin: Skin is warm and dry. No rash noted.  Psychiatric: She has a normal mood and affect. Her behavior is normal. Judgment and thought content normal.  Nursing note and vitals reviewed.   BP 130/82 (BP Location: Right Arm, Patient Position: Sitting, Cuff Size: Large)   Pulse 77   Temp 98 F (36.7 C) (Oral)   Resp 16   Ht 5\' 7"  (1.702 m)   Wt 238 lb 11.2 oz (108.3 kg)   SpO2 92%   BMI 37.39 kg/m  Wt Readings from Last 3 Encounters:  12/05/18 238 lb 11.2 oz (108.3 kg)  11/07/18 244 lb (110.7 kg)  11/03/18 241 lb 12.8 oz (109.7 kg)    There are no preventive care reminders to display for this patient.  There are no preventive care reminders to display for this patient.  Lab Results  Component Value Date   TSH 1.68 11/03/2018   Lab Results  Component Value Date   WBC 5.6 11/03/2018   HGB 11.7 11/03/2018   HCT 35.0 11/03/2018   MCV 84.5 11/03/2018   PLT 290 11/03/2018   Lab Results  Component Value Date   NA 141 11/03/2018   K 4.2 11/03/2018   CO2 28 11/03/2018   GLUCOSE 94 11/03/2018   BUN 13 11/03/2018   CREATININE 0.85 11/03/2018   BILITOT 0.4 11/03/2018   ALKPHOS 109 05/01/2017   AST 15 11/03/2018   ALT 8 11/03/2018   PROT 6.6 11/03/2018   ALBUMIN 3.9 05/01/2017   CALCIUM 9.3 11/03/2018   ANIONGAP 7 06/07/2016   Lab Results  Component Value Date   CHOL 217 (H) 11/03/2018   Lab Results  Component Value Date   HDL 76 11/03/2018   Lab Results  Component Value Date   LDLCALC 120 (H) 11/03/2018   Lab Results  Component Value Date   TRIG 99 11/03/2018   Lab Results  Component  Value Date   CHOLHDL 2.9 11/03/2018   Lab Results  Component Value Date   HGBA1C 5.6 11/06/2017      Assessment & Plan:   Problem List  Items Addressed This Visit      Cardiovascular and Mediastinum   Benign essential HTN    Continue HCTZ. DASH diet discussed      Relevant Medications   hydrochlorothiazide (HYDRODIURIL) 25 MG tablet     Digestive   Gastroesophageal reflux disease without esophagitis   Relevant Medications   omeprazole (PRILOSEC) 40 MG capsule     Other   Dyslipidemia    Continue current meds, will recheck fasting in 3 months      Morbid obesity, unspecified obesity type (HCC) - Primary    Discussed importance of 150 minutes of physical activity weekly, eat two servings of fish weekly, eat one serving of tree nuts ( cashews, pistachios, pecans, almonds.[name].) every other day, eat 6 servings of fruit/vegetables daily and drink plenty of water and avoid sweet beverages.        Relevant Medications   Semaglutide, 1 MG/DOSE, (OZEMPIC, 1 MG/DOSE,) 2 MG/1.5ML SOPN   History of bariatric surgery   Vitamin D deficiency    Continue supplementation      B12 deficiency    Start sublingual supplementation      Pre-diabetes    Continue Ozempic. Diet discussed in detail      Relevant Medications   Semaglutide, 1 MG/DOSE, (OZEMPIC, 1 MG/DOSE,) 2 MG/1.5ML SOPN   Hyperglycemia   Relevant Medications   Semaglutide, 1 MG/DOSE, (OZEMPIC, 1 MG/DOSE,) 2 MG/1.5ML SOPN   Iron deficiency anemia    Continue follow up with Hematology.         Meds ordered this encounter  Medications  . omeprazole (PRILOSEC) 40 MG capsule    Sig: TAKE 1 CAPSULE(40 MG) BY MOUTH DAILY    Dispense:  90 capsule    Refill:  0    Order Specific Question:   Supervising Provider    Answer:   Alba CorySOWLES, KRICHNA [[id number]]  . Semaglutide, 1 MG/DOSE, (OZEMPIC, 1 MG/DOSE,) 2 MG/1.5ML SOPN    Sig: Inject 1 mL into the skin once a week.    Dispense:  9 mL    Refill:  1    Order Specific Question:   Supervising Provider    Answer:   Alba CorySOWLES, KRICHNA [[id number]]  . hydrochlorothiazide (HYDRODIURIL) 25 MG tablet    Sig: Take 1 tablet (25 mg  total) by mouth daily.    Dispense:  30 tablet    Refill:  1    Order Specific Question:   Supervising Provider    Answer:   Alba CorySOWLES, KRICHNA [[id number]]    Follow-up: Return in about 3 months (around 03/05/2019).    Doren CustardEmily E , FNP

## 2018-12-05 NOTE — Assessment & Plan Note (Signed)
Start sublingual supplementation

## 2018-12-05 NOTE — Assessment & Plan Note (Signed)
Continue follow-up with Hematology.

## 2018-12-05 NOTE — Assessment & Plan Note (Signed)
Continue Ozempic. Diet discussed in detail

## 2018-12-05 NOTE — Assessment & Plan Note (Signed)
Discussed importance of 150 minutes of physical activity weekly, eat two servings of fish weekly, eat one serving of tree nuts ( cashews, pistachios, pecans, almonds..) every other day, eat 6 servings of fruit/vegetables daily and drink plenty of water and avoid sweet beverages. 

## 2018-12-05 NOTE — Assessment & Plan Note (Signed)
Continue current meds, will recheck fasting in 3 months

## 2018-12-05 NOTE — Patient Instructions (Signed)

## 2018-12-05 NOTE — Assessment & Plan Note (Signed)
Continue supplementation  ?

## 2018-12-08 ENCOUNTER — Telehealth: Payer: Self-pay | Admitting: Family Medicine

## 2018-12-08 NOTE — Telephone Encounter (Signed)
Copied from CRM [phone]#[id number]. Topic: Quick Communication - See Telephone Encounter >> Dec 08, 2018  8:57 AM Jens SomMedley, Jennifer A wrote: CRM for notification. See Telephone encounter for: 12/08/18.  Patient is calling regarding Semaglutide, 1 MG/DOSE, (OZEMPIC, 1 MG/DOSE,) 2 MG/1.5ML SOPN [[id number]][[id number]]   She was given a sample  it goes up to .05mg  She she take two to equal the 1 mg. Please advise [phone]

## 2018-12-08 NOTE — Telephone Encounter (Signed)
No she has to slowly go up on medication, first 3 weeks at 0.25, after that 0.5 weekly and only goes to 1 mg once finished with sample

## 2018-12-10 ENCOUNTER — Other Ambulatory Visit: Payer: Self-pay | Admitting: Family Medicine

## 2018-12-10 NOTE — Telephone Encounter (Signed)
So she needs to use two shots of 0.5 mg on sample

## 2018-12-10 NOTE — Telephone Encounter (Signed)
Patient states she has been on the 1 mg for a while and is doing fine Ozempic.

## 2019-01-09 ENCOUNTER — Inpatient Hospital Stay: Payer: BLUE CROSS/BLUE SHIELD

## 2019-01-09 ENCOUNTER — Inpatient Hospital Stay: Payer: BLUE CROSS/BLUE SHIELD | Admitting: Internal Medicine

## 2019-01-13 ENCOUNTER — Other Ambulatory Visit: Payer: Self-pay

## 2019-01-13 DIAGNOSIS — D509 Iron deficiency anemia, unspecified: Secondary | ICD-10-CM

## 2019-01-16 ENCOUNTER — Encounter: Payer: Self-pay | Admitting: Internal Medicine

## 2019-01-16 ENCOUNTER — Inpatient Hospital Stay (HOSPITAL_BASED_OUTPATIENT_CLINIC_OR_DEPARTMENT_OTHER): Payer: BLUE CROSS/BLUE SHIELD | Admitting: Internal Medicine

## 2019-01-16 ENCOUNTER — Inpatient Hospital Stay: Payer: BLUE CROSS/BLUE SHIELD | Attending: Internal Medicine

## 2019-01-16 ENCOUNTER — Inpatient Hospital Stay: Payer: BLUE CROSS/BLUE SHIELD

## 2019-01-16 ENCOUNTER — Other Ambulatory Visit: Payer: Self-pay

## 2019-01-16 VITALS — BP 130/85 | HR 58 | Temp 96.2°F | Resp 18 | Wt 231.4 lb

## 2019-01-16 DIAGNOSIS — K909 Intestinal malabsorption, unspecified: Secondary | ICD-10-CM | POA: Insufficient documentation

## 2019-01-16 DIAGNOSIS — Z79899 Other long term (current) drug therapy: Secondary | ICD-10-CM

## 2019-01-16 DIAGNOSIS — I10 Essential (primary) hypertension: Secondary | ICD-10-CM | POA: Diagnosis not present

## 2019-01-16 DIAGNOSIS — D508 Other iron deficiency anemias: Secondary | ICD-10-CM

## 2019-01-16 DIAGNOSIS — Z90710 Acquired absence of both cervix and uterus: Secondary | ICD-10-CM

## 2019-01-16 DIAGNOSIS — Z9884 Bariatric surgery status: Secondary | ICD-10-CM | POA: Diagnosis not present

## 2019-01-16 DIAGNOSIS — D509 Iron deficiency anemia, unspecified: Secondary | ICD-10-CM

## 2019-01-16 DIAGNOSIS — E538 Deficiency of other specified B group vitamins: Secondary | ICD-10-CM

## 2019-01-16 LAB — COMPREHENSIVE METABOLIC PANEL
ALT: 12 U/L (ref 0–44)
AST: 16 U/L (ref 15–41)
Albumin: 4.2 g/dL (ref 3.5–5.0)
Alkaline Phosphatase: 114 U/L (ref 38–126)
Anion gap: 8 (ref 5–15)
BUN: 14 mg/dL (ref 6–20)
CO2: 27 mmol/L (ref 22–32)
Calcium: 9.3 mg/dL (ref 8.9–10.3)
Chloride: 105 mmol/L (ref 98–111)
Creatinine, Ser: 0.88 mg/dL (ref 0.44–1.00)
GFR calc Af Amer: >60 mL/min (ref >60)
GFR calc non Af Amer: >60 mL/min (ref >60)
Glucose, Bld: 98 mg/dL (ref 70–99)
Potassium: 3.8 mmol/L (ref 3.5–5.1)
Sodium: 140 mmol/L (ref 135–145)
Total Bilirubin: 0.8 mg/dL (ref 0.3–1.2)
Total Protein: 7.3 g/dL (ref 6.5–8.1)

## 2019-01-16 LAB — LACTATE DEHYDROGENASE: LDH: 130 U/L (ref 98–192)

## 2019-01-16 LAB — CBC WITH DIFFERENTIAL/PLATELET
Abs Immature Granulocytes: 0.01 10*3/uL (ref 0.00–0.07)
Basophils Absolute: 0 10*3/uL (ref 0.0–0.1)
Basophils Relative: 0 %
Eosinophils Absolute: 0.1 10*3/uL (ref 0.0–0.5)
Eosinophils Relative: 2 %
HCT: 36.9 % (ref 36.0–46.0)
Hemoglobin: 12.6 g/dL (ref 12.0–15.0)
Immature Granulocytes: 0 %
Lymphocytes Relative: 34 %
Lymphs Abs: 1.7 10*3/uL (ref 0.7–4.0)
MCH: 29.6 pg (ref 26.0–34.0)
MCHC: 34.1 g/dL (ref 30.0–36.0)
MCV: 86.8 fL (ref 80.0–100.0)
Monocytes Absolute: 0.3 10*3/uL (ref 0.1–1.0)
Monocytes Relative: 6 %
Neutro Abs: 2.8 10*3/uL (ref 1.7–7.7)
Neutrophils Relative %: 58 %
Platelets: 240 10*3/uL (ref 150–400)
RBC: 4.25 MIL/uL (ref 3.87–5.11)
RDW: 13.7 % (ref 11.5–15.5)
WBC: 4.8 10*3/uL (ref 4.0–10.5)
nRBC: 0 % (ref 0.0–0.2)

## 2019-01-16 LAB — IRON AND TIBC
Iron: 113 ug/dL (ref 28–170)
Saturation Ratios: 29 % (ref 10.4–31.8)
TIBC: 384 ug/dL (ref 250–450)
UIBC: 271 ug/dL

## 2019-01-16 LAB — FERRITIN: Ferritin: 173 ng/mL (ref 11–307)

## 2019-01-16 LAB — VITAMIN B12: Vitamin B-12: 157 pg/mL — ABNORMAL LOW (ref 180–914)

## 2019-01-16 NOTE — Progress Notes (Signed)
[facility] CONSULT NOTE  Patient Care Team: Alba Cory, MD as PCP - General (Family Medicine)  CHIEF COMPLAINTS/PURPOSE OF CONSULTATION: anemia   HEMATOLOGY HISTORY  # IRON DEF ANEMIA sec to gastric bypass; [EGD-nonecolonoscopy- 2015 Oct; repeat in 5 years]   # B12 deficiency- on SL B12.  # GASTRIC BYPASS [2005]; TAH   HISTORY OF PRESENTING ILLNESS:  Savannah Manning 58 y.o.  female iron deficient anemia/B12 deficiency secondary to malabsorption here for follow-up.  Patient received IV iron x2; she did not notice significant improvement in the fatigue.  She admits that she is under a lot of social stress at home.  Patient states that she is on B12 sublingual.  Denies any worsening abdominal pain.   Review of Systems  Constitutional: Positive for malaise/fatigue. Negative for chills, diaphoresis, fever and weight loss.  HENT: Negative for nosebleeds and sore throat.   Eyes: Negative for double vision.  Respiratory: Negative for cough, hemoptysis, sputum production, shortness of breath and wheezing.   Cardiovascular: Negative for chest pain, palpitations, orthopnea and leg swelling.  Gastrointestinal: Negative for abdominal pain, blood in stool, constipation, diarrhea, heartburn, melena, nausea and vomiting.  Genitourinary: Negative for dysuria, frequency and urgency.  Musculoskeletal: Negative for back pain and joint pain.  Skin: Negative.  Negative for itching and rash.  Neurological: Negative for dizziness, tingling, focal weakness, weakness and headaches.  Endo/Heme/Allergies: Does not bruise/bleed easily.  Psychiatric/Behavioral: Negative for depression. The patient is not nervous/anxious and does not have insomnia.     MEDICAL HISTORY:  Past Medical History:  Diagnosis Date  . Anemia   . Arthritis   . Asthma   . GERD (gastroesophageal reflux disease)   . Headache   . Hypertension     SURGICAL HISTORY: Past Surgical History:  Procedure  Laterality Date  . ABDOMINAL HYSTERECTOMY  2012  . CHOLECYSTECTOMY    . DILATION AND CURETTAGE OF UTERUS    . GASTRIC BYPASS  2005  . INDUCED ABORTION N/A 1997   patient was about 22 weeks  . SHOULDER ARTHROSCOPY WITH OPEN ROTATOR CUFF REPAIR Left 06/14/2016   Procedure: SHOULDER ARTHROSCOPY WITH OPEN ROTATOR CUFF REPAIR, DECOMPRESSION, EXCISION LOOSE BODY, DEBRIDEMENT;  Surgeon: Christena Flake, MD;  Location: [facility] ORS;  Service: Orthopedics;  Laterality: Left;    SOCIAL HISTORY: Social History   Socioeconomic History  . Marital status: Single    Spouse name: Not on file  . Number of children: 1  . Years of education: Not on file  . Highest education level: Not on file  Occupational History  . Not on file  Social Needs  . Financial resource strain: Not hard at all  . Food insecurity:    Worry: Never true    Inability: Never true  . Transportation needs:    Medical: No    Non-medical: No  Tobacco Use  . Smoking status: Never Smoker  . Smokeless tobacco: Never Used  Substance and Sexual Activity  . Alcohol use: No    Alcohol/week: 0.0 standard drinks  . Drug use: No  . Sexual activity: Not Currently  Lifestyle  . Physical activity:    Days per week: 0 days    Minutes per session: 0 min  . Stress: Not at all  Relationships  . Social connections:    Talks on phone: More than three times a week    Gets together: More than three times a week    Attends religious service: 1 to 4 times per  year    Active member of club or organization: Not on file    Attends meetings of clubs or organizations: Never    Relationship status: Never married  . Intimate partner violence:    Fear of current or ex partner: No    Emotionally abused: No    Physically abused: No    Forced sexual activity: No  Other Topics Concern  . Not on file  Social History Narrative   Desk job/works for insurance; no smoking; no alcohol; lives in [place].     FAMILY HISTORY: Family History  Problem  Relation Age of Onset  . Hypertension Mother   . Cancer Father        prostate  . Heart disease Brother   . Breast cancer Cousin 40       mat cousin    ALLERGIES:  is allergic to lisinopril; ace inhibitors; codeine; and valsartan.  MEDICATIONS:  Current Outpatient Medications  Medication Sig Dispense Refill  . Butalbital-APAP-Caffeine 50-300-40 MG CAPS Take 1 capsule by mouth daily as needed (migraine).     . fluticasone (FLONASE) 50 MCG/ACT nasal spray Place 2 sprays into both nostrils daily. 16 g 0  . fluticasone furoate-vilanterol (BREO ELLIPTA) 100-25 MCG/INH AEPB Inhale 1 puff into the lungs daily. (Patient taking differently: Inhale 1 puff into the lungs as needed. ) 60 each 0  . hydrochlorothiazide (HYDRODIURIL) 25 MG tablet Take 1 tablet (25 mg total) by mouth daily. 30 tablet 1  . Loratadine 10 MG CAPS Take 1 capsule by mouth as needed.    [name]. omeprazole (PRILOSEC) 40 MG capsule TAKE 1 CAPSULE(40 MG) BY MOUTH DAILY (Patient taking differently: TAKE 1 CAPSULE(40 MG) BY MOUTH DAILY PRN) 90 capsule 0  . Semaglutide, 1 MG/DOSE, (OZEMPIC, 1 MG/DOSE,) 2 MG/1.5ML SOPN Inject 1 mL into the skin once a week. 9 mL 1  . simvastatin (ZOCOR) 40 MG tablet Take 1 tablet (40 mg total) by mouth at bedtime. 90 tablet 3  . traZODone (DESYREL) 50 MG tablet Take 1 tablet (50 mg total) by mouth at bedtime. (Patient taking differently: Take 50 mg by mouth as needed. ) 90 tablet 1  . Vitamin D, Ergocalciferol, (DRISDOL) 1.25 MG (50000 UT) CAPS capsule Take 1 capsule (50,000 Units total) by mouth every 7 (seven) days. 10 capsule 0   No current facility-administered medications for this visit.       PHYSICAL EXAMINATION:   Vitals:   01/16/19 1332  BP: 130/85  Pulse: (!) 58  Resp: 18  Temp: (!) 96.2 F (35.7 C)   Filed Weights   01/16/19 1332  Weight: 231 lb 6.4 oz (105 kg)    Physical Exam  Constitutional: She is oriented to person, place, and time and well-developed, well-nourished, and  in no distress.  HENT:  Head: Normocephalic and atraumatic.  Mouth/Throat: Oropharynx is clear and moist. No oropharyngeal exudate.  Eyes: Pupils are equal, round, and reactive to light.  Neck: Normal range of motion. Neck supple.  Cardiovascular: Normal rate and regular rhythm.  Pulmonary/Chest: No respiratory distress. She has no wheezes.  Abdominal: Soft. Bowel sounds are normal. She exhibits no distension and no mass. There is no abdominal tenderness. There is no rebound and no guarding.  Musculoskeletal: Normal range of motion.        General: No tenderness or edema.  Neurological: She is alert and oriented to person, place, and time.  Skin: Skin is warm.  Psychiatric: Affect normal.     LABORATORY DATA:  I have reviewed the data as listed Lab Results  Component Value Date   WBC 4.8 01/16/2019   HGB 12.6 01/16/2019   HCT 36.9 01/16/2019   MCV 86.8 01/16/2019   PLT 240 01/16/2019   Recent Labs    11/03/18 0853 01/16/19 1306  NA 141 140  K 4.2 3.8  CL 105 105  CO2 28 27  GLUCOSE 94 98  BUN 13 14  CREATININE 0.85 0.88  CALCIUM 9.3 9.3  GFRNONAA 77 >60  GFRAA 89 >60  PROT 6.6 7.3  ALBUMIN  --  4.2  AST 15 16  ALT 8 12  ALKPHOS  --  114  BILITOT 0.4 0.8     No results found.  Malabsorption of iron #Iron deficiency-malabsorption symptomatic hemoglobin 11.7/low end of normal.  Ferritin 7 saturation 13%. S/p IV ferrahem x2.  #Today Hb improved' iron studies/ferrtin- penidng. HOLD off ferrheam  # B12-question deficiency recommend sublingual B12.  Await B12 levels.  #Abdominal discomfort epigastric- improved; prilosec   #Ongoing fatigue-unclear etiology.  Question social stressors.  # DISPOSITION: # HOLD ferrhem infusion today. # follow up in 6 months- cbc/bmp/iron studies/ferritin- possible Ferrahem-Dr.B  Addendum: Patient B12 is low at 157; iron studies normal.  Would recommend B12 injections on a monthly basis-we will inform patient.  All questions  were answered. The patient knows to call the clinic with any problems, questions or concerns.    Earna Coder, MD 01/17/2019 8:53 AM

## 2019-01-16 NOTE — Assessment & Plan Note (Addendum)
#  Iron deficiency-malabsorption symptomatic hemoglobin 11.7/low end of normal.  Ferritin 7 saturation 13%. S/p IV ferrahem x2.  #Today Hb improved' iron studies/ferrtin- penidng. HOLD off ferrheam  # B12-question deficiency recommend sublingual B12.  Await B12 levels.  #Abdominal discomfort epigastric- improved; prilosec   #Ongoing fatigue-unclear etiology.  Question social stressors.  # DISPOSITION: # HOLD ferrhem infusion today. # follow up in 6 months- cbc/bmp/iron studies/ferritin- possible Ferrahem-Dr.B  Addendum: Patient B12 is low at 157; iron studies normal.  Would recommend B12 injections on a monthly basis-we will inform patient.

## 2019-01-16 NOTE — Progress Notes (Signed)
Patient here for follow up. Pt states that sometimes she does not urinate for hours, unsure if it is related to ozempic. Pt complains of feeling tired and groggy. States she feels dizzy at times.

## 2019-01-17 ENCOUNTER — Other Ambulatory Visit: Payer: Self-pay | Admitting: Internal Medicine

## 2019-01-17 NOTE — Progress Notes (Signed)
Patient B12 is low at 157; iron studies normal.  Would recommend B12 injections on a monthly basis.  Heather/Brooke- Please inform patient that her B12 levels continue to be low-I would recommend B12 injection on a monthly basis-which could explain her fatigue. Orders for b12 are in.   If pt is interested, Colette- please schedule- b12 monthly/ and also in 6 months when she see me- Thx

## 2019-01-19 NOTE — Progress Notes (Signed)
I left message for patient notifying her of these results and recommendations. I advised that patient that scheduling team would be contacting her to set up these appts, and she could call us back with any questions. Thanks!

## 2019-02-13 ENCOUNTER — Inpatient Hospital Stay: Payer: BLUE CROSS/BLUE SHIELD

## 2019-03-05 ENCOUNTER — Other Ambulatory Visit: Payer: Self-pay

## 2019-03-05 ENCOUNTER — Encounter: Payer: Self-pay | Admitting: Family Medicine

## 2019-03-05 ENCOUNTER — Ambulatory Visit (INDEPENDENT_AMBULATORY_CARE_PROVIDER_SITE_OTHER): Payer: BLUE CROSS/BLUE SHIELD | Admitting: Family Medicine

## 2019-03-05 DIAGNOSIS — R739 Hyperglycemia, unspecified: Secondary | ICD-10-CM | POA: Diagnosis not present

## 2019-03-05 DIAGNOSIS — E538 Deficiency of other specified B group vitamins: Secondary | ICD-10-CM

## 2019-03-05 DIAGNOSIS — J4520 Mild intermittent asthma, uncomplicated: Secondary | ICD-10-CM

## 2019-03-05 DIAGNOSIS — R7303 Prediabetes: Secondary | ICD-10-CM

## 2019-03-05 DIAGNOSIS — F5101 Primary insomnia: Secondary | ICD-10-CM

## 2019-03-05 DIAGNOSIS — I10 Essential (primary) hypertension: Secondary | ICD-10-CM

## 2019-03-05 DIAGNOSIS — E785 Hyperlipidemia, unspecified: Secondary | ICD-10-CM

## 2019-03-05 DIAGNOSIS — E559 Vitamin D deficiency, unspecified: Secondary | ICD-10-CM

## 2019-03-05 DIAGNOSIS — Z9884 Bariatric surgery status: Secondary | ICD-10-CM

## 2019-03-05 MED ORDER — HYDROCHLOROTHIAZIDE 25 MG PO TABS: 25.0000 mg | ORAL_TABLET | Freq: Every day | ORAL | 1 refills | Status: DC

## 2019-03-05 MED ORDER — B-12 1000 MCG SL SUBL: 1.0000 | SUBLINGUAL_TABLET | Freq: Every day | SUBLINGUAL | 5 refills | Status: DC

## 2019-03-05 MED ORDER — FLUTICASONE FUROATE-VILANTEROL 100-25 MCG/INH IN AEPB: 1.0000 | INHALATION_SPRAY | Freq: Every day | RESPIRATORY_TRACT | 0 refills | Status: DC

## 2019-03-05 MED ORDER — CYANOCOBALAMIN 1000 MCG/ML IJ SOLN
1000.0000 ug | Freq: Once | INTRAMUSCULAR | Status: AC
  Administered 2019-03-05: 1000 ug via INTRAMUSCULAR

## 2019-03-05 MED ORDER — SIMVASTATIN 40 MG PO TABS: 40.0000 mg | ORAL_TABLET | Freq: Every day | ORAL | 1 refills | Status: DC

## 2019-03-05 MED ORDER — VITAMIN D (ERGOCALCIFEROL) 1.25 MG (50000 UNIT) PO CAPS: 50000.0000 [IU] | ORAL_CAPSULE | ORAL | 1 refills | Status: DC

## 2019-03-05 MED ORDER — SEMAGLUTIDE (1 MG/DOSE) 2 MG/1.5ML ~~LOC~~ SOPN: 1.0000 mL | PEN_INJECTOR | SUBCUTANEOUS | 1 refills | Status: DC

## 2019-03-05 NOTE — Progress Notes (Signed)
Name: Savannah Manning   MRN: [id number]    DOB: [birth year]   Date:03/05/2019       Progress Note  Subjective  Chief Complaint  Chief Complaint  Patient presents with  . Hypertension  . Prediabetes    I connected with  Savannah Manning  on 03/05/19 at  7:40 AM EDT by a video enabled telemedicine application and verified that I am speaking with the correct person using two identifiers.  I discussed the limitations of evaluation and management by telemedicine and the availability of in person appointments. The patient expressed understanding and agreed to proceed. Staff also discussed with the patient that there may be a patient responsible charge related to this service. Patient Location: home Provider Location: [facility]   HPI  Obesity/prediabetes:She started Ozempic 04/2017 and has been doing well - had nausea at first, but this has improved. She has been obese since childhood. She has tried multiple diets in the past, she hadgastric bypass in 2005. Weight before surgery was 314 lbs, she went down to 164 lbs, but has been gradually gaining weight, gained about 100lbs back. She states her weight has gone up since she started to work from home since 2010.She had hersurgery done at [facility],and was denied for a revision in the summer 2018. She has failed Metformin, gained 9 lbs while on it since 12/2016.She denies polyphagia, polydipsia or polyuria. We added Ozempic and it has helped her with insulin resistance and weight loss. Last weight at hematologist back 01/2019 was 231 lbs. Seh has been walking outside. Appetite is controlled, she is following a modified keto diet   Iron deficiency anemia: she had bypass surgery in 2005 she is now seeing Dr. Donneta Manning again for iron infusion, last ferritin and HCT back to normal, she only got on infusion, she states she is still feeling very tired, explained likely from B12 deficiency  GERD: she is doing well now, she is only  taking omeprazole prn now.   Hyperlipidemia: taking simvastatin,Last LDL was 120. She is on statin therapy and denies myalgias  The 10-year ASCVD risk score Savannah Manning., et al., 2013) is: 4.1%   Values used to calculate the score:     Age: 58 years     Sex: Female     Is Non-Hispanic African American: Yes     Diabetic: No     Tobacco smoker: No     Systolic Blood Pressure: 130 mmHg     Is BP treated: Yes     HDL Cholesterol: 76 mg/dL     Total Cholesterol: 217 mg/dL  HTN:. She denies chest pain; or palpitation, she takes HCTZ  . Last bp was checked at hematologist at it was at goal 130/85.   B12 and vitamin D deficiency:She is on MVI, taking Vitamin D otc and Vitamin B12 sublingual but B12 was still very low last month when checked by Dr. Donneta Manning. Discussed B12 injections in our office   Asthma mild intermittent: she is current not using Breo, just uses it prn. No wheezing or SOB at this time. She has a dry cough that is usually triggered by going outside , not daily   Patient Active Problem List   Diagnosis Date Noted  . Pre-diabetes 12/05/2018  . Hyperglycemia 12/05/2018  . Iron deficiency anemia 12/05/2018  . Malabsorption of iron 11/07/2018  . Chronic pain of right knee 07/26/2017  . Vitamin D deficiency 04/24/2017  . B12 deficiency 04/24/2017  . Abnormal mammogram  of right breast 01/01/2017  . Asthma, mild persistent 12/17/2016  . History of shoulder surgery 10/12/2016  . Primary osteoarthritis of left shoulder 06/14/2016  . Incomplete tear of left rotator cuff 05/30/2016  . Rotator cuff tendinitis 05/30/2016  . History of bariatric surgery 05/04/2016  . Allergic rhinitis 05/12/2015  . Benign essential HTN 05/12/2015  . Anxiety and depression 05/12/2015  . Insomnia, persistent 05/12/2015  . Dyslipidemia 05/12/2015  . Edema extremities 05/12/2015  . Gastroesophageal reflux disease without esophagitis 05/12/2015  . Hypoglycemia 05/12/2015  . Eczema intertrigo  05/12/2015  . Migraine 05/12/2015  . Plantar fasciitis 05/12/2015  . Umbilical hernia without obstruction and without gangrene 05/12/2015  . Morbid obesity, unspecified obesity type (HCC) 02/13/2008    Past Surgical History:  Procedure Laterality Date  . ABDOMINAL HYSTERECTOMY  2012  . CHOLECYSTECTOMY    . DILATION AND CURETTAGE OF UTERUS    . GASTRIC BYPASS  2005  . INDUCED ABORTION N/A 1997   patient was about 22 weeks  . SHOULDER ARTHROSCOPY WITH OPEN ROTATOR CUFF REPAIR Left 06/14/2016   Procedure: SHOULDER ARTHROSCOPY WITH OPEN ROTATOR CUFF REPAIR, DECOMPRESSION, EXCISION LOOSE BODY, DEBRIDEMENT;  Surgeon: Savannah FlakeJohn J Poggi, MD;  Location: [facility] ORS;  Service: Orthopedics;  Laterality: Left;    Family History  Problem Relation Age of Onset  . Hypertension Mother   . Cancer Father        prostate  . Heart disease Brother   . Breast cancer Cousin 40       mat cousin    Social History   Socioeconomic History  . Marital status: Single    Spouse name: Not on file  . Number of children: 1  . Years of education: Not on file  . Highest education level: Not on file  Occupational History  . Not on file  Social Needs  . Financial resource strain: Not hard at all  . Food insecurity:    Worry: Never true    Inability: Never true  . Transportation needs:    Medical: No    Non-medical: No  Tobacco Use  . Smoking status: Never Smoker  . Smokeless tobacco: Never Used  Substance and Sexual Activity  . Alcohol use: No    Alcohol/week: 0.0 standard drinks  . Drug use: No  . Sexual activity: Not Currently  Lifestyle  . Physical activity:    Days per week: 0 days    Minutes per session: 0 min  . Stress: Not at all  Relationships  . Social connections:    Talks on phone: More than three times a week    Gets together: More than three times a week    Attends religious service: 1 to 4 times per year    Active member of club or organization: Not on file    Attends meetings of  clubs or organizations: Never    Relationship status: Never married  . Intimate partner violence:    Fear of current or ex partner: No    Emotionally abused: No    Physically abused: No    Forced sexual activity: No  Other Topics Concern  . Not on file  Social History Narrative   Desk job/works for insurance; no smoking; no alcohol; lives in [place].      Current Outpatient Medications:  .  Butalbital-APAP-Caffeine 50-300-40 MG CAPS, Take 1 capsule by mouth daily as needed (migraine). , Disp: , Rfl:  .  fluticasone (FLONASE) 50 MCG/ACT nasal spray, Place 2 sprays into  both nostrils daily., Disp: 16 g, Rfl: 0 .  fluticasone furoate-vilanterol (BREO ELLIPTA) 100-25 MCG/INH AEPB, Inhale 1 puff into the lungs daily. (Patient taking differently: Inhale 1 puff into the lungs as needed. ), Disp: 60 each, Rfl: 0 .  hydrochlorothiazide (HYDRODIURIL) 25 MG tablet, Take 1 tablet (25 mg total) by mouth daily., Disp: 30 tablet, Rfl: 1 .  Loratadine 10 MG CAPS, Take 1 capsule by mouth as needed., Disp: , Rfl:  .  omeprazole (PRILOSEC) 40 MG capsule, TAKE 1 CAPSULE(40 MG) BY MOUTH DAILY (Patient taking differently: TAKE 1 CAPSULE(40 MG) BY MOUTH DAILY PRN), Disp: 90 capsule, Rfl: 0 .  Semaglutide, 1 MG/DOSE, (OZEMPIC, 1 MG/DOSE,) 2 MG/1.5ML SOPN, Inject 1 mL into the skin once a week., Disp: 9 mL, Rfl: 1 .  simvastatin (ZOCOR) 40 MG tablet, Take 1 tablet (40 mg total) by mouth at bedtime., Disp: 90 tablet, Rfl: 3 .  traZODone (DESYREL) 50 MG tablet, Take 1 tablet (50 mg total) by mouth at bedtime. (Patient taking differently: Take 50 mg by mouth as needed. ), Disp: 90 tablet, Rfl: 1 .  Vitamin D, Ergocalciferol, (DRISDOL) 1.25 MG (50000 UT) CAPS capsule, Take 1 capsule (50,000 Units total) by mouth every 7 (seven) days., Disp: 10 capsule, Rfl: 0  Allergies  Allergen Reactions  . Lisinopril Swelling    Facial Swelling  . Ace Inhibitors     angioedema  . Codeine Swelling  . Valsartan Swelling    I  personally reviewed active problem list, medication list, allergies, family history, social history with the patient/caregiver today.   ROS  Ten systems reviewed and is negative except as mentioned in HPI   Objective  Virtual encounter, vitals not obtained.  There is no height or weight on file to calculate BMI.  Physical Exam  Awake, alert and oriented   PHQ2/9: Depression screen [facility] 2/9 03/05/2019 12/05/2018 11/03/2018 04/24/2017 05/04/2016  Decreased Interest 0 0 0 0 0  Down, Depressed, Hopeless 0 0 0 0 0  PHQ - 2 Score 0 0 0 0 0  Altered sleeping 0 0 0 - -  Tired, decreased energy 0 0 0 - -  Change in appetite 0 0 0 - -  Feeling bad or failure about yourself  0 0 0 - -  Trouble concentrating 0 0 0 - -  Moving slowly or fidgety/restless 0 0 0 - -  Suicidal thoughts 0 0 0 - -  PHQ-9 Score 0 0 0 - -  Difficult doing work/chores - Not difficult at all Not difficult at all - -   PHQ-2/9 Result is negative.    Fall Risk: Fall Risk  03/05/2019 12/05/2018 11/03/2018 11/06/2017 04/24/2017  Falls in the past year? 0 0 1 No No  Number falls in past yr: 0 0 0 - -  Injury with Fall? 0 0 0 - -  Follow up - Falls evaluation completed - - -     Assessment & Plan  1. Benign essential HTN  - hydrochlorothiazide (HYDRODIURIL) 25 MG tablet; Take 1 tablet (25 mg total) by mouth daily.  Dispense: 90 tablet; Refill: 1  2. Hyperglycemia  - Semaglutide, 1 MG/DOSE, (OZEMPIC, 1 MG/DOSE,) 2 MG/1.5ML SOPN; Inject 1 mL into the skin once a week.  Dispense: 9 mL; Refill: 1  3. Pre-diabetes  - Semaglutide, 1 MG/DOSE, (OZEMPIC, 1 MG/DOSE,) 2 MG/1.5ML SOPN; Inject 1 mL into the skin once a week.  Dispense: 9 mL; Refill: 1  4. Morbid obesity, unspecified obesity type (  HCC)  - Semaglutide, 1 MG/DOSE, (OZEMPIC, 1 MG/DOSE,) 2 MG/1.5ML SOPN; Inject 1 mL into the skin once a week.  Dispense: 9 mL; Refill: 1  5. Dyslipidemia  - simvastatin (ZOCOR) 40 MG tablet; Take 1 tablet (40 mg total) by mouth  at bedtime.  Dispense: 90 tablet; Refill: 3  6. Vitamin D deficiency  - Vitamin D, Ergocalciferol, (DRISDOL) 1.25 MG (50000 UT) CAPS capsule; Take 1 capsule (50,000 Units total) by mouth every 7 (seven) days.  Dispense: 12 capsule; Refill: 1  7. B12 deficiency  - Cyanocobalamin (B-12) 1000 MCG SUBL; Place 1 tablet under the tongue daily.  Dispense: 30 each; Refill: 5 - cyanocobalamin ((VITAMIN B-12)) injection 1,000 mcg  8. History of bariatric surgery   9. Primary insomnia   10. Mild intermittent asthma without complication  - fluticasone furoate-vilanterol (BREO ELLIPTA) 100-25 MCG/INH AEPB; Inhale 1 puff into the lungs daily.  Dispense: 60 each; Refill: 0  I discussed the assessment and treatment plan with the patient. The patient was provided an opportunity to ask questions and all were answered. The patient agreed with the plan and demonstrated an understanding of the instructions.  The patient was advised to call back or seek an in-person evaluation if the symptoms worsen or if the condition fails to improve as anticipated.  I provided 25  minutes of non-face-to-face time during this encounter.

## 2019-03-13 ENCOUNTER — Inpatient Hospital Stay: Payer: BLUE CROSS/BLUE SHIELD

## 2019-04-10 ENCOUNTER — Inpatient Hospital Stay: Payer: BC Managed Care – PPO

## 2019-04-13 ENCOUNTER — Other Ambulatory Visit: Payer: Self-pay

## 2019-04-13 ENCOUNTER — Inpatient Hospital Stay: Payer: BC Managed Care – PPO | Attending: Internal Medicine

## 2019-04-13 VITALS — BP 157/90 | HR 50 | Temp 96.1°F | Resp 18

## 2019-04-13 DIAGNOSIS — K909 Intestinal malabsorption, unspecified: Secondary | ICD-10-CM | POA: Diagnosis not present

## 2019-04-13 DIAGNOSIS — E538 Deficiency of other specified B group vitamins: Secondary | ICD-10-CM | POA: Diagnosis not present

## 2019-04-13 DIAGNOSIS — D508 Other iron deficiency anemias: Secondary | ICD-10-CM | POA: Diagnosis not present

## 2019-04-13 DIAGNOSIS — Z9884 Bariatric surgery status: Secondary | ICD-10-CM | POA: Diagnosis not present

## 2019-04-13 MED ORDER — CYANOCOBALAMIN 1000 MCG/ML IJ SOLN
1000.0000 ug | Freq: Once | INTRAMUSCULAR | Status: AC
  Administered 2019-04-13: 1000 ug via INTRAMUSCULAR

## 2019-04-13 NOTE — Patient Instructions (Signed)
Cyanocobalamin, Vitamin B12 injection What is this medicine? CYANOCOBALAMIN (sye an oh koe BAL a min) is a man made form of vitamin B12. Vitamin B12 is used in the growth of healthy blood cells, nerve cells, and proteins in the body. It also helps with the metabolism of fats and carbohydrates. This medicine is used to treat people who can not absorb vitamin B12. This medicine may be used for other purposes; ask your health care provider or pharmacist if you have questions. COMMON BRAND NAME(S): B-12 Compliance Kit, B-12 Injection Kit, Cyomin, LA-12, Nutri-Twelve, Physicians EZ Use B-12, Primabalt What should I tell my health care provider before I take this medicine? They need to know if you have any of these conditions: -kidney disease -Leber's disease -megaloblastic anemia -an unusual or allergic reaction to cyanocobalamin, cobalt, other medicines, foods, dyes, or preservatives -pregnant or trying to get pregnant -breast-feeding How should I use this medicine? This medicine is injected into a muscle or deeply under the skin. It is usually given by a health care professional in a clinic or doctor's office. However, your doctor may teach you how to inject yourself. Follow all instructions. Talk to your pediatrician regarding the use of this medicine in children. Special care may be needed. Overdosage: If you think you have taken too much of this medicine contact a poison control center or emergency room at once. NOTE: This medicine is only for you. Do not share this medicine with others. What if I miss a dose? If you are given your dose at a clinic or doctor's office, call to reschedule your appointment. If you give your own injections and you miss a dose, take it as soon as you can. If it is almost time for your next dose, take only that dose. Do not take double or extra doses. What may interact with this medicine? -colchicine -heavy alcohol intake This list may not describe all possible  interactions. Give your health care provider a list of all the medicines, herbs, non-prescription drugs, or dietary supplements you use. Also tell them if you smoke, drink alcohol, or use illegal drugs. Some items may interact with your medicine. What should I watch for while using this medicine? Visit your doctor or health care professional regularly. You may need blood work done while you are taking this medicine. You may need to follow a special diet. Talk to your doctor. Limit your alcohol intake and avoid smoking to get the best benefit. What side effects may I notice from receiving this medicine? Side effects that you should report to your doctor or health care professional as soon as possible: -allergic reactions like skin rash, itching or hives, swelling of the face, lips, or tongue -blue tint to skin -chest tightness, pain -difficulty breathing, wheezing -dizziness -red, swollen painful area on the leg Side effects that usually do not require medical attention (report to your doctor or health care professional if they continue or are bothersome): -diarrhea -headache This list may not describe all possible side effects. Call your doctor for medical advice about side effects. You may report side effects to FDA at 1-800-FDA-1088. Where should I keep my medicine? Keep out of the reach of children. Store at room temperature between 15 and 30 degrees C (59 and 85 degrees F). Protect from light. Throw away any unused medicine after the expiration date. NOTE: This sheet is a summary. It may not cover all possible information. If you have questions about this medicine, talk to your doctor, pharmacist, or   health care provider.  2019 Elsevier/Gold Standard (2008-02-02 22:10:20)  

## 2019-05-07 ENCOUNTER — Inpatient Hospital Stay: Payer: BC Managed Care – PPO | Attending: Internal Medicine

## 2019-06-11 ENCOUNTER — Inpatient Hospital Stay: Payer: BC Managed Care – PPO | Attending: Internal Medicine

## 2019-06-11 ENCOUNTER — Other Ambulatory Visit: Payer: Self-pay

## 2019-06-11 DIAGNOSIS — E538 Deficiency of other specified B group vitamins: Secondary | ICD-10-CM | POA: Diagnosis not present

## 2019-06-11 DIAGNOSIS — K909 Intestinal malabsorption, unspecified: Secondary | ICD-10-CM

## 2019-06-11 MED ORDER — CYANOCOBALAMIN 1000 MCG/ML IJ SOLN
1000.0000 ug | Freq: Once | INTRAMUSCULAR | Status: AC
  Administered 2019-06-11: 1000 ug via INTRAMUSCULAR

## 2019-06-19 ENCOUNTER — Other Ambulatory Visit: Payer: Self-pay

## 2019-06-19 ENCOUNTER — Ambulatory Visit
Admission: EM | Admit: 2019-06-19 | Discharge: 2019-06-19 | Disposition: A | Discharge status: Home / Self Care | Payer: BC Managed Care – PPO | Attending: Family Medicine | Admitting: Family Medicine

## 2019-06-19 ENCOUNTER — Ambulatory Visit: Payer: Self-pay | Admitting: Family Medicine

## 2019-06-19 DIAGNOSIS — R0789 Other chest pain: Secondary | ICD-10-CM

## 2019-06-19 DIAGNOSIS — M549 Dorsalgia, unspecified: Secondary | ICD-10-CM | POA: Insufficient documentation

## 2019-06-19 MED ORDER — CYCLOBENZAPRINE HCL 10 MG PO TABS: 10.0000 mg | ORAL_TABLET | Freq: Every day | ORAL | 0 refills | Status: DC

## 2019-06-19 NOTE — ED Triage Notes (Signed)
Patient complains of left sided chest pain and back pain since Wednesday. Patient states that she thinks it may be a pulled muscle but just wanted to make sure and have an EKG done.

## 2019-06-19 NOTE — Telephone Encounter (Signed)
Pt. Reports she started having chest pain, "pulling sensation", since Wednesday. "Hurts when I move my shoulder." States she has seen cardiology in the past and has had stress tests. Has a family history of heart disease. No openings in the practice per [place]. Pt. Will go to ED for evaluation.  Answer Assessment - Initial Assessment Questions 1. LOCATION: "Where does it hurt?"       Left side 2. RADIATION: "Does the pain go anywhere else?" (e.g., into neck, jaw, arms, back)     No 3. ONSET: "When did the chest pain begin?" (Minutes, hours or days)      Wednesday 4. PATTERN "Does the pain come and go, or has it been constant since it started?"  "Does it get worse with exertion?"      Hurts when she moves her shoulder 5. DURATION: "How long does it last" (e.g., seconds, minutes, hours)     Constant 6. SEVERITY: "How bad is the pain?"  (e.g., Scale 1-10; mild, moderate, or severe)    - MILD (1-3): doesn't interfere with normal activities     - MODERATE (4-7): interferes with normal activities or awakens from sleep    - SEVERE (8-10): excruciating pain, unable to do any normal activities         4-5 7. CARDIAC RISK FACTORS: "Do you have any history of heart problems or risk factors for heart disease?" (e.g., angina, prior heart attack; diabetes, high blood pressure, high cholesterol, smoker, or strong family history of heart disease)     Has had stress tests in the past, HTN 8. PULMONARY RISK FACTORS: "Do you have any history of lung disease?"  (e.g., blood clots in lung, asthma, emphysema, birth control pills)     Asthma - seasonal 9. CAUSE: "What do you think is causing the chest pain?"     Unsure 10. OTHER SYMPTOMS: "Do you have any other symptoms?" (e.g., dizziness, nausea, vomiting, sweating, fever, difficulty breathing, cough)       No 11. PREGNANCY: "Is there any chance you are pregnant?" "When was your last menstrual period?"       No  Protocols used: CHEST PAIN-A-AH

## 2019-06-19 NOTE — ED Provider Notes (Signed)
[facility]    CSN: [id number] Arrival date & time: 06/19/19  1003     History   Chief Complaint Chief Complaint  Patient presents with  . Chest Pain    HPI Savannah Manning is a 58 y.o. female.   58 yo female with a c/o left sided chest wall pain and left upper back pain for the past 2 days. States it "feels like a pulled muscle". Pain varies but is worse with movement. Denies any left arm pain, jaw pain, neck pain, palpitations, syncope or shortness of breath.      Past Medical History:  Diagnosis Date  . Anemia   . Arthritis   . Asthma   . GERD (gastroesophageal reflux disease)   . Headache   . Hypertension     Patient Active Problem List   Diagnosis Date Noted  . Pre-diabetes 12/05/2018  . Hyperglycemia 12/05/2018  . Iron deficiency anemia 12/05/2018  . Malabsorption of iron 11/07/2018  . Chronic pain of right knee 07/26/2017  . Vitamin D deficiency 04/24/2017  . B12 deficiency 04/24/2017  . Abnormal mammogram of right breast 01/01/2017  . Asthma, mild persistent 12/17/2016  . History of shoulder surgery 10/12/2016  . Primary osteoarthritis of left shoulder 06/14/2016  . Incomplete tear of left rotator cuff 05/30/2016  . Rotator cuff tendinitis 05/30/2016  . History of bariatric surgery 05/04/2016  . Allergic rhinitis 05/12/2015  . Benign essential HTN 05/12/2015  . Anxiety and depression 05/12/2015  . Insomnia, persistent 05/12/2015  . Dyslipidemia 05/12/2015  . Edema extremities 05/12/2015  . Gastroesophageal reflux disease without esophagitis 05/12/2015  . Hypoglycemia 05/12/2015  . Eczema intertrigo 05/12/2015  . Migraine 05/12/2015  . Plantar fasciitis 05/12/2015  . Umbilical hernia without obstruction and without gangrene 05/12/2015  . Morbid obesity, unspecified obesity type ([place]) 02/13/2008    Past Surgical History:  Procedure Laterality Date  . ABDOMINAL HYSTERECTOMY  2012  . CHOLECYSTECTOMY    . DILATION AND CURETTAGE OF  UTERUS    . GASTRIC BYPASS  2005  . INDUCED ABORTION N/A 1997   patient was about 22 weeks  . SHOULDER ARTHROSCOPY WITH OPEN ROTATOR CUFF REPAIR Left 06/14/2016   Procedure: SHOULDER ARTHROSCOPY WITH OPEN ROTATOR CUFF REPAIR, DECOMPRESSION, EXCISION LOOSE BODY, DEBRIDEMENT;  Surgeon: Corky Mull, MD;  Location: [facility] ORS;  Service: Orthopedics;  Laterality: Left;    OB History    Gravida  2   Para  0   Term  0   Preterm  0   AB  1   Living  1     SAB  1   TAB  0   Ectopic  0   Multiple  0   Live Births  1            Home Medications    Prior to Admission medications   Medication Sig Start Date End Date Taking? Authorizing Provider  Cyanocobalamin (B-12) 1000 MCG SUBL Place 1 tablet under the tongue daily. 03/05/19  Yes Sowles, Drue Stager, MD  fluticasone (FLONASE) 50 MCG/ACT nasal spray Place 2 sprays into both nostrils daily. 11/03/18  Yes [name], FNP  fluticasone furoate-vilanterol (BREO ELLIPTA) 100-25 MCG/INH AEPB Inhale 1 puff into the lungs daily. 03/05/19  Yes Sowles, Drue Stager, MD  hydrochlorothiazide (HYDRODIURIL) 25 MG tablet Take 1 tablet (25 mg total) by mouth daily. 03/05/19  Yes Sowles, Drue Stager, MD  Loratadine 10 MG CAPS Take 1 capsule by mouth as needed.   Yes [provider]  omeprazole (PRILOSEC) 40 MG capsule TAKE 1 CAPSULE(40 MG) BY MOUTH DAILY Patient taking differently: TAKE 1 CAPSULE(40 MG) BY MOUTH DAILY PRN 12/05/18  Yes Doren CustardBoyce, Emily E, FNP  Semaglutide, 1 MG/DOSE, (OZEMPIC, 1 MG/DOSE,) 2 MG/1.5ML SOPN Inject 1 mL into the skin once a week. 03/05/19  Yes Sowles, Danna HeftyKrichna, MD  simvastatin (ZOCOR) 40 MG tablet Take 1 tablet (40 mg total) by mouth at bedtime. 03/05/19  Yes Sowles, Danna HeftyKrichna, MD  traZODone (DESYREL) 50 MG tablet Take 1 tablet (50 mg total) by mouth at bedtime. Patient taking differently: Take 50 mg by mouth as needed.  05/13/15  Yes Sowles, Danna HeftyKrichna, MD  Vitamin D, Ergocalciferol, (DRISDOL) 1.25 MG (50000 UT) CAPS capsule Take 1  capsule (50,000 Units total) by mouth every 7 (seven) days. 03/05/19  Yes Sowles, Danna HeftyKrichna, MD  cyclobenzaprine (FLEXERIL) 10 MG tablet Take 1 tablet (10 mg total) by mouth at bedtime. 06/19/19   [name], Nocole Zammit, MD    Family History Family History  Problem Relation Age of Onset  . Hypertension Mother   . Cancer Father        prostate  . Heart disease Brother   . Breast cancer Cousin 40       mat cousin    Social History Social History   Tobacco Use  . Smoking status: Never Smoker  . Smokeless tobacco: Never Used  Substance Use Topics  . Alcohol use: No    Alcohol/week: 0.0 standard drinks  . Drug use: No     Allergies   Lisinopril, Ace inhibitors, Codeine, and Valsartan   Review of Systems Review of Systems   Physical Exam Triage Vital Signs ED Triage Vitals  Enc Vitals Group     BP 06/19/19 1015 (!) 145/96     Pulse Rate 06/19/19 1015 63     Resp 06/19/19 1015 18     Temp 06/19/19 1015 98.1 F (36.7 C)     Temp Source 06/19/19 1015 Oral     SpO2 06/19/19 1015 99 %     Weight 06/19/19 1013 235 lb (106.6 kg)     Height 06/19/19 1013 5\' 7"  (1.702 m)     Head Circumference --      Peak Flow --      Pain Score 06/19/19 1013 4     Pain Loc --      Pain Edu? --      Excl. in GC? --    No data found.  Updated Vital Signs BP (!) 145/96 (BP Location: Right Arm)   Pulse 63   Temp 98.1 F (36.7 C) (Oral)   Resp 18   Ht 5\' 7"  (1.702 m)   Wt 106.6 kg   SpO2 99%   BMI 36.81 kg/m   Visual Acuity Right Eye Distance:   Left Eye Distance:   Bilateral Distance:    Right Eye Near:   Left Eye Near:    Bilateral Near:     Physical Exam Vitals signs and nursing note reviewed.  Constitutional:      General: She is not in acute distress.    Appearance: She is not toxic-appearing or diaphoretic.  Neck:     Musculoskeletal: Neck supple.  Cardiovascular:     Rate and Rhythm: Regular rhythm. Bradycardia present.     Pulses: Normal pulses.     Heart sounds:  Normal heart sounds.  Pulmonary:     Effort: Pulmonary effort is normal. No respiratory distress.     Breath sounds:  Normal breath sounds. No stridor. No wheezing, rhonchi or rales.  Chest:     Chest wall: Tenderness (reproducible of pain; left upper) present.  Musculoskeletal:     Cervical back: She exhibits tenderness (left trapezius) and spasm.  Neurological:     Mental Status: She is alert.      UC Treatments / Results  Labs (all labs ordered are listed, but only abnormal results are displayed) Labs Reviewed - No data to display  EKG   Radiology No results found.  Procedures ED EKG  Date/Time: 06/19/2019 2:19 PM Performed by: [name], Markela Wee, MD Authorized by: [name], Shaylon Gillean, MD   ECG reviewed by ED Physician in the absence of a cardiologist: yes   Previous ECG:    Previous ECG:  Compared to current   Similarity:  No change Interpretation:    Interpretation: normal   Rate:    ECG rate:  59   ECG rate assessment: normal   Rhythm:    Rhythm: sinus bradycardia   Ectopy:    Ectopy: none   QRS:    QRS axis:  Normal   QRS intervals:  Normal Conduction:    Conduction: normal   ST segments:    ST segments:  Normal T waves:    T waves: normal     (including critical care time)  Medications Ordered in UC Medications - No data to display  Initial Impression / Assessment and Plan / UC Course  I have reviewed the triage vital signs and the nursing notes.  Pertinent labs & imaging results that were available during my care of the patient were reviewed by me and considered in my medical decision making (see chart for details).      Final Clinical Impressions(s) / UC Diagnoses   Final diagnoses:  Chest wall pain  Upper back pain on left side     Discharge Instructions     Rest, ice/heat, tylenol Follow up as needed if symptoms worsen or new symptoms develop    ED Prescriptions    Medication Sig Dispense Auth. Provider   cyclobenzaprine (FLEXERIL)  10 MG tablet Take 1 tablet (10 mg total) by mouth at bedtime. 30 tablet Jency Schnieders, Pamala Hurryrlando, MD     1. ekg results and diagnosis reviewed with patient 2. rx as per orders above; reviewed possible side effects, interactions, risks and benefits  3. Recommend supportive treatment as above 4. Follow-up prn if symptoms worsen or don't improve   Controlled Substance Prescriptions [place] Controlled Substance Registry consulted? Not Applicable   [name], Ave Scharnhorst, MD 06/19/19 1423

## 2019-06-19 NOTE — Discharge Instructions (Signed)
Rest, ice/heat, tylenol Follow up as needed if symptoms worsen or new symptoms develop

## 2019-06-25 ENCOUNTER — Telehealth: Payer: Self-pay | Admitting: Family Medicine

## 2019-06-25 NOTE — Telephone Encounter (Signed)
Copied from [place] [id number]. Topic: General - Other >> Jun 25, 2019 10:14 AM Keene Breath wrote: Reason for CRM: Patient would like for the nurse to call her regarding a referral to a cardiologist.  She stated she went to an urgent care and they did a EKG which was normal, but she is still experiencing pain in her chest.  Please call patient to advise.  CB# [phone]

## 2019-06-25 NOTE — Telephone Encounter (Signed)
Patient wanted to know if she needed to see you in order to get a referral to cardiology. I told her to call her insurance and check to see if she needs to be seen first or not.

## 2019-06-25 NOTE — Telephone Encounter (Signed)
Pt is coming in tomorrow for the appt. I did advise her that if it gets worse to go to urgent care or ER. Pt verbalized understanding

## 2019-06-26 ENCOUNTER — Ambulatory Visit (INDEPENDENT_AMBULATORY_CARE_PROVIDER_SITE_OTHER): Payer: BC Managed Care – PPO | Admitting: Family Medicine

## 2019-06-26 ENCOUNTER — Encounter: Payer: Self-pay | Admitting: Family Medicine

## 2019-06-26 ENCOUNTER — Other Ambulatory Visit: Payer: Self-pay

## 2019-06-26 VITALS — BP 138/82 | HR 65 | Temp 97.1°F | Resp 16 | Ht 67.0 in | Wt 239.2 lb

## 2019-06-26 DIAGNOSIS — K219 Gastro-esophageal reflux disease without esophagitis: Secondary | ICD-10-CM

## 2019-06-26 DIAGNOSIS — Z9884 Bariatric surgery status: Secondary | ICD-10-CM

## 2019-06-26 DIAGNOSIS — I10 Essential (primary) hypertension: Secondary | ICD-10-CM

## 2019-06-26 DIAGNOSIS — E785 Hyperlipidemia, unspecified: Secondary | ICD-10-CM

## 2019-06-26 DIAGNOSIS — R739 Hyperglycemia, unspecified: Secondary | ICD-10-CM

## 2019-06-26 DIAGNOSIS — R0789 Other chest pain: Secondary | ICD-10-CM

## 2019-06-26 DIAGNOSIS — E559 Vitamin D deficiency, unspecified: Secondary | ICD-10-CM

## 2019-06-26 DIAGNOSIS — R7303 Prediabetes: Secondary | ICD-10-CM | POA: Diagnosis not present

## 2019-06-26 MED ORDER — OZEMPIC (1 MG/DOSE) 2 MG/1.5ML ~~LOC~~ SOPN: 1.0000 mL | PEN_INJECTOR | SUBCUTANEOUS | 1 refills | Status: DC

## 2019-06-26 MED ORDER — SIMVASTATIN 40 MG PO TABS: 40.0000 mg | ORAL_TABLET | Freq: Every day | ORAL | 1 refills | Status: DC

## 2019-06-26 MED ORDER — MELOXICAM 15 MG PO TABS: 15.0000 mg | ORAL_TABLET | Freq: Every day | ORAL | 0 refills | Status: DC

## 2019-06-26 MED ORDER — VITAMIN D (ERGOCALCIFEROL) 1.25 MG (50000 UNIT) PO CAPS: 50000.0000 [IU] | ORAL_CAPSULE | ORAL | 1 refills | Status: DC

## 2019-06-26 MED ORDER — HYDROCHLOROTHIAZIDE 25 MG PO TABS: 25.0000 mg | ORAL_TABLET | Freq: Every day | ORAL | 1 refills | Status: DC

## 2019-06-26 NOTE — Progress Notes (Signed)
Name: Savannah Manning   MRN: [id number]    DOB: 1961-07-02   Date:06/26/2019       Progress Note  Subjective  Chief Complaint  Chief Complaint  Patient presents with  . Referral    She would like a referral to cardiologist.  . Chest Pain    HPI  Chest pain:  On 06/18/2019 she developed left side chest pain, that radiated to her back, worse with movement of her left shoulder, but no radiation to arm, neck , no SOB, palpitation, diaphoresis with episodes of chest pain. No rashes. No fever, chills or cough. She denies any trauma or change in activity. She went to urgent care on 08/14 had normal EKG except for sinus bradycardia, was given muscle relaxer for possible chest wall pain, she woke up feeling well the following day. She states she only took medication twice, and stopped since she was feeling better, however over the past 2 days she noticed recurrence of the pain, but no longer constant, she did not try going back on flexeril yet. She is very worried about having heart disease since she has a significant family history of heart disease ( 4 sibling have a history of heart disease and oldest brother died age 79  from massive MI) , she has seen Dr. Saralyn Pilar in the past and had stress test negative  Obesity/prediabetes:. She has been obese since childhood. She has tried multiple diets in the past, she hadgastric bypass in 2005. Weight before surgery was 314 lbs, she went down to 164 lbs, but has been gradually gaining weight, gained about 100lbs back. She states her weight has gone up since she started to work from home since 2010.She had [facility] done at [facility] was denied for a revision in the summer 2018. She has failed Metformin, gained 9 lbs from 12/2016 till 04/2017 and we started her on Ozempic 04/2017 at weight of 259 lbs, her weight is down to 239 lbs , she pre-diabetes , she has not been physically active, more sedentary since COVID-19 ( explained she can walk outside), she is  still drinking sweet tea, she has changed to brown rice, and wheat bread, discussed trying to eliminate starches.    GERD: she is doing well now, taking PPI daily and discussed long term ppi use   HTN: taking hctz, denies side effects of medication, no sob, but has noticed some chest wall pain  Vitamin D deficiency: continue supplements    Patient Active Problem List   Diagnosis Date Noted  . Pre-diabetes 12/05/2018  . Hyperglycemia 12/05/2018  . Iron deficiency anemia 12/05/2018  . Malabsorption of iron 11/07/2018  . Chronic pain of right knee 07/26/2017  . Vitamin D deficiency 04/24/2017  . B12 deficiency 04/24/2017  . Abnormal mammogram of right breast 01/01/2017  . Asthma, mild persistent 12/17/2016  . History of shoulder surgery 10/12/2016  . Primary osteoarthritis of left shoulder 06/14/2016  . Incomplete tear of left rotator cuff 05/30/2016  . Rotator cuff tendinitis 05/30/2016  . History of bariatric surgery 05/04/2016  . Allergic rhinitis 05/12/2015  . Benign essential HTN 05/12/2015  . Anxiety and depression 05/12/2015  . Insomnia, persistent 05/12/2015  . Dyslipidemia 05/12/2015  . Edema extremities 05/12/2015  . Gastroesophageal reflux disease without esophagitis 05/12/2015  . Hypoglycemia 05/12/2015  . Eczema intertrigo 05/12/2015  . Migraine 05/12/2015  . Plantar fasciitis 05/12/2015  . Umbilical hernia without obstruction and without gangrene 05/12/2015  . Morbid obesity, unspecified obesity type ([place]) 02/13/2008  Past Surgical History:  Procedure Laterality Date  . ABDOMINAL HYSTERECTOMY  2012  . CHOLECYSTECTOMY    . DILATION AND CURETTAGE OF UTERUS    . GASTRIC BYPASS  2005  . INDUCED ABORTION N/A 1997   patient was about 22 weeks  . SHOULDER ARTHROSCOPY WITH OPEN ROTATOR CUFF REPAIR Left 06/14/2016   Procedure: SHOULDER ARTHROSCOPY WITH OPEN ROTATOR CUFF REPAIR, DECOMPRESSION, EXCISION LOOSE BODY, DEBRIDEMENT;  Surgeon: Christena FlakeJohn J Poggi, MD;   Location: [facility] OR[facility];  Service: Orthopedics;  Laterality: Left;    Family History  Problem Relation Age of Onset  . Hypertension Mother   . Cancer Father        prostate  . Heart disease Brother   . Breast cancer Cousin 40       mat cousin    Social History   Socioeconomic History  . Marital status: Single    Spouse name: Not on file  . Number of children: 1  . Years of education: Not on file  . Highest education level: Not on file  Occupational History  . Not on file  Social Needs  . Financial resource strain: Not hard at all  . Food insecurity    Worry: Never true    Inability: Never true  . Transportation needs    Medical: No    Non-medical: No  Tobacco Use  . Smoking status: Never Smoker  . Smokeless tobacco: Never Used  Substance and Sexual Activity  . Alcohol use: No    Alcohol/week: 0.0 standard drinks  . Drug use: No  . Sexual activity: Not Currently  Lifestyle  . Physical activity    Days per week: 0 days    Minutes per session: 0 min  . Stress: Not at all  Relationships  . Social connections    Talks on phone: More than three times a week    Gets together: More than three times a week    Attends religious service: 1 to 4 times per year    Active member of club or organization: Not on file    Attends meetings of clubs or organizations: Never    Relationship status: Never married  . Intimate partner violence    Fear of current or ex partner: No    Emotionally abused: No    Physically abused: No    Forced sexual activity: No  Other Topics Concern  . Not on file  Social History Narrative   Desk job/works for insurance; no smoking; no alcohol; lives in [place].      Current Outpatient Medications:  .  Cyanocobalamin (B-12) 1000 MCG SUBL, Place 1 tablet under the tongue daily., Disp: 30 each, Rfl: 5 .  cyclobenzaprine (FLEXERIL) 10 MG tablet, Take 1 tablet (10 mg total) by mouth at bedtime., Disp: 30 tablet, Rfl: 0 .  fluticasone (FLONASE) 50  MCG/ACT nasal spray, Place 2 sprays into both nostrils daily., Disp: 16 g, Rfl: 0 .  fluticasone furoate-vilanterol (BREO ELLIPTA) 100-25 MCG/INH AEPB, Inhale 1 puff into the lungs daily., Disp: 60 each, Rfl: 0 .  hydrochlorothiazide (HYDRODIURIL) 25 MG tablet, Take 1 tablet (25 mg total) by mouth daily., Disp: 90 tablet, Rfl: 1 .  Loratadine 10 MG CAPS, Take 1 capsule by mouth as needed., Disp: , Rfl:  .  omeprazole (PRILOSEC) 40 MG capsule, TAKE 1 CAPSULE(40 MG) BY MOUTH DAILY (Patient taking differently: TAKE 1 CAPSULE(40 MG) BY MOUTH DAILY PRN), Disp: 90 capsule, Rfl: 0 .  Semaglutide, 1 MG/DOSE, (OZEMPIC, 1 MG/DOSE,)  2 MG/1.5ML SOPN, Inject 1 mL into the skin once a week., Disp: 9 mL, Rfl: 1 .  simvastatin (ZOCOR) 40 MG tablet, Take 1 tablet (40 mg total) by mouth at bedtime., Disp: 90 tablet, Rfl: 1 .  traZODone (DESYREL) 50 MG tablet, Take 1 tablet (50 mg total) by mouth at bedtime. (Patient taking differently: Take 50 mg by mouth as needed. ), Disp: 90 tablet, Rfl: 1 .  Vitamin D, Ergocalciferol, (DRISDOL) 1.25 MG (50000 UT) CAPS capsule, Take 1 capsule (50,000 Units total) by mouth every 7 (seven) days., Disp: 12 capsule, Rfl: 1  Allergies  Allergen Reactions  . Lisinopril Swelling    Facial Swelling  . Ace Inhibitors     angioedema  . Codeine Swelling  . Valsartan Swelling    I personally reviewed active problem list, medication list, allergies, family history, social history with the patient/caregiver today.   ROS  Ten systems reviewed and is negative except as mentioned in HPI   Objective  Vitals:   06/26/19 1450  BP: 138/82  Pulse: 65  Resp: 16  Temp: (!) 97.1 F (36.2 C)  TempSrc: Temporal  SpO2: 95%  Weight: 239 lb 3.2 oz (108.5 kg)  Height: 5\' 7"  (1.702 m)    Body mass index is 37.46 kg/m.  Physical Exam  Constitutional: Patient appears well-developed and well-nourished. Obese  No distress.  HEENT: head atraumatic, normocephalic, pupils equal and  reactive to light,  neck supple Cardiovascular: Normal rate, regular rhythm and normal heart sounds.  No murmur heard. No BLE edema. Pulmonary/Chest: Effort normal and breath sounds normal. No respiratory distress. Abdominal: Soft.  There is no tenderness. Psychiatric: Patient has a normal mood and affect. behavior is normal. Judgment and thought content normal.  PHQ2/9: Depression screen [facility] 2/9 06/26/2019 03/05/2019 12/05/2018 11/03/2018 04/24/2017  Decreased Interest 0 0 0 0 0  Down, Depressed, Hopeless 0 0 0 0 0  PHQ - 2 Score 0 0 0 0 0  Altered sleeping 0 0 0 0 -  Tired, decreased energy 0 0 0 0 -  Change in appetite 0 0 0 0 -  Feeling bad or failure about yourself  0 0 0 0 -  Trouble concentrating 0 0 0 0 -  Moving slowly or fidgety/restless 0 0 0 0 -  Suicidal thoughts 0 0 0 0 -  PHQ-9 Score 0 0 0 0 -  Difficult doing work/chores - - Not difficult at all Not difficult at all -    phq 9 is negative   Fall Risk: Fall Risk  06/26/2019 03/05/2019 12/05/2018 11/03/2018 11/06/2017  Falls in the past year? 0 0 0 1 No  Number falls in past yr: 0 0 0 0 -  Injury with Fall? 1 0 0 0 -  Follow up - - Falls evaluation completed - -      Assessment & Plan  1. Pain, chest wall  Explained no need to see cardiologist at this time, reproducible pain on her scapular area and also anterior chest, try nsaid's and also flexeril for one week  - meloxicam (MOBIC) 15 MG tablet; Take 1 tablet (15 mg total) by mouth daily.  Dispense: 30 tablet; Refill: 0  2. Gastro-esophageal reflux disease without esophagitis  Taking medication   3. Pre-diabetes  - Semaglutide, 1 MG/DOSE, (OZEMPIC, 1 MG/DOSE,) 2 MG/1.5ML SOPN; Inject 1 mL into the skin once a week.  Dispense: 9 mL; Refill: 1  4. Morbid obesity, unspecified obesity type ([facility])  - Semaglutide, 1  MG/DOSE, (OZEMPIC, 1 MG/DOSE,) 2 MG/1.5ML SOPN; Inject 1 mL into the skin once a week.  Dispense: 9 mL; Refill: 1  5. History of bariatric  surgery   6. Benign essential HTN  - hydrochlorothiazide (HYDRODIURIL) 25 MG tablet; Take 1 tablet (25 mg total) by mouth daily.  Dispense: 90 tablet; Refill: 1  7. Hyperglycemia  - Semaglutide, 1 MG/DOSE, (OZEMPIC, 1 MG/DOSE,) 2 MG/1.5ML SOPN; Inject 1 mL into the skin once a week.  Dispense: 9 mL; Refill: 1  8. Dyslipidemia  - simvastatin (ZOCOR) 40 MG tablet; Take 1 tablet (40 mg total) by mouth at bedtime.  Dispense: 90 tablet; Refill: 1  9. Vitamin D deficiency  - Vitamin D, Ergocalciferol, (DRISDOL) 1.25 MG (50000 UT) CAPS capsule; Take 1 capsule (50,000 Units total) by mouth every 7 (seven) days.  Dispense: 12 capsule; Refill: 1

## 2019-07-06 ENCOUNTER — Ambulatory Visit: Payer: BLUE CROSS/BLUE SHIELD | Admitting: Family Medicine

## 2019-07-15 ENCOUNTER — Other Ambulatory Visit: Payer: Self-pay

## 2019-07-15 DIAGNOSIS — D509 Iron deficiency anemia, unspecified: Secondary | ICD-10-CM

## 2019-07-24 ENCOUNTER — Other Ambulatory Visit: Payer: Self-pay

## 2019-07-24 ENCOUNTER — Inpatient Hospital Stay: Payer: BC Managed Care – PPO | Attending: Hematology and Oncology

## 2019-07-24 ENCOUNTER — Other Ambulatory Visit: Payer: BLUE CROSS/BLUE SHIELD

## 2019-07-24 ENCOUNTER — Ambulatory Visit: Payer: BLUE CROSS/BLUE SHIELD

## 2019-07-24 ENCOUNTER — Ambulatory Visit: Payer: BLUE CROSS/BLUE SHIELD | Admitting: Internal Medicine

## 2019-07-24 DIAGNOSIS — Z9884 Bariatric surgery status: Secondary | ICD-10-CM | POA: Diagnosis not present

## 2019-07-24 DIAGNOSIS — D508 Other iron deficiency anemias: Secondary | ICD-10-CM | POA: Insufficient documentation

## 2019-07-24 DIAGNOSIS — E538 Deficiency of other specified B group vitamins: Secondary | ICD-10-CM | POA: Insufficient documentation

## 2019-07-24 DIAGNOSIS — Z79899 Other long term (current) drug therapy: Secondary | ICD-10-CM | POA: Insufficient documentation

## 2019-07-24 DIAGNOSIS — Z90710 Acquired absence of both cervix and uterus: Secondary | ICD-10-CM | POA: Diagnosis not present

## 2019-07-24 DIAGNOSIS — K909 Intestinal malabsorption, unspecified: Secondary | ICD-10-CM | POA: Diagnosis not present

## 2019-07-24 DIAGNOSIS — I10 Essential (primary) hypertension: Secondary | ICD-10-CM | POA: Insufficient documentation

## 2019-07-24 DIAGNOSIS — D509 Iron deficiency anemia, unspecified: Secondary | ICD-10-CM

## 2019-07-24 LAB — COMPREHENSIVE METABOLIC PANEL
ALT: 14 U/L (ref 0–44)
AST: 17 U/L (ref 15–41)
Albumin: 3.9 g/dL (ref 3.5–5.0)
Alkaline Phosphatase: 98 U/L (ref 38–126)
Anion gap: 8 (ref 5–15)
BUN: 17 mg/dL (ref 6–20)
CO2: 26 mmol/L (ref 22–32)
Calcium: 8.9 mg/dL (ref 8.9–10.3)
Chloride: 103 mmol/L (ref 98–111)
Creatinine, Ser: 0.76 mg/dL (ref 0.44–1.00)
GFR calc Af Amer: >60 mL/min (ref >60)
GFR calc non Af Amer: >60 mL/min (ref >60)
Glucose, Bld: 100 mg/dL — ABNORMAL HIGH (ref 70–99)
Potassium: 3.9 mmol/L (ref 3.5–5.1)
Sodium: 137 mmol/L (ref 135–145)
Total Bilirubin: 0.9 mg/dL (ref 0.3–1.2)
Total Protein: 7 g/dL (ref 6.5–8.1)

## 2019-07-24 LAB — IRON AND TIBC
Iron: 117 ug/dL (ref 28–170)
Saturation Ratios: 29 % (ref 10.4–31.8)
TIBC: 409 ug/dL (ref 250–450)
UIBC: 292 ug/dL

## 2019-07-24 LAB — CBC WITH DIFFERENTIAL/PLATELET
Abs Immature Granulocytes: 0.01 K/uL (ref 0.00–0.07)
Basophils Absolute: 0 K/uL (ref 0.0–0.1)
Basophils Relative: 0 %
Eosinophils Absolute: 0.1 K/uL (ref 0.0–0.5)
Eosinophils Relative: 2 %
HCT: 37.4 % (ref 36.0–46.0)
Hemoglobin: 12.9 g/dL (ref 12.0–15.0)
Immature Granulocytes: 0 %
Lymphocytes Relative: 35 %
Lymphs Abs: 1.7 K/uL (ref 0.7–4.0)
MCH: 30.7 pg (ref 26.0–34.0)
MCHC: 34.5 g/dL (ref 30.0–36.0)
MCV: 89 fL (ref 80.0–100.0)
Monocytes Absolute: 0.4 K/uL (ref 0.1–1.0)
Monocytes Relative: 8 %
Neutro Abs: 2.7 K/uL (ref 1.7–7.7)
Neutrophils Relative %: 55 %
Platelets: 237 K/uL (ref 150–400)
RBC: 4.2 MIL/uL (ref 3.87–5.11)
RDW: 12.7 % (ref 11.5–15.5)
WBC: 4.9 K/uL (ref 4.0–10.5)
nRBC: 0 % (ref 0.0–0.2)

## 2019-07-24 LAB — FERRITIN: Ferritin: 70 ng/mL (ref 11–307)

## 2019-07-27 ENCOUNTER — Ambulatory Visit: Payer: Self-pay

## 2019-07-28 ENCOUNTER — Encounter: Payer: Self-pay | Admitting: Hematology and Oncology

## 2019-07-28 NOTE — Progress Notes (Signed)
No new changes noted. The patient Name and DOB has been verified by phone. 

## 2019-07-28 NOTE — Progress Notes (Signed)
[facility]  [address] [place], [place] [address] Phone: [phone]  Fax: [phone]   Clinic Day:  07/29/2019  Referring physician: Alba Cory, MD  Chief Complaint: Savannah Manning is a 58 y.o. female s/p gastric bypass surgery with iron deficiency anemia and B12 deficiency who is seen for new patient assessment.   HPI:  The patient underwent gastric bypass surgery in 2005 in [place].  She has previously received IV iron.  She denied any melena, hematochezia, hematuria or vaginal bleeding.  She is s/p hysterectomy in 2012.  The patient was initially seen in the hematology clinic on 11/07/2018.  She noted fatigue for several months and an episode of dizziness prior to Christmas.   Ferritin has been followed: 8 on 10/12/2016, 11 on 05/01/2017, 7 on 11/03/2018, 173 on 01/16/2019, and 70 on 07/24/2019.  She has received Feraheme: 11/11/2018 and 11/18/2018.  She was last seen in the hematology clinic by Dr. Donneta Romberg on 01/16/2019.  She did not notice significant improvement in the fatigue.  She was under a lot of social stress at home. She denied any worsening abdominal pain.  Labs on 07/24/2019 included a hematocrit 37.4, hemoglobin 12.9, MCV 89.0, platelets 237,000, and WBC 4,900.  Ferritin was 70 with an iron saturation of 29% and a TIBC 409.   She receives Vitamin B12 injections.  Her last injection was on 06/11/2019.  She missed some B12 injections during COVID-19.  Symptomatically, she has been feeling fatigued recently. She does not sleep well at night.  She feels tired in the morning.  She denies being tested for sleep apnea.  Last colonoscopy was on 08/10/2014 by Dr. Dow Adolph.   Past Medical History:  Diagnosis Date  . Anemia   . Arthritis   . Asthma   . GERD (gastroesophageal reflux disease)   . Headache   . Hypertension     Past Surgical History:  Procedure Laterality Date  . ABDOMINAL HYSTERECTOMY  2012  .  CHOLECYSTECTOMY    . DILATION AND CURETTAGE OF UTERUS    . GASTRIC BYPASS  2005  . INDUCED ABORTION N/A 1997   patient was about 22 weeks  . SHOULDER ARTHROSCOPY WITH OPEN ROTATOR CUFF REPAIR Left 06/14/2016   Procedure: SHOULDER ARTHROSCOPY WITH OPEN ROTATOR CUFF REPAIR, DECOMPRESSION, EXCISION LOOSE BODY, DEBRIDEMENT;  Surgeon: Christena Flake, MD;  Location: [facility] ORS;  Service: Orthopedics;  Laterality: Left;    Family History  Problem Relation Age of Onset  . Hypertension Mother   . Cancer Father        prostate  . Heart disease Brother   . Breast cancer Cousin 40       mat cousin    Social History:  reports that she has never smoked. She has never used smokeless tobacco. She reports that she does not drink alcohol or use drugs. 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  She lives in [place].  The patient is alone today.  Allergies:  Allergies  Allergen Reactions  . Lisinopril Swelling    Facial Swelling  . Ace Inhibitors     angioedema  . Codeine Swelling  . Valsartan Swelling    Current Medications: Current Outpatient Medications  Medication Sig Dispense Refill  . Cyanocobalamin (B-12) 1000 MCG SUBL Place 1 tablet under the tongue daily. 30 each 5  . hydrochlorothiazide (HYDRODIURIL) 25 MG tablet Take 1 tablet (25 mg total) by mouth daily. 90 tablet 1  . Loratadine 10 MG  CAPS Take 1 capsule by mouth as needed.    . meloxicam (MOBIC) 15 MG tablet Take 1 tablet (15 mg total) by mouth daily. 30 tablet 0  . omeprazole (PRILOSEC) 40 MG capsule TAKE 1 CAPSULE(40 MG) BY MOUTH DAILY (Patient taking differently: TAKE 1 CAPSULE(40 MG) BY MOUTH DAILY PRN) 90 capsule 0  . Semaglutide, 1 MG/DOSE, (OZEMPIC, 1 MG/DOSE,) 2 MG/1.5ML SOPN Inject 1 mL into the skin once a week. 9 mL 1  . simvastatin (ZOCOR) 40 MG tablet Take 1 tablet (40 mg total) by mouth at bedtime. 90 tablet 1  . Vitamin D, Ergocalciferol, (DRISDOL) 1.25 MG (50000 UT) CAPS capsule Take 1 capsule (50,000 Units  total) by mouth every 7 (seven) days. 12 capsule 1  . cyclobenzaprine (FLEXERIL) 10 MG tablet Take 1 tablet (10 mg total) by mouth at bedtime. (Patient not taking: Reported on 07/28/2019) 30 tablet 0  . fluticasone (FLONASE) 50 MCG/ACT nasal spray Place 2 sprays into both nostrils daily. (Patient not taking: Reported on 07/28/2019) 16 g 0  . fluticasone furoate-vilanterol (BREO ELLIPTA) 100-25 MCG/INH AEPB Inhale 1 puff into the lungs daily. (Patient not taking: Reported on 07/28/2019) 60 each 0  . traZODone (DESYREL) 50 MG tablet Take 1 tablet (50 mg total) by mouth at bedtime. (Patient not taking: Reported on 07/28/2019) 90 tablet 1   No current facility-administered medications for this visit.     Review of Systems  Constitutional: Positive for malaise/fatigue. Negative for chills, diaphoresis, fever and weight loss.       Feels tired in the morning.  HENT: Positive for sore throat (due to temperature changes in weather). Negative for congestion, ear pain, nosebleeds and sinus pain.   Eyes: Negative.  Negative for blurred vision, double vision and photophobia.  Respiratory: Negative.  Negative for cough, hemoptysis, sputum production, shortness of breath and wheezing.   Cardiovascular: Negative.  Negative for chest pain, palpitations, orthopnea and leg swelling.  Gastrointestinal: Positive for diarrhea (due to Ozempic). Negative for abdominal pain, blood in stool, constipation, heartburn, melena, nausea and vomiting.       Last colonoscopy 08/10/2014.  Genitourinary: Negative.  Negative for dysuria, frequency and urgency.  Musculoskeletal: Negative.  Negative for back pain and joint pain.  Skin: Negative.  Negative for itching and rash.  Neurological: Negative.  Negative for dizziness, tingling, sensory change, speech change, focal weakness, weakness and headaches.  Endo/Heme/Allergies: Negative.  Does not bruise/bleed easily.  Psychiatric/Behavioral: Negative for depression. The patient is not  nervous/anxious and does not have insomnia.    Performance status (ECOG): 1 - Symptomatic but completely ambulatory  Vitals Blood pressure (!) 144/85, pulse (!) 57, temperature 97.8 F (36.6 C), temperature source Oral, resp. rate 18, weight 250 lb 1.8 oz (113.5 kg), SpO2 100 %.   Physical Exam  Constitutional: She is oriented to person, place, and time. She appears well-developed and well-nourished. No distress. Face mask in place.  HENT:  Head: Normocephalic and atraumatic.  Mouth/Throat: Oropharynx is clear and moist. No oropharyngeal exudate.  Brown hair pulled up.  Mask.  Eyes: Pupils are equal, round, and reactive to light. Conjunctivae and EOM are normal. No scleral icterus.  Glasses.  Brown eyes.  Neck: Normal range of motion. Neck supple. No JVD present.  Cardiovascular: Normal rate and regular rhythm. Exam reveals no gallop.  No murmur heard. Pulmonary/Chest: Effort normal and breath sounds normal. She has no wheezes. She has no rales. She exhibits no tenderness.  Abdominal: Soft. Bowel sounds are normal. She  exhibits no mass. There is no abdominal tenderness. There is no rebound and no guarding. A hernia is present.  Musculoskeletal: Normal range of motion.        General: No edema.  Lymphadenopathy:    She has no cervical adenopathy.  Neurological: She is alert and oriented to person, place, and time.  Skin: Skin is warm and dry. No rash noted. She is not diaphoretic. No erythema. No pallor.  Psychiatric: She has a normal mood and affect. Her behavior is normal. Judgment and thought content normal.     No visits with results within 3 Day(s) from this visit.  Latest known visit with results is:  Appointment on 07/24/2019  Component Date Value Ref Range Status  . Iron 07/24/2019 117  28 - 170 ug/dL Final  . TIBC 19/14/782909/18/2020 409  250 - 450 ug/dL Final  . Saturation Ratios 07/24/2019 29  10.4 - 31.8 % Final  . UIBC 07/24/2019 292  ug/dL Final   Performed at [facility], [address] [place], [place] [address]  . Ferritin 07/24/2019 70  11 - 307 ng/mL Final   Performed at [facility], [address] [place], [place] [address]  . Sodium 07/24/2019 137  135 - 145 mmol/L Final  . Potassium 07/24/2019 3.9  3.5 - 5.1 mmol/L Final  . Chloride 07/24/2019 103  98 - 111 mmol/L Final  . CO2 07/24/2019 26  22 - 32 mmol/L Final  . Glucose, Bld 07/24/2019 100* 70 - 99 mg/dL Final  . BUN 84/69/629509/18/2020 17  6 - 20 mg/dL Final  . Creatinine, Ser 07/24/2019 0.76  0.44 - 1.00 mg/dL Final  . Calcium 28/41/324409/18/2020 8.9  8.9 - 10.3 mg/dL Final  . Total Protein 07/24/2019 7.0  6.5 - 8.1 g/dL Final  . Albumin 01/02/725309/18/2020 3.9  3.5 - 5.0 g/dL Final  . AST 66/44/034709/18/2020 17  15 - 41 U/L Final  . ALT 07/24/2019 14  0 - 44 U/L Final  . Alkaline Phosphatase 07/24/2019 98  38 - 126 U/L Final  . Total Bilirubin 07/24/2019 0.9  0.3 - 1.2 mg/dL Final  . GFR calc non Af Amer 07/24/2019 >60  >60 mL/min Final  . GFR calc Af Amer 07/24/2019 >60  >60 mL/min Final  . Anion gap 07/24/2019 8  5 - 15 Final   Performed at [facility], [address] [place], [place] [address]  . WBC 07/24/2019 4.9  4.0 - 10.5 K/uL Final  . RBC 07/24/2019 4.20  3.87 - 5.11 MIL/uL Final  . Hemoglobin 07/24/2019 12.9  12.0 - 15.0 g/dL Final  . HCT 63/87/564309/18/2020 37.4  36.0 - 46.0 % Final  . MCV 07/24/2019 89.0  80.0 - 100.0 fL Final  . MCH 07/24/2019 30.7  26.0 - 34.0 pg Final  . MCHC 07/24/2019 34.5  30.0 - 36.0 g/dL Final  . RDW 32/95/188409/18/2020 12.7  11.5 - 15.5 % Final  . Platelets 07/24/2019 237  150 - 400 K/uL Final  . nRBC 07/24/2019 0.0  0.0 - 0.2 % Final  . Neutrophils Relative % 07/24/2019 55  % Final  . Neutro Abs 07/24/2019 2.7  1.7 - 7.7 K/uL Final  . Lymphocytes Relative 07/24/2019 35  % Final  . Lymphs Abs 07/24/2019 1.7  0.7 - 4.0 K/uL Final  . Monocytes Relative 07/24/2019 8  % Final  . Monocytes Absolute 07/24/2019 0.4  0.1 - 1.0 K/uL Final  . Eosinophils Relative  07/24/2019 2  % Final  .  Eosinophils Absolute 07/24/2019 0.1  0.0 - 0.5 K/uL Final  . Basophils Relative 07/24/2019 0  % Final  . Basophils Absolute 07/24/2019 0.0  0.0 - 0.1 K/uL Final  . Immature Granulocytes 07/24/2019 0  % Final  . Abs Immature Granulocytes 07/24/2019 0.01  0.00 - 0.07 K/uL Final   Performed at [facility], [address] [place], [place] [address]    Assessment:  Savannah Manning is a 58 y.o. female s/p gastric bypass surgery (2005) with iron deficiency anemia and B12 deficiency.    She is s/p hysterectomy in 2012.  Last colonoscopy was on 08/10/2014.  She has received Feraheme: 11/11/2018 and 11/18/2018.  Ferritin has been followed: 8 on 10/12/2016, 11 on 05/01/2017, 7 on 11/03/2018, 173 on 01/16/2019, and 70 on 07/24/2019.   She receives B12 monthly (last 06/11/2019).  She missed some B12 injections during COVID-19.  Symptomatically, she has been feeling fatigued. She does not sleep well at night.  She feels tired in the morning.  Exam is unremarkable.  Plan: 1.   Review labs from 07/24/2019. 2.   Iron deficiency anemia  Review entire medical history, diagnosis and management of iron deficiency anemia.   Discuss long-term plans for IV iron secondary to absorption issues post gastric bypass surgery.   Discuss IV iron when ferritin < 30.  Hematocrit 37.4.  Hemoglobin 12.9.  MCV 89.0.  Ferritin 70.  Iron saturation 29% with a TIBC of 409.  Continue to monitor. 3.   B12 deficiency  Discuss long term plans for B12 supplementation s/p gastric bypass surgery.  B12 today and monthly x 5. 4.   Fatigue  Etiology not related to anemia.  Patient notes being fatigued after sleeping.  Consider overnight oximetry / sleep apnea testing. 5.   RTC in 3 months for labs (CBC with diff, ferritin, folate). 6.   RTC in 6 months for MD assessment, labs (CBC with diff, ferritin, iron studies- day before), B12, and +/- Feraheme.  I discussed the assessment and  treatment plan with the patient.  The patient was provided an opportunity to ask questions and all were answered.  The patient agreed with the plan and demonstrated an understanding of the instructions.  The patient was advised to call back if the symptoms worsen or if the condition fails to improve as anticipated.  I provided 39 minutes (1:18 PM - 1:57 PM) of face-to-face time during this this encounter and > 50% was spent counseling as documented under my assessment and plan.    Melissa C. Merlene Pulling, MD, PhD    07/29/2019, 1:57 PM  I, Mal Misty, am acting as Neurosurgeon for [employer]. Merlene Pulling, MD, PhD.  I, Melissa C. Merlene Pulling, MD, have reviewed the above documentation for accuracy and completeness, and I agree with the above.

## 2019-07-29 ENCOUNTER — Inpatient Hospital Stay: Payer: BC Managed Care – PPO

## 2019-07-29 ENCOUNTER — Other Ambulatory Visit: Payer: Self-pay

## 2019-07-29 ENCOUNTER — Inpatient Hospital Stay (HOSPITAL_BASED_OUTPATIENT_CLINIC_OR_DEPARTMENT_OTHER): Payer: BC Managed Care – PPO | Admitting: Hematology and Oncology

## 2019-07-29 VITALS — BP 144/85 | HR 57 | Temp 97.8°F | Resp 18 | Wt 250.1 lb

## 2019-07-29 DIAGNOSIS — D509 Iron deficiency anemia, unspecified: Secondary | ICD-10-CM | POA: Diagnosis not present

## 2019-07-29 DIAGNOSIS — E538 Deficiency of other specified B group vitamins: Secondary | ICD-10-CM | POA: Diagnosis not present

## 2019-07-29 DIAGNOSIS — K9589 Other complications of other bariatric procedure: Secondary | ICD-10-CM

## 2019-07-29 DIAGNOSIS — D508 Other iron deficiency anemias: Secondary | ICD-10-CM

## 2019-07-29 DIAGNOSIS — K909 Intestinal malabsorption, unspecified: Secondary | ICD-10-CM | POA: Diagnosis not present

## 2019-07-29 DIAGNOSIS — Z9884 Bariatric surgery status: Secondary | ICD-10-CM | POA: Diagnosis not present

## 2019-07-29 DIAGNOSIS — Z90710 Acquired absence of both cervix and uterus: Secondary | ICD-10-CM | POA: Diagnosis not present

## 2019-07-29 DIAGNOSIS — Z79899 Other long term (current) drug therapy: Secondary | ICD-10-CM | POA: Diagnosis not present

## 2019-07-29 DIAGNOSIS — I10 Essential (primary) hypertension: Secondary | ICD-10-CM | POA: Diagnosis not present

## 2019-07-29 MED ORDER — CYANOCOBALAMIN 1000 MCG/ML IJ SOLN
1000.0000 ug | Freq: Once | INTRAMUSCULAR | Status: AC
  Administered 2019-07-29: 1000 ug via INTRAMUSCULAR

## 2019-07-29 NOTE — Patient Instructions (Signed)

## 2019-09-02 ENCOUNTER — Other Ambulatory Visit: Payer: Self-pay

## 2019-09-02 ENCOUNTER — Inpatient Hospital Stay: Payer: BC Managed Care – PPO | Attending: Hematology and Oncology

## 2019-09-02 VITALS — BP 125/95 | HR 56 | Temp 96.6°F | Resp 20

## 2019-09-02 DIAGNOSIS — E538 Deficiency of other specified B group vitamins: Secondary | ICD-10-CM | POA: Insufficient documentation

## 2019-09-02 DIAGNOSIS — K909 Intestinal malabsorption, unspecified: Secondary | ICD-10-CM

## 2019-09-02 MED ORDER — CYANOCOBALAMIN 1000 MCG/ML IJ SOLN
1000.0000 ug | Freq: Once | INTRAMUSCULAR | Status: AC
  Administered 2019-09-02: 1000 ug via INTRAMUSCULAR

## 2019-09-02 NOTE — Patient Instructions (Signed)

## 2019-09-24 ENCOUNTER — Other Ambulatory Visit: Payer: Self-pay | Admitting: Advanced Practice Midwife

## 2019-09-24 ENCOUNTER — Other Ambulatory Visit: Payer: Self-pay | Admitting: Family Medicine

## 2019-09-24 DIAGNOSIS — Z1231 Encounter for screening mammogram for malignant neoplasm of breast: Secondary | ICD-10-CM

## 2019-09-30 ENCOUNTER — Inpatient Hospital Stay: Payer: BC Managed Care – PPO | Attending: Hematology and Oncology

## 2019-09-30 ENCOUNTER — Other Ambulatory Visit: Payer: Self-pay

## 2019-09-30 ENCOUNTER — Other Ambulatory Visit: Payer: BC Managed Care – PPO

## 2019-09-30 VITALS — BP 125/79 | HR 62 | Temp 97.7°F | Resp 20

## 2019-09-30 DIAGNOSIS — E538 Deficiency of other specified B group vitamins: Secondary | ICD-10-CM | POA: Diagnosis not present

## 2019-09-30 DIAGNOSIS — K909 Intestinal malabsorption, unspecified: Secondary | ICD-10-CM

## 2019-09-30 MED ORDER — CYANOCOBALAMIN 1000 MCG/ML IJ SOLN
1000.0000 ug | Freq: Once | INTRAMUSCULAR | Status: AC
  Administered 2019-09-30: 14:00:00 1000 ug via INTRAMUSCULAR

## 2019-09-30 NOTE — Patient Instructions (Signed)

## 2019-10-09 DIAGNOSIS — Z20828 Contact with and (suspected) exposure to other viral communicable diseases: Secondary | ICD-10-CM | POA: Diagnosis not present

## 2019-10-27 ENCOUNTER — Other Ambulatory Visit: Payer: Self-pay

## 2019-10-28 ENCOUNTER — Inpatient Hospital Stay: Payer: BC Managed Care – PPO

## 2019-10-28 ENCOUNTER — Other Ambulatory Visit: Payer: Self-pay

## 2019-10-28 ENCOUNTER — Inpatient Hospital Stay: Payer: BC Managed Care – PPO | Attending: Hematology and Oncology

## 2019-10-28 VITALS — BP 125/86 | HR 61 | Temp 97.0°F | Resp 18

## 2019-10-28 DIAGNOSIS — D509 Iron deficiency anemia, unspecified: Secondary | ICD-10-CM

## 2019-10-28 DIAGNOSIS — E538 Deficiency of other specified B group vitamins: Secondary | ICD-10-CM | POA: Insufficient documentation

## 2019-10-28 DIAGNOSIS — K909 Intestinal malabsorption, unspecified: Secondary | ICD-10-CM

## 2019-10-28 DIAGNOSIS — K9589 Other complications of other bariatric procedure: Secondary | ICD-10-CM

## 2019-10-28 LAB — CBC WITH DIFFERENTIAL/PLATELET
Abs Immature Granulocytes: 0.01 10*3/uL (ref 0.00–0.07)
Basophils Absolute: 0 10*3/uL (ref 0.0–0.1)
Basophils Relative: 0 %
Eosinophils Absolute: 0.1 10*3/uL (ref 0.0–0.5)
Eosinophils Relative: 2 %
HCT: 37.8 % (ref 36.0–46.0)
Hemoglobin: 13.1 g/dL (ref 12.0–15.0)
Immature Granulocytes: 0 %
Lymphocytes Relative: 37 %
Lymphs Abs: 2.7 10*3/uL (ref 0.7–4.0)
MCH: 30.5 pg (ref 26.0–34.0)
MCHC: 34.7 g/dL (ref 30.0–36.0)
MCV: 87.9 fL (ref 80.0–100.0)
Monocytes Absolute: 0.7 10*3/uL (ref 0.1–1.0)
Monocytes Relative: 9 %
Neutro Abs: 3.8 10*3/uL (ref 1.7–7.7)
Neutrophils Relative %: 52 %
Platelets: 236 10*3/uL (ref 150–400)
RBC: 4.3 MIL/uL (ref 3.87–5.11)
RDW: 12.5 % (ref 11.5–15.5)
WBC: 7.3 10*3/uL (ref 4.0–10.5)
nRBC: 0 % (ref 0.0–0.2)

## 2019-10-28 LAB — FERRITIN: Ferritin: 65 ng/mL (ref 11–307)

## 2019-10-28 LAB — FOLATE: Folate: 8.6 ng/mL (ref >5.9)

## 2019-10-28 MED ORDER — CYANOCOBALAMIN 1000 MCG/ML IJ SOLN
1000.0000 ug | Freq: Once | INTRAMUSCULAR | Status: AC
  Administered 2019-10-28: 14:00:00 1000 ug via INTRAMUSCULAR

## 2019-10-28 MED ORDER — CYANOCOBALAMIN 1000 MCG/ML IJ SOLN
INTRAMUSCULAR | Status: AC
  Filled 2019-10-28: qty 1

## 2019-11-02 LAB — HM DIABETES EYE EXAM

## 2019-11-05 ENCOUNTER — Other Ambulatory Visit: Payer: Self-pay

## 2019-11-05 ENCOUNTER — Encounter: Payer: Self-pay | Admitting: Advanced Practice Midwife

## 2019-11-05 ENCOUNTER — Ambulatory Visit (INDEPENDENT_AMBULATORY_CARE_PROVIDER_SITE_OTHER): Payer: BC Managed Care – PPO | Admitting: Advanced Practice Midwife

## 2019-11-05 ENCOUNTER — Ambulatory Visit
Admission: RE | Admit: 2019-11-05 | Discharge: 2019-11-05 | Disposition: A | Discharge status: Home / Self Care | Payer: BC Managed Care – PPO | Source: Ambulatory Visit | Attending: Family Medicine | Admitting: Family Medicine

## 2019-11-05 VITALS — BP 132/87 | HR 50 | Ht 67.0 in | Wt 245.0 lb

## 2019-11-05 DIAGNOSIS — Z0000 Encounter for general adult medical examination without abnormal findings: Secondary | ICD-10-CM

## 2019-11-05 DIAGNOSIS — Z01419 Encounter for gynecological examination (general) (routine) without abnormal findings: Secondary | ICD-10-CM | POA: Diagnosis not present

## 2019-11-05 DIAGNOSIS — B369 Superficial mycosis, unspecified: Secondary | ICD-10-CM

## 2019-11-05 DIAGNOSIS — Z1231 Encounter for screening mammogram for malignant neoplasm of breast: Secondary | ICD-10-CM

## 2019-11-05 MED ORDER — NYSTATIN 100000 UNIT/GM EX CREA: 1.0000 "application " | TOPICAL_CREAM | Freq: Two times a day (BID) | CUTANEOUS | 5 refills | Status: DC

## 2019-11-05 NOTE — Progress Notes (Addendum)
Gynecology Annual Exam  PCP: Steele Sizer, MD  Chief Complaint:  Chief Complaint  Patient presents with  . Gynecologic Exam    History of Present Illness:Patient is a 58 y.o. G2P0011 presents for annual exam. The patient has no gyn complaints today. She has an area under panis especially on her right side that is raw and sometimes itchy. This has been an ongoing concern and she has used anti fungal cream successfully in the past. She requests a new prescription.   LMP: No LMP recorded. Patient has had a hysterectomy. Menarche:not applicable  Postcoital Bleeding: not applicable Dysmenorrhea: not applicable  The patient is not currently sexually active. She denies dyspareunia.  The patient does perform self breast exams.  There is no notable family history of breast or ovarian cancer in her family.  The patient wears seatbelts: yes.   The patient has regular exercise: she does not regularly exercise. She has tried to improve her diet and admits that sweet tea is her weakness. She gets 5-6 hours of sleep per night.    The patient denies current symptoms of depression.     Review of Systems: Review of Systems  Constitutional: Negative.   HENT: Negative.   Eyes: Negative.   Respiratory: Negative.   Cardiovascular: Negative.   Gastrointestinal: Negative.   Genitourinary: Negative.   Musculoskeletal: Negative.   Skin: Positive for itching and rash.  Neurological: Negative.   Endo/Heme/Allergies: Negative.   Psychiatric/Behavioral: Negative.     Past Medical History:  Past Medical History:  Diagnosis Date  . Anemia   . Arthritis   . Asthma   . GERD (gastroesophageal reflux disease)   . Headache   . Hypertension     Past Surgical History:  Past Surgical History:  Procedure Laterality Date  . ABDOMINAL HYSTERECTOMY  2012  . CHOLECYSTECTOMY    . DILATION AND CURETTAGE OF UTERUS    . GASTRIC BYPASS  2005  . INDUCED ABORTION N/A 1997   patient was about 22 weeks   . SHOULDER ARTHROSCOPY WITH OPEN ROTATOR CUFF REPAIR Left 06/14/2016   Procedure: SHOULDER ARTHROSCOPY WITH OPEN ROTATOR CUFF REPAIR, DECOMPRESSION, EXCISION LOOSE BODY, DEBRIDEMENT;  Surgeon: Corky Mull, MD;  Location: [facility] ORS;  Service: Orthopedics;  Laterality: Left;    Gynecologic History:  No LMP recorded. Patient has had a hysterectomy. Last Pap: 2 years ago Results were:  no abnormalities  Last mammogram: 1 year ago Results were: BI-RAD I  Obstetric History: G27P0011  Family History:  Family History  Problem Relation Age of Onset  . Hypertension Mother   . Cancer Father        prostate  . Heart disease Brother   . Breast cancer Cousin 71       mat cousin    Social History:  Social History   Socioeconomic History  . Marital status: Single    Spouse name: Not on file  . Number of children: 1  . Years of education: Not on file  . Highest education level: Not on file  Occupational History  . Not on file  Tobacco Use  . Smoking status: Never Smoker  . Smokeless tobacco: Never Used  Substance and Sexual Activity  . Alcohol use: No    Alcohol/week: 0.0 standard drinks  . Drug use: No  . Sexual activity: Not Currently  Other Topics Concern  . Not on file  Social History Narrative   Desk job/works for insurance; no smoking; no alcohol; lives in  [place].    Social Determinants of Health   Financial Resource Strain: Low Risk   . Difficulty of Paying Living Expenses: Not hard at all  Food Insecurity: No Food Insecurity  . Worried About Programme researcher, broadcasting/film/videounning Out of Food in the Last Year: Never true  . Ran Out of Food in the Last Year: Never true  Transportation Needs: No Transportation Needs  . Lack of Transportation (Medical): No  . Lack of Transportation (Non-Medical): No  Physical Activity: Inactive  . Days of Exercise per Week: 0 days  . Minutes of Exercise per Session: 0 min  Stress: No Stress Concern Present  . Feeling of Stress : Not at all  Social Connections:  Somewhat Isolated  . Frequency of Communication with Friends and Family: More than three times a week  . Frequency of Social Gatherings with Friends and Family: More than three times a week  . Attends Religious Services: 1 to 4 times per year  . Active Member of Clubs or Organizations: Not on file  . Attends BankerClub or Organization Meetings: Never  . Marital Status: Never married  Intimate Partner Violence: Not At Risk  . Fear of Current or Ex-Partner: No  . Emotionally Abused: No  . Physically Abused: No  . Sexually Abused: No    Allergies:  Allergies  Allergen Reactions  . Lisinopril Swelling    Facial Swelling  . Ace Inhibitors     angioedema  . Codeine Swelling  . Valsartan Swelling    Medications: Prior to Admission medications   Medication Sig Start Date End Date Taking? Authorizing Provider  Cyanocobalamin (B-12) 1000 MCG SUBL Place 1 tablet under the tongue daily. 03/05/19  Yes Sowles, Danna HeftyKrichna, MD  cyclobenzaprine (FLEXERIL) 10 MG tablet Take 1 tablet (10 mg total) by mouth at bedtime. 06/19/19  Yes [name], [place], MD  fluticasone (FLONASE) 50 MCG/ACT nasal spray Place 2 sprays into both nostrils daily. 11/03/18  Yes Doren CustardBoyce, Emily E, FNP  fluticasone furoate-vilanterol (BREO ELLIPTA) 100-25 MCG/INH AEPB Inhale 1 puff into the lungs daily. 03/05/19  Yes Sowles, Danna HeftyKrichna, MD  hydrochlorothiazide (HYDRODIURIL) 25 MG tablet Take 1 tablet (25 mg total) by mouth daily. 06/26/19  Yes Sowles, Danna HeftyKrichna, MD  Loratadine 10 MG CAPS Take 1 capsule by mouth as needed.   Yes [provider]  meloxicam (MOBIC) 15 MG tablet Take 1 tablet (15 mg total) by mouth daily. 06/26/19  Yes Sowles, Danna HeftyKrichna, MD  omeprazole (PRILOSEC) 40 MG capsule TAKE 1 CAPSULE(40 MG) BY MOUTH DAILY Patient taking differently: TAKE 1 CAPSULE(40 MG) BY MOUTH DAILY PRN 12/05/18  Yes Doren CustardBoyce, Emily E, FNP  Semaglutide, 1 MG/DOSE, (OZEMPIC, 1 MG/DOSE,) 2 MG/1.5ML SOPN Inject 1 mL into the skin once a week. 06/26/19  Yes  Sowles, Danna HeftyKrichna, MD  simvastatin (ZOCOR) 40 MG tablet Take 1 tablet (40 mg total) by mouth at bedtime. 06/26/19  Yes Sowles, Danna HeftyKrichna, MD  Vitamin D, Ergocalciferol, (DRISDOL) 1.25 MG (50000 UT) CAPS capsule Take 1 capsule (50,000 Units total) by mouth every 7 (seven) days. 06/26/19  Yes Sowles, Danna HeftyKrichna, MD  nystatin cream (MYCOSTATIN) Apply 1 application topically 2 (two) times daily. 11/05/19   Tresea MallGledhill, Jafeth Mustin, CNM  traZODone (DESYREL) 50 MG tablet Take 1 tablet (50 mg total) by mouth at bedtime. Patient not taking: Reported on 11/05/2019 05/13/15   Alba CorySowles, Krichna, MD    Physical Exam Vitals: Blood pressure 132/87, pulse (!) 50, height 5\' 7"  (1.702 m), weight 245 lb (111.1 kg).  General: NAD HEENT: normocephalic, anicteric Thyroid: no  enlargement, no palpable nodules Pulmonary: No increased work of breathing, CTAB Cardiovascular: RRR, distal pulses 2+ Breast: Breast symmetrical, no tenderness, no palpable nodules or masses, no skin or nipple retraction present, no nipple discharge.  No axillary or supraclavicular lymphadenopathy. Abdomen: NABS, soft, non-tender, non-distended.  Umbilicus without lesions.  No hepatomegaly, splenomegaly or masses palpable. No evidence of hernia  Genitourinary:  External: Normal external female genitalia.  Normal urethral meatus, normal Bartholin's and Skene's glands.    Vagina: Normal vaginal mucosa, no evidence of prolapse.    Cervix: Grossly normal in appearance, no bleeding  Uterus: surgically absent  Adnexa: deferred  Rectal: deferred  Lymphatic: no evidence of inguinal lymphadenopathy Extremities: no edema, erythema, or tenderness Neurologic: Grossly intact Psychiatric: mood appropriate, affect full     Assessment: 58 y.o. G2P0011 routine annual exam  Plan: Problem List Items Addressed This Visit    None    Visit Diagnoses    Well woman exam without gynecological exam    -  Primary   Relevant Orders   Mammogram Screening Routine   Fungal  infection of skin of abdomen       Relevant Medications   nystatin cream (MYCOSTATIN)      1) Mammogram - recommend yearly screening mammogram.  Mammogram Was ordered today  2) STI screening  was offered and declined  3) ASCCP guidelines and rationale discussed.  Patient opts for every 3 years screening interval  4) Osteoporosis  - per USPTF routine screening DEXA at age 35   Consider FDA-approved medical therapies in postmenopausal women and men aged 68 years and older, based on the following: a) A hip or vertebral (clinical or morphometric) fracture b) T-score ? -2.5 at the femoral neck or spine after appropriate evaluation to exclude secondary causes C) Low bone mass (T-score between -1.0 and -2.5 at the femoral neck or spine) and a 10-year probability of a hip fracture ? 3% or a 10-year probability of a major osteoporosis-related fracture ? 20% based on the [place]-adapted WHO algorithm   5) Routine healthcare maintenance including cholesterol, diabetes screening discussed managed by PCP  6) Colonoscopy: patient states last colonoscopy was 2 years ago and was normal. Last documented screening was 5 years ago and states polyp was removed- repeat in 5 years. Will send MyChart message to patient to remind her of this finding.    Screening recommended starting at age 79 for average risk individuals, age 19 for individuals deemed at increased risk (including African Americans) and recommended to continue until age 59.  For patient age 32-85 individualized approach is recommended.  Gold standard screening is via colonoscopy, Cologuard screening is an acceptable alternative for patient unwilling or unable to undergo colonoscopy.  "Colorectal cancer screening for average?risk adults: 2018 guideline update from the American Cancer Society"CA: A Cancer Journal for Clinicians: Apr 03, 2017.   7) Return in about 1 year (around 11/04/2020) for annual established gyn.    Tresea Mall, CNM [facility]   11/05/2019, 8:57 AM

## 2019-11-09 ENCOUNTER — Telehealth: Payer: Self-pay

## 2019-11-09 ENCOUNTER — Other Ambulatory Visit: Payer: Self-pay

## 2019-11-09 DIAGNOSIS — Z1211 Encounter for screening for malignant neoplasm of colon: Secondary | ICD-10-CM

## 2019-11-09 NOTE — Telephone Encounter (Signed)
Gastroenterology Pre-Procedure Review  Request Date: 12/11/19 Requesting Physician: Dr. Allegra Lai  PATIENT REVIEW QUESTIONS: The patient responded to the following health history questions as indicated:    1. Are you having any GI issues? no 2. Do you have a personal history of Polyps? yes (2015 colon polyp noted in chart) 3. Do you have a family history of Colon Cancer or Polyps? no 4. Diabetes Mellitus? no 5. Joint replacements in the past 12 months?no 6. Major health problems in the past 3 months?no 7. Any artificial heart valves, MVP, or defibrillator?no    MEDICATIONS & ALLERGIES:    Patient reports the following regarding taking any anticoagulation/antiplatelet therapy:   Plavix, Coumadin, Eliquis, Xarelto, Lovenox, Pradaxa, Brilinta, or Effient? no Aspirin? no  Patient confirms/reports the following medications:  Current Outpatient Medications  Medication Sig Dispense Refill  . Cyanocobalamin (B-12) 1000 MCG SUBL Place 1 tablet under the tongue daily. 30 each 5  . cyclobenzaprine (FLEXERIL) 10 MG tablet Take 1 tablet (10 mg total) by mouth at bedtime. 30 tablet 0  . fluticasone (FLONASE) 50 MCG/ACT nasal spray Place 2 sprays into both nostrils daily. 16 g 0  . fluticasone furoate-vilanterol (BREO ELLIPTA) 100-25 MCG/INH AEPB Inhale 1 puff into the lungs daily. 60 each 0  . hydrochlorothiazide (HYDRODIURIL) 25 MG tablet Take 1 tablet (25 mg total) by mouth daily. 90 tablet 1  . Loratadine 10 MG CAPS Take 1 capsule by mouth as needed.    . meloxicam (MOBIC) 15 MG tablet Take 1 tablet (15 mg total) by mouth daily. 30 tablet 0  . nystatin cream (MYCOSTATIN) Apply 1 application topically 2 (two) times daily. 30 g 5  . omeprazole (PRILOSEC) 40 MG capsule TAKE 1 CAPSULE(40 MG) BY MOUTH DAILY (Patient taking differently: TAKE 1 CAPSULE(40 MG) BY MOUTH DAILY PRN) 90 capsule 0  . Semaglutide, 1 MG/DOSE, (OZEMPIC, 1 MG/DOSE,) 2 MG/1.5ML SOPN Inject 1 mL into the skin once a week. 9 mL 1  .  simvastatin (ZOCOR) 40 MG tablet Take 1 tablet (40 mg total) by mouth at bedtime. 90 tablet 1  . traZODone (DESYREL) 50 MG tablet Take 1 tablet (50 mg total) by mouth at bedtime. (Patient not taking: Reported on 11/05/2019) 90 tablet 1  . Vitamin D, Ergocalciferol, (DRISDOL) 1.25 MG (50000 UT) CAPS capsule Take 1 capsule (50,000 Units total) by mouth every 7 (seven) days. 12 capsule 1   No current facility-administered medications for this visit.    Patient confirms/reports the following allergies:  Allergies  Allergen Reactions  . Lisinopril Swelling    Facial Swelling  . Ace Inhibitors     angioedema  . Codeine Swelling  . Valsartan Swelling    No orders of the defined types were placed in this encounter.   AUTHORIZATION INFORMATION Primary Insurance: 1D#: Group #:  Secondary Insurance: 1D#: Group #:  SCHEDULE INFORMATION: Date: 12/11/19 Time: Location:[facility]

## 2019-11-11 ENCOUNTER — Other Ambulatory Visit: Payer: Self-pay | Admitting: Family Medicine

## 2019-11-11 DIAGNOSIS — K219 Gastro-esophageal reflux disease without esophagitis: Secondary | ICD-10-CM

## 2019-11-11 NOTE — Telephone Encounter (Signed)
Requested medication (s) are due for refill today:  Yes  Requested medication (s) are on the active medication list:   Yes  Future visit scheduled:   No   Last ordered: 12/05/2018  #90 0   Was not sure how this is to be prescribed.   It looks like the pt is taking it differently.  Requested Prescriptions  Pending Prescriptions Disp Refills   omeprazole (PRILOSEC) 40 MG capsule [Pharmacy Med Name: OMEPRAZOLE 40MG  CAPSULES] 90 capsule 0    Sig: TAKE 1 CAPSULE(40 MG) BY MOUTH DAILY      Gastroenterology: Proton Pump Inhibitors Passed - 11/11/2019 12:20 PM      Passed - Valid encounter within last 12 months    Recent Outpatient Visits           4 months ago Pain, chest wall   [facility], MD   8 months ago Morbid obesity, unspecified obesity type [facility])   [facility], MD   11 months ago Morbid obesity, unspecified obesity type [facility])   [facility], FNP   1 year ago Lightheadedness   [facility]   2 years ago Pre-diabetes   [facility], MD

## 2019-11-17 ENCOUNTER — Telehealth: Payer: Self-pay

## 2019-11-17 NOTE — Telephone Encounter (Signed)
Called and left a message for call back  

## 2019-11-17 NOTE — Telephone Encounter (Signed)
Called and left a message for call back about rescheduled procedure on 12/11/2019

## 2019-11-17 NOTE — Telephone Encounter (Signed)
Patient is moved to 12/18/2019 to [place] with Dr. Allegra Lai. Informed patient that COVID test would be 12/16/2019. [facility] [place] of the change and they are moving her to 12/18/2019

## 2019-11-17 NOTE — Telephone Encounter (Signed)
Patient called & is returning Ashley's call.

## 2019-12-02 ENCOUNTER — Inpatient Hospital Stay: Payer: BC Managed Care – PPO | Attending: Hematology and Oncology

## 2019-12-02 ENCOUNTER — Other Ambulatory Visit: Payer: Self-pay

## 2019-12-02 VITALS — BP 150/89 | HR 60 | Temp 97.8°F | Resp 18

## 2019-12-02 DIAGNOSIS — E538 Deficiency of other specified B group vitamins: Secondary | ICD-10-CM | POA: Insufficient documentation

## 2019-12-02 DIAGNOSIS — K909 Intestinal malabsorption, unspecified: Secondary | ICD-10-CM

## 2019-12-02 MED ORDER — CYANOCOBALAMIN 1000 MCG/ML IJ SOLN
1000.0000 ug | Freq: Once | INTRAMUSCULAR | Status: AC
  Administered 2019-12-02: 14:00:00 1000 ug via INTRAMUSCULAR

## 2019-12-02 NOTE — Patient Instructions (Signed)

## 2019-12-04 ENCOUNTER — Encounter: Payer: Self-pay | Admitting: Family Medicine

## 2019-12-04 ENCOUNTER — Other Ambulatory Visit: Payer: Self-pay

## 2019-12-04 ENCOUNTER — Ambulatory Visit (INDEPENDENT_AMBULATORY_CARE_PROVIDER_SITE_OTHER): Payer: BC Managed Care – PPO | Admitting: Family Medicine

## 2019-12-04 DIAGNOSIS — R7303 Prediabetes: Secondary | ICD-10-CM

## 2019-12-04 DIAGNOSIS — I10 Essential (primary) hypertension: Secondary | ICD-10-CM | POA: Diagnosis not present

## 2019-12-04 DIAGNOSIS — M7062 Trochanteric bursitis, left hip: Secondary | ICD-10-CM

## 2019-12-04 DIAGNOSIS — K219 Gastro-esophageal reflux disease without esophagitis: Secondary | ICD-10-CM | POA: Diagnosis not present

## 2019-12-04 DIAGNOSIS — E785 Hyperlipidemia, unspecified: Secondary | ICD-10-CM

## 2019-12-04 DIAGNOSIS — D509 Iron deficiency anemia, unspecified: Secondary | ICD-10-CM

## 2019-12-04 DIAGNOSIS — M7061 Trochanteric bursitis, right hip: Secondary | ICD-10-CM

## 2019-12-04 DIAGNOSIS — E538 Deficiency of other specified B group vitamins: Secondary | ICD-10-CM

## 2019-12-04 DIAGNOSIS — J4520 Mild intermittent asthma, uncomplicated: Secondary | ICD-10-CM

## 2019-12-04 DIAGNOSIS — Z9884 Bariatric surgery status: Secondary | ICD-10-CM

## 2019-12-04 DIAGNOSIS — E559 Vitamin D deficiency, unspecified: Secondary | ICD-10-CM | POA: Diagnosis not present

## 2019-12-04 MED ORDER — HYDROCHLOROTHIAZIDE 25 MG PO TABS: 25.0000 mg | ORAL_TABLET | Freq: Every day | ORAL | 1 refills | Status: DC

## 2019-12-04 MED ORDER — VITAMIN D (ERGOCALCIFEROL) 1.25 MG (50000 UNIT) PO CAPS: 50000.0000 [IU] | ORAL_CAPSULE | ORAL | 1 refills | Status: DC

## 2019-12-04 MED ORDER — ROSUVASTATIN CALCIUM 10 MG PO TABS: 10.0000 mg | ORAL_TABLET | Freq: Every day | ORAL | 1 refills | Status: DC

## 2019-12-04 MED ORDER — ALBUTEROL SULFATE HFA 108 (90 BASE) MCG/ACT IN AERS: 2.0000 | INHALATION_SPRAY | Freq: Four times a day (QID) | RESPIRATORY_TRACT | 0 refills | Status: DC | PRN

## 2019-12-04 NOTE — Progress Notes (Signed)
Name: Savannah Manning   MRN: [id number]    DOB: December 10, 1960   Date:12/04/2019       Progress Note  Subjective  Chief Complaint  Chief Complaint  Patient presents with  . Medication Management  . Hip Pain    She has hip pain in both hips x 2 months. Pain is worse at night when she is on her left side. Climbing is difficult for her.  . Abdominal Pain    She has been having chills after she eats and then she has to go to the bathroom.    HPI  Obesity/prediabetes:. She has been obese since childhood. She has tried multiple diets in the past, she hadgastric bypass in 2005. Weight before surgery was 314 lbs, she went down to 164 lbs, but has been gradually gaining weight. She states her weight has gone up since she started to work from home in  2010.She had [facility] done at [facility] was denied for a revision in the summer 2018. She has failed Metformin, started her on Ozempic  04/2017 her  weight was 259 lbs, her weight went as low as 239 lbs however today is up to 243 lbs. Her insurance has denied Ozempic, only paying for patients that have DM. We gave her a sample of lower dose Ozempic to wean self off, needs to cut down on carbohydrates  Hip outer Pain: she has noticed pain on both outer hips worse when sleeping and also when going up and down stairs. Explained she cannot take nsaid's  History of iron deficiency anemia and B12 deficiency: she is under the care of Dr. Mike Gip and is getting monthly B12 and did not require iron infusion lately. Last ferritin was 65   Insomnia: she has not been taking Trazodone, her son is in [place] and worries about him but also states does not like being at home alone at night.   GERD: she is doing well now,taking PPI prn only and symptoms are controled   HTN: taking hctz, denies side effects of medication, no sob, chest pain or palpitation   Vitamin D deficiency: continue supplements , we will recheck levels   Dumping  syndrome: she had one episode of abdominal pain after a meal, intermittent, less than once a month, resolves after an episode of diarrhea, likely dumping advised to discuss with gi and keep a diary of food that triggers symptoms.   Patient Active Problem List   Diagnosis Date Noted  . Pre-diabetes 12/05/2018  . Hyperglycemia 12/05/2018  . Iron deficiency anemia 12/05/2018  . Malabsorption of iron 11/07/2018  . Chronic pain of right knee 07/26/2017  . Vitamin D deficiency 04/24/2017  . B12 deficiency 04/24/2017  . Abnormal mammogram of right breast 01/01/2017  . Asthma, mild persistent 12/17/2016  . History of shoulder surgery 10/12/2016  . Primary osteoarthritis of left shoulder 06/14/2016  . Incomplete tear of left rotator cuff 05/30/2016  . Rotator cuff tendinitis 05/30/2016  . History of bariatric surgery 05/04/2016  . Allergic rhinitis 05/12/2015  . Benign essential HTN 05/12/2015  . Anxiety and depression 05/12/2015  . Insomnia, persistent 05/12/2015  . Dyslipidemia 05/12/2015  . Edema extremities 05/12/2015  . Gastroesophageal reflux disease without esophagitis 05/12/2015  . Hypoglycemia 05/12/2015  . Eczema intertrigo 05/12/2015  . Migraine 05/12/2015  . Plantar fasciitis 05/12/2015  . Umbilical hernia without obstruction and without gangrene 05/12/2015  . Morbid obesity, unspecified obesity type ([place]) 02/13/2008    Past Surgical History:  Procedure Laterality  Date  . ABDOMINAL HYSTERECTOMY  2012  . CHOLECYSTECTOMY    . DILATION AND CURETTAGE OF UTERUS    . GASTRIC BYPASS  2005  . INDUCED ABORTION N/A 1997   patient was about 22 weeks  . SHOULDER ARTHROSCOPY WITH OPEN ROTATOR CUFF REPAIR Left 06/14/2016   Procedure: SHOULDER ARTHROSCOPY WITH OPEN ROTATOR CUFF REPAIR, DECOMPRESSION, EXCISION LOOSE BODY, DEBRIDEMENT;  Surgeon: Christena Flake, MD;  Location: [facility] ORS;  Service: Orthopedics;  Laterality: Left;    Family History  Problem Relation Age of Onset  .  Hypertension Mother   . Cancer Father        prostate  . Heart disease Brother   . Breast cancer Cousin 40       mat cousin     Current Outpatient Medications:  .  Cyanocobalamin (B-12) 1000 MCG SUBL, Place 1 tablet under the tongue daily., Disp: 30 each, Rfl: 5 .  fluticasone (FLONASE) 50 MCG/ACT nasal spray, Place 2 sprays into both nostrils daily., Disp: 16 g, Rfl: 0 .  hydrochlorothiazide (HYDRODIURIL) 25 MG tablet, Take 1 tablet (25 mg total) by mouth daily., Disp: 90 tablet, Rfl: 1 .  Loratadine 10 MG CAPS, Take 1 capsule by mouth as needed., Disp: , Rfl:  .  nystatin cream (MYCOSTATIN), Apply 1 application topically 2 (two) times daily., Disp: 30 g, Rfl: 5 .  omeprazole (PRILOSEC) 40 MG capsule, TAKE 1 CAPSULE(40 MG) BY MOUTH DAILY, Disp: 90 capsule, Rfl: 0 .  traZODone (DESYREL) 50 MG tablet, Take 1 tablet (50 mg total) by mouth at bedtime., Disp: 90 tablet, Rfl: 1 .  Vitamin D, Ergocalciferol, (DRISDOL) 1.25 MG (50000 UT) CAPS capsule, Take 1 capsule (50,000 Units total) by mouth every 7 (seven) days., Disp: 12 capsule, Rfl: 1  Allergies  Allergen Reactions  . Lisinopril Swelling    Facial Swelling  . Ace Inhibitors     angioedema  . Codeine Swelling  . Valsartan Swelling    I personally reviewed active problem list, medication list, allergies, family history, social history with the patient/caregiver today.   ROS  Constitutional: Negative for fever or significant  weight change.  Respiratory: Negative for cough and shortness of breath.   Cardiovascular: Negative for chest pain or palpitations.  Gastrointestinal: Negative for abdominal pain ( had a recent episode - likely dumping syndrome) , no bowel changes.  Musculoskeletal: Negative for gait problem or joint swelling.  Skin: Negative for rash.  Neurological: Negative for dizziness or headache.  No other specific complaints in a complete review of systems (except as listed in HPI above).  Objective  Vitals:    12/04/19 0821  BP: 130/80  Pulse: 70  Resp: 16  Temp: (!) 96.6 F (35.9 C)  TempSrc: Temporal  SpO2: 98%  Weight: 243 lb 9.6 oz (110.5 kg)  Height: 5\' 7"  (1.702 m)    Body mass index is 38.15 kg/m.  Physical Exam  Constitutional: Patient appears well-developed and well-nourished. Obese No distress.  HEENT: head atraumatic, normocephalic, pupils equal and reactive to light Cardiovascular: Normal rate, regular rhythm and normal heart sounds.  No murmur heard. No BLE edema. Pulmonary/Chest: Effort normal and breath sounds normal. No respiratory distress. Abdominal: Soft.  There is no tenderness. Muscular Skeletal:  Pain during palpation of trochanteric bursa bilaterally  Psychiatric: Patient has a normal mood and affect. behavior is normal. Judgment and thought content normal.   Recent Results (from the past 2160 hour(s))  Folate     Status: None  Collection Time: 10/28/19  1:38 PM  Result Value Ref Range   Folate 8.6 >5.9 ng/mL    Comment: Performed at [facility], [address] [place], [place] [address]  Ferritin     Status: None   Collection Time: 10/28/19  1:38 PM  Result Value Ref Range   Ferritin 65 11 - 307 ng/mL    Comment: Performed at [facility], [address] [place], [place] [address]  CBC with Differential/Platelet     Status: None   Collection Time: 10/28/19  1:38 PM  Result Value Ref Range   WBC 7.3 4.0 - 10.5 K/uL   RBC 4.30 3.87 - 5.11 MIL/uL   Hemoglobin 13.1 12.0 - 15.0 g/dL   HCT 75.1 02.5 - 85.2 %   MCV 87.9 80.0 - 100.0 fL   MCH 30.5 26.0 - 34.0 pg   MCHC 34.7 30.0 - 36.0 g/dL   RDW 77.8 24.2 - 35.3 %   Platelets 236 150 - 400 K/uL   nRBC 0.0 0.0 - 0.2 %   Neutrophils Relative % 52 %   Neutro Abs 3.8 1.7 - 7.7 K/uL   Lymphocytes Relative 37 %   Lymphs Abs 2.7 0.7 - 4.0 K/uL   Monocytes Relative 9 %   Monocytes Absolute 0.7 0.1 - 1.0 K/uL   Eosinophils Relative 2 %   Eosinophils Absolute 0.1 0.0 - 0.5 K/uL    Basophils Relative 0 %   Basophils Absolute 0.0 0.0 - 0.1 K/uL   Immature Granulocytes 0 %   Abs Immature Granulocytes 0.01 0.00 - 0.07 K/uL    Comment: Performed at [facility], [address] [place], [place] [address]  HM DIABETES EYE EXAM     Status: None   Collection Time: 11/02/19 12:00 AM  Result Value Ref Range   HM Diabetic Eye Exam No Retinopathy No Retinopathy     PHQ2/9: Depression screen [facility] 2/9 12/04/2019 06/26/2019 03/05/2019 12/05/2018 11/03/2018  Decreased Interest 0 0 0 0 0  Down, Depressed, Hopeless 0 0 0 0 0  PHQ - 2 Score 0 0 0 0 0  Altered sleeping 0 0 0 0 0  Tired, decreased energy 0 0 0 0 0  Change in appetite 0 0 0 0 0  Feeling bad or failure about yourself  0 0 0 0 0  Trouble concentrating 0 0 0 0 0  Moving slowly or fidgety/restless 0 0 0 0 0  Suicidal thoughts 0 0 0 0 0  PHQ-9 Score 0 0 0 0 0  Difficult doing work/chores - - - Not difficult at all Not difficult at all    phq 9 is negative   Fall Risk: Fall Risk  12/04/2019 06/26/2019 03/05/2019 12/05/2018 11/03/2018  Falls in the past year? 0 0 0 0 1  Number falls in past yr: 0 0 0 0 0  Injury with Fall? 0 1 0 0 0  Follow up - - - Falls evaluation completed -     Functional Status Survey: Is the patient deaf or have difficulty hearing?: No Does the patient have difficulty seeing, even when wearing glasses/contacts?: No Does the patient have difficulty concentrating, remembering, or making decisions?: No Does the patient have difficulty walking or climbing stairs?: No Does the patient have difficulty dressing or bathing?: No Does the patient have difficulty doing errands alone such as visiting a doctor's office or shopping?: No    Assessment & Plan   1. Morbid obesity, unspecified obesity  type [facility])  Discussed with the patient the risk posed by an increased BMI. Discussed importance of portion control, calorie counting and at least 150 minutes of physical activity weekly. Avoid  sweet beverages and drink more water. Eat at least 6 servings of fruit and vegetables daily   2. Vitamin D deficiency  - Vitamin D, Ergocalciferol, (DRISDOL) 1.25 MG (50000 UNIT) CAPS capsule; Take 1 capsule (50,000 Units total) by mouth every 7 (seven) days.  Dispense: 12 capsule; Refill: 1 - VITAMIN D 25 Hydroxy (Vit-D Deficiency, Fractures)  3. Gastroesophageal reflux disease without esophagitis  Taking pantoprazole   4. Benign essential HTN  - hydrochlorothiazide (HYDRODIURIL) 25 MG tablet; Take 1 tablet (25 mg total) by mouth daily.  Dispense: 90 tablet; Refill: 1 - COMPLETE METABOLIC PANEL WITH GFR  5. Pre-diabetes  - Hemoglobin A1c  6. History of bariatric surgery  She has a history of bariatric surgery, discussed dumping syndrome   7. Dyslipidemia  - rosuvastatin (CRESTOR) 10 MG tablet; Take 1 tablet (10 mg total) by mouth daily.  Dispense: 90 tablet; Refill: 1 - Lipid panel  8. B12 deficiency  Keep   9. Mild intermittent asthma without complication  - albuterol (VENTOLIN HFA) 108 (90 Base) MCG/ACT inhaler; Inhale 2 puffs into the lungs every 6 (six) hours as needed for wheezing or shortness of breath.  Dispense: 18 g; Refill: 0  10. Iron deficiency anemia, unspecified iron deficiency anemia type   11. Trochanteric bursitis of both hips  Discussed foam roll, avoid nsaid's

## 2019-12-04 NOTE — Patient Instructions (Signed)
Hip Bursitis  Hip bursitis is inflammation of a fluid-filled sac (bursa) in the hip joint. The bursa prevents the bones in the hip joint from rubbing against each other. Hip bursitis can cause mild to moderate pain, and symptoms often come and go over time. What are the causes? This condition may be caused by:  Injury to the hip.  Overuse of the muscles that surround the hip joint.  Previous injury or surgery of the hip.  Arthritis or gout.  Diabetes.  Thyroid disease.  Infection. In some cases, the cause may not be known. What are the signs or symptoms? Symptoms of this condition include:  Mild or moderate pain in the hip area. Pain may get worse with movement.  Tenderness and swelling of the hip, especially on the outer side of the hip.  In rare cases, the bursa may become infected. This may cause a fever, as well as warmth and redness in the area. Symptoms may come and go. How is this diagnosed? This condition may be diagnosed based on:  A physical exam.  Your medical history.  X-rays.  Removal of fluid from your inflamed bursa for testing (biopsy). You may be sent to a health care provider who specializes in bone diseases (orthopedist) or a provider who specializes in joint inflammation (rheumatologist). How is this treated? This condition is treated by resting, icing, applying pressure (compression), and raising (elevating) the injured area. This is called RICE treatment. In some cases, this may be enough to make your symptoms go away. Treatment may also include:  Using crutches.  Draining fluid out of the bursa to help relieve swelling.  Injecting medicine that helps to reduce inflammation (cortisone).  Additional medicines if the bursa is infected. Follow these instructions at home: Managing pain, stiffness, and swelling   If directed, put ice on the painful area. ? Put ice in a plastic bag. ? Place a towel between your skin and the bag. ? Leave the ice  on for 20 minutes, 2-3 times a day. ? Raise (elevate) your hip above the level of your heart as much as you can without pain. To do this, try putting a pillow under your hips while you lie down. Activity  Return to your normal activities as told by your health care provider. Ask your health care provider what activities are safe for you.  Rest and protect your hip as much as possible until your pain and swelling get better. General instructions  Take over-the-counter and prescription medicines only as told by your health care provider.  Wear compression wraps only as told by your health care provider.  Do not use your hip to support your body weight until your health care provider says that you can. Use crutches as told by your health care provider.  Gently massage and stretch your injured area as often as is comfortable.  Keep all follow-up visits as told by your health care provider. This is important. How is this prevented?  Exercise regularly, as told by your health care provider.  Warm up and stretch before being active.  Cool down and stretch after being active.  If an activity irritates your hip or causes pain, avoid the activity as much as possible.  Avoid sitting down for long periods at a time. Contact a health care provider if you:  Have a fever.  Develop new symptoms.  Have difficulty walking or doing everyday activities.  Have pain that gets worse or does not get better with medicine.    Develop red skin or a feeling of warmth in your hip area. Get help right away if you:  Cannot move your hip.  Have severe pain. Summary  Hip bursitis is inflammation of a fluid-filled sac (bursa) in the hip joint.  Hip bursitis can cause mild to moderate pain, and symptoms often come and go over time.  This condition is treated with rest, ice, compression, elevation, and medicines. This information is not intended to replace advice given to you by your health care  provider. Make sure you discuss any questions you have with your health care provider. Document Revised: 06/30/2018 Document Reviewed: 06/30/2018 Elsevier Patient Education  2020 [employer] Inc.  

## 2019-12-05 ENCOUNTER — Other Ambulatory Visit: Payer: Self-pay | Admitting: Family Medicine

## 2019-12-05 LAB — COMPLETE METABOLIC PANEL WITH GFR
AG Ratio: 1.8 (calc) (ref 1.0–2.5)
ALT: 11 U/L (ref 6–29)
AST: 16 U/L (ref 10–35)
Albumin: 4.1 g/dL (ref 3.6–5.1)
Alkaline phosphatase (APISO): 96 U/L (ref 37–153)
BUN: 12 mg/dL (ref 7–25)
CO2: 32 mmol/L (ref 20–32)
Calcium: 9.4 mg/dL (ref 8.6–10.4)
Chloride: 105 mmol/L (ref 98–110)
Creat: 0.74 mg/dL (ref 0.50–1.05)
GFR, Est African American: 105 mL/min/{1.73_m2} (ref >=60)
GFR, Est Non African American: 91 mL/min/{1.73_m2} (ref >=60)
Globulin: 2.3 g/dL (calc) (ref 1.9–3.7)
Glucose, Bld: 93 mg/dL (ref 65–99)
Potassium: 4.3 mmol/L (ref 3.5–5.3)
Sodium: 143 mmol/L (ref 135–146)
Total Bilirubin: 0.3 mg/dL (ref 0.2–1.2)
Total Protein: 6.4 g/dL (ref 6.1–8.1)

## 2019-12-05 LAB — LIPID PANEL
Cholesterol: 194 mg/dL (ref <200)
HDL: 75 mg/dL (ref >=50)
LDL Cholesterol (Calc): 98 mg/dL (calc)
Non-HDL Cholesterol (Calc): 119 mg/dL (calc) (ref <130)
Total CHOL/HDL Ratio: 2.6 (calc) (ref <5.0)
Triglycerides: 115 mg/dL (ref <150)

## 2019-12-05 LAB — HEMOGLOBIN A1C
Hgb A1c MFr Bld: 5.6 % of total Hgb (ref <5.7)
Mean Plasma Glucose: 114 (calc)
eAG (mmol/L): 6.3 (calc)

## 2019-12-05 LAB — VITAMIN D 25 HYDROXY (VIT D DEFICIENCY, FRACTURES): Vit D, 25-Hydroxy: 15 ng/mL — ABNORMAL LOW (ref 30–100)

## 2019-12-09 ENCOUNTER — Other Ambulatory Visit: Payer: Self-pay | Admitting: Family Medicine

## 2019-12-09 ENCOUNTER — Encounter: Payer: Self-pay | Admitting: Family Medicine

## 2019-12-09 DIAGNOSIS — R0789 Other chest pain: Secondary | ICD-10-CM

## 2019-12-09 NOTE — Telephone Encounter (Signed)
Copied from CRM [id number]. Topic: General - Inquiry >> Dec 09, 2019  1:47 PM Floria Raveling A wrote: Reason for CRM: pt called back and stated she needs to talk to Dr Carlynn Purl nurse about the info she found out about the weight loss med.  She would not provide any other info Best number [phone] She also needs a refill on her Meloxicam Pharmacy - [facility] in [place] [employer] will not cover Ozempic. What is another option.

## 2019-12-10 ENCOUNTER — Encounter: Payer: Self-pay | Admitting: Gastroenterology

## 2019-12-11 NOTE — Telephone Encounter (Signed)
Insurance will not cover Ozempic. What is another option. That is the question.

## 2019-12-16 ENCOUNTER — Telehealth: Payer: Self-pay | Admitting: Gastroenterology

## 2019-12-16 ENCOUNTER — Other Ambulatory Visit: Payer: Self-pay

## 2019-12-16 ENCOUNTER — Other Ambulatory Visit
Admission: RE | Admit: 2019-12-16 | Discharge: 2019-12-16 | Disposition: A | Discharge status: Home / Self Care | Payer: BC Managed Care – PPO | Source: Ambulatory Visit | Attending: Gastroenterology | Admitting: Gastroenterology

## 2019-12-16 DIAGNOSIS — Z20822 Contact with and (suspected) exposure to covid-19: Secondary | ICD-10-CM | POA: Diagnosis not present

## 2019-12-16 DIAGNOSIS — Z01812 Encounter for preprocedural laboratory examination: Secondary | ICD-10-CM | POA: Diagnosis not present

## 2019-12-16 LAB — SARS CORONAVIRUS 2 (TAT 6-24 HRS): SARS Coronavirus 2: NEGATIVE

## 2019-12-16 MED ORDER — PEG 3350-KCL-NA BICARB-NACL 420 G PO SOLR: 4000.0000 mL | Freq: Once | ORAL | 0 refills | Status: AC

## 2019-12-16 NOTE — Telephone Encounter (Signed)
Patient called & is at the pharmacy now to pick up the colonoscopy prep is $150 and she would like something else called in. Please call [facility] in [place]. Please call patient to let her know it's been called in.

## 2019-12-16 NOTE — Telephone Encounter (Signed)
LVM to let patient know her bowel prep has been changed to [place].  Advised patient that she will need to start drinking at 5pm the evening before her procedure.  Follow mixing instructions.  Drink 8 oz every 30 minutes until she completes the entire contents.  Thanks [place], [place]

## 2019-12-17 NOTE — Discharge Instructions (Signed)
General Anesthesia, Adult, Care After This sheet gives you information about how to care for yourself after your procedure. Your health care provider may also give you more specific instructions. If you have problems or questions, contact your health care provider. What can I expect after the procedure? After the procedure, the following side effects are common:  Pain or discomfort at the IV site.  Nausea.  Vomiting.  Sore throat.  Trouble concentrating.  Feeling cold or chills.  Weak or tired.  Sleepiness and fatigue.  Soreness and body aches. These side effects can affect parts of the body that were not involved in surgery. Follow these instructions at home:  For at least 24 hours after the procedure:  Have a responsible adult stay with you. It is important to have someone help care for you until you are awake and alert.  Rest as needed.  Do not: ? Participate in activities in which you could fall or become injured. ? Drive. ? Use heavy machinery. ? Drink alcohol. ? Take sleeping pills or medicines that cause drowsiness. ? Make important decisions or sign legal documents. ? Take care of children on your own. Eating and drinking  Follow any instructions from your health care provider about eating or drinking restrictions.  When you feel hungry, start by eating small amounts of foods that are soft and easy to digest (bland), such as toast. Gradually return to your regular diet.  Drink enough fluid to keep your urine pale yellow.  If you vomit, rehydrate by drinking water, juice, or clear broth. General instructions  If you have sleep apnea, surgery and certain medicines can increase your risk for breathing problems. Follow instructions from your health care provider about wearing your sleep device: ? Anytime you are sleeping, including during daytime naps. ? While taking prescription pain medicines, sleeping medicines, or medicines that make you drowsy.  Return to  your normal activities as told by your health care provider. Ask your health care provider what activities are safe for you.  Take over-the-counter and prescription medicines only as told by your health care provider.  If you smoke, do not smoke without supervision.  Keep all follow-up visits as told by your health care provider. This is important. Contact a health care provider if:  You have nausea or vomiting that does not get better with medicine.  You cannot eat or drink without vomiting.  You have pain that does not get better with medicine.  You are unable to pass urine.  You develop a skin rash.  You have a fever.  You have redness around your IV site that gets worse. Get help right away if:  You have difficulty breathing.  You have chest pain.  You have blood in your urine or stool, or you vomit blood. Summary  After the procedure, it is common to have a sore throat or nausea. It is also common to feel tired.  Have a responsible adult stay with you for the first 24 hours after general anesthesia. It is important to have someone help care for you until you are awake and alert.  When you feel hungry, start by eating small amounts of foods that are soft and easy to digest (bland), such as toast. Gradually return to your regular diet.  Drink enough fluid to keep your urine pale yellow.  Return to your normal activities as told by your health care provider. Ask your health care provider what activities are safe for you. This information is not   intended to replace advice given to you by your health care provider. Make sure you discuss any questions you have with your health care provider. Document Revised: 10/25/2017 Document Reviewed: 06/07/2017 Elsevier Patient Education  2020 [employer]  

## 2019-12-18 ENCOUNTER — Ambulatory Visit: Payer: BC Managed Care – PPO | Admitting: Anesthesiology

## 2019-12-18 ENCOUNTER — Encounter: Payer: Self-pay | Admitting: Gastroenterology

## 2019-12-18 ENCOUNTER — Encounter
Admission: RE | Disposition: A | Discharge status: Home / Self Care | Payer: Self-pay | Source: Home / Self Care | Attending: Gastroenterology

## 2019-12-18 ENCOUNTER — Other Ambulatory Visit: Payer: Self-pay

## 2019-12-18 ENCOUNTER — Ambulatory Visit
Admission: RE | Admit: 2019-12-18 | Discharge: 2019-12-18 | Disposition: A | Discharge status: Home / Self Care | Payer: BC Managed Care – PPO | Attending: Gastroenterology | Admitting: Gastroenterology

## 2019-12-18 DIAGNOSIS — Z79899 Other long term (current) drug therapy: Secondary | ICD-10-CM | POA: Diagnosis not present

## 2019-12-18 DIAGNOSIS — J45909 Unspecified asthma, uncomplicated: Secondary | ICD-10-CM | POA: Diagnosis not present

## 2019-12-18 DIAGNOSIS — K635 Polyp of colon: Secondary | ICD-10-CM | POA: Insufficient documentation

## 2019-12-18 DIAGNOSIS — I10 Essential (primary) hypertension: Secondary | ICD-10-CM | POA: Diagnosis not present

## 2019-12-18 DIAGNOSIS — K219 Gastro-esophageal reflux disease without esophagitis: Secondary | ICD-10-CM | POA: Insufficient documentation

## 2019-12-18 DIAGNOSIS — Z1211 Encounter for screening for malignant neoplasm of colon: Secondary | ICD-10-CM | POA: Diagnosis not present

## 2019-12-18 HISTORY — PX: COLONOSCOPY WITH PROPOFOL: SHX5780

## 2019-12-18 HISTORY — DX: Presence of spectacles and contact lenses: Z97.3

## 2019-12-18 HISTORY — PX: POLYPECTOMY: SHX5525

## 2019-12-18 SURGERY — COLONOSCOPY WITH PROPOFOL
Anesthesia: General | Site: Rectum

## 2019-12-18 MED ORDER — LIDOCAINE HCL (CARDIAC) PF 100 MG/5ML IV SOSY
PREFILLED_SYRINGE | INTRAVENOUS | Status: DC | PRN
  Administered 2019-12-18: 30 mg via INTRAVENOUS

## 2019-12-18 MED ORDER — ONDANSETRON HCL 4 MG/2ML IJ SOLN
INTRAMUSCULAR | Status: DC | PRN
  Administered 2019-12-18: 4 mg via INTRAVENOUS

## 2019-12-18 MED ORDER — STERILE WATER FOR IRRIGATION IR SOLN
Status: DC | PRN
  Administered 2019-12-18: 09:00:00 .05 mL

## 2019-12-18 MED ORDER — GLYCOPYRROLATE 0.2 MG/ML IJ SOLN
INTRAMUSCULAR | Status: DC | PRN
  Administered 2019-12-18 (×2): .1 mg via INTRAVENOUS

## 2019-12-18 MED ORDER — PROPOFOL 10 MG/ML IV BOLUS
INTRAVENOUS | Status: DC | PRN
  Administered 2019-12-18 (×5): 50 mg via INTRAVENOUS

## 2019-12-18 MED ORDER — LACTATED RINGERS IV SOLN: INTRAVENOUS | Status: DC

## 2019-12-18 SURGICAL SUPPLY — 7 items
CANISTER SUCT 1200ML W/VALVE (MISCELLANEOUS) ×3 IMPLANT
GOWN CVR UNV OPN BCK APRN NK (MISCELLANEOUS) ×4 IMPLANT
GOWN ISOL THUMB LOOP REG UNIV (MISCELLANEOUS) ×2
KIT ENDO PROCEDURE OLY (KITS) ×3 IMPLANT
SNARE COLD EXACTO (MISCELLANEOUS) ×3 IMPLANT
TRAP ETRAP POLY (MISCELLANEOUS) ×3 IMPLANT
WATER STERILE IRR 250ML POUR (IV SOLUTION) ×3 IMPLANT

## 2019-12-18 NOTE — Anesthesia Preprocedure Evaluation (Signed)
Anesthesia Evaluation  Patient identified by MRN, date of birth, ID band Patient awake    History of Anesthesia Complications Negative for: history of anesthetic complications  Airway Mallampati: II  TM Distance: >3 FB Neck ROM: Full    Dental no notable dental hx.    Pulmonary asthma ,    Pulmonary exam normal        Cardiovascular hypertension, Normal cardiovascular exam     Neuro/Psych    GI/Hepatic Neg liver ROS, GERD  Medicated and Controlled,  Endo/Other  negative endocrine ROS  Renal/GU negative Renal ROS     Musculoskeletal   Abdominal   Peds  Hematology  (+) anemia ,   Anesthesia Other Findings   Reproductive/Obstetrics                             Anesthesia Physical Anesthesia Plan  ASA: II  Anesthesia Plan: General   Post-op Pain Management:    Induction:   PONV Risk Score and Plan: 3 and Propofol infusion, TIVA and Treatment may vary due to age or medical condition  Airway Management Planned: Nasal Cannula and Natural Airway  Additional Equipment: None  Intra-op Plan:   Post-operative Plan:   Informed Consent: I have reviewed the patients History and Physical, chart, labs and discussed the procedure including the risks, benefits and alternatives for the proposed anesthesia with the patient or authorized representative who has indicated his/her understanding and acceptance.       Plan Discussed with: CRNA  Anesthesia Plan Comments:         Anesthesia Quick Evaluation

## 2019-12-18 NOTE — H&P (Signed)
Cephas Darby, MD [address]  [place]  [place], [place] [address]  Main: [phone]  Fax: [phone] Pager: [phone]  Primary Care Physician:  Steele Sizer, MD Primary Gastroenterologist:  Dr. Cephas Darby  Pre-Procedure History & Physical: HPI:  Savannah Manning is a 59 y.o. female is here for an colonoscopy.   Past Medical History:  Diagnosis Date  . Anemia   . Arthritis   . Asthma   . GERD (gastroesophageal reflux disease)   . Headache   . Hypertension   . Wears contact lenses     Past Surgical History:  Procedure Laterality Date  . ABDOMINAL HYSTERECTOMY  2012  . CHOLECYSTECTOMY    . DILATION AND CURETTAGE OF UTERUS    . GASTRIC BYPASS  2005  . INDUCED ABORTION N/A 1997   patient was about 22 weeks  . SHOULDER ARTHROSCOPY WITH OPEN ROTATOR CUFF REPAIR Left 06/14/2016   Procedure: SHOULDER ARTHROSCOPY WITH OPEN ROTATOR CUFF REPAIR, DECOMPRESSION, EXCISION LOOSE BODY, DEBRIDEMENT;  Surgeon: Corky Mull, MD;  Location: [facility] ORS;  Service: Orthopedics;  Laterality: Left;    Prior to Admission medications   Medication Sig Start Date End Date Taking? Authorizing Provider  Cyanocobalamin (B-12) 1000 MCG SUBL Place 1 tablet under the tongue daily. 03/05/19  Yes Sowles, Drue Stager, MD  fluticasone (FLONASE) 50 MCG/ACT nasal spray Place 2 sprays into both nostrils daily. 11/03/18  Yes [name], FNP  hydrochlorothiazide (HYDRODIURIL) 25 MG tablet Take 1 tablet (25 mg total) by mouth daily. 12/04/19  Yes Sowles, Drue Stager, MD  Loratadine 10 MG CAPS Take 1 capsule by mouth as needed.   Yes [provider]  nystatin cream (MYCOSTATIN) Apply 1 application topically 2 (two) times daily. 11/05/19  Yes Rod Can, CNM  omeprazole (PRILOSEC) 40 MG capsule TAKE 1 CAPSULE(40 MG) BY MOUTH DAILY 11/11/19  Yes Sowles, Drue Stager, MD  rosuvastatin (CRESTOR) 10 MG tablet Take 1 tablet (10 mg total) by mouth daily. 12/04/19  Yes Sowles, Drue Stager, MD  traZODone  (DESYREL) 50 MG tablet Take 1 tablet (50 mg total) by mouth at bedtime. 05/13/15  Yes Sowles, Drue Stager, MD  Vitamin D, Ergocalciferol, (DRISDOL) 1.25 MG (50000 UNIT) CAPS capsule Take 1 capsule (50,000 Units total) by mouth every 7 (seven) days. 12/04/19  Yes Sowles, Drue Stager, MD  albuterol (VENTOLIN HFA) 108 (90 Base) MCG/ACT inhaler Inhale 2 puffs into the lungs every 6 (six) hours as needed for wheezing or shortness of breath. 12/04/19   Steele Sizer, MD    Allergies as of 11/10/2019 - Review Complete 11/05/2019  Allergen Reaction Noted  . Lisinopril Swelling 05/13/2015  . Ace inhibitors  04/19/2015  . Codeine Swelling 04/19/2015  . Valsartan Swelling 05/13/2015    Family History  Problem Relation Age of Onset  . Hypertension Mother   . Cancer Father        prostate  . Heart disease Brother   . Breast cancer Cousin 62       mat cousin    Social History   Socioeconomic History  . Marital status: Single    Spouse name: Not on file  . Number of children: 1  . Years of education: Not on file  . Highest education level: Not on file  Occupational History  . Not on file  Tobacco Use  . Smoking status: Never Smoker  . Smokeless tobacco: Never Used  Substance and Sexual Activity  . Alcohol use: No    Alcohol/week: 0.0 standard drinks  .  Drug use: No  . Sexual activity: Not Currently  Other Topics Concern  . Not on file  Social History Narrative   Desk job/works for insurance; no smoking; no alcohol; lives in [place].    Social Determinants of Health   Financial Resource Strain:   . Difficulty of Paying Living Expenses: Not on file  Food Insecurity:   . Worried About Programme researcher, broadcasting/film/video in the Last Year: Not on file  . Ran Out of Food in the Last Year: Not on file  Transportation Needs:   . Lack of Transportation (Medical): Not on file  . Lack of Transportation (Non-Medical): Not on file  Physical Activity:   . Days of Exercise per Week: Not on file  . Minutes of  Exercise per Session: Not on file  Stress:   . Feeling of Stress : Not on file  Social Connections:   . Frequency of Communication with Friends and Family: Not on file  . Frequency of Social Gatherings with Friends and Family: Not on file  . Attends Religious Services: Not on file  . Active Member of Clubs or Organizations: Not on file  . Attends Banker Meetings: Not on file  . Marital Status: Not on file  Intimate Partner Violence:   . Fear of Current or Ex-Partner: Not on file  . Emotionally Abused: Not on file  . Physically Abused: Not on file  . Sexually Abused: Not on file    Review of Systems: See HPI, otherwise negative ROS  Physical Exam: BP (!) 156/92   Pulse 80   Temp (!) 97.3 F (36.3 C) (Temporal)   Ht 5\' 7"  (1.702 m)   Wt 110.7 kg   SpO2 100%   BMI 38.22 kg/m  General:   Alert,  pleasant and cooperative in NAD Head:  Normocephalic and atraumatic. Neck:  Supple; no masses or thyromegaly. Lungs:  Clear throughout to auscultation.    Heart:  Regular rate and rhythm. Abdomen:  Soft, nontender and nondistended. Normal bowel sounds, without guarding, and without rebound.   Neurologic:  Alert and  oriented x4;  grossly normal neurologically.  Impression/Plan: Savannah Manning is here for an colonoscopy to be performed for colon cancer screening  Risks, benefits, limitations, and alternatives regarding  colonoscopy have been reviewed with the patient.  Questions have been answered.  All parties agreeable.   [name], MD  12/18/2019, 7:53 AM

## 2019-12-18 NOTE — Anesthesia Postprocedure Evaluation (Signed)
Anesthesia Post Note  Patient: Savannah Manning  Procedure(s) Performed: COLONOSCOPY WITH PROPOFOL (N/A ) POLYPECTOMY (Rectum)     Patient location during evaluation: PACU Anesthesia Type: General Level of consciousness: awake and alert Pain management: pain level controlled Vital Signs Assessment: post-procedure vital signs reviewed and stable Respiratory status: spontaneous breathing, nonlabored ventilation, respiratory function stable and patient connected to nasal cannula oxygen Cardiovascular status: blood pressure returned to baseline and stable Postop Assessment: no apparent nausea or vomiting Anesthetic complications: no    Wille Celeste Mathew Postiglione

## 2019-12-18 NOTE — Op Note (Signed)
[facility] Gastroenterology Patient Name: Savannah Manning Procedure Date: 12/18/2019 8:46 AM MRN: [id number] Account #: [id number] Date of Birth: 1960/12/29 Admit Type: Outpatient Age: 59 Room: [facility] OR ROOM 01 Gender: Female Note Status: Finalized Procedure:             Colonoscopy Indications:           Screening for colorectal malignant neoplasm Providers:             Lin Landsman MD, MD Referring MD:          Bethena Roys. Sowles, MD (Referring MD) Medicines:             Monitored Anesthesia Care Complications:         No immediate complications. Estimated blood loss: None. Procedure:             Pre-Anesthesia Assessment:                        - Prior to the procedure, a History and Physical was                         performed, and patient medications and allergies were                         reviewed. The patient is competent. The risks and                         benefits of the procedure and the sedation options and                         risks were discussed with the patient. All questions                         were answered and informed consent was obtained.                         Patient identification and proposed procedure were                         verified by the physician, the nurse, the                         anesthesiologist, the anesthetist and the technician                         in the pre-procedure area in the procedure room in the                         endoscopy suite. Mental Status Examination: alert and                         oriented. Airway Examination: normal oropharyngeal                         airway and neck mobility. Respiratory Examination:                         clear to auscultation. CV Examination: normal.  Prophylactic Antibiotics: The patient does not require                         prophylactic antibiotics. Prior Anticoagulants: The                         patient has taken no previous  anticoagulant or                         antiplatelet agents. ASA Grade Assessment: II - A                         patient with mild systemic disease. After reviewing                         the risks and benefits, the patient was deemed in                         satisfactory condition to undergo the procedure. The                         anesthesia plan was to use monitored anesthesia care                         (MAC). Immediately prior to administration of                         medications, the patient was re-assessed for adequacy                         to receive sedatives. The heart rate, respiratory                         rate, oxygen saturations, blood pressure, adequacy of                         pulmonary ventilation, and response to care were                         monitored throughout the procedure. The physical                         status of the patient was re-assessed after the                         procedure.                        After obtaining informed consent, the colonoscope was                         passed under direct vision. Throughout the procedure,                         the patient's blood pressure, pulse, and oxygen                         saturations were monitored continuously. The was  introduced through the anus and advanced to the the                         cecum, identified by appendiceal orifice and ileocecal                         valve. The colonoscopy was performed with moderate                         difficulty due to significant looping and the                         patient's body habitus. Successful completion of the                         procedure was aided by applying abdominal pressure.                         The patient tolerated the procedure well. The quality                         of the bowel preparation was evaluated using the BBPS                         [facility] Bowel Preparation Scale) with  scores of: Right                         Colon = 3, Transverse Colon = 3 and Left Colon = 3                         (entire mucosa seen well with no residual staining,                         small fragments of stool or opaque liquid). The total                         BBPS score equals 9. Findings:      The perianal and digital rectal examinations were normal. Pertinent       negatives include normal sphincter tone and no palpable rectal lesions.      A 3 mm polyp was found in the transverse colon. The polyp was sessile.       The polyp was removed with a cold snare. Resection was complete, but the       polyp tissue was not retrieved.      The retroflexed view of the distal rectum and anal verge was normal and       showed no anal or rectal abnormalities.      The exam was otherwise without abnormality. Impression:            - One 3 mm polyp in the transverse colon, removed with                         a cold snare. Complete resection. Polyp tissue not                         retrieved.                        -  The distal rectum and anal verge are normal on                         retroflexion view.                        - The examination was otherwise normal. Recommendation:        - Discharge patient to home (with escort).                        - Resume previous diet today.                        - Continue present medications.                        - Repeat colonoscopy in 7 years for surveillance. Procedure Code(s):     --- Professional ---                        [phone], Colonoscopy, flexible; with removal of                         tumor(s), polyp(s), or other lesion(s) by snare                         technique Diagnosis Code(s):     --- Professional ---                        Z12.11, Encounter for screening for malignant neoplasm                         of colon                        K63.5, Polyp of colon CPT copyright 2019 American Medical Association. All rights  reserved. The codes documented in this report are preliminary and upon coder review may  be revised to meet current compliance requirements. Dr. Ulyess Mort Lin Landsman MD, MD 12/18/2019 9:19:20 AM This report has been signed electronically. Number of Addenda: 0 Note Initiated On: 12/18/2019 8:46 AM Scope Withdrawal Time: 0 hours 9 minutes 35 seconds  Total Procedure Duration: 0 hours 16 minutes 55 seconds  Estimated Blood Loss:  Estimated blood loss: none.      [facility]

## 2019-12-18 NOTE — OR Nursing (Signed)
Transverse colon polyp taken with cold snare. Polyp not able to be retrieved.

## 2019-12-18 NOTE — Transfer of Care (Signed)
Immediate Anesthesia Transfer of Care Note  Patient: Savannah Manning  Procedure(s) Performed: COLONOSCOPY WITH PROPOFOL (N/A ) POLYPECTOMY (Rectum)  Patient Location: PACU  Anesthesia Type: General  Level of Consciousness: awake, alert  and patient cooperative  Airway and Oxygen Therapy: Patient Spontanous Breathing and Patient connected to supplemental oxygen  Post-op Assessment: Post-op Vital signs reviewed, Patient's Cardiovascular Status Stable, Respiratory Function Stable, Patent Airway and No signs of Nausea or vomiting  Post-op Vital Signs: Reviewed and stable  Complications: No apparent anesthesia complications

## 2019-12-18 NOTE — Anesthesia Procedure Notes (Signed)
Procedure Name: MAC Date/Time: 12/18/2019 8:56 AM Performed by: Georga Bora, CRNA Pre-anesthesia Checklist: Emergency Drugs available, Patient identified, Suction available, Patient being monitored and Timeout performed Patient Re-evaluated:Patient Re-evaluated prior to induction Oxygen Delivery Method: Nasal cannula Preoxygenation: Pre-oxygenation with 100% oxygen Induction Type: IV induction

## 2019-12-21 ENCOUNTER — Encounter: Payer: Self-pay | Admitting: *Deleted

## 2019-12-28 ENCOUNTER — Ambulatory Visit: Payer: BC Managed Care – PPO

## 2019-12-28 ENCOUNTER — Other Ambulatory Visit: Payer: Self-pay

## 2019-12-28 ENCOUNTER — Inpatient Hospital Stay: Payer: BC Managed Care – PPO

## 2019-12-28 VITALS — Temp 97.9°F

## 2019-12-28 DIAGNOSIS — Z803 Family history of malignant neoplasm of breast: Secondary | ICD-10-CM | POA: Diagnosis not present

## 2019-12-28 DIAGNOSIS — I10 Essential (primary) hypertension: Secondary | ICD-10-CM | POA: Insufficient documentation

## 2019-12-28 DIAGNOSIS — Z79899 Other long term (current) drug therapy: Secondary | ICD-10-CM | POA: Diagnosis not present

## 2019-12-28 DIAGNOSIS — Z9884 Bariatric surgery status: Secondary | ICD-10-CM | POA: Insufficient documentation

## 2019-12-28 DIAGNOSIS — D509 Iron deficiency anemia, unspecified: Secondary | ICD-10-CM

## 2019-12-28 DIAGNOSIS — Z8249 Family history of ischemic heart disease and other diseases of the circulatory system: Secondary | ICD-10-CM | POA: Diagnosis not present

## 2019-12-28 DIAGNOSIS — Z90710 Acquired absence of both cervix and uterus: Secondary | ICD-10-CM | POA: Diagnosis not present

## 2019-12-28 DIAGNOSIS — E538 Deficiency of other specified B group vitamins: Secondary | ICD-10-CM | POA: Diagnosis not present

## 2019-12-28 DIAGNOSIS — K9589 Other complications of other bariatric procedure: Secondary | ICD-10-CM

## 2019-12-28 DIAGNOSIS — J45909 Unspecified asthma, uncomplicated: Secondary | ICD-10-CM | POA: Insufficient documentation

## 2019-12-28 DIAGNOSIS — Z8042 Family history of malignant neoplasm of prostate: Secondary | ICD-10-CM | POA: Insufficient documentation

## 2019-12-28 LAB — CBC WITH DIFFERENTIAL/PLATELET
Abs Immature Granulocytes: 0.02 10*3/uL (ref 0.00–0.07)
Basophils Absolute: 0 10*3/uL (ref 0.0–0.1)
Basophils Relative: 0 %
Eosinophils Absolute: 0.2 10*3/uL (ref 0.0–0.5)
Eosinophils Relative: 3 %
HCT: 38.6 % (ref 36.0–46.0)
Hemoglobin: 13.3 g/dL (ref 12.0–15.0)
Immature Granulocytes: 0 %
Lymphocytes Relative: 41 %
Lymphs Abs: 3 10*3/uL (ref 0.7–4.0)
MCH: 30.3 pg (ref 26.0–34.0)
MCHC: 34.5 g/dL (ref 30.0–36.0)
MCV: 87.9 fL (ref 80.0–100.0)
Monocytes Absolute: 0.6 10*3/uL (ref 0.1–1.0)
Monocytes Relative: 8 %
Neutro Abs: 3.4 10*3/uL (ref 1.7–7.7)
Neutrophils Relative %: 48 %
Platelets: 255 10*3/uL (ref 150–400)
RBC: 4.39 MIL/uL (ref 3.87–5.11)
RDW: 12.7 % (ref 11.5–15.5)
WBC: 7.2 10*3/uL (ref 4.0–10.5)
nRBC: 0 % (ref 0.0–0.2)

## 2019-12-28 LAB — FERRITIN: Ferritin: 49 ng/mL (ref 11–307)

## 2019-12-28 MED ORDER — CYANOCOBALAMIN 1000 MCG/ML IJ SOLN
1000.0000 ug | Freq: Once | INTRAMUSCULAR | Status: AC
  Administered 2019-12-28: 1000 ug via INTRAMUSCULAR

## 2019-12-28 NOTE — Progress Notes (Signed)
[facility]  [address], [address] [place], [place] [address] Phone: [phone]  Fax: [phone]   Clinic Day:  12/30/2019  Referring physician: Alba Cory, MD  Chief Complaint: Savannah Manning is a 59 y.o. female with s/p gastric bypass surgery with iron deficiency anemia and B12 deficiency who is seen for a 5 month assessment.   HPI: The patient was last seen in the hematology clinic on 07/29/2019 for new patient assessment.  At that time, she felt fatigued. She did not sleep well at night; she felt tired in the morning. Exam was unremarkable.  Labs revealed a hematocrit 37.4, hemoglobin 12.9, MCV 89.0, platelets 237,000, WBC 4,900.  Ferritin was 70 with an iron saturation of 29% and a TIBC of 409. CMP was normal. We discussed IV iron when ferritin < 30.  She received B12 monthly x 4 (07/29/2019 - 12/28/2019).   She was seen by her PCP on 12/04/2019. She reported hip pain x 2 months. Pain worsened at night. Climbing was difficult for her. She had abdominal pain and chills after eating. She notes after eating she had to go to the bathroom. She was not able to take NSAIDs. She was told that she had bursitis. Recommended exercise.   Colonoscopy on 12/18/2019 with Dr. Allegra Lai revealed a one 3 mm polyp in the transverse colon, removed with a cold snare.  Polyp tissue not was retrieved. The distal rectum and anal verge were normal on retroflexion view. The examination was otherwise normal.  Labs on 12/28/2019 included hematocrit 38.6, hemoglobin 13.3, MCV 87.9, platelets 255,000, WBC 7,200. Ferritin was 49.   During the interim, she has felt "great". Rolling hip exercises increased her hip pain so she stopped that exercise. She notes that it made her hip tender. She notes bad cramps at night. She denies any ice pica.  She denies any bleeding.  Bilateral mammogram on 11/05/2019 showed no evidence of malignancy.    Past Medical History:  Diagnosis Date  .  Anemia   . Arthritis   . Asthma   . GERD (gastroesophageal reflux disease)   . Headache   . Hypertension   . Wears contact lenses     Past Surgical History:  Procedure Laterality Date  . ABDOMINAL HYSTERECTOMY  2012  . CHOLECYSTECTOMY    . COLONOSCOPY WITH PROPOFOL N/A 12/18/2019   Procedure: COLONOSCOPY WITH PROPOFOL;  Surgeon: Toney Reil, MD;  Location: [facility];  Service: Gastroenterology;  Laterality: N/A;  . DILATION AND CURETTAGE OF UTERUS    . GASTRIC BYPASS  2005  . INDUCED ABORTION N/A 1997   patient was about 22 weeks  . POLYPECTOMY  12/18/2019   Procedure: POLYPECTOMY;  Surgeon: Toney Reil, MD;  Location: [facility];  Service: Gastroenterology;;  . SHOULDER ARTHROSCOPY WITH OPEN ROTATOR CUFF REPAIR Left 06/14/2016   Procedure: SHOULDER ARTHROSCOPY WITH OPEN ROTATOR CUFF REPAIR, DECOMPRESSION, EXCISION LOOSE BODY, DEBRIDEMENT;  Surgeon: Christena Flake, MD;  Location: [facility];  Service: Orthopedics;  Laterality: Left;    Family History  Problem Relation Age of Onset  . Hypertension Mother   . Cancer Father        prostate  . Heart disease Brother   . Breast cancer Cousin 40       mat cousin    Social History:  reports that she has never smoked. She has never used smokeless tobacco. She reports that she does not drink alcohol or use drugs. 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  She lives in [place]. The patient is alone today.  Allergies:  Allergies  Allergen Reactions  . Lisinopril Swelling    Facial Swelling  . Ace Inhibitors     angioedema  . Codeine Swelling  . Valsartan Swelling    Current Medications: Current Outpatient Medications  Medication Sig Dispense Refill  . albuterol (VENTOLIN HFA) 108 (90 Base) MCG/ACT inhaler Inhale 2 puffs into the lungs every 6 (six) hours as needed for wheezing or shortness of breath. 18 g 0  . Cyanocobalamin (B-12) 1000 MCG SUBL Place 1 tablet under the tongue daily. 30  each 5  . fluticasone (FLONASE) 50 MCG/ACT nasal spray Place 2 sprays into both nostrils daily. 16 g 0  . hydrochlorothiazide (HYDRODIURIL) 25 MG tablet Take 1 tablet (25 mg total) by mouth daily. 90 tablet 1  . Loratadine 10 MG CAPS Take 1 capsule by mouth as needed.    [name] omeprazole (PRILOSEC) 40 MG capsule TAKE 1 CAPSULE(40 MG) BY MOUTH DAILY 90 capsule 0  . rosuvastatin (CRESTOR) 10 MG tablet Take 1 tablet (10 mg total) by mouth daily. 90 tablet 1  . Vitamin D, Ergocalciferol, (DRISDOL) 1.25 MG (50000 UNIT) CAPS capsule Take 1 capsule (50,000 Units total) by mouth every 7 (seven) days. 12 capsule 1  . nystatin cream (MYCOSTATIN) Apply 1 application topically 2 (two) times daily. (Patient not taking: Reported on 12/30/2019) 30 g 5  . traZODone (DESYREL) 50 MG tablet Take 1 tablet (50 mg total) by mouth at bedtime. (Patient not taking: Reported on 12/30/2019) 90 tablet 1   No current facility-administered medications for this visit.    Review of Systems  Constitutional: Negative for chills, diaphoresis, fever, malaise/fatigue and weight loss.       Feels "great".  HENT: Negative for congestion, ear pain, nosebleeds, sinus pain and sore throat.   Eyes: Negative.  Negative for blurred vision, double vision and photophobia.  Respiratory: Negative.  Negative for cough, hemoptysis, sputum production, shortness of breath and wheezing.   Cardiovascular: Negative.  Negative for chest pain, palpitations, orthopnea and leg swelling.  Gastrointestinal: Negative for abdominal pain, blood in stool, constipation, diarrhea (due to Ozempic), heartburn, melena, nausea and vomiting.       Last colonoscopy 12/18/2019.  Genitourinary: Negative.  Negative for dysuria, frequency and urgency.  Musculoskeletal: Positive for joint pain (hips) and myalgias (leg cramps at night). Negative for back pain and neck pain.       Bursitis in hips.  Skin: Negative.  Negative for itching and rash.  Neurological: Negative.   Negative for dizziness, tingling, sensory change, speech change, focal weakness, weakness and headaches.  Endo/Heme/Allergies: Negative.  Does not bruise/bleed easily.  Psychiatric/Behavioral: Negative for depression. The patient is not nervous/anxious and does not have insomnia.   All other systems reviewed and are negative.  Performance status (ECOG): 1  Vitals Pulse (!) 56, temperature (!) 95.3 F (35.2 C), temperature source Tympanic, resp. rate 18, height 5\' 7"  (1.702 m), weight 248 lb 12.6 oz (112.9 kg), SpO2 100 %.   Physical Exam  Constitutional: She is oriented to person, place, and time. She appears well-developed and well-nourished. No distress. Face mask in place.  HENT:  Head: Normocephalic and atraumatic.  Mouth/Throat: Oropharynx is clear and moist. No oropharyngeal exudate.  Brown hair pulled up.  Eyes: Pupils are equal, round, and reactive to light. Conjunctivae and EOM are normal. No scleral icterus.  Glasses.  Brown eyes.  Neck: No JVD present.  Cardiovascular: Normal rate and regular rhythm. Exam reveals no gallop.  No murmur heard. Pulmonary/Chest: Effort normal and breath sounds normal. She has no wheezes. She has no rales. She exhibits no tenderness.  Abdominal: Soft. Bowel sounds are normal. She exhibits no mass. There is no abdominal tenderness. There is no rebound and no guarding. No hernia.  Musculoskeletal:        General: No edema. Normal range of motion.     Cervical back: Normal range of motion and neck supple.  Lymphadenopathy:       Head (right side): No preauricular, no posterior auricular and no occipital adenopathy present.       Head (left side): No preauricular, no posterior auricular and no occipital adenopathy present.    She has no cervical adenopathy.    She has no axillary adenopathy.       Right: No inguinal and no supraclavicular adenopathy present.       Left: No inguinal and no supraclavicular adenopathy present.  Neurological: She is  alert and oriented to person, place, and time.  Skin: Skin is warm and dry. No rash noted. She is not diaphoretic. No erythema. No pallor.  Psychiatric: She has a normal mood and affect. Her behavior is normal. Judgment and thought content normal.  Nursing note and vitals reviewed.   Appointment on 12/28/2019  Component Date Value Ref Range Status  . Ferritin 12/28/2019 49  11 - 307 ng/mL Final   Performed at [facility], [address] [place] [place], [place] [address]  . WBC 12/28/2019 7.2  4.0 - 10.5 K/uL Final  . RBC 12/28/2019 4.39  3.87 - 5.11 MIL/uL Final  . Hemoglobin 12/28/2019 13.3  12.0 - 15.0 g/dL Final  . HCT 14/97/0263 38.6  36.0 - 46.0 % Final  . MCV 12/28/2019 87.9  80.0 - 100.0 fL Final  . MCH 12/28/2019 30.3  26.0 - 34.0 pg Final  . MCHC 12/28/2019 34.5  30.0 - 36.0 g/dL Final  . RDW 78/58/8502 12.7  11.5 - 15.5 % Final  . Platelets 12/28/2019 255  150 - 400 K/uL Final  . nRBC 12/28/2019 0.0  0.0 - 0.2 % Final  . Neutrophils Relative % 12/28/2019 48  % Final  . Neutro Abs 12/28/2019 3.4  1.7 - 7.7 K/uL Final  . Lymphocytes Relative 12/28/2019 41  % Final  . Lymphs Abs 12/28/2019 3.0  0.7 - 4.0 K/uL Final  . Monocytes Relative 12/28/2019 8  % Final  . Monocytes Absolute 12/28/2019 0.6  0.1 - 1.0 K/uL Final  . Eosinophils Relative 12/28/2019 3  % Final  . Eosinophils Absolute 12/28/2019 0.2  0.0 - 0.5 K/uL Final  . Basophils Relative 12/28/2019 0  % Final  . Basophils Absolute 12/28/2019 0.0  0.0 - 0.1 K/uL Final  . Immature Granulocytes 12/28/2019 0  % Final  . Abs Immature Granulocytes 12/28/2019 0.02  0.00 - 0.07 K/uL Final   Performed at [facility], [address] [place], [place] [address]    Assessment:  Savannah Manning is a 59 y.o. female s/p gastric bypass surgery (2005) with iron deficiency anemia and B12 deficiency.    She is s/p hysterectomy in 2012.  Last colonoscopy was on 08/10/2014.  She has received Feraheme: 11/11/2018 and  11/18/2018.  Ferritin has been followed: 8 on 10/12/2016, 11 on 05/01/2017, 7 on 11/03/2018, 173 on 01/16/2019, 70 on 07/24/2019, 65 on 10/28/2019, and 49 on 12/28/2019.   She receives Venofer if her ferritin  is < 30.  She receives B12 monthly (last 12/28/2019).  She missed some B12 injections during COVID-19.  Folate was 8.6 on 10/28/2019.  Symptomatically, she feels "great".  She denies any bleeding.  Plan: 1.   Review labs from 12/29/2019. 2.   Iron deficiency anemia             Hematocrit 38.6.  Hemoglobin 13.3.  MCV 87.9.             Ferritin 49.  No Venofer today.            Continue to monitor. 3.   B12 deficiency             Last B12 was on 12/28/2019.  Continue B12 monthly x 6. 4.   RTC in 3 months for labs (CBC, ferritin) and B12. 5.   RTC in 6 months for MD assessment, labs (CBC with diff, ferritin, iron studies- day before), B12 and +/- Feraheme.  I discussed the assessment and treatment plan with the patient.  The patient was provided an opportunity to ask questions and all were answered.  The patient agreed with the plan and demonstrated an understanding of the instructions.  The patient was advised to call back if the symptoms worsen or if the condition fails to improve as anticipated.    Lequita Asal, MD, PhD    12/30/2019, 10:17 AM  I, Selena Batten, am acting as scribe for [employer]. Mike Gip, MD, PhD.  I, Shenia Alan C. Mike Gip, MD, have reviewed the above documentation for accuracy and completeness, and I agree with the above.

## 2019-12-29 ENCOUNTER — Other Ambulatory Visit: Payer: BC Managed Care – PPO

## 2019-12-30 ENCOUNTER — Other Ambulatory Visit: Payer: Self-pay

## 2019-12-30 ENCOUNTER — Ambulatory Visit: Payer: BC Managed Care – PPO

## 2019-12-30 ENCOUNTER — Inpatient Hospital Stay: Payer: BC Managed Care – PPO

## 2019-12-30 ENCOUNTER — Inpatient Hospital Stay: Payer: BC Managed Care – PPO | Attending: Hematology and Oncology | Admitting: Hematology and Oncology

## 2019-12-30 ENCOUNTER — Encounter: Payer: Self-pay | Admitting: Hematology and Oncology

## 2019-12-30 VITALS — BP 148/99 | HR 56 | Temp 95.3°F | Resp 18 | Ht 67.0 in | Wt 248.8 lb

## 2019-12-30 DIAGNOSIS — D509 Iron deficiency anemia, unspecified: Secondary | ICD-10-CM | POA: Diagnosis not present

## 2019-12-30 DIAGNOSIS — I10 Essential (primary) hypertension: Secondary | ICD-10-CM | POA: Diagnosis not present

## 2019-12-30 DIAGNOSIS — E538 Deficiency of other specified B group vitamins: Secondary | ICD-10-CM | POA: Diagnosis not present

## 2019-12-30 DIAGNOSIS — Z8249 Family history of ischemic heart disease and other diseases of the circulatory system: Secondary | ICD-10-CM | POA: Diagnosis not present

## 2019-12-30 DIAGNOSIS — Z9884 Bariatric surgery status: Secondary | ICD-10-CM | POA: Diagnosis not present

## 2019-12-30 DIAGNOSIS — Z8042 Family history of malignant neoplasm of prostate: Secondary | ICD-10-CM | POA: Diagnosis not present

## 2019-12-30 DIAGNOSIS — Z90710 Acquired absence of both cervix and uterus: Secondary | ICD-10-CM | POA: Diagnosis not present

## 2019-12-30 DIAGNOSIS — J45909 Unspecified asthma, uncomplicated: Secondary | ICD-10-CM | POA: Diagnosis not present

## 2019-12-30 DIAGNOSIS — Z803 Family history of malignant neoplasm of breast: Secondary | ICD-10-CM | POA: Diagnosis not present

## 2019-12-30 DIAGNOSIS — Z79899 Other long term (current) drug therapy: Secondary | ICD-10-CM | POA: Diagnosis not present

## 2019-12-30 DIAGNOSIS — K9589 Other complications of other bariatric procedure: Secondary | ICD-10-CM

## 2019-12-30 NOTE — Progress Notes (Signed)
No new changes noted today 

## 2020-01-01 ENCOUNTER — Ambulatory Visit: Payer: Self-pay

## 2020-01-01 ENCOUNTER — Encounter: Payer: Self-pay | Admitting: Family Medicine

## 2020-01-04 ENCOUNTER — Other Ambulatory Visit: Payer: Self-pay | Admitting: Family Medicine

## 2020-01-04 MED ORDER — SAXENDA 18 MG/3ML ~~LOC~~ SOPN: 3.0000 mg | PEN_INJECTOR | Freq: Every day | SUBCUTANEOUS | 2 refills | Status: DC

## 2020-01-04 MED ORDER — NOVOFINE 32G X 6 MM MISC: 1.0000 | Freq: Every day | 2 refills | Status: DC

## 2020-01-15 ENCOUNTER — Ambulatory Visit: Payer: BC Managed Care – PPO | Attending: Internal Medicine

## 2020-01-15 DIAGNOSIS — Z23 Encounter for immunization: Secondary | ICD-10-CM

## 2020-01-15 NOTE — Progress Notes (Signed)
   Covid-19 Vaccination Clinic  Name:  Savannah Manning    MRN: [id number] DOB: [birth year]  01/15/2020  Savannah Manning was observed post Covid-19 immunization for 15 minutes without incident. She was provided with Vaccine Information Sheet and instruction to access the V-Safe system.   Savannah Manning was instructed to call 911 with any severe reactions post vaccine: [name] Difficulty breathing  . Swelling of face and throat  . A fast heartbeat  . A bad rash all over body  . Dizziness and weakness   Immunizations Administered    Name Date Dose VIS Date Route   Moderna COVID-19 Vaccine 01/15/2020 10:05 AM 0.5 mL 10/06/2019 Intramuscular   Manufacturer: Moderna   Lot: [id number]   NDC: 47425-956-38

## 2020-01-19 ENCOUNTER — Other Ambulatory Visit: Payer: Self-pay | Admitting: Family Medicine

## 2020-01-19 DIAGNOSIS — J4520 Mild intermittent asthma, uncomplicated: Secondary | ICD-10-CM

## 2020-01-19 NOTE — Telephone Encounter (Signed)
Requested Prescriptions  Pending Prescriptions Disp Refills  . VENTOLIN HFA 108 (90 Base) MCG/ACT inhaler [Pharmacy Med Name: VENTOLIN HFA INH W/DOS CTR 200PUFFS] 18 g 0    Sig: INHALE 2 PUFFS INTO THE LUNGS EVERY 6 HOURS AS NEEDED FOR WHEEZING OR SHORTNESS OF BREATH     Pulmonology:  Beta Agonists Failed - 01/19/2020 10:01 AM      Failed - One inhaler should last at least one month. If the patient is requesting refills earlier, contact the patient to check for uncontrolled symptoms.      Passed - Valid encounter within last 12 months    Recent Outpatient Visits          1 month ago Morbid obesity, unspecified obesity type [facility])   [facility] Alba Cory, MD   6 months ago Pain, chest wall   [facility] Alba Cory, MD   10 months ago Morbid obesity, unspecified obesity type [facility])   [facility] Alba Cory, MD   1 year ago Morbid obesity, unspecified obesity type [facility])   [facility] Doren Custard, FNP   1 year ago Lightheadedness   [facility] Doren Custard, [place]      Future Appointments            In 4 months Alba Cory, MD [facility], [facility]

## 2020-01-20 ENCOUNTER — Encounter: Payer: Self-pay | Admitting: Family Medicine

## 2020-01-21 ENCOUNTER — Encounter: Payer: Self-pay | Admitting: Family Medicine

## 2020-01-27 ENCOUNTER — Other Ambulatory Visit: Payer: Self-pay

## 2020-01-27 ENCOUNTER — Inpatient Hospital Stay: Payer: BC Managed Care – PPO | Attending: Hematology and Oncology

## 2020-01-27 VITALS — BP 138/90 | HR 66 | Temp 97.0°F | Resp 18

## 2020-01-27 DIAGNOSIS — K909 Intestinal malabsorption, unspecified: Secondary | ICD-10-CM

## 2020-01-27 DIAGNOSIS — E538 Deficiency of other specified B group vitamins: Secondary | ICD-10-CM | POA: Insufficient documentation

## 2020-01-27 MED ORDER — CYANOCOBALAMIN 1000 MCG/ML IJ SOLN
1000.0000 ug | Freq: Once | INTRAMUSCULAR | Status: AC
  Administered 2020-01-27: 1000 ug via INTRAMUSCULAR

## 2020-01-27 NOTE — Patient Instructions (Signed)

## 2020-02-01 ENCOUNTER — Encounter: Payer: Self-pay | Admitting: Family Medicine

## 2020-02-02 ENCOUNTER — Telehealth: Payer: Self-pay | Admitting: Family Medicine

## 2020-02-02 NOTE — Telephone Encounter (Signed)
Copied from CRM [id number]. Topic: General - Other >> Feb 02, 2020  2:12 PM Tamela Oddi wrote: Reason for CRM: Patient called to ask the nurse or doctor to call regarding the referral that was sent for a gastroenterologist.  Patient stated that she needs a specialist for a hernia.  Patient would like to be referred to a specialist that would handle her hernia.  Please advise and call patient to discuss at [phone]

## 2020-02-02 NOTE — Telephone Encounter (Signed)
I do not see where any GI referral has been placed.  Please place or for GI referral

## 2020-02-14 ENCOUNTER — Other Ambulatory Visit: Payer: Self-pay | Admitting: Family Medicine

## 2020-02-14 DIAGNOSIS — J4520 Mild intermittent asthma, uncomplicated: Secondary | ICD-10-CM

## 2020-02-14 NOTE — Telephone Encounter (Signed)
Requested Prescriptions  Pending Prescriptions Disp Refills  . VENTOLIN HFA 108 (90 Base) MCG/ACT inhaler [Pharmacy Med Name: VENTOLIN HFA INH W/DOS CTR 200PUFFS] 18 g 0    Sig: INHALE 2 PUFFS INTO THE LUNGS EVERY 6 HOURS AS NEEDED FOR WHEEZING OR SHORTNESS OF BREATH     Pulmonology:  Beta Agonists Failed - 02/14/2020  8:03 AM      Failed - One inhaler should last at least one month. If the patient is requesting refills earlier, contact the patient to check for uncontrolled symptoms.      Passed - Valid encounter within last 12 months    Recent Outpatient Visits          2 months ago Morbid obesity, unspecified obesity type [facility])   [facility] Alba Cory, MD   7 months ago Pain, chest wall   [facility] Alba Cory, MD   11 months ago Morbid obesity, unspecified obesity type [facility])   [facility] Alba Cory, MD   1 year ago Morbid obesity, unspecified obesity type [facility])   [facility] Doren Custard, FNP   1 year ago Lightheadedness   [facility], Gerome A[place], [place]      Future Appointments            In 1 week Alba Cory, MD [facility]   In 3 months Alba Cory, MD [facility], [facility]

## 2020-02-16 ENCOUNTER — Ambulatory Visit: Payer: BC Managed Care – PPO | Attending: Internal Medicine

## 2020-02-16 DIAGNOSIS — Z23 Encounter for immunization: Secondary | ICD-10-CM

## 2020-02-16 NOTE — Progress Notes (Signed)
   Covid-19 Vaccination Clinic  Name:  Savannah Manning    MRN: [id number] DOB: Jan 10, 1961  02/16/2020  [name] was observed post Covid-19 immunization for 15 minutes without incident. She was provided with Vaccine Information Sheet and instruction to access the V-Safe system.   [name] was instructed to call 911 with any severe reactions post vaccine: [name] Difficulty breathing  . Swelling of face and throat  . A fast heartbeat  . A bad rash all over body  . Dizziness and weakness   Immunizations Administered    Name Date Dose VIS Date Route   Moderna COVID-19 Vaccine 02/16/2020 10:20 AM 0.5 mL 10/06/2019 Intramuscular   Manufacturer: Moderna   Lot: [id number]   NDC: 10424-731-92

## 2020-02-24 ENCOUNTER — Inpatient Hospital Stay: Payer: BC Managed Care – PPO | Attending: Hematology and Oncology

## 2020-02-24 ENCOUNTER — Other Ambulatory Visit: Payer: Self-pay

## 2020-02-24 VITALS — Temp 95.8°F | Resp 18

## 2020-02-24 DIAGNOSIS — E538 Deficiency of other specified B group vitamins: Secondary | ICD-10-CM | POA: Diagnosis not present

## 2020-02-24 DIAGNOSIS — K909 Intestinal malabsorption, unspecified: Secondary | ICD-10-CM

## 2020-02-24 MED ORDER — CYANOCOBALAMIN 1000 MCG/ML IJ SOLN
1000.0000 ug | Freq: Once | INTRAMUSCULAR | Status: AC
  Administered 2020-02-24: 1000 ug via INTRAMUSCULAR

## 2020-02-26 ENCOUNTER — Ambulatory Visit (INDEPENDENT_AMBULATORY_CARE_PROVIDER_SITE_OTHER): Payer: BC Managed Care – PPO | Admitting: Family Medicine

## 2020-02-26 ENCOUNTER — Other Ambulatory Visit: Payer: Self-pay

## 2020-02-26 ENCOUNTER — Encounter: Payer: Self-pay | Admitting: Family Medicine

## 2020-02-26 VITALS — BP 150/90 | HR 63 | Temp 97.3°F | Resp 16 | Ht 67.0 in | Wt 255.3 lb

## 2020-02-26 DIAGNOSIS — F5101 Primary insomnia: Secondary | ICD-10-CM

## 2020-02-26 DIAGNOSIS — K432 Incisional hernia without obstruction or gangrene: Secondary | ICD-10-CM

## 2020-02-26 DIAGNOSIS — J4520 Mild intermittent asthma, uncomplicated: Secondary | ICD-10-CM

## 2020-02-26 DIAGNOSIS — G8929 Other chronic pain: Secondary | ICD-10-CM

## 2020-02-26 DIAGNOSIS — K219 Gastro-esophageal reflux disease without esophagitis: Secondary | ICD-10-CM | POA: Diagnosis not present

## 2020-02-26 DIAGNOSIS — R7303 Prediabetes: Secondary | ICD-10-CM | POA: Diagnosis not present

## 2020-02-26 DIAGNOSIS — E538 Deficiency of other specified B group vitamins: Secondary | ICD-10-CM

## 2020-02-26 DIAGNOSIS — E785 Hyperlipidemia, unspecified: Secondary | ICD-10-CM

## 2020-02-26 DIAGNOSIS — D509 Iron deficiency anemia, unspecified: Secondary | ICD-10-CM

## 2020-02-26 DIAGNOSIS — E559 Vitamin D deficiency, unspecified: Secondary | ICD-10-CM | POA: Diagnosis not present

## 2020-02-26 DIAGNOSIS — M7062 Trochanteric bursitis, left hip: Secondary | ICD-10-CM

## 2020-02-26 DIAGNOSIS — I10 Essential (primary) hypertension: Secondary | ICD-10-CM

## 2020-02-26 DIAGNOSIS — Z9884 Bariatric surgery status: Secondary | ICD-10-CM

## 2020-02-26 DIAGNOSIS — M25561 Pain in right knee: Secondary | ICD-10-CM

## 2020-02-26 MED ORDER — HYDROCHLOROTHIAZIDE 25 MG PO TABS: 25.0000 mg | ORAL_TABLET | Freq: Every day | ORAL | 1 refills | Status: DC

## 2020-02-26 MED ORDER — VITAMIN D (ERGOCALCIFEROL) 1.25 MG (50000 UNIT) PO CAPS: 50000.0000 [IU] | ORAL_CAPSULE | ORAL | 1 refills | Status: DC

## 2020-02-26 MED ORDER — OMEPRAZOLE 40 MG PO CPDR: 40.0000 mg | DELAYED_RELEASE_CAPSULE | Freq: Every day | ORAL | 0 refills | Status: DC

## 2020-02-26 MED ORDER — ROSUVASTATIN CALCIUM 10 MG PO TABS: 10.0000 mg | ORAL_TABLET | Freq: Every day | ORAL | 1 refills | Status: DC

## 2020-02-26 NOTE — Progress Notes (Signed)
Name: Savannah Manning   MRN: [id number]    DOB: 01/02/1961   Date:02/26/2020       Progress Note  Subjective  Chief Complaint  Chief Complaint  Patient presents with  . Referral    She has had some recurrent issues with acid reflux that is gradually worsening. She has been evaluated before and needs to be referred back to specialist.    HPI  Obesity/prediabetes:. She has been obese since childhood. She has tried multiple diets in the past, she hadgastric bypass in 2005. Weight before surgery was 314 lbs, she went down to 164 lbs, but has been gradually gaining weight. She states her weight has gone up since she started to work from home in  2010.She had [facility] at [facility],and was denied for a revision in the summer 2018. She has failed Metformin, started her on Ozempic  04/2017 her  weight was 259 lbs, her weight went as low as 239 lbs however today is up to 255  lbs. Her insurance has denied Ozempic, only paying for patients that have DM. She is out of medication, drinking sweet beverages ( sweet tea), skips meals. Explained importance of stopping sweets in general   Hip outer Pain: she has noticed pain on both outer hips worse when sleeping and also when going up and down stairs. Left side worse than right side and bothers her while asleep, she states lately favoring right leg because left hip is so painful and has noticed worsening of right knee pain. Pain is described as pain on anterior knee, sometimes popping, at times pain behind right knee - a pulling sensation, and last night some knee instability. She has intermittent effusion. No redness or increase in warmth   History of iron deficiency anemia and B12 deficiency: she is under the care of Dr. Merlene Pulling and is getting monthly B12 and did not require iron infusion lately. Last ferritin was 49    Insomnia: she has not been taking Trazodone, her son is in college - [employer] and worries about him but also states does not  like being at home alone at night, she still does not sleep well, but does not feel comfortable taking any medication   GERD: she has episodes of severe regurgitation, usually at night, associated with bile in her mouth, acid sensation on her chest and burning, when it happens it is severe, she vomits multiple times but once she takes PPI symptoms improves. She would like to see GI, explained that I recommend getting a wedge pillow for her bed, avoid spicy food, not to eat or drink within 2 hours before going to bed. To keep a diary when episodes happens.   HTN: she skipped her medication this morning, bp is high but usually at goal, , denies side effects of medication, no sob, chest pain or palpitation   Vitamin D deficiency: continue rx vitamin D  Dumping syndrome: she had one episode of abdominal pain after a meal, intermittent, less than once a month, resolves after an episode of diarrhea, likely dumping advised to discuss with gi and keep a diary of food that triggers symptoms. Unchanged   Patient Active Problem List   Diagnosis Date Noted  . Colon cancer screening   . Pre-diabetes 12/05/2018  . Hyperglycemia 12/05/2018  . Iron deficiency anemia 12/05/2018  . Malabsorption of iron 11/07/2018  . Chronic pain of right knee 07/26/2017  . Vitamin D deficiency 04/24/2017  . B12 deficiency 04/24/2017  . Abnormal mammogram  of right breast 01/01/2017  . Asthma, mild persistent 12/17/2016  . History of shoulder surgery 10/12/2016  . Primary osteoarthritis of left shoulder 06/14/2016  . Incomplete tear of left rotator cuff 05/30/2016  . Rotator cuff tendinitis 05/30/2016  . History of bariatric surgery 05/04/2016  . Allergic rhinitis 05/12/2015  . Benign essential HTN 05/12/2015  . Anxiety and depression 05/12/2015  . Insomnia, persistent 05/12/2015  . Dyslipidemia 05/12/2015  . Edema extremities 05/12/2015  . Gastroesophageal reflux disease without esophagitis 05/12/2015  .  Hypoglycemia 05/12/2015  . Eczema intertrigo 05/12/2015  . Migraine 05/12/2015  . Plantar fasciitis 05/12/2015  . Umbilical hernia without obstruction and without gangrene 05/12/2015  . Morbid obesity, unspecified obesity type ([place]) 02/13/2008    Past Surgical History:  Procedure Laterality Date  . ABDOMINAL HYSTERECTOMY  2012  . CHOLECYSTECTOMY    . COLONOSCOPY WITH PROPOFOL N/A 12/18/2019   Procedure: COLONOSCOPY WITH PROPOFOL;  Surgeon: Lin Landsman, MD;  Location: [facility];  Service: Gastroenterology;  Laterality: N/A;  . DILATION AND CURETTAGE OF UTERUS    . GASTRIC BYPASS  2005  . INDUCED ABORTION N/A 1997   patient was about 22 weeks  . POLYPECTOMY  12/18/2019   Procedure: POLYPECTOMY;  Surgeon: Lin Landsman, MD;  Location: [facility];  Service: Gastroenterology;;  . SHOULDER ARTHROSCOPY WITH OPEN ROTATOR CUFF REPAIR Left 06/14/2016   Procedure: SHOULDER ARTHROSCOPY WITH OPEN ROTATOR CUFF REPAIR, DECOMPRESSION, EXCISION LOOSE BODY, DEBRIDEMENT;  Surgeon: Corky Mull, MD;  Location: [facility];  Service: Orthopedics;  Laterality: Left;    Family History  Problem Relation Age of Onset  . Hypertension Mother   . Cancer Father        prostate  . Heart disease Brother   . Breast cancer Cousin 21       mat cousin    Social History   Tobacco Use  . Smoking status: Never Smoker  . Smokeless tobacco: Never Used  Substance Use Topics  . Alcohol use: No    Alcohol/week: 0.0 standard drinks     Current Outpatient Medications:  .  BD PEN NEEDLE NANO 2ND GEN 32G X 4 MM MISC, daily. or as directed, Disp: , Rfl:  .  Cyanocobalamin (B-12) 1000 MCG SUBL, Place 1 tablet under the tongue daily., Disp: 30 each, Rfl: 5 .  fluticasone (FLONASE) 50 MCG/ACT nasal spray, Place 2 sprays into both nostrils daily., Disp: 16 g, Rfl: 0 .  hydrochlorothiazide (HYDRODIURIL) 25 MG tablet, Take 1 tablet (25 mg total) by mouth daily., Disp: 90 tablet, Rfl: 1 .   Insulin Pen Needle (NOVOFINE) 32G X 6 MM MISC, 1 each by Does not apply route daily., Disp: 100 each, Rfl: 2 .  Liraglutide -Weight Management (SAXENDA) 18 MG/3ML SOPN, Inject 0.5 mLs (3 mg total) into the skin daily., Disp: 15 mL, Rfl: 2 .  Loratadine 10 MG CAPS, Take 1 capsule by mouth as needed., Disp: , Rfl:  .  nystatin cream (MYCOSTATIN), Apply 1 application topically 2 (two) times daily., Disp: 30 g, Rfl: 5 .  omeprazole (PRILOSEC) 40 MG capsule, TAKE 1 CAPSULE(40 MG) BY MOUTH DAILY, Disp: 90 capsule, Rfl: 0 .  rosuvastatin (CRESTOR) 10 MG tablet, Take 1 tablet (10 mg total) by mouth daily., Disp: 90 tablet, Rfl: 1 .  traZODone (DESYREL) 50 MG tablet, Take 1 tablet (50 mg total) by mouth at bedtime., Disp: 90 tablet, Rfl: 1 .  VENTOLIN HFA 108 (90 Base) MCG/ACT inhaler, INHALE 2 PUFFS  INTO THE LUNGS EVERY 6 HOURS AS NEEDED FOR WHEEZING OR SHORTNESS OF BREATH, Disp: 18 g, Rfl: 0 .  Vitamin D, Ergocalciferol, (DRISDOL) 1.25 MG (50000 UNIT) CAPS capsule, Take 1 capsule (50,000 Units total) by mouth every 7 (seven) days., Disp: 12 capsule, Rfl: 1  Allergies  Allergen Reactions  . Lisinopril Swelling    Facial Swelling  . Ace Inhibitors     angioedema  . Codeine Swelling  . Valsartan Swelling    I personally reviewed active problem list, medication list, allergies, family history, social history, health maintenance with the patient/caregiver today.   ROS  Constitutional: Negative for fever , positive for weight change.  Respiratory: Negative for cough and shortness of breath.   Cardiovascular: Negative for chest pain or palpitations.  Gastrointestinal: Negative for abdominal pain, no bowel changes.  Musculoskeletal: positive  for gait problem or joint swelling.  Skin: Negative for rash.  Neurological: Negative for dizziness or headache.  No other specific complaints in a complete review of systems (except as listed in HPI above).  Objective  Vitals:   02/26/20 0746  BP: (!)  150/90  Pulse: 63  Resp: 16  Temp: (!) 97.3 F (36.3 C)  TempSrc: Temporal  SpO2: 98%  Weight: 255 lb 4.8 oz (115.8 kg)  Height: 5\' 7"  (1.702 m)    Body mass index is 39.99 kg/m.  Physical Exam  Constitutional: Patient appears well-developed and well-nourished. Obese No distress.  HEENT: head atraumatic, normocephalic, pupils equal and reactive to light Cardiovascular: Normal rate, regular rhythm and normal heart sounds.  No murmur heard. No BLE edema. Pulmonary/Chest: Effort normal and breath sounds normal. No respiratory distress. Abdominal: Soft.  There is no tenderness. Paraumbilical hernia, incisional  Psychiatric: Patient has a normal mood and affect. behavior is normal. Judgment and thought content normal. Muscular Skeletal: crepitus with extension of both knees, mild effusion right knee,  Recent Results (from the past 2160 hour(s))  Lipid panel     Status: None   Collection Time: 12/04/19  9:07 AM  Result Value Ref Range   Cholesterol 194 <200 mg/dL   HDL 75 > OR = 50 mg/dL   Triglycerides 12/06/19 448 mg/dL   LDL Cholesterol (Calc) 98 mg/dL (calc)    Comment: Reference range: <100 . Desirable range <100 mg/dL for primary prevention;   <70 mg/dL for patients with CHD or diabetic patients  with > or = 2 CHD risk factors. <185 LDL-C is now calculated using the Martin-Hopkins  calculation, which is a validated novel method providing  better accuracy than the Friedewald equation in the  estimation of LDL-C.  [name] et al. Horald Pollen. Lenox Ahr): 2061-2068  (http://education.[facility].com/faq/FAQ164)    Total CHOL/HDL Ratio 2.6 <5.0 (calc)   Non-HDL Cholesterol (Calc) 119 <130 mg/dL (calc)    Comment: For patients with diabetes plus 1 major ASCVD risk  factor, treating to a non-HDL-C goal of <100 mg/dL  (LDL-C of [birth year]1971 mg/dL) is considered a therapeutic  option.   VITAMIN D 25 Hydroxy (Vit-D Deficiency, Fractures)     Status: Abnormal   Collection Time: 12/04/19   9:07 AM  Result Value Ref Range   Vit D, 25-Hydroxy 15 (L) 30 - 100 ng/mL    Comment: Vitamin D Status         25-OH Vitamin D: . Deficiency:                    <20 ng/mL Insufficiency:  20 - 29 ng/mL Optimal:                 > or = 30 ng/mL . For 25-OH Vitamin D testing on patients on  D2-supplementation and patients for whom quantitation  of D2 and D3 fractions is required, the QuestAssureD(TM) 25-OH VIT D, (D2,D3), LC/MS/MS is recommended: order  code 97026 (patients >63yrs). See Note 1 . Note 1 . For additional information, please refer to  http://education.[facility].com/faq/FAQ199  (This link is being provided for informational/ educational purposes only.)   Hemoglobin A1c     Status: None   Collection Time: 12/04/19  9:07 AM  Result Value Ref Range   Hgb A1c MFr Bld 5.6 <5.7 % of total Hgb    Comment: For the purpose of screening for the presence of diabetes: . <5.7%       Consistent with the absence of diabetes 5.7-6.4%    Consistent with increased risk for diabetes             (prediabetes) > or =6.5%  Consistent with diabetes . This assay result is consistent with a decreased risk of diabetes. . Currently, no consensus exists regarding use of hemoglobin A1c for diagnosis of diabetes in children. . According to American Diabetes Association (ADA) guidelines, hemoglobin A1c <7.0% represents optimal control in non-pregnant diabetic patients. Different metrics may apply to specific patient populations.  Standards of Medical Care in Diabetes(ADA). .    Mean Plasma Glucose 114 (calc)   eAG (mmol/L) 6.3 (calc)  COMPLETE METABOLIC PANEL WITH GFR     Status: None   Collection Time: 12/04/19  9:07 AM  Result Value Ref Range   Glucose, Bld 93 65 - 99 mg/dL    Comment: .            Fasting reference interval .    BUN 12 7 - 25 mg/dL   Creat 3.78 5.88 - 5.02 mg/dL    Comment: For patients >67 years of age, the reference limit for Creatinine is  approximately 13% higher for people identified as African-American. .    GFR, Est Non African American 91 > OR = 60 mL/min/1.34m2   GFR, Est African American 105 > OR = 60 mL/min/1.55m2   BUN/Creatinine Ratio NOT APPLICABLE 6 - 22 (calc)   Sodium 143 135 - 146 mmol/L   Potassium 4.3 3.5 - 5.3 mmol/L   Chloride 105 98 - 110 mmol/L   CO2 32 20 - 32 mmol/L   Calcium 9.4 8.6 - 10.4 mg/dL   Total Protein 6.4 6.1 - 8.1 g/dL   Albumin 4.1 3.6 - 5.1 g/dL   Globulin 2.3 1.9 - 3.7 g/dL (calc)   AG Ratio 1.8 1.0 - 2.5 (calc)   Total Bilirubin 0.3 0.2 - 1.2 mg/dL   Alkaline phosphatase (APISO) 96 37 - 153 U/L   AST 16 10 - 35 U/L   ALT 11 6 - 29 U/L  SARS CORONAVIRUS 2 (TAT 6-24 HRS) Nasopharyngeal Nasopharyngeal Swab     Status: None   Collection Time: 12/16/19  8:50 AM   Specimen: Nasopharyngeal Swab  Result Value Ref Range   SARS Coronavirus 2 NEGATIVE NEGATIVE    Comment: (NOTE) SARS-CoV-2 target nucleic acids are NOT DETECTED. The SARS-CoV-2 RNA is generally detectable in upper and lower respiratory specimens during the acute phase of infection. Negative results do not preclude SARS-CoV-2 infection, do not rule out co-infections with other pathogens, and should not be used as the sole basis for treatment  or other patient management decisions. Negative results must be combined with clinical observations, patient history, and epidemiological information. The expected result is Negative. Fact Sheet for Patients: HairSlick.nohttps://www.fda.gov/media/138098/download Fact Sheet for Healthcare Providers: quierodirigir.comhttps://www.fda.gov/media/138095/download This test is not yet approved or cleared by the [place] FDA and  has been authorized for detection and/or diagnosis of SARS-CoV-2 by FDA under an Emergency Use Authorization (EUA). This EUA will remain  in effect (meaning this test can be used) for the duration of the COVID-19 declaration under Section 56 4(b)(1) of the Act, 21 U.S.C. section  360bbb-3(b)(1), unless the authorization is terminated or revoked sooner. Performed at [facility], [address] [place], [place] [address]   Ferritin     Status: None   Collection Time: 12/28/19  3:36 PM  Result Value Ref Range   Ferritin 49 11 - 307 ng/mL    Comment: Performed at [facility], [address] [place], [place] [address]  CBC with Differential/Platelet     Status: None   Collection Time: 12/28/19  3:36 PM  Result Value Ref Range   WBC 7.2 4.0 - 10.5 K/uL   RBC 4.39 3.87 - 5.11 MIL/uL   Hemoglobin 13.3 12.0 - 15.0 g/dL   HCT 82.938.6 56.236.0 - 13.046.0 %   MCV 87.9 80.0 - 100.0 fL   MCH 30.3 26.0 - 34.0 pg   MCHC 34.5 30.0 - 36.0 g/dL   RDW 86.512.7 78.411.5 - 69.615.5 %   Platelets 255 150 - 400 K/uL   nRBC 0.0 0.0 - 0.2 %   Neutrophils Relative % 48 %   Neutro Abs 3.4 1.7 - 7.7 K/uL   Lymphocytes Relative 41 %   Lymphs Abs 3.0 0.7 - 4.0 K/uL   Monocytes Relative 8 %   Monocytes Absolute 0.6 0.1 - 1.0 K/uL   Eosinophils Relative 3 %   Eosinophils Absolute 0.2 0.0 - 0.5 K/uL   Basophils Relative 0 %   Basophils Absolute 0.0 0.0 - 0.1 K/uL   Immature Granulocytes 0 %   Abs Immature Granulocytes 0.02 0.00 - 0.07 K/uL    Comment: Performed at [facility], [address] [place], [place] [address]      PHQ2/9: Depression screen [facility] 02/26/2020 12/04/2019 06/26/2019 03/05/2019 12/05/2018  Decreased Interest 0 0 0 0 0  Down, Depressed, Hopeless 0 0 0 0 0  PHQ - 2 Score 0 0 0 0 0  Altered sleeping 0 0 0 0 0  Tired, decreased energy 0 0 0 0 0  Change in appetite 0 0 0 0 0  Feeling bad or failure about yourself  0 0 0 0 0  Trouble concentrating 0 0 0 0 0  Moving slowly or fidgety/restless 0 0 0 0 0  Suicidal thoughts 0 0 0 0 0  PHQ-9 Score 0 0 0 0 0  Difficult doing work/chores - - - - Not difficult at all    phq 9 is negative   Fall Risk: Fall Risk  02/26/2020 12/04/2019 06/26/2019 03/05/2019 12/05/2018  Falls in the past year? 0 0 0 0 0   Number falls in past yr: 0 0 0 0 0  Injury with Fall? 0 0 1 0 0  Follow up - - - - Falls evaluation completed     Functional Status Survey: Is the patient deaf or have difficulty hearing?: No Does the patient have difficulty seeing, even when wearing glasses/contacts?: No Does the patient have difficulty concentrating, remembering, or  making decisions?: No Does the patient have difficulty walking or climbing stairs?: Yes Does the patient have difficulty dressing or bathing?: No Does the patient have difficulty doing errands alone such as visiting a doctor's office or shopping?: No    Assessment & Plan  1. Gastroesophageal reflux disease without esophagitis  - omeprazole (PRILOSEC) 40 MG capsule; Take 1 capsule (40 mg total) by mouth daily.  Dispense: 90 capsule; Refill: 0 - Ambulatory referral to Gastroenterology  2. Benign essential HTN  - hydrochlorothiazide (HYDRODIURIL) 25 MG tablet; Take 1 tablet (25 mg total) by mouth daily.  Dispense: 90 tablet; Refill: 1  3. Vitamin D deficiency  - Vitamin D, Ergocalciferol, (DRISDOL) 1.25 MG (50000 UNIT) CAPS capsule; Take 1 capsule (50,000 Units total) by mouth every 7 (seven) days.  Dispense: 12 capsule; Refill: 1  4. Pre-diabetes  She must resume a healthier diet   5. Dyslipidemia  - rosuvastatin (CRESTOR) 10 MG tablet; Take 1 tablet (10 mg total) by mouth daily.  Dispense: 90 tablet; Refill: 1  6. History of bariatric surgery  Not doing well , not following a healthy diet and getting calories from drinks   7. B12 deficiency  Continue B12 injections with Dr. Merlene Pulling   8. Mild intermittent asthma without complication  Doing well at this time  9. Iron deficiency anemia, unspecified iron deficiency anemia type  Keep follow up with Dr. Merlene Pulling  10. Chronic pain of right knee  - Ambulatory referral to Orthopedic Surgery  11. Primary insomnia   12. Trochanteric bursitis of left hip  - Ambulatory referral to  Orthopedic Surgery  13. Incisional hernia without obstruction or gangrene  She wants to hold off on seeing surgeon at this time

## 2020-02-29 ENCOUNTER — Encounter: Payer: Self-pay | Admitting: Family Medicine

## 2020-03-02 ENCOUNTER — Encounter: Payer: Self-pay | Admitting: Gastroenterology

## 2020-03-02 ENCOUNTER — Ambulatory Visit: Payer: BC Managed Care – PPO | Admitting: Gastroenterology

## 2020-03-02 ENCOUNTER — Other Ambulatory Visit: Payer: Self-pay

## 2020-03-02 ENCOUNTER — Ambulatory Visit (INDEPENDENT_AMBULATORY_CARE_PROVIDER_SITE_OTHER): Payer: BC Managed Care – PPO | Admitting: Gastroenterology

## 2020-03-02 VITALS — BP 135/84 | HR 69 | Temp 97.3°F | Ht 67.0 in | Wt 254.2 lb

## 2020-03-02 DIAGNOSIS — K219 Gastro-esophageal reflux disease without esophagitis: Secondary | ICD-10-CM

## 2020-03-02 NOTE — Patient Instructions (Signed)

## 2020-03-02 NOTE — Progress Notes (Signed)
Arlyss Repress, MD [address]  [address]  [place], [place] [address]  Main: [phone]  Fax: [phone]    Gastroenterology Consultation  Referring Provider:     Alba Cory, MD Primary Care Physician:  Alba Cory, MD Primary Gastroenterologist:  Dr. Arlyss Repress Reason for Consultation:     Chronic GERD        HPI:   Savannah Manning is a 59 y.o. female referred by Dr. Alba Cory, MD  for consultation & management of chronic GERD.  Patient reports that she has been experiencing symptoms including epigastric burning pain, burning chest pain, regurgitation, sour taste in mouth, particularly at bedtime, laying down activated with fatty foods.  She was taking omeprazole as needed for several years.  She saw Dr. Carlynn Purl last week, she has been prescribed to take omeprazole 40 mg daily.  Patient reports that her symptoms are better since then.  She also reports sensation of food stuck in her lower chest.  She does not smoke or drink alcohol  NSAIDs: None  Antiplts/Anticoagulants/Anti thrombotics: None  GI Procedures:  Colonoscopy 12/18/2019 - One 3 mm polyp in the transverse colon, removed with a cold snare. Complete resection. Polyp tissue not retrieved. - The distal rectum and anal verge are normal on retroflexion view. - The examination was otherwise normal.  Upper endoscopy 03/28/2017 A. Stomach, rule out Helicobacter pylori, endoscopic biopsy:  Gastric oxyntic mucosa with mild chronic gastritis. No active gastritis is seen. No Helicobacter pylori organisms are seen on routine H&E stain.   Past Medical History:  Diagnosis Date  . Anemia   . Arthritis   . Asthma   . GERD (gastroesophageal reflux disease)   . Headache   . Hypertension   . Wears contact lenses     Past Surgical History:  Procedure Laterality Date  . ABDOMINAL HYSTERECTOMY  2012  . CHOLECYSTECTOMY    . COLONOSCOPY WITH PROPOFOL N/A 12/18/2019   Procedure: COLONOSCOPY WITH  PROPOFOL;  Surgeon: Toney Reil, MD;  Location[facility];  Service: Gastroenterology;  Laterality: N/A;  . DILATION AND CURETTAGE OF UTERUS    . GASTRIC BYPASS  2005  . INDUCED ABORTION N/A 1997   patient was about 22 weeks  . POLYPECTOMY  12/18/2019   Procedure: POLYPECTOMY;  Surgeon: Toney Reil, MD;  [facility];  Service: Gastroenterology;;  . SHOULDER ARTHROSCOPY WITH OPEN ROTATOR CUFF REPAIR Left 06/14/2016   Procedure: SHOULDER ARTHROSCOPY WITH OPEN ROTATOR CUFF REPAIR, DECOMPRESSION, EXCISION LOOSE BODY, DEBRIDEMENT;  Surgeon: Christena Flake, MD;  Location: [facility];  Service: Orthopedics;  Laterality: Left;    Current Outpatient Medications:  .  Cyanocobalamin (B-12) 1000 MCG SUBL, Place 1 tablet under the tongue daily., Disp: 30 each, Rfl: 5 .  fluticasone (FLONASE) 50 MCG/ACT nasal spray, Place 2 sprays into both nostrils daily., Disp: 16 g, Rfl: 0 .  hydrochlorothiazide (HYDRODIURIL) 25 MG tablet, Take 1 tablet (25 mg total) by mouth daily., Disp: 90 tablet, Rfl: 1 .  Insulin Pen Needle (NOVOFINE) 32G X 6 MM MISC, 1 each by Does not apply route daily., Disp: 100 each, Rfl: 2 .  Loratadine 10 MG CAPS, Take 1 capsule by mouth as needed., Disp: , Rfl:  .  nystatin cream (MYCOSTATIN), Apply 1 application topically 2 (two) times daily., Disp: 30 g, Rfl: 5 .  omeprazole (PRILOSEC) 40 MG capsule, Take 1 capsule (40 mg total) by mouth daily., Disp: 90 capsule, Rfl: 0 .  rosuvastatin (CRESTOR) 10 MG tablet, Take 1 tablet (10 mg total) by mouth daily., Disp: 90 tablet, Rfl: 1 .  VENTOLIN HFA 108 (90 Base) MCG/ACT inhaler, INHALE 2 PUFFS INTO THE LUNGS EVERY 6 HOURS AS NEEDED FOR WHEEZING OR SHORTNESS OF BREATH, Disp: 18 g, Rfl: 0 .  Vitamin D, Ergocalciferol, (DRISDOL) 1.25 MG (50000 UNIT) CAPS capsule, Take 1 capsule (50,000 Units total) by mouth every 7 (seven) days., Disp: 12 capsule, Rfl: 1   Family History  Problem Relation Age of Onset  .  Hypertension Mother   . Cancer Father        prostate  . Heart disease Brother   . Breast cancer Cousin 27       mat cousin     Social History   Tobacco Use  . Smoking status: Never Smoker  . Smokeless tobacco: Never Used  Substance Use Topics  . Alcohol use: No    Alcohol/week: 0.0 standard drinks  . Drug use: No    Allergies as of 03/02/2020 - Review Complete 03/02/2020  Allergen Reaction Noted  . Lisinopril Swelling 05/13/2015  . Ace inhibitors  04/19/2015  . Codeine Swelling 04/19/2015  . Valsartan Swelling 05/13/2015    Review of Systems:    All systems reviewed and negative except where noted in HPI.   Physical Exam:  BP 135/84 (BP Location: Left Arm, Patient Position: Sitting, Cuff Size: Large)   Pulse 69   Temp (!) 97.3 F (36.3 C) (Oral)   Ht 5\' 7"  (1.702 m)   Wt 254 lb 3 oz (115.3 kg)   BMI 39.81 kg/m  No LMP recorded. Patient has had a hysterectomy.  General:   Alert,  Well-developed, well-nourished, pleasant and cooperative in NAD Head:  Normocephalic and atraumatic. Eyes:  Sclera clear, no icterus.   Conjunctiva pink. Ears:  Normal auditory acuity. Nose:  No deformity, discharge, or lesions. Mouth:  No deformity or lesions,oropharynx pink & moist. Neck:  Supple; no masses or thyromegaly. Lungs:  Respirations even and unlabored.  Clear throughout to auscultation.   No wheezes, crackles, or rhonchi. No acute distress. Heart:  Regular rate and rhythm; no murmurs, clicks, rubs, or gallops. Abdomen:  Normal bowel sounds. Soft, obese non-tender and non-distended without masses, hepatosplenomegaly or hernias noted.  No guarding or rebound tenderness.   Rectal: Not performed Msk:  Symmetrical without gross deformities. Good, equal movement & strength bilaterally. Pulses:  Normal pulses noted. Extremities:  No clubbing or edema.  No cyanosis. Neurologic:  Alert and oriented x3;  grossly normal neurologically. Skin:  Intact without significant lesions or  rashes. No jaundice. Psych:  Alert and cooperative. Normal mood and affect.  Imaging Studies: No abdominal imaging  Assessment and Plan:   Savannah Manning is a 59 y.o. female with morbid obesity, BMI 39.8 is seen in consultation for chronic GERD  Chronic GERD Reiterated on antireflux lifestyle, information provided Continue present 40 mg once a day Recommend EGD for Barrett's screening   Follow up in 2 to 3 months   Cephas Darby, MD

## 2020-03-16 ENCOUNTER — Other Ambulatory Visit: Payer: Self-pay

## 2020-03-16 ENCOUNTER — Other Ambulatory Visit
Admission: RE | Admit: 2020-03-16 | Discharge: 2020-03-16 | Disposition: A | Discharge status: Home / Self Care | Payer: BC Managed Care – PPO | Source: Ambulatory Visit | Attending: Gastroenterology | Admitting: Gastroenterology

## 2020-03-16 DIAGNOSIS — Z20822 Contact with and (suspected) exposure to covid-19: Secondary | ICD-10-CM | POA: Insufficient documentation

## 2020-03-16 DIAGNOSIS — Z01812 Encounter for preprocedural laboratory examination: Secondary | ICD-10-CM | POA: Insufficient documentation

## 2020-03-16 LAB — SARS CORONAVIRUS 2 (TAT 6-24 HRS): SARS Coronavirus 2: NEGATIVE

## 2020-03-18 ENCOUNTER — Ambulatory Visit: Payer: BC Managed Care – PPO | Admitting: Anesthesiology

## 2020-03-18 ENCOUNTER — Encounter: Payer: Self-pay | Admitting: Gastroenterology

## 2020-03-18 ENCOUNTER — Other Ambulatory Visit: Payer: Self-pay

## 2020-03-18 ENCOUNTER — Encounter
Admission: RE | Disposition: A | Discharge status: Home / Self Care | Payer: Self-pay | Source: Home / Self Care | Attending: Gastroenterology

## 2020-03-18 ENCOUNTER — Ambulatory Visit
Admission: RE | Admit: 2020-03-18 | Discharge: 2020-03-18 | Disposition: A | Discharge status: Home / Self Care | Payer: BC Managed Care – PPO | Attending: Gastroenterology | Admitting: Gastroenterology

## 2020-03-18 DIAGNOSIS — Z9884 Bariatric surgery status: Secondary | ICD-10-CM | POA: Insufficient documentation

## 2020-03-18 DIAGNOSIS — Z980 Intestinal bypass and anastomosis status: Secondary | ICD-10-CM | POA: Diagnosis not present

## 2020-03-18 DIAGNOSIS — Z13810 Encounter for screening for upper gastrointestinal disorder: Secondary | ICD-10-CM | POA: Insufficient documentation

## 2020-03-18 DIAGNOSIS — F419 Anxiety disorder, unspecified: Secondary | ICD-10-CM | POA: Insufficient documentation

## 2020-03-18 DIAGNOSIS — Z6839 Body mass index (BMI) 39.0-39.9, adult: Secondary | ICD-10-CM | POA: Insufficient documentation

## 2020-03-18 DIAGNOSIS — E559 Vitamin D deficiency, unspecified: Secondary | ICD-10-CM | POA: Diagnosis not present

## 2020-03-18 DIAGNOSIS — F329 Major depressive disorder, single episode, unspecified: Secondary | ICD-10-CM | POA: Diagnosis not present

## 2020-03-18 DIAGNOSIS — I10 Essential (primary) hypertension: Secondary | ICD-10-CM | POA: Insufficient documentation

## 2020-03-18 DIAGNOSIS — Z794 Long term (current) use of insulin: Secondary | ICD-10-CM | POA: Insufficient documentation

## 2020-03-18 DIAGNOSIS — Z79899 Other long term (current) drug therapy: Secondary | ICD-10-CM | POA: Diagnosis not present

## 2020-03-18 DIAGNOSIS — M1990 Unspecified osteoarthritis, unspecified site: Secondary | ICD-10-CM | POA: Insufficient documentation

## 2020-03-18 DIAGNOSIS — J45909 Unspecified asthma, uncomplicated: Secondary | ICD-10-CM | POA: Diagnosis not present

## 2020-03-18 DIAGNOSIS — K219 Gastro-esophageal reflux disease without esophagitis: Secondary | ICD-10-CM | POA: Diagnosis not present

## 2020-03-18 HISTORY — PX: ESOPHAGOGASTRODUODENOSCOPY (EGD) WITH PROPOFOL: SHX5813

## 2020-03-18 SURGERY — ESOPHAGOGASTRODUODENOSCOPY (EGD) WITH PROPOFOL
Anesthesia: General

## 2020-03-18 MED ORDER — PROPOFOL 500 MG/50ML IV EMUL
INTRAVENOUS | Status: DC | PRN
  Administered 2020-03-18: 120 ug/kg/min via INTRAVENOUS

## 2020-03-18 MED ORDER — SODIUM CHLORIDE 0.9 % IV SOLN
INTRAVENOUS | Status: DC
  Administered 2020-03-18: 1000 mL via INTRAVENOUS

## 2020-03-18 MED ORDER — FENTANYL CITRATE (PF) 100 MCG/2ML IJ SOLN
INTRAMUSCULAR | Status: DC | PRN
  Administered 2020-03-18: 50 ug via INTRAVENOUS

## 2020-03-18 MED ORDER — LIDOCAINE HCL URETHRAL/MUCOSAL 2 % EX GEL
CUTANEOUS | Status: AC
  Filled 2020-03-18: qty 5

## 2020-03-18 MED ORDER — LIDOCAINE HCL (CARDIAC) PF 100 MG/5ML IV SOSY
PREFILLED_SYRINGE | INTRAVENOUS | Status: DC | PRN
  Administered 2020-03-18: 50 mg via INTRAVENOUS

## 2020-03-18 MED ORDER — MIDAZOLAM HCL 2 MG/2ML IJ SOLN
INTRAMUSCULAR | Status: DC | PRN
  Administered 2020-03-18: 2 mg via INTRAVENOUS

## 2020-03-18 MED ORDER — FENTANYL CITRATE (PF) 100 MCG/2ML IJ SOLN
INTRAMUSCULAR | Status: AC
  Filled 2020-03-18: qty 2

## 2020-03-18 MED ORDER — PROPOFOL 500 MG/50ML IV EMUL
INTRAVENOUS | Status: AC
  Filled 2020-03-18: qty 50

## 2020-03-18 MED ORDER — MIDAZOLAM HCL 2 MG/2ML IJ SOLN
INTRAMUSCULAR | Status: AC
  Filled 2020-03-18: qty 2

## 2020-03-18 NOTE — Anesthesia Postprocedure Evaluation (Signed)
Anesthesia Post Note  Patient: Savannah Manning  Procedure(s) Performed: ESOPHAGOGASTRODUODENOSCOPY (EGD) WITH PROPOFOL (N/A )  Patient location during evaluation: Endoscopy Anesthesia Type: General Level of consciousness: awake and alert Pain management: pain level controlled Vital Signs Assessment: post-procedure vital signs reviewed and stable Respiratory status: spontaneous breathing, nonlabored ventilation, respiratory function stable and patient connected to nasal cannula oxygen Cardiovascular status: blood pressure returned to baseline and stable Postop Assessment: no apparent nausea or vomiting Anesthetic complications: no     Last Vitals:  Vitals:   03/18/20 0958 03/18/20 1113  BP: (!) 156/91 122/79  Pulse: (!) 48   Resp: 20   Temp: (!) 36.1 C (!) 36.1 C  SpO2: 100%     Last Pain:  Vitals:   03/18/20 1143  TempSrc:   PainSc: 0-No pain                 Lenard Simmer

## 2020-03-18 NOTE — H&P (Signed)
Arlyss Repress, MD [address]  [address]  [place], [place] [address]  Main: [phone]  Fax: [phone] Pager: [phone]  Primary Care Physician:  Alba Cory, MD Primary Gastroenterologist:  Dr. Arlyss Repress  Pre-Procedure History & Physical: HPI:  Savannah Manning is a 59 y.o. female is here for an endoscopy.   Past Medical History:  Diagnosis Date  . Anemia   . Arthritis   . Asthma   . GERD (gastroesophageal reflux disease)   . Headache   . Hypertension   . Wears contact lenses     Past Surgical History:  Procedure Laterality Date  . ABDOMINAL HYSTERECTOMY  2012  . CHOLECYSTECTOMY    . COLONOSCOPY WITH PROPOFOL N/A 12/18/2019   Procedure: COLONOSCOPY WITH PROPOFOL;  Surgeon: Toney Reil, MD;  Location: [facility];  Service: Gastroenterology;  Laterality: N/A;  . DILATION AND CURETTAGE OF UTERUS    . GASTRIC BYPASS  2005  . INDUCED ABORTION N/A 1997   patient was about 22 weeks  . POLYPECTOMY  12/18/2019   Procedure: POLYPECTOMY;  Surgeon: Toney Reil, MD;  Location: [facility];  Service: Gastroenterology;;  . SHOULDER ARTHROSCOPY WITH OPEN ROTATOR CUFF REPAIR Left 06/14/2016   Procedure: SHOULDER ARTHROSCOPY WITH OPEN ROTATOR CUFF REPAIR, DECOMPRESSION, EXCISION LOOSE BODY, DEBRIDEMENT;  Surgeon: Christena Flake, MD;  Location: [facility];  Service: Orthopedics;  Laterality: Left;    Prior to Admission medications   Medication Sig Start Date End Date Taking? Authorizing Provider  Cyanocobalamin (B-12) 1000 MCG SUBL Place 1 tablet under the tongue daily. 03/05/19  Yes Sowles, Danna Hefty, MD  fluticasone (FLONASE) 50 MCG/ACT nasal spray Place 2 sprays into both nostrils daily. 11/03/18  Yes Doren Custard, FNP  hydrochlorothiazide (HYDRODIURIL) 25 MG tablet Take 1 tablet (25 mg total) by mouth daily. 02/26/20  Yes Sowles, Danna Hefty, MD  Insulin Pen Needle (NOVOFINE) 32G X 6 MM MISC 1 each by Does not apply route daily. 01/04/20  Yes  Sowles, Danna Hefty, MD  Loratadine 10 MG CAPS Take 1 capsule by mouth as needed.   Yes [provider]  nystatin cream (MYCOSTATIN) Apply 1 application topically 2 (two) times daily. 11/05/19  Yes Tresea Mall, CNM  omeprazole (PRILOSEC) 40 MG capsule Take 1 capsule (40 mg total) by mouth daily. 02/26/20  Yes Sowles, Danna Hefty, MD  rosuvastatin (CRESTOR) 10 MG tablet Take 1 tablet (10 mg total) by mouth daily. 02/26/20  Yes Sowles, Danna Hefty, MD  Vitamin D, Ergocalciferol, (DRISDOL) 1.25 MG (50000 UNIT) CAPS capsule Take 1 capsule (50,000 Units total) by mouth every 7 (seven) days. 02/26/20  Yes Sowles, Danna Hefty, MD  VENTOLIN HFA 108 (90 Base) MCG/ACT inhaler INHALE 2 PUFFS INTO THE LUNGS EVERY 6 HOURS AS NEEDED FOR WHEEZING OR SHORTNESS OF BREATH 02/14/20   Alba Cory, MD    Allergies as of 03/02/2020 - Review Complete 03/02/2020  Allergen Reaction Noted  . Lisinopril Swelling 05/13/2015  . Ace inhibitors  04/19/2015  . Codeine Swelling 04/19/2015  . Valsartan Swelling 05/13/2015    Family History  Problem Relation Age of Onset  . Hypertension Mother   . Cancer Father        prostate  . Heart disease Brother   . Breast cancer Cousin 40       mat cousin    Social History   Socioeconomic History  . Marital status: Single    Spouse name: Not on file  . Number of children: 1  . Years  of education: Not on file  . Highest education level: Not on file  Occupational History  . Not on file  Tobacco Use  . Smoking status: Never Smoker  . Smokeless tobacco: Never Used  Substance and Sexual Activity  . Alcohol use: No    Alcohol/week: 0.0 standard drinks  . Drug use: No  . Sexual activity: Not Currently  Other Topics Concern  . Not on file  Social History Narrative   Desk job/works for insurance; no smoking; no alcohol; lives in [place].    Social Determinants of Health   Financial Resource Strain:   . Difficulty of Paying Living Expenses:   Food Insecurity:   . Worried  About Charity fundraiser in the Last Year:   . Arboriculturist in the Last Year:   Transportation Needs:   . Film/video editor (Medical):   [name] Lack of Transportation (Non-Medical):   Physical Activity:   . Days of Exercise per Week:   . Minutes of Exercise per Session:   Stress:   . Feeling of Stress :   Social Connections:   . Frequency of Communication with Friends and Family:   . Frequency of Social Gatherings with Friends and Family:   . Attends Religious Services:   . Active Member of Clubs or Organizations:   . Attends Archivist Meetings:   [name] Marital Status:   Intimate Partner Violence:   . Fear of Current or Ex-Partner:   . Emotionally Abused:   [name] Physically Abused:   . Sexually Abused:     Review of Systems: See HPI, otherwise negative ROS  Physical Exam: BP (!) 156/91   Pulse (!) 48   Temp (!) 96.9 F (36.1 C) (Temporal)   Resp 20   Ht 5\' 7"  (1.702 m)   Wt 115.5 kg   SpO2 100%   BMI 39.86 kg/m  General:   Alert,  pleasant and cooperative in NAD Head:  Normocephalic and atraumatic. Neck:  Supple; no masses or thyromegaly. Lungs:  Clear throughout to auscultation.    Heart:  Regular rate and rhythm. Abdomen:  Soft, nontender and nondistended. Normal bowel sounds, without guarding, and without rebound.   Neurologic:  Alert and  oriented x4;  grossly normal neurologically.  Impression/Plan: Savannah Manning is here for an endoscopy to be performed for chronic GERD, screening Barrett's  Risks, benefits, limitations, and alternatives regarding  endoscopy have been reviewed with the patient.  Questions have been answered.  All parties agreeable.   Sherri Sear, MD  03/18/2020, 10:49 AM

## 2020-03-18 NOTE — Op Note (Addendum)
[facility] Gastroenterology Patient Name: Savannah Manning Procedure Date: 03/18/2020 10:57 AM MRN: [id number] Account #: [id number] Date of Birth: 24-Oct-1961 Admit Type: Outpatient Age: 58 Room: [facility] ENDO ROOM 3 Gender: Female Note Status: Finalized Procedure:             Upper GI endoscopy Indications:           Screening procedure, Screening for Barrett's                         esophagus, Screening for Barrett's esophagus in                         patient at risk for this condition Providers:             Lin Landsman MD, MD Referring MD:          Bethena Roys. Sowles, MD (Referring MD) Medicines:             Monitored Anesthesia Care Complications:         No immediate complications. Estimated blood loss: None. Procedure:             Pre-Anesthesia Assessment:                        - Prior to the procedure, a History and Physical was                         performed, and patient medications and allergies were                         reviewed. The patient is competent. The risks and                         benefits of the procedure and the sedation options and                         risks were discussed with the patient. All questions                         were answered and informed consent was obtained.                         Patient identification and proposed procedure were                         verified by the physician, the nurse, the                         anesthesiologist, the anesthetist and the technician                         in the pre-procedure area in the procedure room in the                         endoscopy suite. Mental Status Examination: alert and                         oriented. Airway Examination: normal oropharyngeal  airway and neck mobility. Respiratory Examination:                         clear to auscultation. CV Examination: normal.                         Prophylactic Antibiotics: The patient does  not require                         prophylactic antibiotics. Prior Anticoagulants: The                         patient has taken no previous anticoagulant or                         antiplatelet agents. ASA Grade Assessment: II - A                         patient with mild systemic disease. After reviewing                         the risks and benefits, the patient was deemed in                         satisfactory condition to undergo the procedure. The                         anesthesia plan was to use monitored anesthesia care                         (MAC). Immediately prior to administration of                         medications, the patient was re-assessed for adequacy                         to receive sedatives. The heart rate, respiratory                         rate, oxygen saturations, blood pressure, adequacy of                         pulmonary ventilation, and response to care were                         monitored throughout the procedure. The physical                         status of the patient was re-assessed after the                         procedure.                        After obtaining informed consent, the endoscope was                         passed under direct vision. Throughout the procedure,  the patient's blood pressure, pulse, and oxygen                         saturations were monitored continuously. The Endoscope                         was introduced through the mouth, and advanced to the                         afferent jejunal loop. The upper GI endoscopy was                         accomplished without difficulty. The patient tolerated                         the procedure well. Findings:      Evidence of a Roux-en-Y gastrojejunostomy was found. The gastrojejunal       anastomosis was characterized by healthy appearing mucosa. This was       traversed. The pouch-to-jejunum limb was characterized by healthy       appearing  mucosa.      The gastroesophageal junction and examined esophagus were normal. Impression:            - Roux-en-Y gastrojejunostomy with gastrojejunal                         anastomosis characterized by healthy appearing mucosa.                        - Normal gastroesophageal junction and esophagus.                        - No specimens collected. Recommendation:        - Discharge patient to home (with escort).                        - Resume previous diet today.                        - Continue present medications.                        - Follow an antireflux regimen indefinitely. Procedure Code(s):     --- Professional ---                        [phone], Esophagogastroduodenoscopy, flexible,                         transoral; diagnostic, including collection of                         specimen(s) by brushing or washing, when performed                         (separate procedure) Diagnosis Code(s):     --- Professional ---                        Z98.0, Intestinal bypass and anastomosis status  Z13.810, Encounter for screening for upper                         gastrointestinal disorder CPT copyright 2019 American Medical Association. All rights reserved. The codes documented in this report are preliminary and upon coder review may  be revised to meet current compliance requirements. Dr. Ulyess Mort Lin Landsman MD, MD 03/18/2020 11:10:05 AM This report has been signed electronically. Number of Addenda: 0 Note Initiated On: 03/18/2020 10:57 AM Estimated Blood Loss:  Estimated blood loss: none.      [facility]

## 2020-03-18 NOTE — Anesthesia Preprocedure Evaluation (Signed)
Anesthesia Evaluation  Patient identified by MRN, date of birth, ID band Patient awake    Reviewed: Allergy & Precautions, NPO status , Patient's Chart, lab work & pertinent test results  History of Anesthesia Complications Negative for: history of anesthetic complications  Airway Mallampati: III  TM Distance: >3 FB Neck ROM: Full    Dental  (+) Dental Advidsory Given, Missing   Pulmonary neg shortness of breath, asthma (mild intermittent, has not used inhaler for several months) , neg sleep apnea, neg COPD, neg recent URI,    breath sounds clear to auscultation- rhonchi (-) wheezing      Cardiovascular Exercise Tolerance: Good hypertension, Pt. on home beta blockers (-) angina(-) CAD, (-) Past MI and (-) Cardiac Stents (-) dysrhythmias (-) Valvular Problems/Murmurs Rhythm:Regular Rate:Normal - Systolic murmurs and - Diastolic murmurs    Neuro/Psych  Headaches, neg Seizures PSYCHIATRIC DISORDERS Anxiety Depression    GI/Hepatic Neg liver ROS, GERD  ,  Endo/Other  neg diabetesMorbid obesity  Renal/GU negative Renal ROS     Musculoskeletal  (+) Arthritis , Osteoarthritis,    Abdominal (+) + obese,   Peds  Hematology   Anesthesia Other Findings Past Medical History: No date: Anemia No date: Arthritis No date: Asthma No date: GERD (gastroesophageal reflux disease) No date: Headache No date: Hypertension   Reproductive/Obstetrics                             Anesthesia Physical  Anesthesia Plan  ASA: II  Anesthesia Plan: General   Post-op Pain Management:  Regional for Post-op pain   Induction: Intravenous  PONV Risk Score and Plan: 3 and Propofol infusion and TIVA  Airway Management Planned: Natural Airway and Nasal Cannula  Additional Equipment:   Intra-op Plan:   Post-operative Plan:   Informed Consent: I have reviewed the patients History and Physical, chart, labs and  discussed the procedure including the risks, benefits and alternatives for the proposed anesthesia with the patient or authorized representative who has indicated his/her understanding and acceptance.     Dental advisory given  Plan Discussed with: Anesthesiologist and CRNA  Anesthesia Plan Comments:         Anesthesia Quick Evaluation

## 2020-03-18 NOTE — Transfer of Care (Signed)
Immediate Anesthesia Transfer of Care Note  Patient: Savannah Manning  Procedure(s) Performed: ESOPHAGOGASTRODUODENOSCOPY (EGD) WITH PROPOFOL (N/A )  Patient Location: PACU  Anesthesia Type:General  Level of Consciousness: awake and sedated  Airway & Oxygen Therapy: Patient Spontanous Breathing and Patient connected to nasal cannula oxygen  Post-op Assessment: Report given to RN and Post -op Vital signs reviewed and stable  Post vital signs: Reviewed and stable  Last Vitals:  Vitals Value Taken Time  BP    Temp    Pulse    Resp    SpO2      Last Pain:  Vitals:   03/18/20 0958  TempSrc: Temporal  PainSc: 0-No pain         Complications: No apparent anesthesia complications

## 2020-03-18 NOTE — Anesthesia Procedure Notes (Signed)
Performed by: Cook-Martin, Cindy Pre-anesthesia Checklist: Patient identified, Emergency Drugs available, Suction available, Patient being monitored and Timeout performed Patient Re-evaluated:Patient Re-evaluated prior to induction Oxygen Delivery Method: Nasal cannula Preoxygenation: Pre-oxygenation with 100% oxygen Induction Type: IV induction Airway Equipment and Method: Bite block Placement Confirmation: positive ETCO2 and CO2 detector       

## 2020-03-23 ENCOUNTER — Inpatient Hospital Stay: Payer: BC Managed Care – PPO | Attending: Hematology and Oncology

## 2020-03-23 ENCOUNTER — Inpatient Hospital Stay: Payer: BC Managed Care – PPO

## 2020-03-23 ENCOUNTER — Other Ambulatory Visit: Payer: Self-pay

## 2020-03-23 VITALS — BP 126/77 | HR 50 | Resp 17

## 2020-03-23 DIAGNOSIS — E538 Deficiency of other specified B group vitamins: Secondary | ICD-10-CM | POA: Insufficient documentation

## 2020-03-23 DIAGNOSIS — K909 Intestinal malabsorption, unspecified: Secondary | ICD-10-CM

## 2020-03-23 DIAGNOSIS — D508 Other iron deficiency anemias: Secondary | ICD-10-CM | POA: Insufficient documentation

## 2020-03-23 DIAGNOSIS — Z9884 Bariatric surgery status: Secondary | ICD-10-CM | POA: Insufficient documentation

## 2020-03-23 DIAGNOSIS — K9589 Other complications of other bariatric procedure: Secondary | ICD-10-CM

## 2020-03-23 LAB — CBC WITH DIFFERENTIAL/PLATELET
Abs Immature Granulocytes: 0.01 10*3/uL (ref 0.00–0.07)
Basophils Absolute: 0 10*3/uL (ref 0.0–0.1)
Basophils Relative: 0 %
Eosinophils Absolute: 0.1 10*3/uL (ref 0.0–0.5)
Eosinophils Relative: 2 %
HCT: 37.3 % (ref 36.0–46.0)
Hemoglobin: 12.5 g/dL (ref 12.0–15.0)
Immature Granulocytes: 0 %
Lymphocytes Relative: 41 %
Lymphs Abs: 2.6 10*3/uL (ref 0.7–4.0)
MCH: 30 pg (ref 26.0–34.0)
MCHC: 33.5 g/dL (ref 30.0–36.0)
MCV: 89.4 fL (ref 80.0–100.0)
Monocytes Absolute: 0.4 10*3/uL (ref 0.1–1.0)
Monocytes Relative: 6 %
Neutro Abs: 3.1 10*3/uL (ref 1.7–7.7)
Neutrophils Relative %: 51 %
Platelets: 235 10*3/uL (ref 150–400)
RBC: 4.17 MIL/uL (ref 3.87–5.11)
RDW: 13 % (ref 11.5–15.5)
WBC: 6.2 10*3/uL (ref 4.0–10.5)
nRBC: 0 % (ref 0.0–0.2)

## 2020-03-23 LAB — FERRITIN: Ferritin: 40 ng/mL (ref 11–307)

## 2020-03-23 MED ORDER — CYANOCOBALAMIN 1000 MCG/ML IJ SOLN
1000.0000 ug | Freq: Once | INTRAMUSCULAR | Status: AC
  Administered 2020-03-23: 1000 ug via INTRAMUSCULAR

## 2020-03-23 NOTE — Patient Instructions (Signed)

## 2020-04-06 DIAGNOSIS — M1711 Unilateral primary osteoarthritis, right knee: Secondary | ICD-10-CM | POA: Insufficient documentation

## 2020-04-06 DIAGNOSIS — M25561 Pain in right knee: Secondary | ICD-10-CM | POA: Diagnosis not present

## 2020-04-06 DIAGNOSIS — M170 Bilateral primary osteoarthritis of knee: Secondary | ICD-10-CM | POA: Diagnosis not present

## 2020-04-06 DIAGNOSIS — M25562 Pain in left knee: Secondary | ICD-10-CM | POA: Diagnosis not present

## 2020-04-06 DIAGNOSIS — M1712 Unilateral primary osteoarthritis, left knee: Secondary | ICD-10-CM | POA: Insufficient documentation

## 2020-04-18 ENCOUNTER — Encounter: Payer: Self-pay | Admitting: Family Medicine

## 2020-04-19 ENCOUNTER — Other Ambulatory Visit: Payer: Self-pay

## 2020-04-20 ENCOUNTER — Other Ambulatory Visit: Payer: Self-pay

## 2020-04-20 ENCOUNTER — Inpatient Hospital Stay: Payer: BC Managed Care – PPO

## 2020-04-20 ENCOUNTER — Inpatient Hospital Stay: Payer: BC Managed Care – PPO | Attending: Hematology and Oncology

## 2020-04-20 ENCOUNTER — Encounter: Payer: Self-pay | Admitting: Gastroenterology

## 2020-04-20 ENCOUNTER — Ambulatory Visit (INDEPENDENT_AMBULATORY_CARE_PROVIDER_SITE_OTHER): Payer: BC Managed Care – PPO | Admitting: Gastroenterology

## 2020-04-20 VITALS — BP 124/85 | HR 69 | Temp 97.5°F | Ht 67.0 in | Wt 256.4 lb

## 2020-04-20 DIAGNOSIS — E538 Deficiency of other specified B group vitamins: Secondary | ICD-10-CM | POA: Insufficient documentation

## 2020-04-20 DIAGNOSIS — K219 Gastro-esophageal reflux disease without esophagitis: Secondary | ICD-10-CM

## 2020-04-20 DIAGNOSIS — K909 Intestinal malabsorption, unspecified: Secondary | ICD-10-CM

## 2020-04-20 MED ORDER — CYANOCOBALAMIN 1000 MCG/ML IJ SOLN
1000.0000 ug | Freq: Once | INTRAMUSCULAR | Status: AC
  Administered 2020-04-20: 1000 ug via INTRAMUSCULAR

## 2020-04-20 MED ORDER — CYANOCOBALAMIN 1000 MCG/ML IJ SOLN
INTRAMUSCULAR | Status: AC
  Filled 2020-04-20: qty 1

## 2020-04-20 NOTE — Progress Notes (Signed)
Arlyss Repress, MD [address]  [address]  [place], [place] [address]  Main: [phone]  Fax: [phone]    Gastroenterology Consultation  Referring Provider:     Alba Cory, MD Primary Care Physician:  Alba Cory, MD Primary Gastroenterologist:  Dr. Arlyss Repress Reason for Consultation:     Chronic GERD        HPI:   Savannah Manning is a 59 y.o. female referred by Dr. Alba Cory, MD  for consultation & management of chronic GERD.  Patient reports that she has been experiencing symptoms including epigastric burning pain, burning chest pain, regurgitation, sour taste in mouth, particularly at bedtime, laying down activated with fatty foods.  She was taking omeprazole as needed for several years.  She saw Dr. Carlynn Purl last week, she has been prescribed to take omeprazole 40 mg daily.  Patient reports that her symptoms are better since then.  She also reports sensation of food stuck in her lower chest.  Follow-up visit 04/20/2020 Patient reports that her nocturnal GERD symptoms have significantly improved after she started using a wedge pillow.  She is taking omeprazole 40 mg once a day before breakfast.  She reports her daytime symptoms are intermittent.  She continues to gain weight.  Her EGD was unremarkable  She does not smoke or drink alcohol  NSAIDs: None  Antiplts/Anticoagulants/Anti thrombotics: None  GI Procedures:  Colonoscopy 12/18/2019 - One 3 mm polyp in the transverse colon, removed with a cold snare. Complete resection. Polyp tissue not retrieved. - The distal rectum and anal verge are normal on retroflexion view. - The examination was otherwise normal.  Upper endoscopy 03/28/2017 A. Stomach, rule out Helicobacter pylori, endoscopic biopsy:  Gastric oxyntic mucosa with mild chronic gastritis. No active gastritis is seen. No Helicobacter pylori organisms are seen on routine H&E stain.  EGD 03/18/20 - Roux-en-Y gastrojejunostomy with  gastrojejunal anastomosis characterized by healthy appearing mucosa. - Normal gastroesophageal junction and esophagus. - No specimens collected.  Past Medical History:  Diagnosis Date  . Anemia   . Arthritis   . Asthma   . GERD (gastroesophageal reflux disease)   . Headache   . Hypertension   . Wears contact lenses     Past Surgical History:  Procedure Laterality Date  . ABDOMINAL HYSTERECTOMY  2012  . CHOLECYSTECTOMY    . COLONOSCOPY WITH PROPOFOL N/A 12/18/2019   Procedure: COLONOSCOPY WITH PROPOFOL;  Surgeon: Toney Reil, MD;  Location: [facility];  Service: Gastroenterology;  Laterality: N/A;  . DILATION AND CURETTAGE OF UTERUS    . ESOPHAGOGASTRODUODENOSCOPY (EGD) WITH PROPOFOL N/A 03/18/2020   Procedure: ESOPHAGOGASTRODUODENOSCOPY (EGD) WITH PROPOFOL;  Surgeon: Toney Reil, MD;  Location: [facility];  Service: Gastroenterology;  Laterality: N/A;  . GASTRIC BYPASS  2005  . INDUCED ABORTION N/A 1997   patient was about 22 weeks  . POLYPECTOMY  12/18/2019   Procedure: POLYPECTOMY;  Surgeon: Toney Reil, MD;  Location: [facility];  Service: Gastroenterology;;  . SHOULDER ARTHROSCOPY WITH OPEN ROTATOR CUFF REPAIR Left 06/14/2016   Procedure: SHOULDER ARTHROSCOPY WITH OPEN ROTATOR CUFF REPAIR, DECOMPRESSION, EXCISION LOOSE BODY, DEBRIDEMENT;  Surgeon: Christena Flake, MD;  Location: [facility];  Service: Orthopedics;  Laterality: Left;    Current Outpatient Medications:  .  Cyanocobalamin (B-12) 1000 MCG SUBL, Place 1 tablet under the tongue daily., Disp: 30 each, Rfl: 5 .  fluticasone (FLONASE) 50 MCG/ACT nasal spray, Place 2 sprays into both nostrils daily., Disp: 16  g, Rfl: 0 .  hydrochlorothiazide (HYDRODIURIL) 25 MG tablet, Take 1 tablet (25 mg total) by mouth daily., Disp: 90 tablet, Rfl: 1 .  Insulin Pen Needle (NOVOFINE) 32G X 6 MM MISC, 1 each by Does not apply route daily., Disp: 100 each, Rfl: 2 .  Loratadine 10 MG CAPS, Take 1  capsule by mouth as needed., Disp: , Rfl:  .  nystatin cream (MYCOSTATIN), Apply 1 application topically 2 (two) times daily., Disp: 30 g, Rfl: 5 .  omeprazole (PRILOSEC) 40 MG capsule, Take 1 capsule (40 mg total) by mouth daily., Disp: 90 capsule, Rfl: 0 .  rosuvastatin (CRESTOR) 10 MG tablet, Take 1 tablet (10 mg total) by mouth daily., Disp: 90 tablet, Rfl: 1 .  SAXENDA 18 MG/3ML SOPN, , Disp: , Rfl:  .  VENTOLIN HFA 108 (90 Base) MCG/ACT inhaler, INHALE 2 PUFFS INTO THE LUNGS EVERY 6 HOURS AS NEEDED FOR WHEEZING OR SHORTNESS OF BREATH, Disp: 18 g, Rfl: 0 .  Vitamin D, Ergocalciferol, (DRISDOL) 1.25 MG (50000 UNIT) CAPS capsule, Take 1 capsule (50,000 Units total) by mouth every 7 (seven) days., Disp: 12 capsule, Rfl: 1   Family History  Problem Relation Age of Onset  . Hypertension Mother   . Cancer Father        prostate  . Heart disease Brother   . Breast cancer Cousin 40       mat cousin     Social History   Tobacco Use  . Smoking status: Never Smoker  . Smokeless tobacco: Never Used  Vaping Use  . Vaping Use: Never used  Substance Use Topics  . Alcohol use: No    Alcohol/week: 0.0 standard drinks  . Drug use: No    Allergies as of 04/20/2020 - Review Complete 04/20/2020  Allergen Reaction Noted  . Lisinopril Swelling 05/13/2015  . Ace inhibitors  04/19/2015  . Codeine Swelling 04/19/2015  . Valsartan Swelling 05/13/2015    Review of Systems:    All systems reviewed and negative except where noted in HPI.   Physical Exam:  BP 124/85 (BP Location: Left Arm, Patient Position: Sitting, Cuff Size: Large)   Pulse 69   Temp (!) 97.5 F (36.4 C) (Oral)   Ht 5\' 7"  (1.702 m)   Wt 256 lb 6 oz (116.3 kg)   BMI 40.15 kg/m  No LMP recorded. Patient has had a hysterectomy.  General:   Alert,  Well-developed, well-nourished, pleasant and cooperative in NAD Head:  Normocephalic and atraumatic. Eyes:  Sclera clear, no icterus.   Conjunctiva pink. Ears:  Normal auditory  acuity. Nose:  No deformity, discharge, or lesions. Mouth:  No deformity or lesions,oropharynx pink & moist. Neck:  Supple; no masses or thyromegaly. Lungs:  Respirations even and unlabored.  Clear throughout to auscultation.   No wheezes, crackles, or rhonchi. No acute distress. Heart:  Regular rate and rhythm; no murmurs, clicks, rubs, or gallops. Abdomen:  Normal bowel sounds. Soft, obese non-tender and non-distended without masses, hepatosplenomegaly or hernias noted.  No guarding or rebound tenderness.   Rectal: Not performed Msk:  Symmetrical without gross deformities. Good, equal movement & strength bilaterally. Pulses:  Normal pulses noted. Extremities:  No clubbing or edema.  No cyanosis. Neurologic:  Alert and oriented x3;  grossly normal neurologically. Skin:  Intact without significant lesions or rashes. No jaundice. Psych:  Alert and cooperative. Normal mood and affect.  Imaging Studies: No abdominal imaging  Assessment and Plan:   RONALDA WALPOLE is a  59 y.o. female with morbid obesity, BMI 39.8 is seen for follow-up of chronic GERD  Chronic GERD EGD normal Reiterated on antireflux lifestyle, information provided Increase prilosec 40 mg to 2 times a day   Follow up in 2 to 3 months   Cephas Darby, MD

## 2020-05-04 ENCOUNTER — Ambulatory Visit: Payer: BC Managed Care – PPO | Admitting: Gastroenterology

## 2020-05-18 ENCOUNTER — Inpatient Hospital Stay: Payer: BC Managed Care – PPO | Attending: Hematology and Oncology

## 2020-05-18 ENCOUNTER — Other Ambulatory Visit: Payer: Self-pay

## 2020-05-18 DIAGNOSIS — K909 Intestinal malabsorption, unspecified: Secondary | ICD-10-CM

## 2020-05-18 DIAGNOSIS — E538 Deficiency of other specified B group vitamins: Secondary | ICD-10-CM | POA: Insufficient documentation

## 2020-05-18 MED ORDER — CYANOCOBALAMIN 1000 MCG/ML IJ SOLN
1000.0000 ug | Freq: Once | INTRAMUSCULAR | Status: AC
  Administered 2020-05-18: 1000 ug via INTRAMUSCULAR

## 2020-06-07 ENCOUNTER — Ambulatory Visit: Payer: BC Managed Care – PPO | Admitting: Family Medicine

## 2020-06-13 ENCOUNTER — Inpatient Hospital Stay: Payer: BC Managed Care – PPO | Attending: Hematology and Oncology

## 2020-06-13 ENCOUNTER — Other Ambulatory Visit: Payer: Self-pay

## 2020-06-13 DIAGNOSIS — E538 Deficiency of other specified B group vitamins: Secondary | ICD-10-CM | POA: Diagnosis not present

## 2020-06-13 DIAGNOSIS — D508 Other iron deficiency anemias: Secondary | ICD-10-CM | POA: Insufficient documentation

## 2020-06-13 DIAGNOSIS — Z9884 Bariatric surgery status: Secondary | ICD-10-CM | POA: Insufficient documentation

## 2020-06-13 DIAGNOSIS — K9589 Other complications of other bariatric procedure: Secondary | ICD-10-CM

## 2020-06-13 LAB — CBC WITH DIFFERENTIAL/PLATELET
Abs Immature Granulocytes: 0.19 10*3/uL — ABNORMAL HIGH (ref 0.00–0.07)
Basophils Absolute: 0 10*3/uL (ref 0.0–0.1)
Basophils Relative: 1 %
Eosinophils Absolute: 0.1 10*3/uL (ref 0.0–0.5)
Eosinophils Relative: 2 %
HCT: 37.2 % (ref 36.0–46.0)
Hemoglobin: 12.8 g/dL (ref 12.0–15.0)
Immature Granulocytes: 3 %
Lymphocytes Relative: 32 %
Lymphs Abs: 2.1 10*3/uL (ref 0.7–4.0)
MCH: 30.6 pg (ref 26.0–34.0)
MCHC: 34.4 g/dL (ref 30.0–36.0)
MCV: 89 fL (ref 80.0–100.0)
Monocytes Absolute: 0.5 10*3/uL (ref 0.1–1.0)
Monocytes Relative: 8 %
Neutro Abs: 3.6 10*3/uL (ref 1.7–7.7)
Neutrophils Relative %: 54 %
Platelets: 256 10*3/uL (ref 150–400)
RBC: 4.18 MIL/uL (ref 3.87–5.11)
RDW: 13.1 % (ref 11.5–15.5)
WBC: 6.5 10*3/uL (ref 4.0–10.5)
nRBC: 0 % (ref 0.0–0.2)

## 2020-06-13 LAB — IRON AND TIBC
Iron: 76 ug/dL (ref 28–170)
Saturation Ratios: 19 % (ref 10.4–31.8)
TIBC: 407 ug/dL (ref 250–450)
UIBC: 331 ug/dL

## 2020-06-13 LAB — FERRITIN: Ferritin: 35 ng/mL (ref 11–307)

## 2020-06-14 ENCOUNTER — Inpatient Hospital Stay: Payer: BC Managed Care – PPO

## 2020-06-14 NOTE — Progress Notes (Signed)
[facility]  [address] [place], [place] [address] Phone: [phone]  Fax: [phone]   Clinic Day:  06/15/2020  Referring physician: Alba Cory, MD  Chief Complaint: Savannah Manning is a 59 y.o. female with s/p gastric bypass surgery with iron deficiency anemia and B12 deficiency who is seen for a 6 month assessment.   HPI: The patient was last seen in the hematology clinic on 12/30/2019 for new patient assessment.  At that time, she felt "great".  She denied any bleeding. Hematocrit was 38.6, hemoglobin 13.3, platelets 255,000, WBC 7,200. Ferritin was 49.  EGD on 03/18/2020 by Dr. Allegra Lai revealed Roux-en-Y gastrojejunostomy with gastrojejunal anastomosis characterized by healthy appearing mucosa. No specimens were collected.  Labs followed: 03/23/2020: Hematocrit 37.3, hemoglobin 12.5, platelets 235,000, WBC 6,200. Ferritin 40. 06/13/2020: Hematocrit 37.2, hemoglobin 12.8, platelets 256,000, WBC 6,500. Ferritin 35. Iron saturation 19%. TIBC 407.  The patient received monthly Vitamin B12 injections x 5 (01/27/2020 - 05/18/2020)  During the interim, she has been "wonderful." She has occasional episodes of shakiness which she thinks are due to low blood sugar. She has not had an episode like this in the past month but keeps orange juice in her fridge just in case. She does not check her blood sugar at home.  The patient has not had leg cramps at night in a while. Her hip bursitis is stable and she has arthritis in both knees. She stopped taking Ozempic because her insurance stopped paying for it.    Past Medical History:  Diagnosis Date  . Anemia   . Arthritis   . Asthma   . GERD (gastroesophageal reflux disease)   . Headache   . Hypertension   . Iron deficiency anemia 12/05/2018  . Wears contact lenses     Past Surgical History:  Procedure Laterality Date  . ABDOMINAL HYSTERECTOMY  2012  . CHOLECYSTECTOMY    . COLONOSCOPY WITH  PROPOFOL N/A 12/18/2019   Procedure: COLONOSCOPY WITH PROPOFOL;  Surgeon: Toney Reil, MD;  Location: [facility];  Service: Gastroenterology;  Laterality: N/A;  . DILATION AND CURETTAGE OF UTERUS    . ESOPHAGOGASTRODUODENOSCOPY (EGD) WITH PROPOFOL N/A 03/18/2020   Procedure: ESOPHAGOGASTRODUODENOSCOPY (EGD) WITH PROPOFOL;  Surgeon: Toney Reil, MD;  Location: [facility];  Service: Gastroenterology;  Laterality: N/A;  . GASTRIC BYPASS  2005  . INDUCED ABORTION N/A 1997   patient was about 22 weeks  . POLYPECTOMY  12/18/2019   Procedure: POLYPECTOMY;  Surgeon: Toney Reil, MD;  Location: [facility];  Service: Gastroenterology;;  . SHOULDER ARTHROSCOPY WITH OPEN ROTATOR CUFF REPAIR Left 06/14/2016   Procedure: SHOULDER ARTHROSCOPY WITH OPEN ROTATOR CUFF REPAIR, DECOMPRESSION, EXCISION LOOSE BODY, DEBRIDEMENT;  Surgeon: Christena Flake, MD;  Location: [facility];  Service: Orthopedics;  Laterality: Left;    Family History  Problem Relation Age of Onset  . Hypertension Mother   . Cancer Father        prostate  . Heart disease Brother   . Breast cancer Cousin 40       mat cousin    Social History:  reports that she has never smoked. She has never used smokeless tobacco. She reports that she does not drink alcohol and does not use drugs. 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  She lives in [place]. The patient is alone today.  Allergies:  Allergies  Allergen Reactions  . Lisinopril Swelling    Facial Swelling  . Ace  Inhibitors     angioedema  . Codeine Swelling  . Valsartan Swelling    Current Medications: Current Outpatient Medications  Medication Sig Dispense Refill  . Cyanocobalamin (B-12) 1000 MCG SUBL Place 1 tablet under the tongue daily. 30 each 5  . fluticasone (FLONASE) 50 MCG/ACT nasal spray Place 2 sprays into both nostrils daily. 16 g 0  . hydrochlorothiazide (HYDRODIURIL) 25 MG tablet Take 1 tablet (25 mg total) by  mouth daily. 90 tablet 1  . Loratadine 10 MG CAPS Take 1 capsule by mouth as needed.    [name] omeprazole (PRILOSEC) 40 MG capsule Take 1 capsule (40 mg total) by mouth daily. 90 capsule 0  . rosuvastatin (CRESTOR) 10 MG tablet Take 1 tablet (10 mg total) by mouth daily. 90 tablet 1  . SAXENDA 18 MG/3ML SOPN     . VENTOLIN HFA 108 (90 Base) MCG/ACT inhaler INHALE 2 PUFFS INTO THE LUNGS EVERY 6 HOURS AS NEEDED FOR WHEEZING OR SHORTNESS OF BREATH 18 g 0  . Vitamin D, Ergocalciferol, (DRISDOL) 1.25 MG (50000 UNIT) CAPS capsule Take 1 capsule (50,000 Units total) by mouth every 7 (seven) days. 12 capsule 1  . Insulin Pen Needle (NOVOFINE) 32G X 6 MM MISC 1 each by Does not apply route daily. (Patient not taking: Reported on 06/15/2020) 100 each 2  . nystatin cream (MYCOSTATIN) Apply 1 application topically 2 (two) times daily. (Patient not taking: Reported on 06/15/2020) 30 g 5   No current facility-administered medications for this visit.   Review of Systems  Constitutional: Negative for chills, diaphoresis, fever, malaise/fatigue and weight loss (up 13 lbs).       Feels "wonderful."  HENT: Negative.  Negative for congestion, ear discharge, ear pain, hearing loss, nosebleeds, sinus pain, sore throat and tinnitus.   Eyes: Negative.  Negative for blurred vision, double vision and photophobia.  Respiratory: Negative.  Negative for cough, hemoptysis, sputum production, shortness of breath and wheezing.   Cardiovascular: Negative.  Negative for chest pain, palpitations, orthopnea and leg swelling.  Gastrointestinal: Negative for abdominal pain, blood in stool, constipation, diarrhea, heartburn, melena, nausea and vomiting.       Last colonoscopy 12/18/2019.  Genitourinary: Negative.  Negative for dysuria, frequency and urgency.  Musculoskeletal: Positive for joint pain (hip bursitis; knee arthritis). Negative for back pain, myalgias (leg cramps at night, resolved) and neck pain.  Skin: Negative.  Negative  for itching and rash.  Neurological: Negative.  Negative for dizziness, tingling, sensory change, speech change, focal weakness, weakness and headaches.  Endo/Heme/Allergies: Negative.  Does not bruise/bleed easily.       Episodes of shakiness, possible low blood sugar  Psychiatric/Behavioral: Negative.  Negative for depression and memory loss. The patient is not nervous/anxious and does not have insomnia.   All other systems reviewed and are negative.  Performance status (ECOG): 1  Vitals Blood pressure (!) 173/93, pulse (!) 51, temperature (!) 97.3 F (36.3 C), temperature source Tympanic, resp. rate 18, height 5\' 7"  (1.702 m), weight 261 lb 10.3 oz (118.7 kg), SpO2 100 %.   Physical Exam Vitals and nursing note reviewed.  Constitutional:      General: She is not in acute distress.    Appearance: She is well-developed. She is not diaphoretic.     Interventions: Face mask in place.  HENT:     Head: Normocephalic and atraumatic.     Mouth/Throat:     Mouth: Mucous membranes are moist.     Pharynx: Oropharynx is clear. No  oropharyngeal exudate.  Eyes:     General: No scleral icterus.    Extraocular Movements: Extraocular movements intact.     Conjunctiva/sclera: Conjunctivae normal.     Pupils: Pupils are equal, round, and reactive to light.     Comments: Glasses.  Brown eyes.  Neck:     Vascular: No JVD.  Cardiovascular:     Rate and Rhythm: Normal rate and regular rhythm.     Heart sounds: Normal heart sounds. No murmur heard.   Pulmonary:     Effort: Pulmonary effort is normal. No respiratory distress.     Breath sounds: Normal breath sounds. No wheezing or rales.  Chest:     Chest wall: No tenderness.  Abdominal:     General: Bowel sounds are normal. There is no distension.     Palpations: Abdomen is soft. There is no mass.     Tenderness: There is no abdominal tenderness. There is no guarding or rebound.     Hernia: A hernia (umbilical, incisional) is present.   Musculoskeletal:        General: No swelling or tenderness. Normal range of motion.     Cervical back: Normal range of motion and neck supple.  Lymphadenopathy:     Head:     Right side of head: No preauricular, posterior auricular or occipital adenopathy.     Left side of head: No preauricular, posterior auricular or occipital adenopathy.     Cervical: No cervical adenopathy.     Upper Body:     Right upper body: No supraclavicular adenopathy.     Left upper body: No supraclavicular adenopathy.     Lower Body: No right inguinal adenopathy. No left inguinal adenopathy.  Skin:    General: Skin is warm and dry.     Coloration: Skin is not pale.     Findings: No erythema or rash.  Neurological:     Mental Status: She is alert and oriented to person, place, and time.  Psychiatric:        Behavior: Behavior normal.        Thought Content: Thought content normal.        Judgment: Judgment normal.    Appointment on 06/13/2020  Component Date Value Ref Range Status  . WBC 06/13/2020 6.5  4.0 - 10.5 K/uL Final  . RBC 06/13/2020 4.18  3.87 - 5.11 MIL/uL Final  . Hemoglobin 06/13/2020 12.8  12.0 - 15.0 g/dL Final  . HCT 16/10/960408/07/2020 37.2  36 - 46 % Final  . MCV 06/13/2020 89.0  80.0 - 100.0 fL Final  . MCH 06/13/2020 30.6  26.0 - 34.0 pg Final  . MCHC 06/13/2020 34.4  30.0 - 36.0 g/dL Final  . RDW 54/09/811908/07/2020 13.1  11.5 - 15.5 % Final  . Platelets 06/13/2020 256  150 - 400 K/uL Final  . nRBC 06/13/2020 0.0  0.0 - 0.2 % Final  . Neutrophils Relative % 06/13/2020 54  % Final  . Neutro Abs 06/13/2020 3.6  1.7 - 7.7 K/uL Final  . Lymphocytes Relative 06/13/2020 32  % Final  . Lymphs Abs 06/13/2020 2.1  0.7 - 4.0 K/uL Final  . Monocytes Relative 06/13/2020 8  % Final  . Monocytes Absolute 06/13/2020 0.5  0 - 1 K/uL Final  . Eosinophils Relative 06/13/2020 2  % Final  . Eosinophils Absolute 06/13/2020 0.1  0 - 0 K/uL Final  . Basophils Relative 06/13/2020 1  % Final  . Basophils Absolute  06/13/2020 0.0  0 -  0 K/uL Final  . Immature Granulocytes 06/13/2020 3  % Final  . Abs Immature Granulocytes 06/13/2020 0.19* 0.00 - 0.07 K/uL Final   Performed at [facility], [address] [place], [place] [address]  . Iron 06/13/2020 76  28 - 170 ug/dL Final  . TIBC 24/40/1027 407  250 - 450 ug/dL Final  . Saturation Ratios 06/13/2020 19  10.4 - 31.8 % Final  . UIBC 06/13/2020 331  ug/dL Final   Performed at [facility], [address], [place], [place] [address]  . Ferritin 06/13/2020 35  11 - 307 ng/mL Final   Performed at [facility], [address] [place] [place], [place] [address]    Assessment:  Savannah Manning is a 60 y.o. female s/p gastric bypass surgery (2005) with iron deficiency anemia and B12 deficiency.    She is s/p hysterectomy in 2012.  Last colonoscopy was on 08/10/2014.  She has received Feraheme: 11/11/2018 and 11/18/2018.  Ferritin has been followed: 8 on 10/12/2016, 11 on 05/01/2017, 7 on 11/03/2018, 173 on 01/16/2019, 70 on 07/24/2019, 65 on 10/28/2019, 49 on 12/28/2019, 40 on 03/23/2020 and 35 on 06/13/2020.   She receives Venofer if her ferritin is < 30.  She receives B12 monthly (last 05/18/2020).  She missed some B12 injections during COVID-19.  Folate was 8.6 on 10/28/2019.  EGD on 03/18/2020 revealed Roux-en-Y gastrojejunostomy with gastrojejunal anastomosis characterized by healthy appearing mucosa  The patient received the Moderna COVID-19 vaccine on 01/15/2020 and 02/16/2020.  Symptomatically, she feels "wonderful".  She denies any bleeding.  Exam is stable.  Plan: 1.   Review labs from 06/13/2020. 2.   Iron deficiency anemia             Hematocrit 37.2.  Hemoglobin 12.8.  MCV 89.0.             Ferritin 35 and iron saturation of 19% and a TIBC of 407.  No Venofer today.  Anticipate IV iron 3 months. 3.   B12 deficiency             Last B12 was on 05/18/2020.  B12 today and monthly x6. 4.   RTC in 3 months for  labs (CBC, ferritin) and B12. 5.   RTC in 6 months for MD assessment, labs (CBC with diff, ferritin, iron studies- day before), B12 and +/- Feraheme.  I discussed the assessment and treatment plan with the patient.  The patient was provided an opportunity to ask questions and all were answered.  The patient agreed with the plan and demonstrated an understanding of the instructions.  The patient was advised to call back if the symptoms worsen or if the condition fails to improve as anticipated.   Rosey Bath, MD, PhD    06/15/2020, 10:46 AM  I, Danella Penton Tufford, am acting as Neurosurgeon for [employer]. Merlene Pulling, MD, PhD.  I, Merland Holness C. Merlene Pulling, MD, have reviewed the above documentation for accuracy and completeness, and I agree with the above.

## 2020-06-15 ENCOUNTER — Inpatient Hospital Stay (HOSPITAL_BASED_OUTPATIENT_CLINIC_OR_DEPARTMENT_OTHER): Payer: BC Managed Care – PPO | Admitting: Hematology and Oncology

## 2020-06-15 ENCOUNTER — Inpatient Hospital Stay: Payer: BC Managed Care – PPO

## 2020-06-15 ENCOUNTER — Other Ambulatory Visit: Payer: Self-pay

## 2020-06-15 ENCOUNTER — Encounter: Payer: Self-pay | Admitting: Hematology and Oncology

## 2020-06-15 VITALS — BP 173/93 | HR 51 | Temp 97.3°F | Resp 18 | Ht 67.0 in | Wt 261.6 lb

## 2020-06-15 DIAGNOSIS — Z9884 Bariatric surgery status: Secondary | ICD-10-CM | POA: Diagnosis not present

## 2020-06-15 DIAGNOSIS — K909 Intestinal malabsorption, unspecified: Secondary | ICD-10-CM | POA: Diagnosis not present

## 2020-06-15 DIAGNOSIS — D508 Other iron deficiency anemias: Secondary | ICD-10-CM

## 2020-06-15 DIAGNOSIS — D509 Iron deficiency anemia, unspecified: Secondary | ICD-10-CM | POA: Diagnosis not present

## 2020-06-15 DIAGNOSIS — K9589 Other complications of other bariatric procedure: Secondary | ICD-10-CM

## 2020-06-15 DIAGNOSIS — E538 Deficiency of other specified B group vitamins: Secondary | ICD-10-CM | POA: Diagnosis not present

## 2020-06-15 MED ORDER — CYANOCOBALAMIN 1000 MCG/ML IJ SOLN
1000.0000 ug | Freq: Once | INTRAMUSCULAR | Status: AC
  Administered 2020-06-15: 1000 ug via INTRAMUSCULAR

## 2020-06-15 NOTE — Progress Notes (Signed)
The patient b/p today 173/93

## 2020-06-20 ENCOUNTER — Other Ambulatory Visit: Payer: Self-pay | Admitting: Family Medicine

## 2020-06-20 DIAGNOSIS — K219 Gastro-esophageal reflux disease without esophagitis: Secondary | ICD-10-CM

## 2020-06-23 ENCOUNTER — Other Ambulatory Visit: Payer: Self-pay | Admitting: Family Medicine

## 2020-06-23 DIAGNOSIS — K219 Gastro-esophageal reflux disease without esophagitis: Secondary | ICD-10-CM

## 2020-07-13 ENCOUNTER — Inpatient Hospital Stay: Payer: BC Managed Care – PPO

## 2020-07-13 ENCOUNTER — Other Ambulatory Visit: Payer: Self-pay

## 2020-07-13 ENCOUNTER — Inpatient Hospital Stay: Payer: BC Managed Care – PPO | Attending: Hematology and Oncology

## 2020-07-13 DIAGNOSIS — E538 Deficiency of other specified B group vitamins: Secondary | ICD-10-CM | POA: Insufficient documentation

## 2020-07-13 DIAGNOSIS — K909 Intestinal malabsorption, unspecified: Secondary | ICD-10-CM

## 2020-07-13 MED ORDER — CYANOCOBALAMIN 1000 MCG/ML IJ SOLN
INTRAMUSCULAR | Status: AC
  Filled 2020-07-13: qty 1

## 2020-07-13 MED ORDER — CYANOCOBALAMIN 1000 MCG/ML IJ SOLN
1000.0000 ug | Freq: Once | INTRAMUSCULAR | Status: AC
  Administered 2020-07-13: 1000 ug via INTRAMUSCULAR

## 2020-07-18 NOTE — Progress Notes (Signed)
Name: Savannah Manning   MRN: [id number]    DOB: Sep 17, 1961   Date:07/20/2020       Progress Note  Subjective  Chief Complaint  Follow up   HPI   Obesity/prediabetes:. She has been obese since childhood. She has tried multiple diets in the past, she hadgastric bypass in 2005. Weight before surgery was 314 lbs, she went down to 164 lbs, but has been gradually gaining weight. She states her weight has gone up since she started to work from home in  2010.She had [facility] at [facility],and was denied for a revision in the Summer 2018. She has failed Metformin, started her on Ozempic  04/2017 her  weight was 259 lbs, her weight went as low as 239 lbs however today is up to 265  lbs. Her insurance has denied Ozempic, only paying for patients that have DM. She is now on Saxenda but does not like daily injection, so she has not been compliant lately. Discussed dietary modification   Chronic knee and also hip pain: she has gained weight and has noticed worsening of symptoms.   History of iron deficiency anemia and B12 deficiency: she is under the care of Dr. Merlene Pulling and is getting monthly B12. Last iron storage was normal   Insomnia: she has not been taking Trazodone, her son is in college - [employer] and worries about him but also states does not like being at home alone at night. States her son still calls her at night worried and he moves on and she worries about him    GERD: she is doing better on Omeprazole BID, seeing Dr. Allegra Lai tomorrow and will ask for a refill tomorrow.   HTN: bp has been elevated here and also at hematologist. She denies chest pain or palpitation. We will change from HCTZ 25 to Maxzide 37.5/25   Vitamin D deficiency: continue rx vitamin D, unchanged   Dumping syndrome: she had one episode of abdominal pain after a meal, intermittent, less than once a month, resolves after an episode of diarrhea, likely dumping advised to discuss with gi and keep a diary of  food that triggers symptoms. Same   Dysthymia: father has dementia, mother had hip surgery, son is in [place], she worries and feels overwhelmed, but does not want any new medications at this time  Patient Active Problem List   Diagnosis Date Noted  . Primary osteoarthritis of right knee 04/06/2020  . Primary osteoarthritis of left knee 04/06/2020  . Pre-diabetes 12/05/2018  . Malabsorption of iron 11/07/2018  . Chronic pain of right knee 07/26/2017  . Vitamin D deficiency 04/24/2017  . B12 deficiency 04/24/2017  . Abnormal mammogram of right breast 01/01/2017  . Asthma, mild persistent 12/17/2016  . History of shoulder surgery 10/12/2016  . Primary osteoarthritis of left shoulder 06/14/2016  . Incomplete tear of left rotator cuff 05/30/2016  . Rotator cuff tendinitis 05/30/2016  . History of bariatric surgery 05/04/2016  . Allergic rhinitis 05/12/2015  . Benign essential HTN 05/12/2015  . Anxiety and depression 05/12/2015  . Insomnia, persistent 05/12/2015  . Dyslipidemia 05/12/2015  . Edema extremities 05/12/2015  . Gastroesophageal reflux disease without esophagitis 05/12/2015  . Hypoglycemia 05/12/2015  . Eczema intertrigo 05/12/2015  . Migraine 05/12/2015  . Plantar fasciitis 05/12/2015  . Umbilical hernia without obstruction and without gangrene 05/12/2015  . Morbid obesity, unspecified obesity type (HCC) 02/13/2008    Past Surgical History:  Procedure Laterality Date  . ABDOMINAL HYSTERECTOMY  2012  .  CHOLECYSTECTOMY    . COLONOSCOPY WITH PROPOFOL N/A 12/18/2019   Procedure: COLONOSCOPY WITH PROPOFOL;  Surgeon: Toney ReilVanga, Rohini Reddy, MD;  [facility];  Service: Gastroenterology;  Laterality: N/A;  . DILATION AND CURETTAGE OF UTERUS    . ESOPHAGOGASTRODUODENOSCOPY (EGD) WITH PROPOFOL N/A 03/18/2020   Procedure: ESOPHAGOGASTRODUODENOSCOPY (EGD) WITH PROPOFOL;  Surgeon: Toney ReilVanga, Rohini Reddy, MD;  [facility];  Service: Gastroenterology;   Laterality: N/A;  . GASTRIC BYPASS  2005  . INDUCED ABORTION N/A 1997   patient was about 22 weeks  . POLYPECTOMY  12/18/2019   Procedure: POLYPECTOMY;  Surgeon: Toney ReilVanga, Rohini Reddy, MD;  [facility];  Service: Gastroenterology;;  . SHOULDER ARTHROSCOPY WITH OPEN ROTATOR CUFF REPAIR Left 06/14/2016   Procedure: SHOULDER ARTHROSCOPY WITH OPEN ROTATOR CUFF REPAIR, DECOMPRESSION, EXCISION LOOSE BODY, DEBRIDEMENT;  Surgeon: Christena FlakeJohn J Poggi, MD;  Location: [facility];  Service: Orthopedics;  Laterality: Left;    Family History  Problem Relation Age of Onset  . Hypertension Mother   . Cancer Father        prostate  . Heart disease Brother   . Breast cancer Cousin 40       mat cousin    Social History   Tobacco Use  . Smoking status: Never Smoker  . Smokeless tobacco: Never Used  Substance Use Topics  . Alcohol use: No    Alcohol/week: 0.0 standard drinks     Current Outpatient Medications:  .  Cyanocobalamin (B-12) 1000 MCG SUBL, Place 1 tablet under the tongue daily., Disp: 30 each, Rfl: 5 .  fluticasone (FLONASE) 50 MCG/ACT nasal spray, Place 2 sprays into both nostrils daily., Disp: 16 g, Rfl: 0 .  hydrochlorothiazide (HYDRODIURIL) 25 MG tablet, Take 1 tablet (25 mg total) by mouth daily., Disp: 90 tablet, Rfl: 1 .  Insulin Pen Needle (NOVOFINE) 32G X 6 MM MISC, 1 each by Does not apply route daily., Disp: 100 each, Rfl: 2 .  Loratadine 10 MG CAPS, Take 1 capsule by mouth as needed., Disp: , Rfl:  .  nystatin cream (MYCOSTATIN), Apply 1 application topically 2 (two) times daily., Disp: 30 g, Rfl: 5 .  omeprazole (PRILOSEC) 40 MG capsule, TAKE 1 CAPSULE(40 MG) BY MOUTH DAILY, Disp: 90 capsule, Rfl: 0 .  rosuvastatin (CRESTOR) 10 MG tablet, Take 1 tablet (10 mg total) by mouth daily., Disp: 90 tablet, Rfl: 1 .  SAXENDA 18 MG/3ML SOPN, , Disp: , Rfl:  .  VENTOLIN HFA 108 (90 Base) MCG/ACT inhaler, INHALE 2 PUFFS INTO THE LUNGS EVERY 6 HOURS AS NEEDED FOR WHEEZING OR  SHORTNESS OF BREATH, Disp: 18 g, Rfl: 0 .  Vitamin D, Ergocalciferol, (DRISDOL) 1.25 MG (50000 UNIT) CAPS capsule, Take 1 capsule (50,000 Units total) by mouth every 7 (seven) days., Disp: 12 capsule, Rfl: 1  Allergies  Allergen Reactions  . Lisinopril Swelling    Facial Swelling  . Ace Inhibitors     angioedema  . Codeine Swelling  . Valsartan Swelling    I personally reviewed active problem list, medication list, allergies, family history, social history, health maintenance with the patient/caregiver today.   ROS  Constitutional: Negative for fever , positive for  weight change.  Respiratory: Negative for cough and shortness of breath.   Cardiovascular: Negative for chest pain or palpitations.  Gastrointestinal: Negative for abdominal pain, no bowel changes.  Musculoskeletal: Negative for gait problem or joint swelling.  Skin: Negative for rash.  Neurological: Negative for dizziness or headache.  No other  specific complaints in a complete review of systems (except as listed in HPI above).  Objective  Vitals:   07/20/20 1359  BP: (!) 150/94  Pulse: 67  Resp: 16  Temp: 98.2 F (36.8 C)  TempSrc: Oral  SpO2: 100%  Weight: 265 lb 3.2 oz (120.3 kg)  Height: 5\' 7"  (1.702 m)    Body mass index is 41.54 kg/m.  Physical Exam  Constitutional: Patient appears well-developed and well-nourished. Obese  No distress.  HEENT: head atraumatic, normocephalic, pupils equal and reactive to light,, neck supple Cardiovascular: Normal rate, regular rhythm and normal heart sounds.  No murmur heard. No BLE edema. Pulmonary/Chest: Effort normal and breath sounds normal. No respiratory distress. Abdominal: Soft.  There is no tenderness. Psychiatric: Patient has a normal mood and affect. behavior is normal. Judgment and thought content normal.  Recent Results (from the past 2160 hour(s))  CBC with Differential/Platelet     Status: Abnormal   Collection Time: 06/13/20  2:15 PM  Result  Value Ref Range   WBC 6.5 4.0 - 10.5 K/uL   RBC 4.18 3.87 - 5.11 MIL/uL   Hemoglobin 12.8 12.0 - 15.0 g/dL   HCT 08/13/20 36 - 46 %   MCV 89.0 80.0 - 100.0 fL   MCH 30.6 26.0 - 34.0 pg   MCHC 34.4 30.0 - 36.0 g/dL   RDW 82.9 56.2 - 13.0 %   Platelets 256 150 - 400 K/uL   nRBC 0.0 0.0 - 0.2 %   Neutrophils Relative % 54 %   Neutro Abs 3.6 1.7 - 7.7 K/uL   Lymphocytes Relative 32 %   Lymphs Abs 2.1 0.7 - 4.0 K/uL   Monocytes Relative 8 %   Monocytes Absolute 0.5 0 - 1 K/uL   Eosinophils Relative 2 %   Eosinophils Absolute 0.1 0 - 0 K/uL   Basophils Relative 1 %   Basophils Absolute 0.0 0 - 0 K/uL   Immature Granulocytes 3 %   Abs Immature Granulocytes 0.19 (H) 0.00 - 0.07 K/uL    Comment: Performed at [facility], [address] [place], [place]  Iron and TIBC     Status: None   Collection Time: 06/13/20  2:15 PM  Result Value Ref Range   Iron 76 28 - 170 ug/dL   TIBC 08/13/20 629 - 528 ug/dL   Saturation Ratios 19 10.4 - 31.8 %   UIBC 331 ug/dL    Comment: Performed at [facility], [address] [place], [place]  Ferritin     Status: None   Collection Time: 06/13/20  2:15 PM  Result Value Ref Range   Ferritin 35 11 - 307 ng/mL    Comment: Performed at [facility], [address] [place], [place]      PHQ2/9: Depression screen [facility] 2/9 07/20/2020 02/26/2020 12/04/2019 06/26/2019 03/05/2019  Decreased Interest 1 0 0 0 0  Down, Depressed, Hopeless 1 0 0 0 0  PHQ - 2 Score 2 0 0 0 0  Altered sleeping 3 0 0 0 0  Tired, decreased energy 2 0 0 0 0  Change in appetite 2 0 0 0 0  Feeling bad or failure about yourself  1 0 0 0 0  Trouble concentrating 1 0 0 0 0  Moving slowly or fidgety/restless 0 0 0 0 0  Suicidal thoughts 0 0 0 0 0  PHQ-9 Score 11 0 0 0 0  Difficult doing work/chores Not difficult  at all - - - -    phq 9 is positive   Fall Risk: Fall Risk  07/20/2020 02/26/2020 12/04/2019 06/26/2019 03/05/2019   Falls in the past year? 0 0 0 0 0  Number falls in past yr: 0 0 0 0 0  Injury with Fall? 0 0 0 1 0  Follow up - - - - -     Functional Status Survey: Is the patient deaf or have difficulty hearing?: No Does the patient have difficulty seeing, even when wearing glasses/contacts?: No Does the patient have difficulty concentrating, remembering, or making decisions?: No Does the patient have difficulty walking or climbing stairs?: No Does the patient have difficulty dressing or bathing?: No Does the patient have difficulty doing errands alone such as visiting a doctor's office or shopping?: No    Assessment & Plan  1. Morbid obesity with BMI of 40.0-44.9, adult (HCC)  BMI of 41   2. Gastroesophageal reflux disease without esophagitis   3. Vitamin D deficiency  - Vitamin D, Ergocalciferol, (DRISDOL) 1.25 MG (50000 UNIT) CAPS capsule; Take 1 capsule (50,000 Units total) by mouth every 7 (seven) days.  Dispense: 12 capsule; Refill: 1  4. Dyslipidemia  - rosuvastatin (CRESTOR) 10 MG tablet; Take 1 tablet (10 mg total) by mouth daily.  Dispense: 90 tablet; Refill: 1  5. Benign essential HTN  - triamterene-hydrochlorothiazide (MAXZIDE-25) 37.5-25 MG tablet; Take 1 tablet by mouth daily. In place of HCTZ 25 , BP medication  Dispense: 90 tablet; Refill: 0  6. B12 deficiency   7. History of bariatric surgery  Working from home, difficulty using Saxenda daily, we discussed keto and Optavia diet   8. Pre-diabetes   9. Mild intermittent asthma without complication  stable  10. Hyperglycemia  Recheck labs yearly   11. Need for immunization against influenza  - Flu Vaccine QUAD 36+ mos IM  12. History of iron deficiency  Seeing Dr. Merlene Pulling   13. Chronic pain of right knee  Getting worse because of weight gain   14. Trochanteric bursitis of left hip

## 2020-07-20 ENCOUNTER — Other Ambulatory Visit: Payer: Self-pay

## 2020-07-20 ENCOUNTER — Encounter: Payer: Self-pay | Admitting: Family Medicine

## 2020-07-20 ENCOUNTER — Ambulatory Visit (INDEPENDENT_AMBULATORY_CARE_PROVIDER_SITE_OTHER): Payer: BC Managed Care – PPO | Admitting: Family Medicine

## 2020-07-20 VITALS — BP 150/94 | HR 67 | Temp 98.2°F | Resp 16 | Ht 67.0 in | Wt 265.2 lb

## 2020-07-20 DIAGNOSIS — R7303 Prediabetes: Secondary | ICD-10-CM

## 2020-07-20 DIAGNOSIS — R739 Hyperglycemia, unspecified: Secondary | ICD-10-CM

## 2020-07-20 DIAGNOSIS — Z6841 Body Mass Index (BMI) 40.0 and over, adult: Secondary | ICD-10-CM

## 2020-07-20 DIAGNOSIS — I10 Essential (primary) hypertension: Secondary | ICD-10-CM

## 2020-07-20 DIAGNOSIS — G8929 Other chronic pain: Secondary | ICD-10-CM

## 2020-07-20 DIAGNOSIS — M25561 Pain in right knee: Secondary | ICD-10-CM

## 2020-07-20 DIAGNOSIS — E785 Hyperlipidemia, unspecified: Secondary | ICD-10-CM

## 2020-07-20 DIAGNOSIS — Z23 Encounter for immunization: Secondary | ICD-10-CM | POA: Diagnosis not present

## 2020-07-20 DIAGNOSIS — E559 Vitamin D deficiency, unspecified: Secondary | ICD-10-CM | POA: Diagnosis not present

## 2020-07-20 DIAGNOSIS — Z9884 Bariatric surgery status: Secondary | ICD-10-CM

## 2020-07-20 DIAGNOSIS — M7062 Trochanteric bursitis, left hip: Secondary | ICD-10-CM

## 2020-07-20 DIAGNOSIS — K219 Gastro-esophageal reflux disease without esophagitis: Secondary | ICD-10-CM

## 2020-07-20 DIAGNOSIS — J4520 Mild intermittent asthma, uncomplicated: Secondary | ICD-10-CM

## 2020-07-20 DIAGNOSIS — Z8639 Personal history of other endocrine, nutritional and metabolic disease: Secondary | ICD-10-CM

## 2020-07-20 DIAGNOSIS — E538 Deficiency of other specified B group vitamins: Secondary | ICD-10-CM

## 2020-07-20 MED ORDER — ROSUVASTATIN CALCIUM 10 MG PO TABS: 10.0000 mg | ORAL_TABLET | Freq: Every day | ORAL | 1 refills | Status: DC

## 2020-07-20 MED ORDER — VITAMIN D (ERGOCALCIFEROL) 1.25 MG (50000 UNIT) PO CAPS: 50000.0000 [IU] | ORAL_CAPSULE | ORAL | 1 refills | Status: DC

## 2020-07-20 MED ORDER — TRIAMTERENE-HCTZ 37.5-25 MG PO TABS: 1.0000 | ORAL_TABLET | Freq: Every day | ORAL | 0 refills | Status: DC

## 2020-07-20 NOTE — Patient Instructions (Signed)
Please ask Dr. Allegra Lai if you need to continue bid of Omeprazole and if so ask for a prescription

## 2020-07-21 ENCOUNTER — Ambulatory Visit (INDEPENDENT_AMBULATORY_CARE_PROVIDER_SITE_OTHER): Payer: BC Managed Care – PPO | Admitting: Gastroenterology

## 2020-07-21 ENCOUNTER — Encounter: Payer: Self-pay | Admitting: Gastroenterology

## 2020-07-21 DIAGNOSIS — K219 Gastro-esophageal reflux disease without esophagitis: Secondary | ICD-10-CM

## 2020-07-21 MED ORDER — AMITRIPTYLINE HCL 25 MG PO TABS: 25.0000 mg | ORAL_TABLET | Freq: Every day | ORAL | 0 refills | Status: DC

## 2020-07-21 MED ORDER — OMEPRAZOLE 40 MG PO CPDR: DELAYED_RELEASE_CAPSULE | ORAL | 1 refills | Status: DC

## 2020-07-21 NOTE — Progress Notes (Signed)
Arlyss Repress, MD [address]  [address]  [place], [place] [address]  Main: [phone]  Fax: [phone]    Gastroenterology Consultation  Referring Provider:     Alba Cory, MD Primary Care Physician:  Alba Cory, MD Primary Gastroenterologist:  Dr. Arlyss Repress Reason for Consultation:     Chronic GERD        HPI:   Savannah Manning is a 59 y.o. female referred by Dr. Alba Cory, MD  for consultation & management of chronic GERD.  Patient reports that she has been experiencing symptoms including epigastric burning pain, burning chest pain, regurgitation, sour taste in mouth, particularly at bedtime, laying down activated with fatty foods.  She was taking omeprazole as needed for several years.  She saw Dr. Carlynn Purl last week, she has been prescribed to take omeprazole 40 mg daily.  Patient reports that her symptoms are better since then.  She also reports sensation of food stuck in her lower chest.  Follow-up visit 04/20/2020 Patient reports that her nocturnal GERD symptoms have significantly improved after she started using a wedge pillow.  She is taking omeprazole 40 mg once a day before breakfast.  She reports her daytime symptoms are intermittent.  She continues to gain weight.  Her EGD was unremarkable  Follow-up visit 07/21/2020 Patient continues to gain weight.  She reports that she has been significantly stressed out with work note and has been eating late at night.  Also, she is taking care of her parents.  She is less ambulatory due to her job stress.  Her blood pressure is not well controlled either.  She is having intermittent episodes of in the morning heartburn with nausea.  She is on Prilosec 40 mg twice daily.  She is not sleeping well  She does not smoke or drink alcohol  NSAIDs: None  Antiplts/Anticoagulants/Anti thrombotics: None  GI Procedures:  Colonoscopy 12/18/2019 - One 3 mm polyp in the transverse colon, removed with a cold snare.  Complete resection. Polyp tissue not retrieved. - The distal rectum and anal verge are normal on retroflexion view. - The examination was otherwise normal.  Upper endoscopy 03/28/2017 A. Stomach, rule out Helicobacter pylori, endoscopic biopsy:  Gastric oxyntic mucosa with mild chronic gastritis. No active gastritis is seen. No Helicobacter pylori organisms are seen on routine H&E stain.  EGD 03/18/20 - Roux-en-Y gastrojejunostomy with gastrojejunal anastomosis characterized by healthy appearing mucosa. - Normal gastroesophageal junction and esophagus. - No specimens collected.  Past Medical History:  Diagnosis Date  . Anemia   . Arthritis   . Asthma   . GERD (gastroesophageal reflux disease)   . Headache   . Hypertension   . Iron deficiency anemia 12/05/2018  . Wears contact lenses     Past Surgical History:  Procedure Laterality Date  . ABDOMINAL HYSTERECTOMY  2012  . CHOLECYSTECTOMY    . COLONOSCOPY WITH PROPOFOL N/A 12/18/2019   Procedure: COLONOSCOPY WITH PROPOFOL;  Surgeon: Toney Reil, MD;  [facility];  Service: Gastroenterology;  Laterality: N/A;  . DILATION AND CURETTAGE OF UTERUS    . ESOPHAGOGASTRODUODENOSCOPY (EGD) WITH PROPOFOL N/A 03/18/2020   Procedure: ESOPHAGOGASTRODUODENOSCOPY (EGD) WITH PROPOFOL;  Surgeon: Toney Reil, MD;  Location: [facility];  Service: Gastroenterology;  Laterality: N/A;  . GASTRIC BYPASS  2005  . INDUCED ABORTION N/A 1997   patient was about 22 weeks  . POLYPECTOMY  12/18/2019   Procedure: POLYPECTOMY;  Surgeon: Toney Reil, MD;  Location:  [facility];  Service: Gastroenterology;;  . SHOULDER ARTHROSCOPY WITH OPEN ROTATOR CUFF REPAIR Left 06/14/2016   Procedure: SHOULDER ARTHROSCOPY WITH OPEN ROTATOR CUFF REPAIR, DECOMPRESSION, EXCISION LOOSE BODY, DEBRIDEMENT;  Surgeon: Christena Flake, MD;  Location: [facility];  Service: Orthopedics;  Laterality: Left;    Current Outpatient Medications:   .  Cyanocobalamin (B-12) 1000 MCG SUBL, Place 1 tablet under the tongue daily., Disp: 30 each, Rfl: 5 .  fluticasone (FLONASE) 50 MCG/ACT nasal spray, Place 2 sprays into both nostrils daily., Disp: 16 g, Rfl: 0 .  Insulin Pen Needle (NOVOFINE) 32G X 6 MM MISC, 1 each by Does not apply route daily., Disp: 100 each, Rfl: 2 .  Loratadine 10 MG CAPS, Take 1 capsule by mouth as needed., Disp: , Rfl:  .  nystatin cream (MYCOSTATIN), Apply 1 application topically 2 (two) times daily., Disp: 30 g, Rfl: 5 .  omeprazole (PRILOSEC) 40 MG capsule, TAKE 1 CAPSULE(40 MG) BY two times a day before meals, Disp: 180 capsule, Rfl: 1 .  rosuvastatin (CRESTOR) 10 MG tablet, Take 1 tablet (10 mg total) by mouth daily., Disp: 90 tablet, Rfl: 1 .  SAXENDA 18 MG/3ML SOPN, , Disp: , Rfl:  .  triamterene-hydrochlorothiazide (MAXZIDE-25) 37.5-25 MG tablet, Take 1 tablet by mouth daily. In place of HCTZ 25 , BP medication, Disp: 90 tablet, Rfl: 0 .  VENTOLIN HFA 108 (90 Base) MCG/ACT inhaler, INHALE 2 PUFFS INTO THE LUNGS EVERY 6 HOURS AS NEEDED FOR WHEEZING OR SHORTNESS OF BREATH, Disp: 18 g, Rfl: 0 .  Vitamin D, Ergocalciferol, (DRISDOL) 1.25 MG (50000 UNIT) CAPS capsule, Take 1 capsule (50,000 Units total) by mouth every 7 (seven) days., Disp: 12 capsule, Rfl: 1 .  amitriptyline (ELAVIL) 25 MG tablet, Take 1 tablet (25 mg total) by mouth at bedtime., Disp: 30 tablet, Rfl: 0   Family History  Problem Relation Age of Onset  . Hypertension Mother   . Cancer Father        prostate  . Heart disease Brother   . Breast cancer Cousin 40       mat cousin     Social History   Tobacco Use  . Smoking status: Never Smoker  . Smokeless tobacco: Never Used  Vaping Use  . Vaping Use: Never used  Substance Use Topics  . Alcohol use: No    Alcohol/week: 0.0 standard drinks  . Drug use: No    Allergies as of 07/21/2020 - Review Complete 07/21/2020  Allergen Reaction Noted  . Lisinopril Swelling 05/13/2015  . Ace  inhibitors  04/19/2015  . Codeine Swelling 04/19/2015  . Valsartan Swelling 05/13/2015    Review of Systems:    All systems reviewed and negative except where noted in HPI.   Physical Exam:  BP (!) 144/83 (BP Location: Left Arm, Patient Position: Sitting, Cuff Size: Large)   Pulse 67   Temp 97.7 F (36.5 C) (Oral)   Wt 265 lb (120.2 kg)   BMI 41.50 kg/m  No LMP recorded. Patient has had a hysterectomy.  General:   Alert,  Well-developed, well-nourished, pleasant and cooperative in NAD Head:  Normocephalic and atraumatic. Eyes:  Sclera clear, no icterus.   Conjunctiva pink. Ears:  Normal auditory acuity. Nose:  No deformity, discharge, or lesions. Mouth:  No deformity or lesions,oropharynx pink & moist. Neck:  Supple; no masses or thyromegaly. Lungs:  Respirations even and unlabored.  Clear throughout to auscultation.   No wheezes, crackles, or rhonchi. No acute distress.  Heart:  Regular rate and rhythm; no murmurs, clicks, rubs, or gallops. Abdomen:  Normal bowel sounds. Soft, obese non-tender and non-distended without masses, hepatosplenomegaly or hernias noted.  No guarding or rebound tenderness.   Rectal: Not performed Msk:  Symmetrical without gross deformities. Good, equal movement & strength bilaterally. Pulses:  Normal pulses noted. Extremities:  No clubbing or edema.  No cyanosis. Neurologic:  Alert and oriented x3;  grossly normal neurologically. Skin:  Intact without significant lesions or rashes. No jaundice. Psych:  Alert and cooperative. Normal mood and affect.  Imaging Studies: No abdominal imaging  Assessment and Plan:   Savannah Manning is a 59 y.o. female with morbid obesity, BMI 39.8, history of Roux-en-Y gastric bypass, metabolic syndrome is seen for follow-up of chronic GERD  Chronic GERD: Exacerbated by stress EGD normal Reiterated on antireflux lifestyle, discussed about healthy eating habits Continue prilosec 40 mg to 2 times a day  Stress, weight  gain Trial of amitriptyline 25 mg at bedtime, will increase to 50 mg if she is able to tolerate   Follow up in 3 months   Arlyss Repress, MD

## 2020-08-10 ENCOUNTER — Other Ambulatory Visit: Payer: Self-pay

## 2020-08-10 ENCOUNTER — Inpatient Hospital Stay: Payer: BC Managed Care – PPO | Attending: Hematology and Oncology

## 2020-08-10 DIAGNOSIS — E538 Deficiency of other specified B group vitamins: Secondary | ICD-10-CM | POA: Diagnosis not present

## 2020-08-10 DIAGNOSIS — K909 Intestinal malabsorption, unspecified: Secondary | ICD-10-CM

## 2020-08-10 MED ORDER — CYANOCOBALAMIN 1000 MCG/ML IJ SOLN
1000.0000 ug | Freq: Once | INTRAMUSCULAR | Status: AC
  Administered 2020-08-10: 1000 ug via INTRAMUSCULAR

## 2020-08-10 MED ORDER — CYANOCOBALAMIN 1000 MCG/ML IJ SOLN
INTRAMUSCULAR | Status: AC
  Filled 2020-08-10: qty 1

## 2020-08-30 ENCOUNTER — Ambulatory Visit: Payer: BC Managed Care – PPO | Admitting: Family Medicine

## 2020-09-02 ENCOUNTER — Other Ambulatory Visit: Payer: Self-pay

## 2020-09-02 DIAGNOSIS — K909 Intestinal malabsorption, unspecified: Secondary | ICD-10-CM

## 2020-09-07 ENCOUNTER — Other Ambulatory Visit: Payer: Self-pay

## 2020-09-07 ENCOUNTER — Inpatient Hospital Stay: Payer: BC Managed Care – PPO | Attending: Hematology and Oncology

## 2020-09-07 ENCOUNTER — Inpatient Hospital Stay: Payer: BC Managed Care – PPO

## 2020-09-07 DIAGNOSIS — Z79899 Other long term (current) drug therapy: Secondary | ICD-10-CM | POA: Insufficient documentation

## 2020-09-07 DIAGNOSIS — D508 Other iron deficiency anemias: Secondary | ICD-10-CM | POA: Diagnosis not present

## 2020-09-07 DIAGNOSIS — Z8042 Family history of malignant neoplasm of prostate: Secondary | ICD-10-CM | POA: Diagnosis not present

## 2020-09-07 DIAGNOSIS — Z803 Family history of malignant neoplasm of breast: Secondary | ICD-10-CM | POA: Diagnosis not present

## 2020-09-07 DIAGNOSIS — E538 Deficiency of other specified B group vitamins: Secondary | ICD-10-CM | POA: Diagnosis not present

## 2020-09-07 DIAGNOSIS — Z90710 Acquired absence of both cervix and uterus: Secondary | ICD-10-CM | POA: Insufficient documentation

## 2020-09-07 DIAGNOSIS — I10 Essential (primary) hypertension: Secondary | ICD-10-CM | POA: Diagnosis not present

## 2020-09-07 DIAGNOSIS — Z9884 Bariatric surgery status: Secondary | ICD-10-CM | POA: Insufficient documentation

## 2020-09-07 DIAGNOSIS — Z8249 Family history of ischemic heart disease and other diseases of the circulatory system: Secondary | ICD-10-CM | POA: Insufficient documentation

## 2020-09-07 DIAGNOSIS — K909 Intestinal malabsorption, unspecified: Secondary | ICD-10-CM

## 2020-09-07 LAB — CBC WITH DIFFERENTIAL/PLATELET
Abs Immature Granulocytes: 0.01 10*3/uL (ref 0.00–0.07)
Basophils Absolute: 0 10*3/uL (ref 0.0–0.1)
Basophils Relative: 1 %
Eosinophils Absolute: 0.1 10*3/uL (ref 0.0–0.5)
Eosinophils Relative: 2 %
HCT: 38.5 % (ref 36.0–46.0)
Hemoglobin: 13.1 g/dL (ref 12.0–15.0)
Immature Granulocytes: 0 %
Lymphocytes Relative: 33 %
Lymphs Abs: 2.3 10*3/uL (ref 0.7–4.0)
MCH: 30.2 pg (ref 26.0–34.0)
MCHC: 34 g/dL (ref 30.0–36.0)
MCV: 88.7 fL (ref 80.0–100.0)
Monocytes Absolute: 0.5 10*3/uL (ref 0.1–1.0)
Monocytes Relative: 7 %
Neutro Abs: 3.9 10*3/uL (ref 1.7–7.7)
Neutrophils Relative %: 57 %
Platelets: 260 10*3/uL (ref 150–400)
RBC: 4.34 MIL/uL (ref 3.87–5.11)
RDW: 13 % (ref 11.5–15.5)
WBC: 6.8 10*3/uL (ref 4.0–10.5)
nRBC: 0 % (ref 0.0–0.2)

## 2020-09-07 LAB — FERRITIN: Ferritin: 35 ng/mL (ref 11–307)

## 2020-09-07 MED ORDER — CYANOCOBALAMIN 1000 MCG/ML IJ SOLN
INTRAMUSCULAR | Status: AC
  Filled 2020-09-07: qty 1

## 2020-09-07 MED ORDER — CYANOCOBALAMIN 1000 MCG/ML IJ SOLN
1000.0000 ug | Freq: Once | INTRAMUSCULAR | Status: AC
  Administered 2020-09-07: 1000 ug via INTRAMUSCULAR

## 2020-09-09 NOTE — Progress Notes (Signed)
Name: Savannah Manning   MRN: [id number]    DOB: May 19, 1961   Date:09/12/2020       Progress Note  Subjective  Chief Complaint  Follow up   HPI     HTN: bp has been elevated here and also at hematologist. She denies chest pain or palpitation. We will changed from HCTZ to Triamterene HCTZ but bp is still high, she states she changed job positions - but not by choice. Still working with [employer], she is feeling overwhelmed. Not sleeping at night and working long hours. We will switch bp medication to Benicar HCTZ and she will return for bp check with CMA   Dysthymia/insomnia: she has a new position at work, feels overwhelmed, she is willing to take medication to help her sleep, since Trazodone is not working well. She is also stressed at work ( financial position), financial stress ( difficulty paying for her mortgage )   GERD: she ate pizza a few days ago and is still having heartburn and indigestion, discussed adding Pepcid for a few days and be more strict with her diet for now   Patient Active Problem List   Diagnosis Date Noted  . Primary osteoarthritis of right knee 04/06/2020  . Primary osteoarthritis of left knee 04/06/2020  . Pre-diabetes 12/05/2018  . Malabsorption of iron 11/07/2018  . Chronic pain of right knee 07/26/2017  . Vitamin D deficiency 04/24/2017  . B12 deficiency 04/24/2017  . Abnormal mammogram of right breast 01/01/2017  . Asthma, mild persistent 12/17/2016  . History of shoulder surgery 10/12/2016  . Primary osteoarthritis of left shoulder 06/14/2016  . Incomplete tear of left rotator cuff 05/30/2016  . Rotator cuff tendinitis 05/30/2016  . History of bariatric surgery 05/04/2016  . Allergic rhinitis 05/12/2015  . Benign essential HTN 05/12/2015  . Anxiety and depression 05/12/2015  . Insomnia, persistent 05/12/2015  . Dyslipidemia 05/12/2015  . Edema extremities 05/12/2015  . Gastroesophageal reflux disease without esophagitis 05/12/2015   . Hypoglycemia 05/12/2015  . Eczema intertrigo 05/12/2015  . Migraine 05/12/2015  . Plantar fasciitis 05/12/2015  . Umbilical hernia without obstruction and without gangrene 05/12/2015  . Morbid obesity, unspecified obesity type (HCC) 02/13/2008    Past Surgical History:  Procedure Laterality Date  . ABDOMINAL HYSTERECTOMY  2012  . CHOLECYSTECTOMY    . COLONOSCOPY WITH PROPOFOL N/A 12/18/2019   Procedure: COLONOSCOPY WITH PROPOFOL;  Surgeon: Toney Reil, MD;  Location: [facility];  Service: Gastroenterology;  Laterality: N/A;  . DILATION AND CURETTAGE OF UTERUS    . ESOPHAGOGASTRODUODENOSCOPY (EGD) WITH PROPOFOL N/A 03/18/2020   Procedure: ESOPHAGOGASTRODUODENOSCOPY (EGD) WITH PROPOFOL;  Surgeon: Toney Reil, MD;  Location: [facility];  Service: Gastroenterology;  Laterality: N/A;  . GASTRIC BYPASS  2005  . INDUCED ABORTION N/A 1997   patient was about 22 weeks  . POLYPECTOMY  12/18/2019   Procedure: POLYPECTOMY;  Surgeon: Toney Reil, MD;  Location: [facility];  Service: Gastroenterology;;  . SHOULDER ARTHROSCOPY WITH OPEN ROTATOR CUFF REPAIR Left 06/14/2016   Procedure: SHOULDER ARTHROSCOPY WITH OPEN ROTATOR CUFF REPAIR, DECOMPRESSION, EXCISION LOOSE BODY, DEBRIDEMENT;  Surgeon: Christena Flake, MD;  Location: [facility];  Service: Orthopedics;  Laterality: Left;    Family History  Problem Relation Age of Onset  . Hypertension Mother   . Cancer Father        prostate  . Heart disease Brother   . Breast cancer Cousin 40       mat  cousin    Social History   Tobacco Use  . Smoking status: Never Smoker  . Smokeless tobacco: Never Used  Substance Use Topics  . Alcohol use: No    Alcohol/week: 0.0 standard drinks     Current Outpatient Medications:  .  amitriptyline (ELAVIL) 25 MG tablet, Take 1 tablet (25 mg total) by mouth at bedtime., Disp: 30 tablet, Rfl: 0 .  Cyanocobalamin (B-12) 1000 MCG SUBL, Place 1 tablet under the tongue  daily., Disp: 30 each, Rfl: 5 .  fluticasone (FLONASE) 50 MCG/ACT nasal spray, Place 2 sprays into both nostrils daily., Disp: 16 g, Rfl: 0 .  hydrochlorothiazide (HYDRODIURIL) 25 MG tablet, Take 25 mg by mouth daily., Disp: , Rfl:  .  Insulin Pen Needle (NOVOFINE) 32G X 6 MM MISC, 1 each by Does not apply route daily., Disp: 100 each, Rfl: 2 .  Loratadine 10 MG CAPS, Take 1 capsule by mouth as needed., Disp: , Rfl:  .  nystatin cream (MYCOSTATIN), Apply 1 application topically 2 (two) times daily., Disp: 30 g, Rfl: 5 .  omeprazole (PRILOSEC) 40 MG capsule, TAKE 1 CAPSULE(40 MG) BY two times a day before meals, Disp: 180 capsule, Rfl: 1 .  rosuvastatin (CRESTOR) 10 MG tablet, Take 1 tablet (10 mg total) by mouth daily., Disp: 90 tablet, Rfl: 1 .  SAXENDA 18 MG/3ML SOPN, , Disp: , Rfl:  .  triamterene-hydrochlorothiazide (MAXZIDE-25) 37.5-25 MG tablet, Take 1 tablet by mouth daily. In place of HCTZ 25 , BP medication, Disp: 90 tablet, Rfl: 0 .  VENTOLIN HFA 108 (90 Base) MCG/ACT inhaler, INHALE 2 PUFFS INTO THE LUNGS EVERY 6 HOURS AS NEEDED FOR WHEEZING OR SHORTNESS OF BREATH, Disp: 18 g, Rfl: 0 .  Vitamin D, Ergocalciferol, (DRISDOL) 1.25 MG (50000 UNIT) CAPS capsule, Take 1 capsule (50,000 Units total) by mouth every 7 (seven) days., Disp: 12 capsule, Rfl: 1  Allergies  Allergen Reactions  . Lisinopril Swelling    Facial Swelling  . Ace Inhibitors     angioedema  . Codeine Swelling  . Valsartan Swelling    I personally reviewed active problem list, medication list, allergies, family history, social history, health maintenance with the patient/caregiver today.   ROS  Constitutional: Negative for fever or weight change.  Respiratory: Negative for cough and shortness of breath.   Cardiovascular: Negative for chest pain or palpitations.  Gastrointestinal: Negative for abdominal pain, no bowel changes.  Musculoskeletal: Negative for gait problem or joint swelling.  Skin: Negative for  rash.  Neurological: Negative for dizziness or headache.  No other specific complaints in a complete review of systems (except as listed in HPI above).  Objective  Vitals:   09/12/20 1117  BP: 134/90  Pulse: 73  Resp: 16  Temp: 97.8 F (36.6 C)  TempSrc: Oral  SpO2: 99%  Weight: 262 lb (118.8 kg)  Height: 5\' 7"  (1.702 m)    Body mass index is 41.04 kg/m.  Physical Exam  Constitutional: Patient appears well-developed and well-nourished. Obese No distress.  HEENT: head atraumatic, normocephalic, pupils equal and reactive to light,  neck supple Cardiovascular: Normal rate, regular rhythm and normal heart sounds.  No murmur heard. No BLE edema. Pulmonary/Chest: Effort normal and breath sounds normal. No respiratory distress. Abdominal: Soft.  There is no tenderness. Psychiatric: Patient has a normal mood and affect. behavior is normal. Judgment and thought content normal.  Recent Results (from the past 2160 hour(s))  Ferritin     Status: None   Collection  Time: 09/07/20  2:09 PM  Result Value Ref Range   Ferritin 35 11 - 307 ng/mL    Comment: Performed at [facility], [address] [place], [place] [address]  CBC with Differential/Platelet     Status: None   Collection Time: 09/07/20  2:09 PM  Result Value Ref Range   WBC 6.8 4.0 - 10.5 K/uL   RBC 4.34 3.87 - 5.11 MIL/uL   Hemoglobin 13.1 12.0 - 15.0 g/dL   HCT 32.4 36 - 46 %   MCV 88.7 80.0 - 100.0 fL   MCH 30.2 26.0 - 34.0 pg   MCHC 34.0 30.0 - 36.0 g/dL   RDW 40.1 02.7 - 25.3 %   Platelets 260 150 - 400 K/uL   nRBC 0.0 0.0 - 0.2 %   Neutrophils Relative % 57 %   Neutro Abs 3.9 1.7 - 7.7 K/uL   Lymphocytes Relative 33 %   Lymphs Abs 2.3 0.7 - 4.0 K/uL   Monocytes Relative 7 %   Monocytes Absolute 0.5 0.1 - 1.0 K/uL   Eosinophils Relative 2 %   Eosinophils Absolute 0.1 0.0 - 0.5 K/uL   Basophils Relative 1 %   Basophils Absolute 0.0 0.0 - 0.1 K/uL   Immature Granulocytes 0 %   Abs Immature  Granulocytes 0.01 0.00 - 0.07 K/uL    Comment: Performed at [facility], [address] [place], [place] [address]     PHQ2/9: Depression screen [facility] 2/9 09/12/2020 09/12/2020 07/20/2020 02/26/2020 12/04/2019  Decreased Interest 1 1 1  0 0  Down, Depressed, Hopeless 1 1 1  0 0  PHQ - 2 Score 2 2 2  0 0  Altered sleeping 1 - 3 0 0  Tired, decreased energy 1 - 2 0 0  Change in appetite 1 - 2 0 0  Feeling bad or failure about yourself  1 - 1 0 0  Trouble concentrating 1 - 1 0 0  Moving slowly or fidgety/restless 0 - 0 0 0  Suicidal thoughts 0 - 0 0 0  PHQ-9 Score 7 - 11 0 0  Difficult doing work/chores Not difficult at all - Not difficult at all - -  Some recent data might be hidden    phq 9 is positive   Fall Risk: Fall Risk  09/12/2020 07/20/2020 02/26/2020 12/04/2019 06/26/2019  Falls in the past year? 0 0 0 0 0  Number falls in past yr: 0 0 0 0 0  Injury with Fall? 0 0 0 0 1  Follow up - - - - -     Functional Status Survey: Is the patient deaf or have difficulty hearing?: No Does the patient have difficulty seeing, even when wearing glasses/contacts?: Yes Does the patient have difficulty concentrating, remembering, or making decisions?: Yes Does the patient have difficulty walking or climbing stairs?: Yes Does the patient have difficulty dressing or bathing?: No Does the patient have difficulty doing errands alone such as visiting a doctor's office or shopping?: No    Assessment & Plan  1. Benign essential HTN  - triamterene-hydrochlorothiazide (MAXZIDE-25) 37.5-25 MG tablet; Take 1 tablet by mouth daily. In place of HCTZ 25 , BP medication  Dispense: 90 tablet; Refill: 0 - amLODipine (NORVASC) 2.5 MG tablet; Take 1 tablet (2.5 mg total) by mouth daily.  Dispense: 90 tablet; Refill: 0  2. Insomnia due to other mental disorder  - QUEtiapine (SEROQUEL) 25 MG tablet; Take 1 tablet (25 mg total) by mouth at bedtime. In  place of trazodone for sleep  Dispense: 30  tablet; Refill: 0  3. GERD without esophagitis  On pantoprazole, explained stress can cause gastritis and worsening of symptoms.

## 2020-09-12 ENCOUNTER — Other Ambulatory Visit: Payer: Self-pay

## 2020-09-12 ENCOUNTER — Ambulatory Visit (INDEPENDENT_AMBULATORY_CARE_PROVIDER_SITE_OTHER): Payer: BC Managed Care – PPO | Admitting: Family Medicine

## 2020-09-12 ENCOUNTER — Encounter: Payer: Self-pay | Admitting: Family Medicine

## 2020-09-12 VITALS — BP 134/90 | HR 73 | Temp 97.8°F | Resp 16 | Ht 67.0 in | Wt 262.0 lb

## 2020-09-12 DIAGNOSIS — I10 Essential (primary) hypertension: Secondary | ICD-10-CM | POA: Diagnosis not present

## 2020-09-12 DIAGNOSIS — F99 Mental disorder, not otherwise specified: Secondary | ICD-10-CM | POA: Diagnosis not present

## 2020-09-12 DIAGNOSIS — K219 Gastro-esophageal reflux disease without esophagitis: Secondary | ICD-10-CM | POA: Diagnosis not present

## 2020-09-12 DIAGNOSIS — F5105 Insomnia due to other mental disorder: Secondary | ICD-10-CM | POA: Diagnosis not present

## 2020-09-12 MED ORDER — QUETIAPINE FUMARATE 25 MG PO TABS: 25.0000 mg | ORAL_TABLET | Freq: Every day | ORAL | 0 refills | Status: DC

## 2020-09-12 MED ORDER — AMLODIPINE BESYLATE 2.5 MG PO TABS: 2.5000 mg | ORAL_TABLET | Freq: Every day | ORAL | 0 refills | Status: DC

## 2020-09-12 MED ORDER — TRIAMTERENE-HCTZ 37.5-25 MG PO TABS: 1.0000 | ORAL_TABLET | Freq: Every day | ORAL | 0 refills | Status: DC

## 2020-09-12 NOTE — Patient Instructions (Signed)

## 2020-10-05 ENCOUNTER — Inpatient Hospital Stay: Payer: BC Managed Care – PPO

## 2020-10-06 ENCOUNTER — Other Ambulatory Visit: Payer: Self-pay

## 2020-10-06 ENCOUNTER — Inpatient Hospital Stay: Payer: BC Managed Care – PPO | Attending: Hematology and Oncology

## 2020-10-06 DIAGNOSIS — E538 Deficiency of other specified B group vitamins: Secondary | ICD-10-CM | POA: Insufficient documentation

## 2020-10-06 DIAGNOSIS — K909 Intestinal malabsorption, unspecified: Secondary | ICD-10-CM

## 2020-10-06 MED ORDER — CYANOCOBALAMIN 1000 MCG/ML IJ SOLN
INTRAMUSCULAR | Status: AC
  Filled 2020-10-06: qty 1

## 2020-10-06 MED ORDER — CYANOCOBALAMIN 1000 MCG/ML IJ SOLN
1000.0000 ug | Freq: Once | INTRAMUSCULAR | Status: AC
  Administered 2020-10-06: 1000 ug via INTRAMUSCULAR

## 2020-10-14 ENCOUNTER — Other Ambulatory Visit: Payer: Self-pay | Admitting: Family Medicine

## 2020-10-14 DIAGNOSIS — I10 Essential (primary) hypertension: Secondary | ICD-10-CM

## 2020-10-19 NOTE — Progress Notes (Signed)
Name: Savannah Manning   MRN: [id number]    DOB: [birth year]   Date:10/20/2020       Progress Note  Subjective  Chief Complaint  Follow up   HPI    Dysthymia/insomnia: she has a new position at work, feels overwhelmed, she is upset about her weight. She is sleeping better on Seroquel but feels tired in the mornings. She will try taking seroquel earlier in the evening. Trazodone did not work for sleep. Phq 9 is going up and she is willing to try duloxetine  GERD: taking Omeprazole and under the care of Dr. Allegra Lai now   Obesity/prediabetes:. She has been obese since childhood. She has tried multiple diets in the past, she hadgastric bypass in 2005. Weight before surgery was 314 lbs, she went down to 164 lbs, but has been gradually gaining weight. She states her weight has gone up since she started to work from [employer].[facility],and was denied for a revision in the Summer 2018. She has failed Metformin,started her on Ozempic06/2018 herweight was259 lbs, her weightwent as low as239 lbshowever is trending up again.[name] Her insurance has denied Ozempic, only paying for patients that have DM. She states is not covering Saxenda again, discussed Contrave and she would like to try , may need to pay cash    Chronic knee / left hip pain/ left rotator cuff tendinitis: she has seen Dr. Joice Lofts in the past , last visit was for knee pain, she used to take Meloxicam but off medication because of history of bariatric surgery. Pain on left hip is getting worse - affecting her sleep. She does not want steroid injections, taking Tylenol we discussed adding Duloxetine and or Lyrica   History of iron deficiency anemia and B12 deficiency: she is under the care of Dr. Merlene Pulling and is getting monthly B12. Last iron storage was normal , keep follow up with her.   HTN:  She denies chest pain or palpitation. We changed from HCTZ 25 to Maxzide 37.5/25 , bp is at goal today, no side  effects of medications   Vitamin D deficiency: continue rx vitamin D.   Dumping syndrome: stable    Patient Active Problem List   Diagnosis Date Noted  . Primary osteoarthritis of right knee 04/06/2020  . Primary osteoarthritis of left knee 04/06/2020  . Pre-diabetes 12/05/2018  . Malabsorption of iron 11/07/2018  . Chronic pain of right knee 07/26/2017  . Vitamin D deficiency 04/24/2017  . B12 deficiency 04/24/2017  . Abnormal mammogram of right breast 01/01/2017  . Asthma, mild persistent 12/17/2016  . History of shoulder surgery 10/12/2016  . Primary osteoarthritis of left shoulder 06/14/2016  . Incomplete tear of left rotator cuff 05/30/2016  . Rotator cuff tendinitis 05/30/2016  . History of bariatric surgery 05/04/2016  . Allergic rhinitis 05/12/2015  . Benign essential HTN 05/12/2015  . Anxiety and depression 05/12/2015  . Insomnia, persistent 05/12/2015  . Dyslipidemia 05/12/2015  . Edema extremities 05/12/2015  . Gastroesophageal reflux disease without esophagitis 05/12/2015  . Hypoglycemia 05/12/2015  . Eczema intertrigo 05/12/2015  . Migraine 05/12/2015  . Plantar fasciitis 05/12/2015  . Umbilical hernia without obstruction and without gangrene 05/12/2015  . Morbid obesity, unspecified obesity type (HCC) 02/13/2008    Past Surgical History:  Procedure Laterality Date  . ABDOMINAL HYSTERECTOMY  2012  . CHOLECYSTECTOMY    . COLONOSCOPY WITH PROPOFOL N/A 12/18/2019   Procedure: COLONOSCOPY WITH PROPOFOL;  Surgeon: Toney Reil, MD;  Location: [facility];  Service: Gastroenterology;  Laterality: N/A;  . DILATION AND CURETTAGE OF UTERUS    . ESOPHAGOGASTRODUODENOSCOPY (EGD) WITH PROPOFOL N/A 03/18/2020   Procedure: ESOPHAGOGASTRODUODENOSCOPY (EGD) WITH PROPOFOL;  Surgeon: Toney ReilVanga, Rohini Reddy, MD;  [facility];  Service: Gastroenterology;  Laterality: N/A;  . GASTRIC BYPASS  2005  . INDUCED ABORTION N/A 1997   patient was about 22  weeks  . POLYPECTOMY  12/18/2019   Procedure: POLYPECTOMY;  Surgeon: Toney ReilVanga, Rohini Reddy, MD;  [facility];  Service: Gastroenterology;;  . SHOULDER ARTHROSCOPY WITH OPEN ROTATOR CUFF REPAIR Left 06/14/2016   Procedure: SHOULDER ARTHROSCOPY WITH OPEN ROTATOR CUFF REPAIR, DECOMPRESSION, EXCISION LOOSE BODY, DEBRIDEMENT;  Surgeon: Christena FlakeJohn J Poggi, MD;  Location: [facility] OR[facility];  Service: Orthopedics;  Laterality: Left;    Family History  Problem Relation Age of Onset  . Hypertension Mother   . Cancer Father        prostate  . Heart disease Brother   . Breast cancer Cousin 40       mat cousin    Social History   Tobacco Use  . Smoking status: Never Smoker  . Smokeless tobacco: Never Used  Substance Use Topics  . Alcohol use: No    Alcohol/week: 0.0 standard drinks     Current Outpatient Medications:  .  amitriptyline (ELAVIL) 25 MG tablet, Take 1 tablet (25 mg total) by mouth at bedtime., Disp: 30 tablet, Rfl: 0 .  amLODipine (NORVASC) 2.5 MG tablet, Take 1 tablet (2.5 mg total) by mouth daily., Disp: 90 tablet, Rfl: 0 .  Cyanocobalamin (B-12) 1000 MCG SUBL, Place 1 tablet under the tongue daily., Disp: 30 each, Rfl: 5 .  fluticasone (FLONASE) 50 MCG/ACT nasal spray, Place 2 sprays into both nostrils daily., Disp: 16 g, Rfl: 0 .  Insulin Pen Needle (NOVOFINE) 32G X 6 MM MISC, 1 each by Does not apply route daily., Disp: 100 each, Rfl: 2 .  Loratadine 10 MG CAPS, Take 1 capsule by mouth as needed., Disp: , Rfl:  .  nystatin cream (MYCOSTATIN), Apply 1 application topically 2 (two) times daily., Disp: 30 g, Rfl: 5 .  omeprazole (PRILOSEC) 40 MG capsule, TAKE 1 CAPSULE(40 MG) BY two times a day before meals, Disp: 180 capsule, Rfl: 1 .  QUEtiapine (SEROQUEL) 25 MG tablet, Take 1 tablet (25 mg total) by mouth at bedtime. In place of trazodone for sleep, Disp: 30 tablet, Rfl: 0 .  rosuvastatin (CRESTOR) 10 MG tablet, Take 1 tablet (10 mg total) by mouth daily., Disp: 90 tablet,  Rfl: 1 .  triamterene-hydrochlorothiazide (MAXZIDE-25) 37.5-25 MG tablet, Take 1 tablet by mouth daily. In place of HCTZ 25 , BP medication, Disp: 90 tablet, Rfl: 0 .  VENTOLIN HFA 108 (90 Base) MCG/ACT inhaler, INHALE 2 PUFFS INTO THE LUNGS EVERY 6 HOURS AS NEEDED FOR WHEEZING OR SHORTNESS OF BREATH, Disp: 18 g, Rfl: 0 .  Vitamin D, Ergocalciferol, (DRISDOL) 1.25 MG (50000 UNIT) CAPS capsule, Take 1 capsule (50,000 Units total) by mouth every 7 (seven) days., Disp: 12 capsule, Rfl: 1  Allergies  Allergen Reactions  . Lisinopril Swelling    Facial Swelling  . Ace Inhibitors     angioedema  . Codeine Swelling  . Valsartan Swelling    I personally reviewed active problem list, medication list, allergies, family history, social history, health maintenance with the patient/caregiver today.   ROS  Constitutional: Negative for fever or weight change.  Respiratory: Negative for cough and shortness of breath.  Cardiovascular: Negative for chest pain or palpitations.  Gastrointestinal: Negative for abdominal pain, no bowel changes.  Musculoskeletal: Negative for gait problem or joint swelling.  Skin: Negative for rash.  Neurological: Negative for dizziness or headache.  No other specific complaints in a complete review of systems (except as listed in HPI above).  Objective  Vitals:   10/20/20 1108  BP: 136/82  Pulse: 79  Resp: 16  Temp: 97.8 F (36.6 C)  TempSrc: Oral  SpO2: 98%  Weight: 264 lb 4.8 oz (119.9 kg)  Height: 5\' 7"  (1.702 m)    Body mass index is 41.4 kg/m.  Physical Exam  Constitutional: Patient appears well-developed and well-nourished. Obese  No distress.  HEENT: head atraumatic, normocephalic, pupils equal and reactive to light,  neck supple Cardiovascular: Normal rate, regular rhythm and normal heart sounds.  No murmur heard. No BLE edema. Pulmonary/Chest: Effort normal and breath sounds normal. No respiratory distress. Abdominal: Soft.  There is no  tenderness. Psychiatric: Patient has a normal mood and affect. behavior is normal. Judgment and thought content normal.  Recent Results (from the past 2160 hour(s))  Ferritin     Status: None   Collection Time: 09/07/20  2:09 PM  Result Value Ref Range   Ferritin 35 11 - 307 ng/mL    Comment: Performed at [facility], [address] [place], [place]  CBC with Differential/Platelet     Status: None   Collection Time: 09/07/20  2:09 PM  Result Value Ref Range   WBC 6.8 4.0 - 10.5 K/uL   RBC 4.34 3.87 - 5.11 MIL/uL   Hemoglobin 13.1 12.0 - 15.0 g/dL   HCT 13/03/21 39.7 - 67.3 %   MCV 88.7 80.0 - 100.0 fL   MCH 30.2 26.0 - 34.0 pg   MCHC 34.0 30.0 - 36.0 g/dL   RDW 41.9 37.9 - 02.4 %   Platelets 260 150 - 400 K/uL   nRBC 0.0 0.0 - 0.2 %   Neutrophils Relative % 57 %   Neutro Abs 3.9 1.7 - 7.7 K/uL   Lymphocytes Relative 33 %   Lymphs Abs 2.3 0.7 - 4.0 K/uL   Monocytes Relative 7 %   Monocytes Absolute 0.5 0.1 - 1.0 K/uL   Eosinophils Relative 2 %   Eosinophils Absolute 0.1 0.0 - 0.5 K/uL   Basophils Relative 1 %   Basophils Absolute 0.0 0.0 - 0.1 K/uL   Immature Granulocytes 0 %   Abs Immature Granulocytes 0.01 0.00 - 0.07 K/uL    Comment: Performed at [facility], [address] [place], [place]      PHQ2/9: Depression screen [facility] 2/9 10/20/2020 09/12/2020 09/12/2020 07/20/2020 02/26/2020  Decreased Interest 1 1 1 1  0  Down, Depressed, Hopeless 1 1 1 1  0  PHQ - 2 Score 2 2 2 2  0  Altered sleeping 3 1 - 3 0  Tired, decreased energy 3 1 - 2 0  Change in appetite 2 1 - 2 0  Feeling bad or failure about yourself  3 1 - 1 0  Trouble concentrating 3 1 - 1 0  Moving slowly or fidgety/restless 0 0 - 0 0  Suicidal thoughts 0 0 - 0 0  PHQ-9 Score 16 7 - 11 0  Difficult doing work/chores - Not difficult at all - Not difficult at all -  Some recent data might be hidden    phq 9 is positive   Fall Risk: Fall Risk  10/20/2020 09/12/2020  07/20/2020 02/26/2020 12/04/2019  Falls in the past year? 0 0 0 0 0  Number falls in past yr: 0 0 0 0 0  Injury with Fall? 0 0 0 0 0  Follow up - - - - -     Functional Status Survey: Is the patient deaf or have difficulty hearing?: No Does the patient have difficulty seeing, even when wearing glasses/contacts?: No Does the patient have difficulty concentrating, remembering, or making decisions?: Yes Does the patient have difficulty walking or climbing stairs?: Yes Does the patient have difficulty dressing or bathing?: No Does the patient have difficulty doing errands alone such as visiting a doctor's office or shopping?: No   Assessment & Plan  1. Benign essential HTN  - COMPLETE METABOLIC PANEL WITH GFR - triamterene-hydrochlorothiazide (MAXZIDE-25) 37.5-25 MG tablet; Take 1 tablet by mouth daily. In place of HCTZ 25 , BP medication  Dispense: 90 tablet; Refill: 1 - amLODipine (NORVASC) 2.5 MG tablet; Take 1 tablet (2.5 mg total) by mouth daily.  Dispense: 90 tablet; Refill: 1  2. GERD without esophagitis   3. Insomnia due to other mental disorder  - QUEtiapine (SEROQUEL) 25 MG tablet; Take 1 tablet (25 mg total) by mouth at bedtime. In place of trazodone for sleep  Dispense: 90 tablet; Refill: 0  4. Dyslipidemia  - Lipid panel - rosuvastatin (CRESTOR) 10 MG tablet; Take 1 tablet (10 mg total) by mouth daily.  Dispense: 90 tablet; Refill: 1  5. B12 deficiency  - B12 and Folate Panel  6. History of bariatric surgery   7. Pre-diabetes  - Hemoglobin A1c  8. Mild intermittent asthma without complication   9. History of iron deficiency   10. Morbid obesity with BMI of 40.0-44.9, adult (HCC)  - Naltrexone-buPROPion HCl ER (CONTRAVE) 8-90 MG TB12; Start 1 tablet every morning for 7 days, then 1 tablet twice daily for 7 days, then 2 tablets every morning and one every evening  Dispense: 120 tablet; Refill: 0  11. Vitamin D deficiency  - VITAMIN D 25 Hydroxy (Vit-D  Deficiency, Fractures) - Vitamin D, Ergocalciferol, (DRISDOL) 1.25 MG (50000 UNIT) CAPS capsule; Take 1 capsule (50,000 Units total) by mouth every 7 (seven) days.  Dispense: 12 capsule; Refill: 1  12. Primary osteoarthritis of both knees  - DULoxetine (CYMBALTA) 30 MG capsule; Take 1-2 capsules (30-60 mg total) by mouth daily. After first week go up to two daily  Dispense: 60 capsule; Refill: 0  13. Dysthymia  - DULoxetine (CYMBALTA) 30 MG capsule; Take 1-2 capsules (30-60 mg total) by mouth daily. After first week go up to two daily  Dispense: 60 capsule; Refill: 0  14. Greater trochanteric bursitis of left hip  - DULoxetine (CYMBALTA) 30 MG capsule; Take 1-2 capsules (30-60 mg total) by mouth daily. After first week go up to two daily  Dispense: 60 capsule; Refill: 0

## 2020-10-20 ENCOUNTER — Other Ambulatory Visit: Payer: Self-pay

## 2020-10-20 ENCOUNTER — Ambulatory Visit (INDEPENDENT_AMBULATORY_CARE_PROVIDER_SITE_OTHER): Payer: BC Managed Care – PPO | Admitting: Gastroenterology

## 2020-10-20 ENCOUNTER — Ambulatory Visit (INDEPENDENT_AMBULATORY_CARE_PROVIDER_SITE_OTHER): Payer: BC Managed Care – PPO | Admitting: Family Medicine

## 2020-10-20 ENCOUNTER — Encounter: Payer: Self-pay | Admitting: Gastroenterology

## 2020-10-20 ENCOUNTER — Encounter: Payer: Self-pay | Admitting: Family Medicine

## 2020-10-20 VITALS — BP 136/82 | HR 79 | Temp 97.8°F | Resp 16 | Ht 67.0 in | Wt 264.3 lb

## 2020-10-20 VITALS — BP 125/83 | HR 77 | Temp 98.3°F | Wt 265.4 lb

## 2020-10-20 DIAGNOSIS — Z9884 Bariatric surgery status: Secondary | ICD-10-CM

## 2020-10-20 DIAGNOSIS — R7303 Prediabetes: Secondary | ICD-10-CM | POA: Diagnosis not present

## 2020-10-20 DIAGNOSIS — E785 Hyperlipidemia, unspecified: Secondary | ICD-10-CM

## 2020-10-20 DIAGNOSIS — E538 Deficiency of other specified B group vitamins: Secondary | ICD-10-CM

## 2020-10-20 DIAGNOSIS — K219 Gastro-esophageal reflux disease without esophagitis: Secondary | ICD-10-CM | POA: Diagnosis not present

## 2020-10-20 DIAGNOSIS — E559 Vitamin D deficiency, unspecified: Secondary | ICD-10-CM | POA: Diagnosis not present

## 2020-10-20 DIAGNOSIS — F5105 Insomnia due to other mental disorder: Secondary | ICD-10-CM | POA: Diagnosis not present

## 2020-10-20 DIAGNOSIS — Z6841 Body Mass Index (BMI) 40.0 and over, adult: Secondary | ICD-10-CM

## 2020-10-20 DIAGNOSIS — Z8639 Personal history of other endocrine, nutritional and metabolic disease: Secondary | ICD-10-CM

## 2020-10-20 DIAGNOSIS — F99 Mental disorder, not otherwise specified: Secondary | ICD-10-CM

## 2020-10-20 DIAGNOSIS — I10 Essential (primary) hypertension: Secondary | ICD-10-CM | POA: Diagnosis not present

## 2020-10-20 DIAGNOSIS — M170 Bilateral primary osteoarthritis of knee: Secondary | ICD-10-CM | POA: Insufficient documentation

## 2020-10-20 DIAGNOSIS — J4520 Mild intermittent asthma, uncomplicated: Secondary | ICD-10-CM | POA: Insufficient documentation

## 2020-10-20 DIAGNOSIS — M7062 Trochanteric bursitis, left hip: Secondary | ICD-10-CM

## 2020-10-20 DIAGNOSIS — F341 Dysthymic disorder: Secondary | ICD-10-CM | POA: Insufficient documentation

## 2020-10-20 MED ORDER — QUETIAPINE FUMARATE 25 MG PO TABS
25.0000 mg | ORAL_TABLET | Freq: Every day | ORAL | 0 refills | Status: DC
Start: 2020-10-20 — End: 2021-01-18

## 2020-10-20 MED ORDER — TRIAMTERENE-HCTZ 37.5-25 MG PO TABS: 1.0000 | ORAL_TABLET | Freq: Every day | ORAL | 1 refills | Status: DC

## 2020-10-20 MED ORDER — CONTRAVE 8-90 MG PO TB12: ORAL_TABLET | ORAL | 0 refills | Status: DC

## 2020-10-20 MED ORDER — VITAMIN D (ERGOCALCIFEROL) 1.25 MG (50000 UNIT) PO CAPS: 50000.0000 [IU] | ORAL_CAPSULE | ORAL | 1 refills | Status: DC

## 2020-10-20 MED ORDER — DULOXETINE HCL 30 MG PO CPEP: 30.0000 mg | ORAL_CAPSULE | Freq: Every day | ORAL | 0 refills | Status: DC

## 2020-10-20 MED ORDER — AMLODIPINE BESYLATE 2.5 MG PO TABS: 2.5000 mg | ORAL_TABLET | Freq: Every day | ORAL | 1 refills | Status: DC

## 2020-10-20 MED ORDER — ROSUVASTATIN CALCIUM 10 MG PO TABS: 10.0000 mg | ORAL_TABLET | Freq: Every day | ORAL | 1 refills | Status: DC

## 2020-10-20 NOTE — Progress Notes (Signed)
Arlyss Repress, MD [address]  [address]  [place], [place] [address]  Main: [phone]  Fax: [phone]    Gastroenterology Consultation  Referring Provider:     Alba Cory, MD Primary Care Physician:  Alba Cory, MD Primary Gastroenterologist:  Dr. Arlyss Repress Reason for Consultation:     Chronic GERD        HPI:   Savannah Manning is a 59 y.o. female referred by Dr. Alba Cory, MD  for consultation & management of chronic GERD.  Patient reports that she has been experiencing symptoms including epigastric burning pain, burning chest pain, regurgitation, sour taste in mouth, particularly at bedtime, laying down activated with fatty foods.  She was taking omeprazole as needed for several years.  She saw Dr. Carlynn Purl last week, she has been prescribed to take omeprazole 40 mg daily.  Patient reports that her symptoms are better since then.  She also reports sensation of food stuck in her lower chest.  Follow-up visit 04/20/2020 Patient reports that her nocturnal GERD symptoms have significantly improved after she started using a wedge pillow.  She is taking omeprazole 40 mg once a day before breakfast.  She reports her daytime symptoms are intermittent.  She continues to gain weight.  Her EGD was unremarkable  Follow-up visit 07/21/2020 Patient continues to gain weight.  She reports that she has been significantly stressed out with work note and has been eating late at night.  Also, she is taking care of her parents.  She is less ambulatory due to her job stress.  Her blood pressure is not well controlled either.  She is having intermittent episodes of in the morning heartburn with nausea.  She is on Prilosec 40 mg twice daily.  She is not sleeping well  She does not smoke or drink alcohol  Follow-up visit 10/20/2020 She reports that her reflux symptoms are fairly under control other than one flareup that she had since she has been on Prilosec 40 mg twice a day.   She is also started on Seroquel 25 mg at bedtime for insomnia.  She continues to take amitriptyline 25 mg at bedtime.  She reports gurgling in her stomach and would like to try some fiber.  She reports having regular bowel movements.  NSAIDs: None  Antiplts/Anticoagulants/Anti thrombotics: None  GI Procedures:  Colonoscopy 12/18/2019 - One 3 mm polyp in the transverse colon, removed with a cold snare. Complete resection. Polyp tissue not retrieved. - The distal rectum and anal verge are normal on retroflexion view. - The examination was otherwise normal.  Upper endoscopy 03/28/2017 A. Stomach, rule out Helicobacter pylori, endoscopic biopsy:  Gastric oxyntic mucosa with mild chronic gastritis. No active gastritis is seen. No Helicobacter pylori organisms are seen on routine H&E stain.  EGD 03/18/20 - Roux-en-Y gastrojejunostomy with gastrojejunal anastomosis characterized by healthy appearing mucosa. - Normal gastroesophageal junction and esophagus. - No specimens collected.  Past Medical History:  Diagnosis Date  . Anemia   . Arthritis   . Asthma   . GERD (gastroesophageal reflux disease)   . Headache   . Hypertension   . Iron deficiency anemia 12/05/2018  . Wears contact lenses     Past Surgical History:  Procedure Laterality Date  . ABDOMINAL HYSTERECTOMY  2012  . CHOLECYSTECTOMY    . COLONOSCOPY WITH PROPOFOL N/A 12/18/2019   Procedure: COLONOSCOPY WITH PROPOFOL;  Surgeon: Toney Reil, MD;  Location: [facility];  Service: Gastroenterology;  Laterality: N/A;  .  DILATION AND CURETTAGE OF UTERUS    . ESOPHAGOGASTRODUODENOSCOPY (EGD) WITH PROPOFOL N/A 03/18/2020   Procedure: ESOPHAGOGASTRODUODENOSCOPY (EGD) WITH PROPOFOL;  Surgeon: Toney Reil, MD;  Location: [facility];  Service: Gastroenterology;  Laterality: N/A;  . GASTRIC BYPASS  2005  . INDUCED ABORTION N/A 1997   patient was about 22 weeks  . POLYPECTOMY  12/18/2019   Procedure:  POLYPECTOMY;  Surgeon: Toney Reil, MD;  Location: [facility];  Service: Gastroenterology;;  . SHOULDER ARTHROSCOPY WITH OPEN ROTATOR CUFF REPAIR Left 06/14/2016   Procedure: SHOULDER ARTHROSCOPY WITH OPEN ROTATOR CUFF REPAIR, DECOMPRESSION, EXCISION LOOSE BODY, DEBRIDEMENT;  Surgeon: Christena Flake, MD;  Location: [facility];  Service: Orthopedics;  Laterality: Left;    Current Outpatient Medications:  .  amLODipine (NORVASC) 2.5 MG tablet, Take 1 tablet (2.5 mg total) by mouth daily., Disp: 90 tablet, Rfl: 1 .  Cyanocobalamin (B-12) 1000 MCG SUBL, Place 1 tablet under the tongue daily., Disp: 30 each, Rfl: 5 .  DULoxetine (CYMBALTA) 30 MG capsule, Take 1-2 capsules (30-60 mg total) by mouth daily. After first week go up to two daily, Disp: 60 capsule, Rfl: 0 .  fluticasone (FLONASE) 50 MCG/ACT nasal spray, Place 2 sprays into both nostrils daily., Disp: 16 g, Rfl: 0 .  Insulin Pen Needle (NOVOFINE) 32G X 6 MM MISC, 1 each by Does not apply route daily., Disp: 100 each, Rfl: 2 .  Loratadine 10 MG CAPS, Take 1 capsule by mouth as needed., Disp: , Rfl:  .  Naltrexone-buPROPion HCl ER (CONTRAVE) 8-90 MG TB12, Start 1 tablet every morning for 7 days, then 1 tablet twice daily for 7 days, then 2 tablets every morning and one every evening, Disp: 120 tablet, Rfl: 0 .  nystatin cream (MYCOSTATIN), Apply 1 application topically 2 (two) times daily., Disp: 30 g, Rfl: 5 .  omeprazole (PRILOSEC) 40 MG capsule, TAKE 1 CAPSULE(40 MG) BY two times a day before meals, Disp: 180 capsule, Rfl: 1 .  QUEtiapine (SEROQUEL) 25 MG tablet, Take 1 tablet (25 mg total) by mouth at bedtime. In place of trazodone for sleep, Disp: 90 tablet, Rfl: 0 .  rosuvastatin (CRESTOR) 10 MG tablet, Take 1 tablet (10 mg total) by mouth daily., Disp: 90 tablet, Rfl: 1 .  triamterene-hydrochlorothiazide (MAXZIDE-25) 37.5-25 MG tablet, Take 1 tablet by mouth daily. In place of HCTZ 25 , BP medication, Disp: 90 tablet, Rfl: 1 .   VENTOLIN HFA 108 (90 Base) MCG/ACT inhaler, INHALE 2 PUFFS INTO THE LUNGS EVERY 6 HOURS AS NEEDED FOR WHEEZING OR SHORTNESS OF BREATH, Disp: 18 g, Rfl: 0 .  Vitamin D, Ergocalciferol, (DRISDOL) 1.25 MG (50000 UNIT) CAPS capsule, Take 1 capsule (50,000 Units total) by mouth every 7 (seven) days., Disp: 12 capsule, Rfl: 1   Family History  Problem Relation Age of Onset  . Hypertension Mother   . Cancer Father        prostate  . Heart disease Brother   . Breast cancer Cousin 40       mat cousin     Social History   Tobacco Use  . Smoking status: Never Smoker  . Smokeless tobacco: Never Used  Vaping Use  . Vaping Use: Never used  Substance Use Topics  . Alcohol use: No    Alcohol/week: 0.0 standard drinks  . Drug use: No    Allergies as of 10/20/2020 - Review Complete 10/20/2020  Allergen Reaction Noted  . Lisinopril Swelling 05/13/2015  . Ace inhibitors  04/19/2015  . Codeine Swelling 04/19/2015  . Valsartan Swelling 05/13/2015    Review of Systems:    All systems reviewed and negative except where noted in HPI.   Physical Exam:  BP 125/83 (BP Location: Left Arm, Patient Position: Sitting, Cuff Size: Large)   Pulse 77   Temp 98.3 F (36.8 C) (Oral)   Wt 265 lb 6 oz (120.4 kg)   BMI 41.56 kg/m  No LMP recorded. Patient has had a hysterectomy.  General:   Alert,  Well-developed, well-nourished, pleasant and cooperative in NAD Head:  Normocephalic and atraumatic. Eyes:  Sclera clear, no icterus.   Conjunctiva pink. Ears:  Normal auditory acuity. Nose:  No deformity, discharge, or lesions. Mouth:  No deformity or lesions,oropharynx pink & moist. Neck:  Supple; no masses or thyromegaly. Lungs:  Respirations even and unlabored.  Clear throughout to auscultation.   No wheezes, crackles, or rhonchi. No acute distress. Heart:  Regular rate and rhythm; no murmurs, clicks, rubs, or gallops. Abdomen:  Normal bowel sounds. Soft, obese non-tender and non-distended without  masses, hepatosplenomegaly or hernias noted.  No guarding or rebound tenderness.   Rectal: Not performed Msk:  Symmetrical without gross deformities. Good, equal movement & strength bilaterally. Pulses:  Normal pulses noted. Extremities:  No clubbing or edema.  No cyanosis. Neurologic:  Alert and oriented x3;  grossly normal neurologically. Skin:  Intact without significant lesions or rashes. No jaundice. Psych:  Alert and cooperative. Normal mood and affect.  Imaging Studies: No abdominal imaging  Assessment and Plan:   MARIFER HURD is a 59 y.o. female with morbid obesity, BMI 39.8, history of Roux-en-Y gastric bypass, metabolic syndrome is seen for follow-up of chronic GERD  Chronic GERD: fairly controlled EGD normal Reiterated on antireflux lifestyle, discussed about healthy eating habits Continue prilosec 40 mg to 2 times a day. Suggested to decrease to once daily in 1 month  Stress, weight gain Continue amitriptyline 25 mg at bedtime She is started on Seroquel 25mg  for anxiety and depression   Follow up as needed   , MD

## 2020-10-21 LAB — COMPLETE METABOLIC PANEL WITH GFR
AG Ratio: 1.8 (calc) (ref 1.0–2.5)
ALT: 9 U/L (ref 6–29)
AST: 12 U/L (ref 10–35)
Albumin: 3.9 g/dL (ref 3.6–5.1)
Alkaline phosphatase (APISO): 92 U/L (ref 37–153)
BUN: 14 mg/dL (ref 7–25)
CO2: 31 mmol/L (ref 20–32)
Calcium: 9.2 mg/dL (ref 8.6–10.4)
Chloride: 104 mmol/L (ref 98–110)
Creat: 0.73 mg/dL (ref 0.50–1.05)
GFR, Est African American: 106 mL/min/{1.73_m2} (ref >=60)
GFR, Est Non African American: 91 mL/min/{1.73_m2} (ref >=60)
Globulin: 2.2 g/dL (calc) (ref 1.9–3.7)
Glucose, Bld: 87 mg/dL (ref 65–99)
Potassium: 4.2 mmol/L (ref 3.5–5.3)
Sodium: 140 mmol/L (ref 135–146)
Total Bilirubin: 0.4 mg/dL (ref 0.2–1.2)
Total Protein: 6.1 g/dL (ref 6.1–8.1)

## 2020-10-21 LAB — B12 AND FOLATE PANEL
Folate: 9.6 ng/mL
Vitamin B-12: 560 pg/mL (ref 200–1100)

## 2020-10-21 LAB — LIPID PANEL
Cholesterol: 161 mg/dL (ref <200)
HDL: 62 mg/dL (ref >=50)
LDL Cholesterol (Calc): 78 mg/dL (calc)
Non-HDL Cholesterol (Calc): 99 mg/dL (calc) (ref <130)
Total CHOL/HDL Ratio: 2.6 (calc) (ref <5.0)
Triglycerides: 131 mg/dL (ref <150)

## 2020-10-21 LAB — VITAMIN D 25 HYDROXY (VIT D DEFICIENCY, FRACTURES): Vit D, 25-Hydroxy: 41 ng/mL (ref 30–100)

## 2020-10-21 LAB — HEMOGLOBIN A1C
Hgb A1c MFr Bld: 5.9 % of total Hgb — ABNORMAL HIGH (ref <5.7)
Mean Plasma Glucose: 123 mg/dL
eAG (mmol/L): 6.8 mmol/L

## 2020-10-23 ENCOUNTER — Encounter: Payer: Self-pay | Admitting: Family Medicine

## 2020-10-24 NOTE — Progress Notes (Signed)
Name: Savannah Manning   MRN: [id number]    DOB: 08/30/1961   Date:10/25/2020       Progress Note  Subjective  Chief Complaint  Consult/Referral  HPI  Urinary Frequency: she noticed symptoms suddenly 5 days ago. Urgency also dysuria at the end of micturition. She has nocturia, multiple times a night. She states today is a little better than it was over the weekend. She had a catching sensation on left flank area but that has resolved. No hematuria, fever, chills , nausea or vomiting. No personal history of kidney stone. She has not tried any otc medication, but has been drinking more water.   Insomnia: she is doing better since she started to take Seroquel at 8 pm, no longer feeling tired in the morning. She states took Duloxetine at night and woke up feeling nauseated, advised to switch for the am.   HTN: doing great , today bp is at goal, no dizziness, chest pain or palpitation . Denies lower extremity edema.    Patient Active Problem List   Diagnosis Date Noted  . Mild intermittent asthma without complication 10/20/2020  . Dysthymia 10/20/2020  . Primary osteoarthritis of both knees 10/20/2020  . Primary osteoarthritis of right knee 04/06/2020  . Primary osteoarthritis of left knee 04/06/2020  . Pre-diabetes 12/05/2018  . Malabsorption of iron 11/07/2018  . Chronic pain of right knee 07/26/2017  . Vitamin D deficiency 04/24/2017  . B12 deficiency 04/24/2017  . Abnormal mammogram of right breast 01/01/2017  . Asthma, mild persistent 12/17/2016  . History of shoulder surgery 10/12/2016  . Primary osteoarthritis of left shoulder 06/14/2016  . Incomplete tear of left rotator cuff 05/30/2016  . Rotator cuff tendinitis 05/30/2016  . History of bariatric surgery 05/04/2016  . Allergic rhinitis 05/12/2015  . Benign essential HTN 05/12/2015  . Anxiety and depression 05/12/2015  . Insomnia, persistent 05/12/2015  . Dyslipidemia 05/12/2015  . Edema extremities 05/12/2015  .  Gastroesophageal reflux disease without esophagitis 05/12/2015  . Hypoglycemia 05/12/2015  . Eczema intertrigo 05/12/2015  . Migraine 05/12/2015  . Plantar fasciitis 05/12/2015  . Umbilical hernia without obstruction and without gangrene 05/12/2015  . Morbid obesity, unspecified obesity type (HCC) 02/13/2008    Past Surgical History:  Procedure Laterality Date  . ABDOMINAL HYSTERECTOMY  2012  . CHOLECYSTECTOMY    . COLONOSCOPY WITH PROPOFOL N/A 12/18/2019   Procedure: COLONOSCOPY WITH PROPOFOL;  Surgeon: Toney Reil, MD;  Location: [facility];  Service: Gastroenterology;  Laterality: N/A;  . DILATION AND CURETTAGE OF UTERUS    . ESOPHAGOGASTRODUODENOSCOPY (EGD) WITH PROPOFOL N/A 03/18/2020   Procedure: ESOPHAGOGASTRODUODENOSCOPY (EGD) WITH PROPOFOL;  Surgeon: Toney Reil, MD;  Location: [facility];  Service: Gastroenterology;  Laterality: N/A;  . GASTRIC BYPASS  2005  . INDUCED ABORTION N/A 1997   patient was about 22 weeks  . POLYPECTOMY  12/18/2019   Procedure: POLYPECTOMY;  Surgeon: Toney Reil, MD;  Location: [facility];  Service: Gastroenterology;;  . SHOULDER ARTHROSCOPY WITH OPEN ROTATOR CUFF REPAIR Left 06/14/2016   Procedure: SHOULDER ARTHROSCOPY WITH OPEN ROTATOR CUFF REPAIR, DECOMPRESSION, EXCISION LOOSE BODY, DEBRIDEMENT;  Surgeon: Christena Flake, MD;  Location: [facility];  Service: Orthopedics;  Laterality: Left;    Family History  Problem Relation Age of Onset  . Hypertension Mother   . Cancer Father        prostate  . Heart disease Brother   . Breast cancer Cousin 40       mat  cousin    Social History   Tobacco Use  . Smoking status: Never Smoker  . Smokeless tobacco: Never Used  Substance Use Topics  . Alcohol use: No    Alcohol/week: 0.0 standard drinks     Current Outpatient Medications:  .  amLODipine (NORVASC) 2.5 MG tablet, Take 1 tablet (2.5 mg total) by mouth daily., Disp: 90 tablet, Rfl: 1 .   Cyanocobalamin (B-12) 1000 MCG SUBL, Place 1 tablet under the tongue daily., Disp: 30 each, Rfl: 5 .  DULoxetine (CYMBALTA) 30 MG capsule, Take 1-2 capsules (30-60 mg total) by mouth daily. After first week go up to two daily, Disp: 60 capsule, Rfl: 0 .  fluticasone (FLONASE) 50 MCG/ACT nasal spray, Place 2 sprays into both nostrils daily., Disp: 16 g, Rfl: 0 .  Insulin Pen Needle (NOVOFINE) 32G X 6 MM MISC, 1 each by Does not apply route daily., Disp: 100 each, Rfl: 2 .  Loratadine 10 MG CAPS, Take 1 capsule by mouth as needed., Disp: , Rfl:  .  Naltrexone-buPROPion HCl ER (CONTRAVE) 8-90 MG TB12, Start 1 tablet every morning for 7 days, then 1 tablet twice daily for 7 days, then 2 tablets every morning and one every evening, Disp: 120 tablet, Rfl: 0 .  nystatin cream (MYCOSTATIN), Apply 1 application topically 2 (two) times daily., Disp: 30 g, Rfl: 5 .  omeprazole (PRILOSEC) 40 MG capsule, TAKE 1 CAPSULE(40 MG) BY two times a day before meals, Disp: 180 capsule, Rfl: 1 .  QUEtiapine (SEROQUEL) 25 MG tablet, Take 1 tablet (25 mg total) by mouth at bedtime. In place of trazodone for sleep, Disp: 90 tablet, Rfl: 0 .  rosuvastatin (CRESTOR) 10 MG tablet, Take 1 tablet (10 mg total) by mouth daily., Disp: 90 tablet, Rfl: 1 .  triamterene-hydrochlorothiazide (MAXZIDE-25) 37.5-25 MG tablet, Take 1 tablet by mouth daily. In place of HCTZ 25 , BP medication, Disp: 90 tablet, Rfl: 1 .  VENTOLIN HFA 108 (90 Base) MCG/ACT inhaler, INHALE 2 PUFFS INTO THE LUNGS EVERY 6 HOURS AS NEEDED FOR WHEEZING OR SHORTNESS OF BREATH, Disp: 18 g, Rfl: 0 .  Vitamin D, Ergocalciferol, (DRISDOL) 1.25 MG (50000 UNIT) CAPS capsule, Take 1 capsule (50,000 Units total) by mouth every 7 (seven) days., Disp: 12 capsule, Rfl: 1  Allergies  Allergen Reactions  . Lisinopril Swelling    Facial Swelling  . Ace Inhibitors     angioedema  . Codeine Swelling  . Valsartan Swelling    I personally reviewed active problem list,  medication list, allergies, family history, social history, health maintenance with the patient/caregiver today.   ROS  Ten systems reviewed and is negative except as mentioned in HPI   Objective  Vitals:   10/25/20 0749  BP: 128/80  Pulse: 79  Resp: 16  Temp: 98 F (36.7 C)  TempSrc: Oral  SpO2: 99%  Weight: 260 lb 14.4 oz (118.3 kg)  Height: 5\' 7"  (1.702 m)    Body mass index is 40.86 kg/m.  Physical Exam  Constitutional: Patient appears well-developed and well-nourished. Obese  No distress.  HEENT: head atraumatic, normocephalic, pupils equal and reactive to light, neck supple Cardiovascular: Normal rate, regular rhythm and normal heart sounds.  No murmur heard. No BLE edema. Pulmonary/Chest: Effort normal and breath sounds normal. No respiratory distress. Abdominal: Soft.  There is no tenderness.Negative CVA tenderness  Psychiatric: Patient has a normal mood and affect. behavior is normal. Judgment and thought content normal.  Recent Results (from the past 2160  hour(s))  Ferritin     Status: None   Collection Time: 09/07/20  2:09 PM  Result Value Ref Range   Ferritin 35 11 - 307 ng/mL    Comment: Performed at [facility], [address] [place], [place] [address]  CBC with Differential/Platelet     Status: None   Collection Time: 09/07/20  2:09 PM  Result Value Ref Range   WBC 6.8 4.0 - 10.5 K/uL   RBC 4.34 3.87 - 5.11 MIL/uL   Hemoglobin 13.1 12.0 - 15.0 g/dL   HCT 60.438.5 54.036.0 - 98.146.0 %   MCV 88.7 80.0 - 100.0 fL   MCH 30.2 26.0 - 34.0 pg   MCHC 34.0 30.0 - 36.0 g/dL   RDW 19.113.0 47.811.5 - 29.515.5 %   Platelets 260 150 - 400 K/uL   nRBC 0.0 0.0 - 0.2 %   Neutrophils Relative % 57 %   Neutro Abs 3.9 1.7 - 7.7 K/uL   Lymphocytes Relative 33 %   Lymphs Abs 2.3 0.7 - 4.0 K/uL   Monocytes Relative 7 %   Monocytes Absolute 0.5 0.1 - 1.0 K/uL   Eosinophils Relative 2 %   Eosinophils Absolute 0.1 0.0 - 0.5 K/uL   Basophils Relative 1 %   Basophils Absolute  0.0 0.0 - 0.1 K/uL   Immature Granulocytes 0 %   Abs Immature Granulocytes 0.01 0.00 - 0.07 K/uL    Comment: Performed at [facility], [address] [place], [place] [address]  B12 and Folate Panel     Status: None   Collection Time: 10/20/20 11:49 AM  Result Value Ref Range   Vitamin B-12 560 200 - 1,100 pg/mL   Folate 9.6 ng/mL    Comment:                            Reference Range                            Low:           <3.4                            Borderline:    3.4-5.4                            Normal:        >5.4 .   Lipid panel     Status: None   Collection Time: 10/20/20 11:49 AM  Result Value Ref Range   Cholesterol 161 <200 mg/dL   HDL 62 > OR = 50 mg/dL   Triglycerides 865131 <784<150 mg/dL   LDL Cholesterol (Calc) 78 mg/dL (calc)    Comment: Reference range: <100 . Desirable range <100 mg/dL for primary prevention;   <70 mg/dL for patients with CHD or diabetic patients  with > or = 2 CHD risk factors. [name]. LDL-C is now calculated using the Martin-Hopkins  calculation, which is a validated novel method providing  better accuracy than the Friedewald equation in the  estimation of LDL-C.  Horald PollenMartin SS et al. Lenox AhrJAMA. 6962;952(842013;310(19): 2061-2068  (http://education.[facility].com/faq/FAQ164)    Total CHOL/HDL Ratio 2.6 <5.0 (calc)   Non-HDL Cholesterol (Calc) 99 <132<130 mg/dL (calc)    Comment: For patients with diabetes plus 1 major ASCVD risk  factor, treating to a non-HDL-C  goal of <100 mg/dL  (LDL-C of <27 mg/dL) is considered a therapeutic  option.   COMPLETE METABOLIC PANEL WITH GFR     Status: None   Collection Time: 10/20/20 11:49 AM  Result Value Ref Range   Glucose, Bld 87 65 - 99 mg/dL    Comment: .            Fasting reference interval .    BUN 14 7 - 25 mg/dL   Creat 0.62 3.76 - 2.83 mg/dL    Comment: For patients >34 years of age, the reference limit for Creatinine is approximately 13% higher for people identified as  African-American. .    GFR, Est Non African American 91 > OR = 60 mL/min/1.88m2   GFR, Est African American 106 > OR = 60 mL/min/1.19m2   BUN/Creatinine Ratio NOT APPLICABLE 6 - 22 (calc)   Sodium 140 135 - 146 mmol/L   Potassium 4.2 3.5 - 5.3 mmol/L   Chloride 104 98 - 110 mmol/L   CO2 31 20 - 32 mmol/L   Calcium 9.2 8.6 - 10.4 mg/dL   Total Protein 6.1 6.1 - 8.1 g/dL   Albumin 3.9 3.6 - 5.1 g/dL   Globulin 2.2 1.9 - 3.7 g/dL (calc)   AG Ratio 1.8 1.0 - 2.5 (calc)   Total Bilirubin 0.4 0.2 - 1.2 mg/dL   Alkaline phosphatase (APISO) 92 37 - 153 U/L   AST 12 10 - 35 U/L   ALT 9 6 - 29 U/L  Hemoglobin A1c     Status: Abnormal   Collection Time: 10/20/20 11:49 AM  Result Value Ref Range   Hgb A1c MFr Bld 5.9 (H) <5.7 % of total Hgb    Comment: For someone without known diabetes, a hemoglobin  A1c value between 5.7% and 6.4% is consistent with prediabetes and should be confirmed with a  follow-up test. . For someone with known diabetes, a value <7% indicates that their diabetes is well controlled. A1c targets should be individualized based on duration of diabetes, age, comorbid conditions, and other considerations. . This assay result is consistent with an increased risk of diabetes. . Currently, no consensus exists regarding use of hemoglobin A1c for diagnosis of diabetes for children. .    Mean Plasma Glucose 123 mg/dL   eAG (mmol/L) 6.8 mmol/L  VITAMIN D 25 Hydroxy (Vit-D Deficiency, Fractures)     Status: None   Collection Time: 10/20/20 11:49 AM  Result Value Ref Range   Vit D, 25-Hydroxy 41 30 - 100 ng/mL    Comment: Vitamin D Status         25-OH Vitamin D: . Deficiency:                    <20 ng/mL Insufficiency:             20 - 29 ng/mL Optimal:                 > or = 30 ng/mL . For 25-OH Vitamin D testing on patients on  D2-supplementation and patients for whom quantitation  of D2 and D3 fractions is required, the [facility](TM) 25-OH VIT D, (D2,D3),  LC/MS/MS is recommended: order  code 15176 (patients >27yrs). See Note 1 . Note 1 . For additional information, please refer to  http://education.[facility].com/faq/FAQ199  (This link is being provided for informational/ educational purposes only.)       PHQ2/9: Depression screen [facility] 2/9 10/25/2020 10/20/2020 09/12/2020 09/12/2020 07/20/2020  Decreased Interest 1  Down, Depressed, Hopeless PHQ - 2 Score Altered sleeping - 3  Tired, decreased energy - 2  Change in appetite - 2  Feeling bad or failure about yourself  0 3 1 - 1  Trouble concentrating - 1  Moving slowly or fidgety/restless 0 0 0 - 0  Suicidal thoughts 0 0 0 - 0  PHQ-9 Score - 11  Difficult doing work/chores - - Not difficult at all - Not difficult at all  Some recent data might be hidden    phq 9 is positive   Fall Risk: Fall Risk  10/25/2020 10/20/2020 09/12/2020 07/20/2020 02/26/2020  Falls in the past year? 0 0 0 0 0  Number falls in past yr: 0 0 0 0 0  Injury with Fall? 0 0 0 0 0  Follow up - - - - -     Functional Status Survey: Is the patient deaf or have difficulty hearing?: Yes Does the patient have difficulty seeing, even when wearing glasses/contacts?: Yes Does the patient have difficulty concentrating, remembering, or making decisions?: Yes Does the patient have difficulty walking or climbing stairs?: Yes Does the patient have difficulty dressing or bathing?: No Does the patient have difficulty doing errands alone such as visiting a doctor's office or shopping?: No    Assessment & Plan  1. Dysuria  - CULTURE, URINE COMPREHENSIVE - nitrofurantoin, macrocrystal-monohydrate, (MACROBID) 100 MG capsule; Take 1 capsule (100 mg total) by mouth 2 (two) times daily.  Dispense: 10 capsule; Refill: 0  2. Benign essential HTN  At goal, continue medication

## 2020-10-25 ENCOUNTER — Ambulatory Visit (INDEPENDENT_AMBULATORY_CARE_PROVIDER_SITE_OTHER): Payer: BC Managed Care – PPO | Admitting: Family Medicine

## 2020-10-25 ENCOUNTER — Encounter: Payer: Self-pay | Admitting: Family Medicine

## 2020-10-25 ENCOUNTER — Other Ambulatory Visit: Payer: Self-pay | Admitting: Family Medicine

## 2020-10-25 ENCOUNTER — Other Ambulatory Visit: Payer: Self-pay

## 2020-10-25 VITALS — BP 128/80 | HR 79 | Temp 98.0°F | Resp 16 | Ht 67.0 in | Wt 260.9 lb

## 2020-10-25 DIAGNOSIS — I10 Essential (primary) hypertension: Secondary | ICD-10-CM | POA: Diagnosis not present

## 2020-10-25 DIAGNOSIS — R300 Dysuria: Secondary | ICD-10-CM

## 2020-10-25 DIAGNOSIS — Z1231 Encounter for screening mammogram for malignant neoplasm of breast: Secondary | ICD-10-CM | POA: Diagnosis not present

## 2020-10-25 MED ORDER — NITROFURANTOIN MONOHYD MACRO 100 MG PO CAPS: 100.0000 mg | ORAL_CAPSULE | Freq: Two times a day (BID) | ORAL | 0 refills | Status: DC

## 2020-10-25 NOTE — Addendum Note (Signed)
Addended by: Alba Cory F on: 10/25/2020 08:13 AM   Modules accepted: Orders

## 2020-10-27 LAB — CULTURE, URINE COMPREHENSIVE
MICRO NUMBER:: 11347971
SPECIMEN QUALITY:: ADEQUATE

## 2020-11-02 ENCOUNTER — Inpatient Hospital Stay: Payer: BC Managed Care – PPO

## 2020-11-09 ENCOUNTER — Ambulatory Visit: Payer: BC Managed Care – PPO | Admitting: Family Medicine

## 2020-11-22 ENCOUNTER — Other Ambulatory Visit: Payer: Self-pay | Admitting: Hematology and Oncology

## 2020-11-23 ENCOUNTER — Other Ambulatory Visit: Payer: Self-pay

## 2020-11-23 ENCOUNTER — Encounter: Payer: Self-pay | Admitting: Family Medicine

## 2020-11-23 ENCOUNTER — Ambulatory Visit
Admission: RE | Admit: 2020-11-23 | Discharge: 2020-11-23 | Disposition: A | Discharge status: Home / Self Care | Payer: BC Managed Care – PPO | Source: Ambulatory Visit | Attending: Family Medicine | Admitting: Family Medicine

## 2020-11-23 DIAGNOSIS — Z1231 Encounter for screening mammogram for malignant neoplasm of breast: Secondary | ICD-10-CM | POA: Insufficient documentation

## 2020-11-28 ENCOUNTER — Other Ambulatory Visit: Payer: Self-pay

## 2020-11-28 DIAGNOSIS — K909 Intestinal malabsorption, unspecified: Secondary | ICD-10-CM

## 2020-11-28 DIAGNOSIS — K9589 Other complications of other bariatric procedure: Secondary | ICD-10-CM

## 2020-11-28 DIAGNOSIS — D508 Other iron deficiency anemias: Secondary | ICD-10-CM

## 2020-11-29 ENCOUNTER — Other Ambulatory Visit: Payer: Self-pay

## 2020-11-29 ENCOUNTER — Inpatient Hospital Stay: Payer: BC Managed Care – PPO | Attending: Hematology and Oncology

## 2020-11-29 DIAGNOSIS — Z8249 Family history of ischemic heart disease and other diseases of the circulatory system: Secondary | ICD-10-CM | POA: Diagnosis not present

## 2020-11-29 DIAGNOSIS — K909 Intestinal malabsorption, unspecified: Secondary | ICD-10-CM

## 2020-11-29 DIAGNOSIS — Z9884 Bariatric surgery status: Secondary | ICD-10-CM | POA: Insufficient documentation

## 2020-11-29 DIAGNOSIS — I10 Essential (primary) hypertension: Secondary | ICD-10-CM | POA: Diagnosis not present

## 2020-11-29 DIAGNOSIS — D508 Other iron deficiency anemias: Secondary | ICD-10-CM | POA: Insufficient documentation

## 2020-11-29 DIAGNOSIS — K9589 Other complications of other bariatric procedure: Secondary | ICD-10-CM

## 2020-11-29 DIAGNOSIS — Z794 Long term (current) use of insulin: Secondary | ICD-10-CM | POA: Diagnosis not present

## 2020-11-29 DIAGNOSIS — Z803 Family history of malignant neoplasm of breast: Secondary | ICD-10-CM | POA: Insufficient documentation

## 2020-11-29 DIAGNOSIS — E538 Deficiency of other specified B group vitamins: Secondary | ICD-10-CM | POA: Insufficient documentation

## 2020-11-29 DIAGNOSIS — Z79899 Other long term (current) drug therapy: Secondary | ICD-10-CM | POA: Insufficient documentation

## 2020-11-29 DIAGNOSIS — Z90710 Acquired absence of both cervix and uterus: Secondary | ICD-10-CM | POA: Diagnosis not present

## 2020-11-29 DIAGNOSIS — Z8042 Family history of malignant neoplasm of prostate: Secondary | ICD-10-CM | POA: Insufficient documentation

## 2020-11-29 LAB — CBC WITH DIFFERENTIAL/PLATELET
Abs Immature Granulocytes: 0.01 10*3/uL (ref 0.00–0.07)
Basophils Absolute: 0 10*3/uL (ref 0.0–0.1)
Basophils Relative: 0 %
Eosinophils Absolute: 0.1 10*3/uL (ref 0.0–0.5)
Eosinophils Relative: 1 %
HCT: 36.3 % (ref 36.0–46.0)
Hemoglobin: 12.3 g/dL (ref 12.0–15.0)
Immature Granulocytes: 0 %
Lymphocytes Relative: 26 %
Lymphs Abs: 1.8 10*3/uL (ref 0.7–4.0)
MCH: 30.1 pg (ref 26.0–34.0)
MCHC: 33.9 g/dL (ref 30.0–36.0)
MCV: 89 fL (ref 80.0–100.0)
Monocytes Absolute: 0.5 10*3/uL (ref 0.1–1.0)
Monocytes Relative: 7 %
Neutro Abs: 4.4 10*3/uL (ref 1.7–7.7)
Neutrophils Relative %: 66 %
Platelets: 242 10*3/uL (ref 150–400)
RBC: 4.08 MIL/uL (ref 3.87–5.11)
RDW: 13.1 % (ref 11.5–15.5)
WBC: 6.7 10*3/uL (ref 4.0–10.5)
nRBC: 0 % (ref 0.0–0.2)

## 2020-11-29 LAB — IRON AND TIBC
Iron: 73 ug/dL (ref 28–170)
Saturation Ratios: 20 % (ref 10.4–31.8)
TIBC: 372 ug/dL (ref 250–450)
UIBC: 299 ug/dL

## 2020-11-29 LAB — FERRITIN: Ferritin: 47 ng/mL (ref 11–307)

## 2020-11-29 NOTE — Progress Notes (Signed)
[facility]  [address] [place], [place] [address] Phone: [phone]  Fax: [phone]   Clinic Day:  11/30/2020  Referring physician: Alba Cory, MD  Chief Complaint: Savannah Manning is a 60 y.o. female with s/p gastric bypass surgery with iron deficiency anemia and B12 deficiency who is seen for 6 month assessment.   HPI: The patient was last seen in the hematology clinic on 06/15/2020.  At that time, she felt "wonderful".  She denied any bleeding.  Exam was stable. Hematocrit was 37.2, hemoglobin 12.8, platelets 256,000, WBC 6,500. Ferritin was 35 with an iron saturation of 19% and a TIBC of 407. She received a vitamin B12 injection.  The patient received vitamin B12 injections monthly x 4 (07/13/2020 - 10/06/2020).  Bilateral screening mammogram on 11/23/2020 revealed no evidence of malignancy.  CBC has been followed: 09/07/2020:  Hematocrit 38.5, hemoglobin 13.1, MCV 88.7, platelets 260,000, WBC 6,800.  Ferritin 35. 11/29/2020:  Hematocrit 36.3, hemoglobin 12.3, MCV 89.0, platelets 242,000, WBC 6,700.  Ferritin 47 with an iron saturation of 20% and a TIBC of 372.  During the interim, she has felt "great." She eats chicken and salads. She denies ice pica or any other cravings. She denies restless legs and bleeding of any kind. Her leg cramps at night and shakiness have resolved.  The patient still struggles with hip bursitis and knee arthritis. She is working with another one of her doctors to find the right dose of medication for this.   Past Medical History:  Diagnosis Date  . Anemia   . Arthritis   . Asthma   . GERD (gastroesophageal reflux disease)   . Headache   . Hypertension   . Iron deficiency anemia 12/05/2018  . Wears contact lenses     Past Surgical History:  Procedure Laterality Date  . ABDOMINAL HYSTERECTOMY  2012  . CHOLECYSTECTOMY    . COLONOSCOPY WITH PROPOFOL N/A 12/18/2019   Procedure: COLONOSCOPY WITH  PROPOFOL;  Surgeon: Toney Reil, MD;  Location: [facility];  Service: Gastroenterology;  Laterality: N/A;  . DILATION AND CURETTAGE OF UTERUS    . ESOPHAGOGASTRODUODENOSCOPY (EGD) WITH PROPOFOL N/A 03/18/2020   Procedure: ESOPHAGOGASTRODUODENOSCOPY (EGD) WITH PROPOFOL;  Surgeon: Toney Reil, MD;  Location: [facility];  Service: Gastroenterology;  Laterality: N/A;  . GASTRIC BYPASS  2005  . INDUCED ABORTION N/A 1997   patient was about 22 weeks  . POLYPECTOMY  12/18/2019   Procedure: POLYPECTOMY;  Surgeon: Toney Reil, MD;  Location: [facility];  Service: Gastroenterology;;  . SHOULDER ARTHROSCOPY WITH OPEN ROTATOR CUFF REPAIR Left 06/14/2016   Procedure: SHOULDER ARTHROSCOPY WITH OPEN ROTATOR CUFF REPAIR, DECOMPRESSION, EXCISION LOOSE BODY, DEBRIDEMENT;  Surgeon: Christena Flake, MD;  Location: [facility];  Service: Orthopedics;  Laterality: Left;    Family History  Problem Relation Age of Onset  . Hypertension Mother   . Cancer Father        prostate  . Heart disease Brother   . Breast cancer Cousin 40       mat cousin    Social History:  reports that she has never smoked. She has never used smokeless tobacco. She reports that she does not drink alcohol and does not use drugs. 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  She lives in [place]. The patient is alone today.  Allergies:  Allergies  Allergen Reactions  . Lisinopril Swelling    Facial Swelling  . Ace Inhibitors  angioedema  . Codeine Swelling  . Valsartan Swelling    Current Medications: Current Outpatient Medications  Medication Sig Dispense Refill  . amLODipine (NORVASC) 2.5 MG tablet Take 1 tablet (2.5 mg total) by mouth daily. 90 tablet 1  . Cyanocobalamin (B-12) 1000 MCG SUBL Place 1 tablet under the tongue daily. 30 each 5  . DULoxetine (CYMBALTA) 30 MG capsule Take 1-2 capsules (30-60 mg total) by mouth daily. After first week go up to two daily 60 capsule 0   . Loratadine 10 MG CAPS Take 1 capsule by mouth as needed.    . Naltrexone-buPROPion HCl ER (CONTRAVE) 8-90 MG TB12 Start 1 tablet every morning for 7 days, then 1 tablet twice daily for 7 days, then 2 tablets every morning and one every evening 120 tablet 0  . nitrofurantoin, macrocrystal-monohydrate, (MACROBID) 100 MG capsule Take 1 capsule (100 mg total) by mouth 2 (two) times daily. 10 capsule 0  . nystatin cream (MYCOSTATIN) Apply 1 application topically 2 (two) times daily. (Patient taking differently: Apply 1 application topically as needed.) 30 g 5  . omeprazole (PRILOSEC) 40 MG capsule TAKE 1 CAPSULE(40 MG) BY two times a day before meals 180 capsule 1  . QUEtiapine (SEROQUEL) 25 MG tablet Take 1 tablet (25 mg total) by mouth at bedtime. In place of trazodone for sleep 90 tablet 0  . rosuvastatin (CRESTOR) 10 MG tablet Take 1 tablet (10 mg total) by mouth daily. 90 tablet 1  . triamterene-hydrochlorothiazide (MAXZIDE-25) 37.5-25 MG tablet Take 1 tablet by mouth daily. In place of HCTZ 25 , BP medication 90 tablet 1  . VENTOLIN HFA 108 (90 Base) MCG/ACT inhaler INHALE 2 PUFFS INTO THE LUNGS EVERY 6 HOURS AS NEEDED FOR WHEEZING OR SHORTNESS OF BREATH 18 g 0  . Vitamin D, Ergocalciferol, (DRISDOL) 1.25 MG (50000 UNIT) CAPS capsule Take 1 capsule (50,000 Units total) by mouth every 7 (seven) days. 12 capsule 1  . fluticasone (FLONASE) 50 MCG/ACT nasal spray Place 2 sprays into both nostrils daily. (Patient not taking: Reported on 11/30/2020) 16 g 0  . Insulin Pen Needle (NOVOFINE) 32G X 6 MM MISC 1 each by Does not apply route daily. (Patient not taking: Reported on 11/30/2020) 100 each 2   No current facility-administered medications for this visit.    Review of Systems  Constitutional: Positive for weight loss (3 lbs). Negative for chills, diaphoresis, fever and malaise/fatigue.       Feels "great."  HENT: Negative.  Negative for congestion, ear discharge, ear pain, hearing loss,  nosebleeds, sinus pain, sore throat and tinnitus.   Eyes: Negative.  Negative for blurred vision, double vision and photophobia.  Respiratory: Negative.  Negative for cough, hemoptysis, sputum production, shortness of breath and wheezing.   Cardiovascular: Negative.  Negative for chest pain, palpitations, orthopnea and leg swelling.  Gastrointestinal: Negative for abdominal pain, blood in stool, constipation, diarrhea, heartburn, melena, nausea and vomiting.  Genitourinary: Negative.  Negative for dysuria, frequency and urgency.  Musculoskeletal: Positive for joint pain (hip bursitis; knee arthritis). Negative for back pain, myalgias and neck pain.  Skin: Negative.  Negative for itching and rash.  Neurological: Negative.  Negative for dizziness, tingling, sensory change, speech change, focal weakness, weakness and headaches.  Endo/Heme/Allergies: Negative.  Does not bruise/bleed easily.  Psychiatric/Behavioral: Negative.  Negative for depression and memory loss. The patient is not nervous/anxious and does not have insomnia.   All other systems reviewed and are negative.  Performance status (ECOG): 0  Vitals Blood  pressure (!) 143/85, pulse (!) 52, temperature 98.8 F (37.1 C), temperature source Tympanic, resp. rate 18, weight 258 lb 6.1 oz (117.2 kg), SpO2 100 %.   Physical Exam Vitals and nursing note reviewed.  Constitutional:      General: She is not in acute distress.    Appearance: She is well-developed. She is not diaphoretic.     Interventions: Face mask in place.  HENT:     Head: Normocephalic and atraumatic.     Mouth/Throat:     Mouth: Mucous membranes are moist.     Pharynx: Oropharynx is clear. No oropharyngeal exudate.  Eyes:     General: No scleral icterus.    Extraocular Movements: Extraocular movements intact.     Conjunctiva/sclera: Conjunctivae normal.     Pupils: Pupils are equal, round, and reactive to light.     Comments: Glasses.  Brown eyes.  Neck:      Vascular: No JVD.  Cardiovascular:     Rate and Rhythm: Normal rate and regular rhythm.     Heart sounds: Normal heart sounds. No murmur heard.   Pulmonary:     Effort: Pulmonary effort is normal. No respiratory distress.     Breath sounds: Normal breath sounds. No wheezing or rales.  Chest:     Chest wall: No tenderness.  Breasts:     Right: No supraclavicular adenopathy.     Left: No supraclavicular adenopathy.    Abdominal:     General: Bowel sounds are normal. There is no distension.     Palpations: Abdomen is soft. There is no mass.     Tenderness: There is no abdominal tenderness. There is no guarding or rebound.  Musculoskeletal:        General: No swelling or tenderness. Normal range of motion.     Cervical back: Normal range of motion and neck supple.  Lymphadenopathy:     Head:     Right side of head: No preauricular, posterior auricular or occipital adenopathy.     Left side of head: No preauricular, posterior auricular or occipital adenopathy.     Cervical: No cervical adenopathy.     Upper Body:     Right upper body: No supraclavicular adenopathy.     Left upper body: No supraclavicular adenopathy.     Lower Body: No right inguinal adenopathy. No left inguinal adenopathy.  Skin:    General: Skin is warm and dry.     Coloration: Skin is not pale.     Findings: No erythema or rash.  Neurological:     Mental Status: She is alert and oriented to person, place, and time.  Psychiatric:        Behavior: Behavior normal.        Thought Content: Thought content normal.        Judgment: Judgment normal.    Appointment on 11/29/2020  Component Date Value Ref Range Status  . Iron 11/29/2020 73  28 - 170 ug/dL Final  . TIBC 16/08/9603 372  250 - 450 ug/dL Final  . Saturation Ratios 11/29/2020 20  10.4 - 31.8 % Final  . UIBC 11/29/2020 299  ug/dL Final   Performed at [facility], [address], [place], [place] [address]  . Ferritin 11/29/2020 47  11 -  307 ng/mL Final   Performed at [facility], [address] [place] [place], [place] [address]  . WBC 11/29/2020 6.7  4.0 - 10.5 K/uL Final  . RBC 11/29/2020 4.08  3.87 -  5.11 MIL/uL Final  . Hemoglobin 11/29/2020 12.3  12.0 - 15.0 g/dL Final  . HCT 97/12/6376 36.3  36.0 - 46.0 % Final  . MCV 11/29/2020 89.0  80.0 - 100.0 fL Final  . MCH 11/29/2020 30.1  26.0 - 34.0 pg Final  . MCHC 11/29/2020 33.9  30.0 - 36.0 g/dL Final  . RDW 58/85/0277 13.1  11.5 - 15.5 % Final  . Platelets 11/29/2020 242  150 - 400 K/uL Final  . nRBC 11/29/2020 0.0  0.0 - 0.2 % Final  . Neutrophils Relative % 11/29/2020 66  % Final  . Neutro Abs 11/29/2020 4.4  1.7 - 7.7 K/uL Final  . Lymphocytes Relative 11/29/2020 26  % Final  . Lymphs Abs 11/29/2020 1.8  0.7 - 4.0 K/uL Final  . Monocytes Relative 11/29/2020 7  % Final  . Monocytes Absolute 11/29/2020 0.5  0.1 - 1.0 K/uL Final  . Eosinophils Relative 11/29/2020 1  % Final  . Eosinophils Absolute 11/29/2020 0.1  0.0 - 0.5 K/uL Final  . Basophils Relative 11/29/2020 0  % Final  . Basophils Absolute 11/29/2020 0.0  0.0 - 0.1 K/uL Final  . Immature Granulocytes 11/29/2020 0  % Final  . Abs Immature Granulocytes 11/29/2020 0.01  0.00 - 0.07 K/uL Final   Performed at [facility], [address], [place], [place] [address]    Assessment:  Savannah Manning is a 60 y.o. female s/p gastric bypass surgery (2005) with iron deficiency anemia and B12 deficiency.    She is s/p hysterectomy in 2012.  Last colonoscopy was on 08/10/2014.  She has received Feraheme: 11/11/2018 and 11/18/2018.  Ferritin has been followed: 8 on 10/12/2016, 11 on 05/01/2017, 7 on 11/03/2018, 173 on 01/16/2019, 70 on 07/24/2019, 65 on 10/28/2019, 49 on 12/28/2019, 40 on 03/23/2020, 35 on 06/13/2020, 35 on 09/07/2020 and 47 on 11/29/2020.   She receives Venofer if her ferritin is < 30.  She receives B12 monthly (last 10/06/2020).  She missed some B12 injections during COVID-19.   Folate was 9.6 on 10/20/2020.  EGD on 03/18/2020 revealed Roux-en-Y gastrojejunostomy with gastrojejunal anastomosis characterized by healthy appearing mucosa  The patient received the Moderna COVID-19 vaccine on 01/15/2020 and 02/16/2020.  Symptomatically, she feels "great."  Diet is good.  She denies ice pica, restless legs or any bleeding.  Exam is stable.  Plan: 1.   Review labs from 11/29/2020. 2.   Iron deficiency anemia             Hematocrit 36.3.  Hemoglobin 12.3.  MCV 89.0.             Ferritin 47 and iron saturation of 20% and a TIBC of 372.  Patient receives Venofer if ferritin < 30.  Venofer needed today. 3.   B12 deficiency             Patient receives B12 monthly (last 10/06/2020.  B12 today and monthly x6. 4.   RTC in 3 months for labs (CBC, ferritin) and B12. 5.   RTC in 6 months for MD assessment, labs (CBC with diff, ferritin, iron studies-day before), B12 and +/- Venofer.   I discussed the assessment and treatment plan with the patient.  The patient was provided an opportunity to ask questions and all were answered.  The patient agreed with the plan and demonstrated an understanding of the instructions.  The patient was advised to call back if the symptoms worsen or if the condition fails to improve as anticipated.  Rosey BathMelissa C Sonia Bromell, MD, PhD    11/30/2020, 1:45 PM  I, Danella PentonEmily J Tufford, am acting as Neurosurgeonscribe for General MotorsMelissa C. Merlene Pullingorcoran, MD, PhD.  I, Nickey Kloepfer C. Merlene Pullingorcoran, MD, have reviewed the above documentation for accuracy and completeness, and I agree with the above.

## 2020-11-30 ENCOUNTER — Inpatient Hospital Stay (HOSPITAL_BASED_OUTPATIENT_CLINIC_OR_DEPARTMENT_OTHER): Payer: BC Managed Care – PPO | Admitting: Hematology and Oncology

## 2020-11-30 ENCOUNTER — Encounter: Payer: Self-pay | Admitting: Hematology and Oncology

## 2020-11-30 ENCOUNTER — Inpatient Hospital Stay: Payer: BC Managed Care – PPO

## 2020-11-30 VITALS — BP 143/85 | HR 52 | Temp 98.8°F | Resp 18 | Wt 258.4 lb

## 2020-11-30 DIAGNOSIS — D508 Other iron deficiency anemias: Secondary | ICD-10-CM

## 2020-11-30 DIAGNOSIS — Z803 Family history of malignant neoplasm of breast: Secondary | ICD-10-CM | POA: Diagnosis not present

## 2020-11-30 DIAGNOSIS — I10 Essential (primary) hypertension: Secondary | ICD-10-CM | POA: Diagnosis not present

## 2020-11-30 DIAGNOSIS — Z794 Long term (current) use of insulin: Secondary | ICD-10-CM | POA: Diagnosis not present

## 2020-11-30 DIAGNOSIS — E538 Deficiency of other specified B group vitamins: Secondary | ICD-10-CM | POA: Diagnosis not present

## 2020-11-30 DIAGNOSIS — Z79899 Other long term (current) drug therapy: Secondary | ICD-10-CM | POA: Diagnosis not present

## 2020-11-30 DIAGNOSIS — Z90710 Acquired absence of both cervix and uterus: Secondary | ICD-10-CM | POA: Diagnosis not present

## 2020-11-30 DIAGNOSIS — Z8249 Family history of ischemic heart disease and other diseases of the circulatory system: Secondary | ICD-10-CM | POA: Diagnosis not present

## 2020-11-30 DIAGNOSIS — Z9884 Bariatric surgery status: Secondary | ICD-10-CM | POA: Diagnosis not present

## 2020-11-30 DIAGNOSIS — K9589 Other complications of other bariatric procedure: Secondary | ICD-10-CM

## 2020-11-30 DIAGNOSIS — Z8042 Family history of malignant neoplasm of prostate: Secondary | ICD-10-CM | POA: Diagnosis not present

## 2020-11-30 DIAGNOSIS — K909 Intestinal malabsorption, unspecified: Secondary | ICD-10-CM

## 2020-11-30 MED ORDER — CYANOCOBALAMIN 1000 MCG/ML IJ SOLN
INTRAMUSCULAR | Status: AC
  Filled 2020-11-30: qty 1

## 2020-11-30 MED ORDER — CYANOCOBALAMIN 1000 MCG/ML IJ SOLN
1000.0000 ug | Freq: Once | INTRAMUSCULAR | Status: AC
  Administered 2020-11-30: 1000 ug via INTRAMUSCULAR

## 2020-11-30 NOTE — Patient Instructions (Signed)

## 2020-12-05 ENCOUNTER — Telehealth: Payer: Self-pay

## 2020-12-05 ENCOUNTER — Telehealth: Payer: Self-pay | Admitting: Gastroenterology

## 2020-12-05 MED ORDER — DEXLANSOPRAZOLE 60 MG PO CPDR: 60.0000 mg | DELAYED_RELEASE_CAPSULE | Freq: Every day | ORAL | 1 refills | Status: DC

## 2020-12-05 NOTE — Telephone Encounter (Signed)
Patient has questions about some pain she is having and bad taste in her mouth after belching, please call to advise

## 2020-12-05 NOTE — Telephone Encounter (Signed)
We can try dexilant 60mg  instead of prilosec. She can try some samples if we have any otherwise 30day prescription  RV

## 2020-12-05 NOTE — Telephone Encounter (Signed)
Copied from CRM [id number]. Topic: General - Inquiry >> Dec 05, 2020  8:38 AM Adrian Prince D wrote: Reason for CRM: Patient would like a call back from Dr. Carlynn Purl nurse. She has some questions. She can be reached at [phone]. Please advise

## 2020-12-05 NOTE — Telephone Encounter (Signed)
Patient states she would rather a the medication be called in to her pharmacy. Sent medication to the pharmacy

## 2020-12-05 NOTE — Telephone Encounter (Signed)
Patient is having left side right beside of her belly button area. Patient states the pain comes and goes. She states when she does have the pain she has belching and have a iron taste in her mouth. She states the pain is a dully aching pain. Patient is still taking the omeprazole 40mg  twice a day

## 2020-12-05 NOTE — Addendum Note (Signed)
Addended by: Radene Knee L on: 12/05/2020 09:15 AM   Modules accepted: Orders

## 2020-12-06 NOTE — Telephone Encounter (Signed)
Patient called and asked if there is another alternative to medications that was called in.  Patient states that the medication was too expensive, please call to advise

## 2020-12-06 NOTE — Telephone Encounter (Signed)
Dexilant recorded a PA for the medication. Just got noticed today. DID PA through cover my meds and sent clinicals

## 2020-12-07 NOTE — Telephone Encounter (Signed)
Patient states the pharmacy does not need a PA that the medication is getting applied to deductible. She states she will call her insurance and see what her deductible is

## 2020-12-07 NOTE — Telephone Encounter (Signed)
Patient states that the pharmacy did not tell her it needed a prior authorizations she said they just told her it was 300 dollars. I told her we would see what insurance says with the prior authorization. I told her she can come pick up some samples. She states she will do that sometime this week

## 2020-12-08 NOTE — Telephone Encounter (Signed)
Cover my meds cancelled request due to medication not needing a PA it was just applied to her dutiable

## 2020-12-21 ENCOUNTER — Ambulatory Visit (INDEPENDENT_AMBULATORY_CARE_PROVIDER_SITE_OTHER): Payer: BC Managed Care – PPO | Admitting: Advanced Practice Midwife

## 2020-12-21 ENCOUNTER — Other Ambulatory Visit (HOSPITAL_COMMUNITY)
Admission: RE | Admit: 2020-12-21 | Discharge: 2020-12-21 | Disposition: A | Discharge status: Home / Self Care | Payer: BC Managed Care – PPO | Source: Ambulatory Visit | Attending: Advanced Practice Midwife | Admitting: Advanced Practice Midwife

## 2020-12-21 ENCOUNTER — Encounter: Payer: Self-pay | Admitting: Advanced Practice Midwife

## 2020-12-21 VITALS — BP 135/80 | Ht 67.0 in | Wt 260.0 lb

## 2020-12-21 DIAGNOSIS — Z124 Encounter for screening for malignant neoplasm of cervix: Secondary | ICD-10-CM | POA: Diagnosis not present

## 2020-12-21 DIAGNOSIS — Z01419 Encounter for gynecological examination (general) (routine) without abnormal findings: Secondary | ICD-10-CM

## 2020-12-21 NOTE — Progress Notes (Signed)
Gynecology Annual Exam  PCP: Alba Cory, MD  Chief Complaint:  Chief Complaint  Patient presents with  . Annual Exam    History of Present Illness:Patient is a 60 y.o. G2P0011 presents for annual exam. The patient has no complaints today.   LMP: No LMP recorded. Patient has had a hysterectomy.  Postcoital Bleeding: not applicable Dysmenorrhea: not applicable  The patient is not currently sexually active. She denies dyspareunia.  The patient does perform self breast exams.  There is no notable family history of breast or ovarian cancer in her family.  The patient wears seatbelts: yes.   The patient has regular exercise: she is not exercising due to arthritis in her knees which makes it difficult and painful to bear weight. She is trying to eat healthy. She admits drinking sweet tea regularly. She usually has less than 7 hours of sleep.    The patient denies current symptoms of depression.     Review of Systems: Review of Systems  Constitutional: Negative for chills and fever.  HENT: Negative for congestion, ear discharge, ear pain, hearing loss, sinus pain and sore throat.   Eyes: Negative for blurred vision and double vision.  Respiratory: Negative for cough, shortness of breath and wheezing.   Cardiovascular: Negative for chest pain, palpitations and leg swelling.  Gastrointestinal: Negative for abdominal pain, blood in stool, constipation, diarrhea, heartburn, melena, nausea and vomiting.  Genitourinary: Negative for dysuria, flank pain, frequency, hematuria and urgency.  Musculoskeletal: Positive for joint pain. Negative for back pain and myalgias.  Skin: Negative for itching and rash.  Neurological: Negative for dizziness, tingling, tremors, sensory change, speech change, focal weakness, seizures, loss of consciousness, weakness and headaches.  Endo/Heme/Allergies: Negative for environmental allergies. Does not bruise/bleed easily.  Psychiatric/Behavioral: Negative  for depression, hallucinations, memory loss, substance abuse and suicidal ideas. The patient is not nervous/anxious and does not have insomnia.     Past Medical History:  Patient Active Problem List   Diagnosis Date Noted  . Mild intermittent asthma without complication 10/20/2020  . Dysthymia 10/20/2020  . Primary osteoarthritis of both knees 10/20/2020  . Primary osteoarthritis of right knee 04/06/2020  . Primary osteoarthritis of left knee 04/06/2020  . Pre-diabetes 12/05/2018  . Malabsorption of iron 11/07/2018  . Chronic pain of right knee 07/26/2017  . Vitamin D deficiency 04/24/2017  . B12 deficiency 04/24/2017  . Abnormal mammogram of right breast 01/01/2017  . Asthma, mild persistent 12/17/2016  . History of shoulder surgery 10/12/2016    Left, rotator cuff repair   . Primary osteoarthritis of left shoulder 06/14/2016  . Incomplete tear of left rotator cuff 05/30/2016  . Rotator cuff tendinitis 05/30/2016  . History of bariatric surgery 05/04/2016  . Allergic rhinitis 05/12/2015  . Benign essential HTN 05/12/2015  . Anxiety and depression 05/12/2015  . Insomnia, persistent 05/12/2015  . Dyslipidemia 05/12/2015    See lab work 04/2014   . Edema extremities 05/12/2015  . Gastroesophageal reflux disease without esophagitis 05/12/2015  . Hypoglycemia 05/12/2015  . Eczema intertrigo 05/12/2015  . Migraine 05/12/2015  . Plantar fasciitis 05/12/2015  . Umbilical hernia without obstruction and without gangrene 05/12/2015  . Morbid obesity, unspecified obesity type (HCC) 02/13/2008    In 2002 she underwent gastric bypass surgery in [place] at her greastest weight of 314lbs. After the surgery she had managed to reduce her weight to 164lbs. Over time she has gained some weight back slowly.     Past Surgical History:  Past Surgical History:  Procedure Laterality Date  . ABDOMINAL HYSTERECTOMY  2012  . CHOLECYSTECTOMY    . COLONOSCOPY WITH PROPOFOL N/A 12/18/2019    Procedure: COLONOSCOPY WITH PROPOFOL;  Surgeon: Toney ReilVanga, Rohini Reddy, MD;  [facility];  Service: Gastroenterology;  Laterality: N/A;  . DILATION AND CURETTAGE OF UTERUS    . ESOPHAGOGASTRODUODENOSCOPY (EGD) WITH PROPOFOL N/A 03/18/2020   Procedure: ESOPHAGOGASTRODUODENOSCOPY (EGD) WITH PROPOFOL;  Surgeon: Toney ReilVanga, Rohini Reddy, MD;  [facility];  Service: Gastroenterology;  Laterality: N/A;  . GASTRIC BYPASS  2005  . INDUCED ABORTION N/A 1997   patient was about 22 weeks  . POLYPECTOMY  12/18/2019   Procedure: POLYPECTOMY;  Surgeon: Toney ReilVanga, Rohini Reddy, MD;  [facility];  Service: Gastroenterology;;  . SHOULDER ARTHROSCOPY WITH OPEN ROTATOR CUFF REPAIR Left 06/14/2016   Procedure: SHOULDER ARTHROSCOPY WITH OPEN ROTATOR CUFF REPAIR, DECOMPRESSION, EXCISION LOOSE BODY, DEBRIDEMENT;  Surgeon: Christena FlakeJohn J Poggi, MD;  Location: [facility];  Service: Orthopedics;  Laterality: Left;    Gynecologic History:  No LMP recorded. Patient has had a hysterectomy. Last Pap: 2018 Results were:  no abnormalities  Last mammogram: 1 month ago Results were: BI-RAD I  Obstetric History: 632P0011  Family History:  Family History  Problem Relation Age of Onset  . Hypertension Mother   . Cancer Father        prostate  . Heart disease Brother   . Breast cancer Cousin 40       mat cousin    Social History:  Social History   Socioeconomic History  . Marital status: Single    Spouse name: Not on file  . Number of children: 1  . Years of education: Not on file  . Highest education level: Not on file  Occupational History  . Not on file  Tobacco Use  . Smoking status: Never Smoker  . Smokeless tobacco: Never Used  Vaping Use  . Vaping Use: Never used  Substance and Sexual Activity  . Alcohol use: No    Alcohol/week: 0.0 standard drinks  . Drug use: No  . Sexual activity: Not Currently  Other Topics Concern  . Not on file  Social History Narrative   Desk  job/works for insurance; no smoking; no alcohol; lives in [place].    Social Determinants of Health   Financial Resource Strain: Not on file  Food Insecurity: Not on file  Transportation Needs: Not on file  Physical Activity: Not on file  Stress: Not on file  Social Connections: Not on file  Intimate Partner Violence: Not on file    Allergies:  Allergies  Allergen Reactions  . Lisinopril Swelling    Facial Swelling  . Ace Inhibitors     angioedema  . Codeine Swelling  . Valsartan Swelling    Medications: Prior to Admission medications   Medication Sig Start Date End Date Taking? Authorizing Provider  amLODipine (NORVASC) 2.5 MG tablet Take 1 tablet (2.5 mg total) by mouth daily. 10/20/20  Yes Sowles, Danna HeftyKrichna, MD  Cyanocobalamin (B-12) 1000 MCG SUBL Place 1 tablet under the tongue daily. 03/05/19  Yes Sowles, Danna HeftyKrichna, MD  dexlansoprazole (DEXILANT) 60 MG capsule Take 1 capsule (60 mg total) by mouth daily. 12/05/20  Yes Vanga, Loel Dubonnetohini Reddy, MD  DULoxetine (CYMBALTA) 30 MG capsule Take 1-2 capsules (30-60 mg total) by mouth daily. After first week go up to two daily 10/20/20  Yes Sowles, Danna HeftyKrichna, MD  fluticasone [facility](FLONASE) 50 MCG/ACT nasal spray Place 2 sprays into both  nostrils daily. 11/03/18  Yes Doren Custard, FNP  Insulin Pen Needle (NOVOFINE) 32G X 6 MM MISC 1 each by Does not apply route daily. 01/04/20  Yes Sowles, Danna Hefty, MD  Loratadine 10 MG CAPS Take 1 capsule by mouth as needed.   Yes [provider]  Naltrexone-buPROPion HCl ER (CONTRAVE) 8-90 MG TB12 Start 1 tablet every morning for 7 days, then 1 tablet twice daily for 7 days, then 2 tablets every morning and one every evening 10/20/20  Yes Sowles, Danna Hefty, MD  nitrofurantoin, macrocrystal-monohydrate, (MACROBID) 100 MG capsule Take 1 capsule (100 mg total) by mouth 2 (two) times daily. 10/25/20  Yes Sowles, Danna Hefty, MD  nystatin cream (MYCOSTATIN) Apply 1 application topically 2 (two) times daily. Patient  taking differently: Apply 1 application topically as needed. 11/05/19  Yes Tresea Mall, CNM  omeprazole (PRILOSEC) 40 MG capsule TAKE 1 CAPSULE(40 MG) BY two times a day before meals 07/21/20  Yes Vanga, Loel Dubonnet, MD  QUEtiapine (SEROQUEL) 25 MG tablet Take 1 tablet (25 mg total) by mouth at bedtime. In place of trazodone for sleep 10/20/20  Yes Sowles, Danna Hefty, MD  rosuvastatin (CRESTOR) 10 MG tablet Take 1 tablet (10 mg total) by mouth daily. 10/20/20  Yes Sowles, Danna Hefty, MD  triamterene-hydrochlorothiazide (MAXZIDE-25) 37.5-25 MG tablet Take 1 tablet by mouth daily. In place of HCTZ 25 , BP medication 10/20/20  Yes Sowles, Danna Hefty, MD  VENTOLIN HFA 108 (90 Base) MCG/ACT inhaler INHALE 2 PUFFS INTO THE LUNGS EVERY 6 HOURS AS NEEDED FOR WHEEZING OR SHORTNESS OF BREATH 02/14/20  Yes Sowles, Danna Hefty, MD  Vitamin D, Ergocalciferol, (DRISDOL) 1.25 MG (50000 UNIT) CAPS capsule Take 1 capsule (50,000 Units total) by mouth every 7 (seven) days. 10/20/20  Yes Alba Cory, MD    Physical Exam Vitals: Blood pressure 135/80, height 5\' 7"  (1.702 m), weight 260 lb (117.9 kg).  General: NAD HEENT: normocephalic, anicteric Thyroid: no enlargement, no palpable nodules Pulmonary: No increased work of breathing, CTAB Cardiovascular: RRR, distal pulses 2+ Breast: Breast symmetrical, no tenderness, no palpable nodules or masses, no skin or nipple retraction present, no nipple discharge.  No axillary or supraclavicular lymphadenopathy. Abdomen: NABS, soft, non-tender, non-distended.  Umbilicus without lesions.  No hepatomegaly, splenomegaly or masses palpable. No evidence of hernia  Genitourinary:  External: Normal external female genitalia.  Normal urethral meatus, normal Bartholin's and Skene's glands.    Vagina: Normal vaginal mucosa, no evidence of prolapse.    Cervix: Grossly normal in appearance, no bleeding, no CMT  Uterus: surgically absent    Adnexa: deferred  Rectal: deferred  Lymphatic: no  evidence of inguinal lymphadenopathy Extremities: no edema, erythema, or tenderness Neurologic: Grossly intact Psychiatric: mood appropriate, affect full    Assessment: 60 y.o. G2P0011 routine annual exam  Plan: Problem List Items Addressed This Visit   None   Visit Diagnoses    Well woman exam with routine gynecological exam    -  Primary   Relevant Orders   Cytology - PAP   Screening for cervical cancer       Relevant Orders   Cytology - PAP      1) Mammogram - recommend yearly screening mammogram.  Mammogram Is up to date  2) STI screening  was not offered and therefore not obtained  3) ASCCP guidelines and rationale discussed.  Patient opts for every 5 years screening interval  4) Osteoporosis  - per USPTF routine screening DEXA at age 28  Consider FDA-approved medical therapies in postmenopausal women  and men aged 11 years and older, based on the following: a) A hip or vertebral (clinical or morphometric) fracture b) T-score ? -2.5 at the femoral neck or spine after appropriate evaluation to exclude secondary causes C) Low bone mass (T-score between -1.0 and -2.5 at the femoral neck or spine) and a 10-year probability of a hip fracture ? 3% or a 10-year probability of a major osteoporosis-related fracture ? 20% based on the [place]-adapted WHO algorithm   5) Routine healthcare maintenance including cholesterol, diabetes screening discussed managed by PCP  6) Colonoscopy is up to date and due in 2026 (polyps removed in 2021).  Screening recommended starting at age 39 for average risk individuals, age 18 for individuals deemed at increased risk (including African Americans) and recommended to continue until age 49.  For patient age 35-85 individualized approach is recommended.  Gold standard screening is via colonoscopy, Cologuard screening is an acceptable alternative for patient unwilling or unable to undergo colonoscopy.  "Colorectal cancer screening for average?risk adults:  2018 guideline update from the American Cancer Society"CA: A Cancer Journal for Clinicians: Apr 03, 2017   7) Return in about 1 year (around 12/21/2021) for annual established gyn.    Parke Poisson, CNM [facility] 12/21/20, 5:03 PM

## 2020-12-21 NOTE — Patient Instructions (Signed)
American Heart Association (AHA) Exercise Recommendation  Being physically active is important to prevent heart disease and stroke, the nation's No. 1and No. 5killers. To improve overall cardiovascular health, we suggest at least 150 minutes per week of moderate exercise or 75 minutes per week of vigorous exercise (or a combination of moderate and vigorous activity). Thirty minutes a day, five times a week is an easy goal to remember. You will also experience benefits even if you divide your time into two or three segments of 10 to 15 minutes per day.  For people who would benefit from lowering their blood pressure or cholesterol, we recommend 40 minutes of aerobic exercise of moderate to vigorous intensity three to four times a week to lower the risk for heart attack and stroke.  Physical activity is anything that makes you move your body and burn calories.  This includes things like climbing stairs or playing sports. Aerobic exercises benefit your heart, and include walking, jogging, swimming or biking. Strength and stretching exercises are best for overall stamina and flexibility.  The simplest, positive change you can make to effectively improve your heart health is to start walking. It's enjoyable, free, easy, social and great exercise. A walking program is flexible and boasts high success rates because people can stick with it. It's easy for walking to become a regular and satisfying part of life.   For Overall Cardiovascular Health:  At least 30 minutes of moderate-intensity aerobic activity at least 5 days per week for a total of 150  OR   At least 25 minutes of vigorous aerobic activity at least 3 days per week for a total of 75 minutes; or a combination of moderate- and vigorous-intensity aerobic activity  AND   Moderate- to high-intensity muscle-strengthening activity at least 2 days per week for additional health benefits.  For Lowering Blood Pressure and Cholesterol  An  average 40 minutes of moderate- to vigorous-intensity aerobic activity 3 or 4 times per week  What if I can't make it to the time goal? Something is always better than nothing! And everyone has to start somewhere. Even if you've been sedentary for years, today is the day you can begin to make healthy changes in your life. If you don't think you'll make it for 30 or 40 minutes, set a reachable goal for today. You can work up toward your overall goal by increasing your time as you get stronger. Don't let all-or-nothing thinking rob you of doing what you can every day.  Source:http://www.heart.org   Mediterranean Diet A Mediterranean diet refers to food and lifestyle choices that are based on the traditions of countries located on the [employer]. This way of eating has been shown to help prevent certain conditions and improve outcomes for people who have chronic diseases, like kidney disease and heart disease. What are tips for following this plan? Lifestyle  Cook and eat meals together with your family, when possible.  Drink enough fluid to keep your urine clear or pale yellow.  Be physically active every day. This includes: ? Aerobic exercise like running or swimming. ? Leisure activities like gardening, walking, or housework.  Get 7-8 hours of sleep each night.  If recommended by your health care provider, drink red wine in moderation. This means 1 glass a day for nonpregnant women and 2 glasses a day for men. A glass of wine equals 5 oz (150 mL). Reading food labels  Check the serving size of packaged foods. For foods such as rice  and pasta, the serving size refers to the amount of cooked product, not dry.  Check the total fat in packaged foods. Avoid foods that have saturated fat or trans fats.  Check the ingredients list for added sugars, such as corn syrup.   Shopping  At the grocery store, buy most of your food from the areas near the walls of the store. This  includes: ? Fresh fruits and vegetables (produce). ? Grains, beans, nuts, and seeds. Some of these may be available in unpackaged forms or large amounts (in bulk). ? Fresh seafood. ? Poultry and eggs. ? Low-fat dairy products.  Buy whole ingredients instead of prepackaged foods.  Buy fresh fruits and vegetables in-season from local farmers markets.  Buy frozen fruits and vegetables in resealable bags.  If you do not have access to quality fresh seafood, buy precooked frozen shrimp or canned fish, such as tuna, salmon, or sardines.  Buy small amounts of raw or cooked vegetables, salads, or olives from the deli or salad bar at your store.  Stock your pantry so you always have certain foods on hand, such as olive oil, canned tuna, canned tomatoes, rice, pasta, and beans. Cooking  Cook foods with extra-virgin olive oil instead of using butter or other vegetable oils.  Have meat as a side dish, and have vegetables or grains as your main dish. This means having meat in small portions or adding small amounts of meat to foods like pasta or stew.  Use beans or vegetables instead of meat in common dishes like chili or lasagna.  Experiment with different cooking methods. Try roasting or broiling vegetables instead of steaming or sauteing them.  Add frozen vegetables to soups, stews, pasta, or rice.  Add nuts or seeds for added healthy fat at each meal. You can add these to yogurt, salads, or vegetable dishes.  Marinate fish or vegetables using olive oil, lemon juice, garlic, and fresh herbs. Meal planning  Plan to eat 1 vegetarian meal one day each week. Try to work up to 2 vegetarian meals, if possible.  Eat seafood 2 or more times a week.  Have healthy snacks readily available, such as: ? Vegetable sticks with hummus. ? [place] yogurt. ? Fruit and nut trail mix.  Eat balanced meals throughout the week. This includes: ? Fruit: 2-3 servings a day ? Vegetables: 4-5 servings a  day ? Low-fat dairy: 2 servings a day ? Fish, poultry, or lean meat: 1 serving a day ? Beans and legumes: 2 or more servings a week ? Nuts and seeds: 1-2 servings a day ? Whole grains: 6-8 servings a day ? Extra-virgin olive oil: 3-4 servings a day  Limit red meat and sweets to only a few servings a month   What are my food choices?  Mediterranean diet ? Recommended  Grains: Whole-grain pasta. Brown rice. Bulgar wheat. Polenta. Couscous. Whole-wheat bread. [place].  Vegetables: Artichokes. Beets. Broccoli. Cabbage. Carrots. Eggplant. Green beans. Chard. Kale. Spinach. Onions. Leeks. Peas. Squash. Tomatoes. Peppers. Radishes.  Fruits: Apples. Apricots. Avocado. Berries. Bananas. Cherries. Dates. Figs. Grapes. Lemons. Melon. Oranges. Peaches. Plums. Pomegranate.  Meats and other protein foods: Beans. Almonds. Sunflower seeds. Pine nuts. Peanuts. [place]. Salmon. Scallops. Shrimp. [place]. Tilapia. Clams. Oysters. Eggs.  Dairy: Low-fat milk. Cheese. Greek yogurt.  Beverages: Water. Red wine. Herbal tea.  Fats and oils: Extra virgin olive oil. Avocado oil. Grape seed oil.  Sweets and desserts: [place] yogurt with honey. Baked apples. Poached pears. Trail mix.  Seasoning and other  foods: Basil. Cilantro. Coriander. Cumin. Mint. Parsley. Sage. Rosemary. Tarragon. Garlic. Oregano. Thyme. Pepper. Balsalmic vinegar. Tahini. Hummus. Tomato sauce. Olives. Mushrooms. ? Limit these  Grains: Prepackaged pasta or rice dishes. Prepackaged cereal with added sugar.  Vegetables: Deep fried potatoes (french fries).  Fruits: Fruit canned in syrup.  Meats and other protein foods: Beef. Pork. Lamb. Poultry with skin. Hot dogs. Tomasa Blase.  Dairy: Ice cream. Sour cream. Whole milk.  Beverages: Juice. Sugar-sweetened soft drinks. Beer. Liquor and spirits.  Fats and oils: Butter. Canola oil. Vegetable oil. Beef fat (tallow). Lard.  Sweets and desserts: Cookies. Cakes. Pies. Candy.  Seasoning and  other foods: Mayonnaise. Premade sauces and marinades. The items listed may not be a complete list. Talk with your dietitian about what dietary choices are right for you. Summary  The Mediterranean diet includes both food and lifestyle choices.  Eat a variety of fresh fruits and vegetables, beans, nuts, seeds, and whole grains.  Limit the amount of red meat and sweets that you eat.  Talk with your health care provider about whether it is safe for you to drink red wine in moderation. This means 1 glass a day for nonpregnant women and 2 glasses a day for men. A glass of wine equals 5 oz (150 mL). This information is not intended to replace advice given to you by your health care provider. Make sure you discuss any questions you have with your health care provider. Document Revised: 06/21/2016 Document Reviewed: 06/14/2016 Elsevier Patient Education  2020 [employer].

## 2020-12-26 LAB — CYTOLOGY - PAP
Comment: NEGATIVE
Diagnosis: NEGATIVE
High risk HPV: NEGATIVE

## 2020-12-29 ENCOUNTER — Inpatient Hospital Stay: Payer: BC Managed Care – PPO

## 2020-12-29 MED ORDER — CYANOCOBALAMIN 1000 MCG/ML IJ SOLN: 1000.0000 ug | Freq: Once | INTRAMUSCULAR | Status: DC

## 2021-01-02 ENCOUNTER — Other Ambulatory Visit: Payer: Self-pay

## 2021-01-02 ENCOUNTER — Inpatient Hospital Stay: Payer: BC Managed Care – PPO | Attending: Hematology and Oncology

## 2021-01-02 DIAGNOSIS — Z9884 Bariatric surgery status: Secondary | ICD-10-CM | POA: Diagnosis not present

## 2021-01-02 DIAGNOSIS — E538 Deficiency of other specified B group vitamins: Secondary | ICD-10-CM | POA: Insufficient documentation

## 2021-01-02 DIAGNOSIS — K909 Intestinal malabsorption, unspecified: Secondary | ICD-10-CM

## 2021-01-02 DIAGNOSIS — D508 Other iron deficiency anemias: Secondary | ICD-10-CM | POA: Insufficient documentation

## 2021-01-02 MED ORDER — CYANOCOBALAMIN 1000 MCG/ML IJ SOLN
1000.0000 ug | Freq: Once | INTRAMUSCULAR | Status: AC
  Administered 2021-01-02: 1000 ug via INTRAMUSCULAR
  Filled 2021-01-02: qty 1

## 2021-01-12 ENCOUNTER — Other Ambulatory Visit: Payer: Self-pay | Admitting: Gastroenterology

## 2021-01-12 DIAGNOSIS — K219 Gastro-esophageal reflux disease without esophagitis: Secondary | ICD-10-CM

## 2021-01-16 NOTE — Progress Notes (Signed)
Name: Savannah Manning   MRN: [id number]    DOB: June 18, 1961   Date:01/18/2021       Progress Note  Subjective  Chief Complaint  Follow up   HPI   Dysthymia/insomnia: she is still under stress at work, still upset about her weight. She has been taking Seroquel earlier in the pm and the grogginess in am has improved. She tried duloxetine but unable to tolerate it. She denies suicidal thoughts or ideation   GERD: taking Omeprazole and under the care of Dr. Allegra Lai now .   Obesity/prediabetes:. She has been obese since childhood. She has tried multiple diets in the past, she hadgastric bypass in 2005. Weight before surgery was 314 lbs, she went down to 164 lbs, but has been gradually gaining weight. She states her weight has gone up since she started to work from [employer].[facility],and was denied for a revision in theSummer 2018. She has failed Metformin,started her on Ozempic06/2018 herweight was259 lbs, her weightwent as low as239 lbs. Her insurance has denied Ozempic, only paying for patients that have DM. She states is not covering Saxenda again, we gave rx of Contrave Dec 2021 but she stopped due to cost. Discussed [employer] plan   Chronic knee / left hip pain/ left rotator cuff tendinitis: she has seen Dr. Joice Lofts in the past , last visit was for knee pain, reviewed X-ray done 04/2020 that showed bilateral knee OA.  she used to take Meloxicam but off medication because of history of bariatric surgery, but has been using topical nsaid's voltaren gel. Pain on left hip is getting worse - affecting her sleep. She does not want steroid injections, taking Tylenol . We added Duloxetine on her last visit but she had side effects, felt tired and dizzy and stopped it. Discussed trying Lyrica   History of iron deficiency anemia and B12 deficiency: she is under the care of Dr. Merlene Pulling and is getting monthly B12. Last iron storage was normal, ferritin at 47 in January  2022   HTN: She denies chest pain or palpitation. She is on Maxzide and norvasc , bp is at goal today, she states did not take medication this am yet, usually takes it after breakfast, no side effects of medications   Vitamin D deficiency: continue rx vitamin D.   Dysuria: some discomfort when she voided this am, no hematuria, fever, chills. We will recheck urine culture first and treat if positive   Dumping syndrome: she states doing well lately   Seasonal allergic rhinitis : she needs refills of loratadine and nasal steroid. She has noticed nasal congestion   Patient Active Problem List   Diagnosis Date Noted  . Mild intermittent asthma without complication 10/20/2020  . Dysthymia 10/20/2020  . Primary osteoarthritis of both knees 10/20/2020  . Primary osteoarthritis of right knee 04/06/2020  . Primary osteoarthritis of left knee 04/06/2020  . Pre-diabetes 12/05/2018  . Malabsorption of iron 11/07/2018  . Chronic pain of right knee 07/26/2017  . Vitamin D deficiency 04/24/2017  . B12 deficiency 04/24/2017  . Abnormal mammogram of right breast 01/01/2017  . Asthma, mild persistent 12/17/2016  . History of shoulder surgery 10/12/2016  . Primary osteoarthritis of left shoulder 06/14/2016  . Incomplete tear of left rotator cuff 05/30/2016  . Rotator cuff tendinitis 05/30/2016  . History of bariatric surgery 05/04/2016  . Allergic rhinitis 05/12/2015  . Benign essential HTN 05/12/2015  . Anxiety and depression 05/12/2015  . Insomnia, persistent 05/12/2015  .  Dyslipidemia 05/12/2015  . Edema extremities 05/12/2015  . Gastroesophageal reflux disease without esophagitis 05/12/2015  . Hypoglycemia 05/12/2015  . Eczema intertrigo 05/12/2015  . Migraine 05/12/2015  . Plantar fasciitis 05/12/2015  . Umbilical hernia without obstruction and without gangrene 05/12/2015  . Morbid obesity, unspecified obesity type (HCC) 02/13/2008    Past Surgical History:  Procedure Laterality  Date  . ABDOMINAL HYSTERECTOMY  2012  . CHOLECYSTECTOMY    . COLONOSCOPY WITH PROPOFOL N/A 12/18/2019   Procedure: COLONOSCOPY WITH PROPOFOL;  Surgeon: Toney Reil, MD;  Location: [facility];  Service: Gastroenterology;  Laterality: N/A;  . DILATION AND CURETTAGE OF UTERUS    . ESOPHAGOGASTRODUODENOSCOPY (EGD) WITH PROPOFOL N/A 03/18/2020   Procedure: ESOPHAGOGASTRODUODENOSCOPY (EGD) WITH PROPOFOL;  Surgeon: Toney Reil, MD;  Location: [facility];  Service: Gastroenterology;  Laterality: N/A;  . GASTRIC BYPASS  2005  . INDUCED ABORTION N/A 1997   patient was about 22 weeks  . POLYPECTOMY  12/18/2019   Procedure: POLYPECTOMY;  Surgeon: Toney Reil, MD;  Location[facility];  Service: Gastroenterology;;  . SHOULDER ARTHROSCOPY WITH OPEN ROTATOR CUFF REPAIR Left 06/14/2016   Procedure: SHOULDER ARTHROSCOPY WITH OPEN ROTATOR CUFF REPAIR, DECOMPRESSION, EXCISION LOOSE BODY, DEBRIDEMENT;  Surgeon: Christena Flake, MD;  Location: [facility] ORS;  Service: Orthopedics;  Laterality: Left;    Family History  Problem Relation Age of Onset  . Hypertension Mother   . Cancer Father        prostate  . Heart disease Brother   . Breast cancer Cousin 40       mat cousin    Social History   Tobacco Use  . Smoking status: Never Smoker  . Smokeless tobacco: Never Used  Substance Use Topics  . Alcohol use: No    Alcohol/week: 0.0 standard drinks     Current Outpatient Medications:  .  amLODipine (NORVASC) 2.5 MG tablet, Take 1 tablet (2.5 mg total) by mouth daily., Disp: 90 tablet, Rfl: 1 .  loratadine (CLARITIN) 10 MG tablet, Take 1 tablet (10 mg total) by mouth daily., Disp: 90 tablet, Rfl: 1 .  nystatin cream (MYCOSTATIN), Apply 1 application topically 2 (two) times daily. (Patient taking differently: Apply 1 application topically as needed.), Disp: 30 g, Rfl: 5 .  omeprazole (PRILOSEC) 40 MG capsule, TAKE 1 CAPSULE(40 MG) BY MOUTH TWICE DAILY BEFORE MEALS,  Disp: 180 capsule, Rfl: 0 .  pregabalin (LYRICA) 25 MG capsule, Take 1 capsule (25 mg total) by mouth 3 (three) times daily. Start at night and go up as tolerated, Disp: 90 capsule, Rfl: 0 .  rosuvastatin (CRESTOR) 10 MG tablet, Take 1 tablet (10 mg total) by mouth daily., Disp: 90 tablet, Rfl: 1 .  triamterene-hydrochlorothiazide (MAXZIDE-25) 37.5-25 MG tablet, Take 1 tablet by mouth daily. In place of HCTZ 25 , BP medication, Disp: 90 tablet, Rfl: 1 .  Vitamin D, Ergocalciferol, (DRISDOL) 1.25 MG (50000 UNIT) CAPS capsule, Take 1 capsule (50,000 Units total) by mouth every 7 (seven) days., Disp: 12 capsule, Rfl: 1 .  albuterol (VENTOLIN HFA) 108 (90 Base) MCG/ACT inhaler, Inhale 2 puffs into the lungs every 6 (six) hours as needed for wheezing or shortness of breath., Disp: 18 g, Rfl: 0 .  fluticasone (FLONASE) 50 MCG/ACT nasal spray, Place 2 sprays into both nostrils daily., Disp: 16 g, Rfl: 0 .  QUEtiapine (SEROQUEL) 25 MG tablet, Take 1 tablet (25 mg total) by mouth at bedtime. In place of trazodone for sleep, Disp: 90 tablet,  Rfl: 0  Allergies  Allergen Reactions  . Lisinopril Swelling    Facial Swelling  . Ace Inhibitors     angioedema  . Codeine Swelling  . Valsartan Swelling    I personally reviewed active problem list, medication list, allergies, family history, social history, health maintenance with the patient/caregiver today.   ROS  Constitutional: Negative for fever or weight change.  Respiratory: Negative for cough and shortness of breath.   Cardiovascular: Negative for chest pain or palpitations.  Gastrointestinal: Negative for abdominal pain, no bowel changes.  Musculoskeletal: Negative for gait problem or joint swelling.  Skin: Negative for rash.  Neurological: Negative for dizziness or headache.  No other specific complaints in a complete review of systems (except as listed in HPI above).  Objective  Vitals:   01/18/21 0745  BP: 136/88  Pulse: 76  Resp: 16   Temp: 98.1 F (36.7 C)  TempSrc: Oral  SpO2: 99%  Weight: 260 lb (117.9 kg)  Height: 5\' 7"  (1.702 m)    Body mass index is 40.72 kg/m.  Physical Exam  Constitutional: Patient appears well-developed and well-nourished. Obese  No distress.  HEENT: head atraumatic, normocephalic, pupils equal and reactive to light,neck supple Cardiovascular: Normal rate, regular rhythm and normal heart sounds.  No murmur heard. No BLE edema. Pulmonary/Chest: Effort normal and breath sounds normal. No respiratory distress. Abdominal: Soft.  There is no tenderness. Psychiatric: Patient has a normal mood and affect. behavior is normal. Judgment and thought content normal.  Recent Results (from the past 2160 hour(s))  B12 and Folate Panel     Status: None   Collection Time: 10/20/20 11:49 AM  Result Value Ref Range   Vitamin B-12 560 200 - 1,100 pg/mL   Folate 9.6 ng/mL    Comment:                            Reference Range                            Low:           <3.4                            Borderline:    3.4-5.4                            Normal:        >5.4 .   Lipid panel     Status: None   Collection Time: 10/20/20 11:49 AM  Result Value Ref Range   Cholesterol 161 <200 mg/dL   HDL 62 > OR = 50 mg/dL   Triglycerides 161131 <096<150 mg/dL   LDL Cholesterol (Calc) 78 mg/dL (calc)    Comment: Reference range: <100 . Desirable range <100 mg/dL for primary prevention;   <70 mg/dL for patients with CHD or diabetic patients  with > or = 2 CHD risk factors. [name]. LDL-C is now calculated using the Martin-Hopkins  calculation, which is a validated novel method providing  better accuracy than the Friedewald equation in the  estimation of LDL-C.  Horald PollenMartin SS et al. Lenox AhrJAMA. 0454;098(112013;310(19): 2061-2068  (http://education.[facility].com/faq/FAQ164)    Total CHOL/HDL Ratio 2.6 <5.0 (calc)   Non-HDL Cholesterol (Calc) 99 <914<130 mg/dL (calc)    Comment: For patients with diabetes plus 1 major ASCVD risk  factor, treating to a non-HDL-C goal of <100 mg/dL  (LDL-C of <10 mg/dL) is considered a therapeutic  option.   COMPLETE METABOLIC PANEL WITH GFR     Status: None   Collection Time: 10/20/20 11:49 AM  Result Value Ref Range   Glucose, Bld 87 65 - 99 mg/dL    Comment: .            Fasting reference interval .    BUN 14 7 - 25 mg/dL   Creat 2.58 5.27 - 7.82 mg/dL    Comment: For patients >68 years of age, the reference limit for Creatinine is approximately 13% higher for people identified as African-American. .    GFR, Est Non African American 91 > OR = 60 mL/min/1.8m2   GFR, Est African American 106 > OR = 60 mL/min/1.78m2   BUN/Creatinine Ratio NOT APPLICABLE 6 - 22 (calc)   Sodium 140 135 - 146 mmol/L   Potassium 4.2 3.5 - 5.3 mmol/L   Chloride 104 98 - 110 mmol/L   CO2 31 20 - 32 mmol/L   Calcium 9.2 8.6 - 10.4 mg/dL   Total Protein 6.1 6.1 - 8.1 g/dL   Albumin 3.9 3.6 - 5.1 g/dL   Globulin 2.2 1.9 - 3.7 g/dL (calc)   AG Ratio 1.8 1.0 - 2.5 (calc)   Total Bilirubin 0.4 0.2 - 1.2 mg/dL   Alkaline phosphatase (APISO) 92 37 - 153 U/L   AST 12 10 - 35 U/L   ALT 9 6 - 29 U/L  Hemoglobin A1c     Status: Abnormal   Collection Time: 10/20/20 11:49 AM  Result Value Ref Range   Hgb A1c MFr Bld 5.9 (H) <5.7 % of total Hgb    Comment: For someone without known diabetes, a hemoglobin  A1c value between 5.7% and 6.4% is consistent with prediabetes and should be confirmed with a  follow-up test. . For someone with known diabetes, a value <7% indicates that their diabetes is well controlled. A1c targets should be individualized based on duration of diabetes, age, comorbid conditions, and other considerations. . This assay result is consistent with an increased risk of diabetes. . Currently, no consensus exists regarding use of hemoglobin A1c for diagnosis of diabetes for children. .    Mean Plasma Glucose 123 mg/dL   eAG (mmol/L) 6.8 mmol/L  VITAMIN D 25 Hydroxy (Vit-D  Deficiency, Fractures)     Status: None   Collection Time: 10/20/20 11:49 AM  Result Value Ref Range   Vit D, 25-Hydroxy 41 30 - 100 ng/mL    Comment: Vitamin D Status         25-OH Vitamin D: . Deficiency:                    <20 ng/mL Insufficiency:             20 - 29 ng/mL Optimal:                 > or = 30 ng/mL . For 25-OH Vitamin D testing on patients on  D2-supplementation and patients for whom quantitation  of D2 and D3 fractions is required, the [facility](TM) 25-OH VIT D, (D2,D3), LC/MS/MS is recommended: order  code 42353 (patients >92yrs). See Note 1 . Note 1 . For additional information, please refer to  http://education.[facility].com/faq/FAQ199  (This link is being provided for informational/ educational purposes only.)   CULTURE, URINE COMPREHENSIVE     Status: Abnormal   Collection Time: 10/25/20  8:18 AM   Specimen: Urine  Result Value Ref Range   MICRO NUMBER: [id number]    SPECIMEN QUALITY: Adequate    Source OTHER (SPECIFY)    STATUS: FINAL    ISOLATE 1: Escherichia coli (A)     Comment: 50,000-100,000 CFU/mL of Escherichia coli      Susceptibility   Escherichia coli - CULT, URN, SPECIAL NEGATIVE 1    AMOX/CLAVULANIC <=2 Sensitive     AMPICILLIN <=2 Sensitive     AMPICILLIN/SULBACTAM <=2 Sensitive     CEFAZOLIN* <=4 Not Reportable      * For infections other than uncomplicated UTIcaused by E. coli, K. pneumoniae or P. mirabilis:Cefazolin is resistant if MIC > or = 8 mcg/mL.(Distinguishing susceptible versus intermediatefor isolates with MIC < or = 4 mcg/mL requiresadditional testing.)For uncomplicated UTI caused by E. coli,K. pneumoniae or P. mirabilis: Cefazolin issusceptible if MIC <32 mcg/mL and predictssusceptible to the oral agents cefaclor, cefdinir,cefpodoxime, cefprozil, cefuroxime, cephalexinand loracarbef.    CEFEPIME <=1 Sensitive     CEFTRIAXONE <=1 Sensitive     CIPROFLOXACIN >=4 Resistant     LEVOFLOXACIN >=8 Resistant     ERTAPENEM  <=0.5 Sensitive     GENTAMICIN <=1 Sensitive     IMIPENEM <=0.25 Sensitive     NITROFURANTOIN <=16 Sensitive     PIP/TAZO <=4 Sensitive     TOBRAMYCIN <=1 Sensitive     TRIMETH/SULFA* <=20 Sensitive      * For infections other than uncomplicated UTIcaused by E. coli, K. pneumoniae or P. mirabilis:Cefazolin is resistant if MIC > or = 8 mcg/mL.(Distinguishing susceptible versus intermediatefor isolates with MIC < or = 4 mcg/mL requiresadditional testing.)For uncomplicated UTI caused by E. coli,K. pneumoniae or P. mirabilis: Cefazolin issusceptible if MIC <32 mcg/mL and predictssusceptible to the oral agents cefaclor, cefdinir,cefpodoxime, cefprozil, cefuroxime, cephalexinand loracarbef.Legend:S = Susceptible  I = IntermediateR = Resistant  NS = Not susceptible* = Not tested  NR = Not reported**NN = See antimicrobic comments  Iron and TIBC     Status: None   Collection Time: 11/29/20  2:17 PM  Result Value Ref Range   Iron 73 28 - 170 ug/dL   TIBC 308 657 - 846 ug/dL   Saturation Ratios 20 10.4 - 31.8 %   UIBC 299 ug/dL    Comment: Performed at [facility], [address] [place], [place] [address]  Ferritin     Status: None   Collection Time: 11/29/20  2:17 PM  Result Value Ref Range   Ferritin 47 11 - 307 ng/mL    Comment: Performed at [facility], [address] [place], [place] [address]  CBC with Differential     Status: None   Collection Time: 11/29/20  2:17 PM  Result Value Ref Range   WBC 6.7 4.0 - 10.5 K/uL   RBC 4.08 3.87 - 5.11 MIL/uL   Hemoglobin 12.3 12.0 - 15.0 g/dL   HCT 24.4 01.0 - 27.2 %   MCV 89.0 80.0 - 100.0 fL   MCH 30.1 26.0 - 34.0 pg   MCHC 33.9 30.0 - 36.0 g/dL   RDW 53.6 64.4 - 03.4 %   Platelets 242 150 - 400 K/uL   nRBC 0.0 0.0 - 0.2 %   Neutrophils Relative % 66 %   Neutro Abs 4.4 1.7 - 7.7 K/uL   Lymphocytes Relative 26 %   Lymphs Abs 1.8 0.7 - 4.0 K/uL   Monocytes Relative 7 %   Monocytes Absolute 0.5 0.1 - 1.0 K/uL  Eosinophils Relative 1 %   Eosinophils Absolute 0.1 0.0 - 0.5 K/uL   Basophils Relative 0 %   Basophils Absolute 0.0 0.0 - 0.1 K/uL   Immature Granulocytes 0 %   Abs Immature Granulocytes 0.01 0.00 - 0.07 K/uL    Comment: Performed at [facility], [address] [place], [place] [address]  Cytology - PAP     Status: None   Collection Time: 12/21/20  9:26 AM  Result Value Ref Range   High risk HPV Negative    Adequacy Satisfactory for evaluation.    Diagnosis      - Negative for intraepithelial lesion or malignancy (NILM)   Comment Normal Reference Range HPV - Negative      PHQ2/9: Depression screen [facility] 2/9 01/18/2021 10/25/2020 10/20/2020 09/12/2020 09/12/2020  Decreased Interest 0 Down, Depressed, Hopeless 0 PHQ - 2 Score 0 Altered sleeping - -  Tired, decreased energy - -  Change in appetite - -  Feeling bad or failure about yourself  - 0 3 1 -  Trouble concentrating - -  Moving slowly or fidgety/restless - 0 0 0 -  Suicidal thoughts - 0 0 0 -  PHQ-9 Score - -  Difficult doing work/chores - - - Not difficult at all -  Some recent data might be hidden    phq 9 is negative   Fall Risk: Fall Risk  01/18/2021 10/25/2020 10/20/2020 09/12/2020 07/20/2020  Falls in the past year? 0 0 0 0 0  Number falls in past yr: 0 0 0 0 0  Injury with Fall? 0 0 0 0 0  Follow up - - - - -     Functional Status Survey: Is the patient deaf or have difficulty hearing?: No Does the patient have difficulty seeing, even when wearing glasses/contacts?: Yes Does the patient have difficulty concentrating, remembering, or making decisions?: Yes Does the patient have difficulty walking or climbing stairs?: Yes Does the patient have difficulty dressing or bathing?: No Does the patient have difficulty doing errands alone such as visiting a doctor's office or shopping?: No   Assessment & Plan  1. Morbid obesity with BMI of  40.0-44.9, adult [facility])  Discussed with the patient the risk posed by an increased BMI. Discussed importance of portion control, calorie counting and at least 150 minutes of physical activity weekly. Avoid sweet beverages and drink more water. Eat at least 6 servings of fruit and vegetables daily   2. Insomnia due to other mental disorder  - QUEtiapine (SEROQUEL) 25 MG tablet; Take 1 tablet (25 mg total) by mouth at bedtime. In place of trazodone for sleep  Dispense: 90 tablet; Refill: 0  3. Mild intermittent asthma without complication  - albuterol (VENTOLIN HFA) 108 (90 Base) MCG/ACT inhaler; Inhale 2 puffs into the lungs every 6 (six) hours as needed for wheezing or shortness of breath.  Dispense: 18 g; Refill: 0  4. B12 deficiency   5. Dyslipidemia   6. GERD without esophagitis   7. Dysthymia   8. Primary osteoarthritis of both knees  - pregabalin (LYRICA) 25 MG capsule; Take 1 capsule (25 mg total) by mouth 3 (three) times daily. Start at night and go up as tolerated  Dispense: 90 capsule; Refill: 0  9. Greater trochanteric bursitis of left hip  - pregabalin (LYRICA)  25 MG capsule; Take 1 capsule (25 mg total) by mouth 3 (three) times daily. Start at night and go up as tolerated  Dispense: 90 capsule; Refill: 0  10. Seasonal allergies  - fluticasone (FLONASE) 50 MCG/ACT nasal spray; Place 2 sprays into both nostrils daily.  Dispense: 16 g; Refill: 0 - loratadine (CLARITIN) 10 MG tablet; Take 1 tablet (10 mg total) by mouth daily.  Dispense: 90 tablet; Refill: 1  11. Dysuria  - CULTURE, URINE COMPREHENSIVE  12. History of bariatric surgery   13. History of iron deficiency   14. Benign essential HTN  At goal   15. Vitamin D deficiency

## 2021-01-18 ENCOUNTER — Ambulatory Visit (INDEPENDENT_AMBULATORY_CARE_PROVIDER_SITE_OTHER): Payer: BC Managed Care – PPO | Admitting: Family Medicine

## 2021-01-18 ENCOUNTER — Other Ambulatory Visit: Payer: Self-pay | Admitting: Family Medicine

## 2021-01-18 ENCOUNTER — Other Ambulatory Visit: Payer: Self-pay

## 2021-01-18 ENCOUNTER — Encounter: Payer: Self-pay | Admitting: Family Medicine

## 2021-01-18 VITALS — BP 136/88 | HR 76 | Temp 98.1°F | Resp 16 | Ht 67.0 in | Wt 260.0 lb

## 2021-01-18 DIAGNOSIS — K219 Gastro-esophageal reflux disease without esophagitis: Secondary | ICD-10-CM

## 2021-01-18 DIAGNOSIS — I10 Essential (primary) hypertension: Secondary | ICD-10-CM

## 2021-01-18 DIAGNOSIS — F5105 Insomnia due to other mental disorder: Secondary | ICD-10-CM

## 2021-01-18 DIAGNOSIS — M170 Bilateral primary osteoarthritis of knee: Secondary | ICD-10-CM

## 2021-01-18 DIAGNOSIS — Z8639 Personal history of other endocrine, nutritional and metabolic disease: Secondary | ICD-10-CM

## 2021-01-18 DIAGNOSIS — R300 Dysuria: Secondary | ICD-10-CM

## 2021-01-18 DIAGNOSIS — J4520 Mild intermittent asthma, uncomplicated: Secondary | ICD-10-CM | POA: Diagnosis not present

## 2021-01-18 DIAGNOSIS — E538 Deficiency of other specified B group vitamins: Secondary | ICD-10-CM

## 2021-01-18 DIAGNOSIS — E785 Hyperlipidemia, unspecified: Secondary | ICD-10-CM

## 2021-01-18 DIAGNOSIS — J302 Other seasonal allergic rhinitis: Secondary | ICD-10-CM

## 2021-01-18 DIAGNOSIS — Z9884 Bariatric surgery status: Secondary | ICD-10-CM

## 2021-01-18 DIAGNOSIS — F341 Dysthymic disorder: Secondary | ICD-10-CM

## 2021-01-18 DIAGNOSIS — E559 Vitamin D deficiency, unspecified: Secondary | ICD-10-CM

## 2021-01-18 DIAGNOSIS — M7062 Trochanteric bursitis, left hip: Secondary | ICD-10-CM

## 2021-01-18 DIAGNOSIS — F99 Mental disorder, not otherwise specified: Secondary | ICD-10-CM

## 2021-01-18 DIAGNOSIS — Z6841 Body Mass Index (BMI) 40.0 and over, adult: Secondary | ICD-10-CM

## 2021-01-18 MED ORDER — LORATADINE 10 MG PO TABS
10.0000 mg | ORAL_TABLET | Freq: Every day | ORAL | 1 refills | Status: AC
Start: 2021-01-18 — End: ?

## 2021-01-18 MED ORDER — FLUTICASONE PROPIONATE 50 MCG/ACT NA SUSP: 2.0000 | Freq: Every day | NASAL | 0 refills | Status: DC

## 2021-01-18 MED ORDER — ALBUTEROL SULFATE HFA 108 (90 BASE) MCG/ACT IN AERS
2.0000 | INHALATION_SPRAY | Freq: Four times a day (QID) | RESPIRATORY_TRACT | 0 refills | Status: DC | PRN
Start: 2021-01-18 — End: 2022-04-03

## 2021-01-18 MED ORDER — QUETIAPINE FUMARATE 25 MG PO TABS: 25.0000 mg | ORAL_TABLET | Freq: Every day | ORAL | 0 refills | Status: DC

## 2021-01-18 MED ORDER — PREGABALIN 25 MG PO CAPS: 25.0000 mg | ORAL_CAPSULE | Freq: Three times a day (TID) | ORAL | 0 refills | Status: DC

## 2021-01-18 NOTE — Patient Instructions (Signed)
[employer] diet plan

## 2021-01-18 NOTE — Telephone Encounter (Signed)
   Notes to clinic: requesting a 90 day supply   Requested Prescriptions  Pending Prescriptions Disp Refills   fluticasone (FLONASE) 50 MCG/ACT nasal spray [Pharmacy Med Name: FLUTICASONE NASAL SP (120) RX] 48 g     Sig: SHAKE LIQUID AND USE 2 SPRAYS IN EACH NOSTRIL DAILY      Ear, Nose, and Throat: Nasal Preparations - Corticosteroids Passed - 01/18/2021  8:17 AM      Passed - Valid encounter within last 12 months    Recent Outpatient Visits           Today Morbid obesity with BMI of 40.0-44.9, adult [facility])   [facility] Alba Cory, MD   2 months ago Dysuria   [facility] Alba Cory, MD   3 months ago Benign essential HTN   [facility], Danna Hefty, MD   4 months ago Benign essential HTN   [facility] [place], Danna Hefty, MD   6 months ago Morbid obesity with BMI of 40.0-44.9, adult [facility])   [facility] Alba Cory, MD

## 2021-01-20 LAB — CULTURE, URINE COMPREHENSIVE
MICRO NUMBER:: 11658149
SPECIMEN QUALITY:: ADEQUATE

## 2021-01-21 ENCOUNTER — Encounter: Payer: Self-pay | Admitting: Family Medicine

## 2021-01-24 ENCOUNTER — Other Ambulatory Visit: Payer: Self-pay | Admitting: Family Medicine

## 2021-01-24 DIAGNOSIS — N3090 Cystitis, unspecified without hematuria: Secondary | ICD-10-CM

## 2021-01-24 MED ORDER — CIPROFLOXACIN HCL 250 MG PO TABS: 250.0000 mg | ORAL_TABLET | Freq: Two times a day (BID) | ORAL | 0 refills | Status: DC

## 2021-01-24 NOTE — Telephone Encounter (Signed)
Copied from CRM [id number]. Topic: General - Other >> Jan 23, 2021  3:09 PM Savannah Manning A wrote: Reason for CRM: Patient would like to be contacted by staff regarding lab results (01/18/21)  Patient would also like to know the steps to take following receiving their results  Patient declined to make an additional appointment regarding this matter  Please contact to advise further    Spoke with patient on this morning and she would like to be treated for the urinary tract infection and to please send medication into [facility].  Patient also wanted to know based on her urine culture results could the positive for Gram positive cocci isolatedbe a result of dirty instruments?

## 2021-01-26 ENCOUNTER — Inpatient Hospital Stay: Payer: BC Managed Care – PPO | Attending: Hematology and Oncology

## 2021-01-26 ENCOUNTER — Other Ambulatory Visit: Payer: Self-pay

## 2021-01-26 DIAGNOSIS — E538 Deficiency of other specified B group vitamins: Secondary | ICD-10-CM | POA: Insufficient documentation

## 2021-01-26 DIAGNOSIS — D508 Other iron deficiency anemias: Secondary | ICD-10-CM | POA: Insufficient documentation

## 2021-01-26 DIAGNOSIS — Z9884 Bariatric surgery status: Secondary | ICD-10-CM | POA: Diagnosis not present

## 2021-01-26 DIAGNOSIS — K909 Intestinal malabsorption, unspecified: Secondary | ICD-10-CM

## 2021-01-26 MED ORDER — CYANOCOBALAMIN 1000 MCG/ML IJ SOLN
1000.0000 ug | Freq: Once | INTRAMUSCULAR | Status: AC
  Administered 2021-01-26: 1000 ug via INTRAMUSCULAR
  Filled 2021-01-26: qty 1

## 2021-02-28 ENCOUNTER — Inpatient Hospital Stay: Payer: BC Managed Care – PPO | Attending: Oncology

## 2021-02-28 ENCOUNTER — Inpatient Hospital Stay: Payer: BC Managed Care – PPO

## 2021-02-28 ENCOUNTER — Other Ambulatory Visit: Payer: Self-pay

## 2021-02-28 DIAGNOSIS — K909 Intestinal malabsorption, unspecified: Secondary | ICD-10-CM

## 2021-02-28 DIAGNOSIS — D509 Iron deficiency anemia, unspecified: Secondary | ICD-10-CM | POA: Diagnosis not present

## 2021-02-28 DIAGNOSIS — E538 Deficiency of other specified B group vitamins: Secondary | ICD-10-CM | POA: Diagnosis not present

## 2021-02-28 DIAGNOSIS — K9589 Other complications of other bariatric procedure: Secondary | ICD-10-CM

## 2021-02-28 DIAGNOSIS — D508 Other iron deficiency anemias: Secondary | ICD-10-CM

## 2021-02-28 LAB — CBC
HCT: 38.9 % (ref 36.0–46.0)
Hemoglobin: 13 g/dL (ref 12.0–15.0)
MCH: 29.6 pg (ref 26.0–34.0)
MCHC: 33.4 g/dL (ref 30.0–36.0)
MCV: 88.6 fL (ref 80.0–100.0)
Platelets: 248 10*3/uL (ref 150–400)
RBC: 4.39 MIL/uL (ref 3.87–5.11)
RDW: 12.9 % (ref 11.5–15.5)
WBC: 6.2 10*3/uL (ref 4.0–10.5)
nRBC: 0 % (ref 0.0–0.2)

## 2021-02-28 LAB — FERRITIN: Ferritin: 32 ng/mL (ref 11–307)

## 2021-02-28 MED ORDER — CYANOCOBALAMIN 1000 MCG/ML IJ SOLN
1000.0000 ug | Freq: Once | INTRAMUSCULAR | Status: AC
  Administered 2021-02-28: 1000 ug via INTRAMUSCULAR

## 2021-03-02 ENCOUNTER — Inpatient Hospital Stay: Payer: BC Managed Care – PPO

## 2021-03-30 ENCOUNTER — Inpatient Hospital Stay: Payer: BC Managed Care – PPO | Attending: Hematology and Oncology

## 2021-03-30 ENCOUNTER — Other Ambulatory Visit: Payer: Self-pay

## 2021-03-30 DIAGNOSIS — K909 Intestinal malabsorption, unspecified: Secondary | ICD-10-CM

## 2021-03-30 DIAGNOSIS — E538 Deficiency of other specified B group vitamins: Secondary | ICD-10-CM | POA: Insufficient documentation

## 2021-03-30 MED ORDER — CYANOCOBALAMIN 1000 MCG/ML IJ SOLN
1000.0000 ug | Freq: Once | INTRAMUSCULAR | Status: AC
  Administered 2021-03-30: 1000 ug via INTRAMUSCULAR

## 2021-04-27 ENCOUNTER — Inpatient Hospital Stay: Payer: BC Managed Care – PPO | Attending: Hematology and Oncology

## 2021-04-27 ENCOUNTER — Other Ambulatory Visit: Payer: Self-pay

## 2021-04-27 DIAGNOSIS — E538 Deficiency of other specified B group vitamins: Secondary | ICD-10-CM | POA: Insufficient documentation

## 2021-04-27 DIAGNOSIS — K909 Intestinal malabsorption, unspecified: Secondary | ICD-10-CM

## 2021-04-27 MED ORDER — CYANOCOBALAMIN 1000 MCG/ML IJ SOLN
1000.0000 ug | Freq: Once | INTRAMUSCULAR | Status: AC
  Administered 2021-04-27: 1000 ug via INTRAMUSCULAR

## 2021-05-02 NOTE — Progress Notes (Signed)
Name: Savannah Manning   MRN: [id number]    DOB: February 26, 1961   Date:05/03/2021       Progress Note  Subjective  Chief Complaint  Follow Up  HPI  Dysthymia/insomnia: she is still under stress at work - recently changed positions at work,  still upset about her weight. She has been taking Seroquel prn only now, sleeping better.  She tried duloxetine but unable to tolerate it. She denies suicidal thoughts or ideation    GERD: taking Omeprazole and under the care of Dr. Allegra Lai now  Taking PPI BID , no recent episodes of heart burn or indigestion    Obesity/prediabetes: .  She has been obese since childhood. She has tried multiple diets in the past, she had gastric bypass in 2005. Weight before surgery was 314 lbs, she went down to 164 lbs, but has been gradually gaining weight. She states her weight has gone up since she started to work from home in  2010.  She had her surgery done at [facility], and was denied for a revision in the Summer 2018.  She has failed Metformin, started her on Ozempic  04/2017 her weight was 259 lbs, her weight went as low as 239 lbs. Her insurance has denied Ozempic, only paying for patients that have DM. She states is not covering Saxenda again, we gave rx of Contrave Dec 2021 but she stopped due to cost. She gained more weight since last visit , today it is up from 260 lbs to 268 lbs. Discussed Reginal Lutes and she would love to try it    Chronic knee / left hip pain/ left rotator cuff tendinitis: she has seen Dr. Joice Lofts in the past , last visit was for knee pain, reviewed X-ray done 04/2020 that showed bilateral knee OA.  she used to take Meloxicam but off medication because of history of bariatric surgery, but has been using topical nsaid's voltaren gel. Pain on left hip is getting worse - affecting her sleep. She does not want steroid injections, taking Tylenol . We added Duloxetine on her last visit but she had side effects, felt tired and dizzy and stopped it. Discussed trying Lyrica She  states today she work up with a pain on left lower back, aggravated by movement, no rash, no fever, woke up with the pain. Explained importance of regular physical activity, consider chiropractor pain    History of iron deficiency anemia and B12 deficiency: she is under the care of Dr. Merlene Pulling and is getting monthly B12. Last iron storage was normal, ferritin at mid 30's , still seeing hematologist    HTN:  She denies chest pain or palpitation. She is on Maxzide and norvasc , bp is at goal today continue current regiment  Vitamin D deficiency: continue rx vitamin D. Last level was at goal   Dumping syndrome: she states doing well lately - unchanged    Asthma: had a small flare this past weekend, but usually well controlled, she uses medication prn   Patient Active Problem List   Diagnosis Date Noted   Mild intermittent asthma without complication 10/20/2020   Dysthymia 10/20/2020   Primary osteoarthritis of both knees 10/20/2020   Primary osteoarthritis of right knee 04/06/2020   Primary osteoarthritis of left knee 04/06/2020   Pre-diabetes 12/05/2018   Malabsorption of iron 11/07/2018   Chronic pain of right knee 07/26/2017   Vitamin D deficiency 04/24/2017   B12 deficiency 04/24/2017   Abnormal mammogram of right breast 01/01/2017   Asthma, mild  persistent 12/17/2016   History of shoulder surgery 10/12/2016   Primary osteoarthritis of left shoulder 06/14/2016   Incomplete tear of left rotator cuff 05/30/2016   Rotator cuff tendinitis 05/30/2016   History of bariatric surgery 05/04/2016   Allergic rhinitis 05/12/2015   Benign essential HTN 05/12/2015   Anxiety and depression 05/12/2015   Insomnia, persistent 05/12/2015   Dyslipidemia 05/12/2015   Edema extremities 05/12/2015   Gastroesophageal reflux disease without esophagitis 05/12/2015   Hypoglycemia 05/12/2015   Eczema intertrigo 05/12/2015   Migraine 05/12/2015   Plantar fasciitis 05/12/2015   Umbilical hernia without  obstruction and without gangrene 05/12/2015   Morbid obesity, unspecified obesity type (HCC) 02/13/2008    Past Surgical History:  Procedure Laterality Date   ABDOMINAL HYSTERECTOMY  2012   CHOLECYSTECTOMY     COLONOSCOPY WITH PROPOFOL N/A 12/18/2019   Procedure: COLONOSCOPY WITH PROPOFOL;  Surgeon: Toney Reil, MD;  Location: [facility];  Service: Gastroenterology;  Laterality: N/A;   DILATION AND CURETTAGE OF UTERUS     ESOPHAGOGASTRODUODENOSCOPY (EGD) WITH PROPOFOL N/A 03/18/2020   Procedure: ESOPHAGOGASTRODUODENOSCOPY (EGD) WITH PROPOFOL;  Surgeon: Toney Reil, MD;  Location: [facility];  Service: Gastroenterology;  Laterality: N/A;   GASTRIC BYPASS  2005   INDUCED ABORTION N/A 1997   patient was about 22 weeks   POLYPECTOMY  12/18/2019   Procedure: POLYPECTOMY;  Surgeon: Toney Reil, MD;  Location: [facility];  Service: Gastroenterology;;   SHOULDER ARTHROSCOPY WITH OPEN ROTATOR CUFF REPAIR Left 06/14/2016   Procedure: SHOULDER ARTHROSCOPY WITH OPEN ROTATOR CUFF REPAIR, DECOMPRESSION, EXCISION LOOSE BODY, DEBRIDEMENT;  Surgeon: Christena Flake, MD;  Location: [facility] OR[facility];  Service: Orthopedics;  Laterality: Left;    Family History  Problem Relation Age of Onset   Hypertension Mother    Cancer Father        prostate   Heart disease Brother    Breast cancer Cousin 40       mat cousin    Social History   Tobacco Use   Smoking status: Never   Smokeless tobacco: Never  Substance Use Topics   Alcohol use: No    Alcohol/week: 0.0 standard drinks     Current Outpatient Medications:    albuterol (VENTOLIN HFA) 108 (90 Base) MCG/ACT inhaler, Inhale 2 puffs into the lungs every 6 (six) hours as needed for wheezing or shortness of breath., Disp: 18 g, Rfl: 0   amLODipine (NORVASC) 2.5 MG tablet, Take 1 tablet (2.5 mg total) by mouth daily., Disp: 90 tablet, Rfl: 1   ciprofloxacin (CIPRO) 250 MG tablet, Take 1 tablet (250 mg total) by mouth 2  (two) times daily., Disp: 6 tablet, Rfl: 0   fluticasone (FLONASE) 50 MCG/ACT nasal spray, SHAKE LIQUID AND USE 2 SPRAYS IN EACH NOSTRIL DAILY, Disp: 48 g, Rfl: 0   loratadine (CLARITIN) 10 MG tablet, Take 1 tablet (10 mg total) by mouth daily., Disp: 90 tablet, Rfl: 1   nystatin cream (MYCOSTATIN), Apply 1 application topically 2 (two) times daily. (Patient taking differently: Apply 1 application topically as needed.), Disp: 30 g, Rfl: 5   omeprazole (PRILOSEC) 40 MG capsule, TAKE 1 CAPSULE(40 MG) BY MOUTH TWICE DAILY BEFORE MEALS, Disp: 180 capsule, Rfl: 0   pregabalin (LYRICA) 25 MG capsule, Take 1 capsule (25 mg total) by mouth 3 (three) times daily. Start at night and go up as tolerated, Disp: 90 capsule, Rfl: 0   QUEtiapine (SEROQUEL) 25 MG tablet, Take 1 tablet (25 mg total) by  mouth at bedtime. In place of trazodone for sleep, Disp: 90 tablet, Rfl: 0   rosuvastatin (CRESTOR) 10 MG tablet, Take 1 tablet (10 mg total) by mouth daily., Disp: 90 tablet, Rfl: 1   triamterene-hydrochlorothiazide (MAXZIDE-25) 37.5-25 MG tablet, Take 1 tablet by mouth daily. In place of HCTZ 25 , BP medication, Disp: 90 tablet, Rfl: 1   Vitamin D, Ergocalciferol, (DRISDOL) 1.25 MG (50000 UNIT) CAPS capsule, Take 1 capsule (50,000 Units total) by mouth every 7 (seven) days., Disp: 12 capsule, Rfl: 1  Allergies  Allergen Reactions   Lisinopril Swelling    Facial Swelling   Ace Inhibitors     angioedema   Codeine Swelling   Valsartan Swelling    I personally reviewed active problem list, medication list, allergies, family history, social history, health maintenance with the patient/caregiver today.   ROS  Constitutional: Negative for fever , positive for weight change.  Respiratory: Negative for cough and shortness of breath.   Cardiovascular: Negative for chest pain or palpitations.  Gastrointestinal: Negative for abdominal pain, no bowel changes.  Musculoskeletal: positive  for gait problem but no  joint  swelling.  Skin: Negative for rash.  Neurological: Negative for dizziness or headache.  No other specific complaints in a complete review of systems (except as listed in HPI above).   Objective  Vitals:   05/03/21 0746  BP: 134/86  Pulse: 75  Resp: 16  Temp: 98.2 F (36.8 C)  TempSrc: Oral  SpO2: 97%  Weight: 268 lb (121.6 kg)  Height: 5\' 7"  (1.702 m)    Body mass index is 41.97 kg/m.  Physical Exam  Constitutional: Patient appears well-developed and well-nourished. Obese  No distress.  HEENT: head atraumatic, normocephalic, pupils equal and reactive to light, neck supple Cardiovascular: Normal rate, regular rhythm and normal heart sounds.  No murmur heard. No BLE edema. Pulmonary/Chest: Effort normal and breath sounds normal. No respiratory distress. Abdominal: Soft.  There is no tenderness. Psychiatric: Patient has a normal mood and affect. behavior is normal. Judgment and thought content normal.   Recent Results (from the past 2160 hour(s))  Ferritin     Status: None   Collection Time: 02/28/21  2:17 PM  Result Value Ref Range   Ferritin 32 11 - 307 ng/mL    Comment: Performed at [facility], [address] [place], [place]  CBC     Status: None   Collection Time: 02/28/21  2:17 PM  Result Value Ref Range   WBC 6.2 4.0 - 10.5 K/uL   RBC 4.39 3.87 - 5.11 MIL/uL   Hemoglobin 13.0 12.0 - 15.0 g/dL   HCT 03/02/21 70.3 - 50.0 %   MCV 88.6 80.0 - 100.0 fL   MCH 29.6 26.0 - 34.0 pg   MCHC 33.4 30.0 - 36.0 g/dL   RDW 93.8 18.2 - 99.3 %   Platelets 248 150 - 400 K/uL   nRBC 0.0 0.0 - 0.2 %    Comment: Performed at [facility], [address] [place], [place]     PHQ2/9: Depression screen [facility] 2/9 05/03/2021 01/18/2021 10/25/2020 10/20/2020 09/12/2020  Decreased Interest 1 0 1 1 1   Down, Depressed, Hopeless 1 0 1 1 1   PHQ - 2 Score 2 0 2 2 2   Altered sleeping 1 - 3 3 1   Tired, decreased energy 1 - 2 3 1   Change in appetite  0 - 2 2 1   Feeling bad or failure about yourself  0 - 0 3 1  Trouble concentrating 1 - 2 3 1   Moving slowly or fidgety/restless 0 - 0 0 0  Suicidal thoughts 0 - 0 0 0  PHQ-9 Score 5 - 11 16 7   Difficult doing work/chores - - - - Not difficult at all  Some recent data might be hidden    phq 9 is positive   Fall Risk: Fall Risk  05/03/2021 01/18/2021 10/25/2020 10/20/2020 09/12/2020  Falls in the past year? 0 0 0 0 0  Number falls in past yr: 0 0 0 0 0  Injury with Fall? 0 0 0 0 0  Follow up - - - - -     Functional Status Survey: Is the patient deaf or have difficulty hearing?: No Does the patient have difficulty seeing, even when wearing glasses/contacts?: No Does the patient have difficulty concentrating, remembering, or making decisions?: Yes Does the patient have difficulty walking or climbing stairs?: Yes Does the patient have difficulty dressing or bathing?: No Does the patient have difficulty doing errands alone such as visiting a doctor's office or shopping?: No    Assessment & Plan  1. Benign essential HTN  - amLODipine (NORVASC) 2.5 MG tablet; Take 1 tablet (2.5 mg total) by mouth daily.  Dispense: 90 tablet; Refill: 1 - triamterene-hydrochlorothiazide (MAXZIDE-25) 37.5-25 MG tablet; Take 1 tablet by mouth daily. In place of HCTZ 25 , BP medication  Dispense: 90 tablet; Refill: 1  2. Dyslipidemia  - rosuvastatin (CRESTOR) 10 MG tablet; Take 1 tablet (10 mg total) by mouth daily.  Dispense: 90 tablet; Refill: 1  3. Vitamin D deficiency  - Vitamin D, Ergocalciferol, (DRISDOL) 1.25 MG (50000 UNIT) CAPS capsule; Take 1 capsule (50,000 Units total) by mouth every 7 (seven) days.  Dispense: 12 capsule; Refill: 1  4. Primary osteoarthritis of both knees  - pregabalin (LYRICA) 25 MG capsule; Take 1 capsule (25 mg total) by mouth 3 (three) times daily.  Dispense: 90 capsule; Refill: 0  5. Greater trochanteric bursitis of left hip  - pregabalin (LYRICA) 25 MG capsule;  Take 1 capsule (25 mg total) by mouth 3 (three) times daily.  Dispense: 90 capsule; Refill: 0  6. B12 deficiency   7. GERD without esophagitis   8. Morbid obesity with BMI of 40.0-44.9, adult (HCC)  - Semaglutide-Weight Management 0.25 MG/0.5ML SOAJ; Inject 0.25 mg into the skin once a week for 28 days.  Dispense: 2 mL; Refill: 0 - Semaglutide-Weight Management 0.5 MG/0.5ML SOAJ; Inject 0.5 mg into the skin once a week for 28 days.  Dispense: 2 mL; Refill: 0 - Semaglutide-Weight Management 1.7 MG/0.75ML SOAJ; Inject 1.7 mg into the skin once a week for 28 days.  Dispense: 3 mL; Refill: 0 - Semaglutide-Weight Management 2.4 MG/0.75ML SOAJ; Inject 2.4 mg into the skin once a week for 28 days.  Dispense: 3 mL; Refill: 0  9. History of iron deficiency   10. History of bariatric surgery   11. Mild intermittent asthma without complication   12. Insomnia due to other mental disorder   13. Chronic pain of right knee   14. Acute left-sided low back pain without sciatica

## 2021-05-03 ENCOUNTER — Ambulatory Visit (INDEPENDENT_AMBULATORY_CARE_PROVIDER_SITE_OTHER): Payer: BC Managed Care – PPO | Admitting: Family Medicine

## 2021-05-03 ENCOUNTER — Encounter: Payer: Self-pay | Admitting: Family Medicine

## 2021-05-03 ENCOUNTER — Other Ambulatory Visit: Payer: Self-pay

## 2021-05-03 VITALS — BP 134/86 | HR 75 | Temp 98.2°F | Resp 16 | Ht 67.0 in | Wt 268.0 lb

## 2021-05-03 DIAGNOSIS — F5105 Insomnia due to other mental disorder: Secondary | ICD-10-CM

## 2021-05-03 DIAGNOSIS — E559 Vitamin D deficiency, unspecified: Secondary | ICD-10-CM

## 2021-05-03 DIAGNOSIS — M170 Bilateral primary osteoarthritis of knee: Secondary | ICD-10-CM

## 2021-05-03 DIAGNOSIS — E538 Deficiency of other specified B group vitamins: Secondary | ICD-10-CM

## 2021-05-03 DIAGNOSIS — I10 Essential (primary) hypertension: Secondary | ICD-10-CM | POA: Diagnosis not present

## 2021-05-03 DIAGNOSIS — J4520 Mild intermittent asthma, uncomplicated: Secondary | ICD-10-CM

## 2021-05-03 DIAGNOSIS — E785 Hyperlipidemia, unspecified: Secondary | ICD-10-CM | POA: Diagnosis not present

## 2021-05-03 DIAGNOSIS — M545 Low back pain, unspecified: Secondary | ICD-10-CM

## 2021-05-03 DIAGNOSIS — M7062 Trochanteric bursitis, left hip: Secondary | ICD-10-CM

## 2021-05-03 DIAGNOSIS — F99 Mental disorder, not otherwise specified: Secondary | ICD-10-CM

## 2021-05-03 DIAGNOSIS — M25561 Pain in right knee: Secondary | ICD-10-CM

## 2021-05-03 DIAGNOSIS — G8929 Other chronic pain: Secondary | ICD-10-CM

## 2021-05-03 DIAGNOSIS — Z9884 Bariatric surgery status: Secondary | ICD-10-CM

## 2021-05-03 DIAGNOSIS — K219 Gastro-esophageal reflux disease without esophagitis: Secondary | ICD-10-CM

## 2021-05-03 DIAGNOSIS — Z6841 Body Mass Index (BMI) 40.0 and over, adult: Secondary | ICD-10-CM

## 2021-05-03 DIAGNOSIS — Z8639 Personal history of other endocrine, nutritional and metabolic disease: Secondary | ICD-10-CM

## 2021-05-03 MED ORDER — PREGABALIN 25 MG PO CAPS: 25.0000 mg | ORAL_CAPSULE | Freq: Three times a day (TID) | ORAL | 0 refills | Status: DC

## 2021-05-03 MED ORDER — ROSUVASTATIN CALCIUM 10 MG PO TABS: 10.0000 mg | ORAL_TABLET | Freq: Every day | ORAL | 1 refills | Status: DC

## 2021-05-03 MED ORDER — SEMAGLUTIDE-WEIGHT MANAGEMENT 2.4 MG/0.75ML ~~LOC~~ SOAJ: 2.4000 mg | SUBCUTANEOUS | 0 refills | Status: DC

## 2021-05-03 MED ORDER — SEMAGLUTIDE-WEIGHT MANAGEMENT 0.25 MG/0.5ML ~~LOC~~ SOAJ: 0.2500 mg | SUBCUTANEOUS | 0 refills | Status: AC

## 2021-05-03 MED ORDER — VITAMIN D (ERGOCALCIFEROL) 1.25 MG (50000 UNIT) PO CAPS: 50000.0000 [IU] | ORAL_CAPSULE | ORAL | 1 refills | Status: DC

## 2021-05-03 MED ORDER — AMLODIPINE BESYLATE 2.5 MG PO TABS: 2.5000 mg | ORAL_TABLET | Freq: Every day | ORAL | 1 refills | Status: DC

## 2021-05-03 MED ORDER — TRIAMTERENE-HCTZ 37.5-25 MG PO TABS: 1.0000 | ORAL_TABLET | Freq: Every day | ORAL | 1 refills | Status: DC

## 2021-05-03 MED ORDER — SEMAGLUTIDE-WEIGHT MANAGEMENT 0.5 MG/0.5ML ~~LOC~~ SOAJ: 0.5000 mg | SUBCUTANEOUS | 0 refills | Status: AC

## 2021-05-03 MED ORDER — SEMAGLUTIDE-WEIGHT MANAGEMENT 1.7 MG/0.75ML ~~LOC~~ SOAJ: 1.7000 mg | SUBCUTANEOUS | 0 refills | Status: DC

## 2021-05-04 ENCOUNTER — Encounter: Payer: Self-pay | Admitting: Family Medicine

## 2021-05-24 ENCOUNTER — Telehealth: Payer: Self-pay

## 2021-05-24 NOTE — Telephone Encounter (Signed)
We do not have a copy of FMLA paperwork scanned in. Spoke with pt and explained this. She verbalized she will contact [employer] to have the paperwork re-faxed for [place] to fill out.

## 2021-05-24 NOTE — Telephone Encounter (Signed)
Copied from CRM [id number]. Topic: General - Other >> May 24, 2021 10:20 AM Gaetana Michaelis A wrote: Reason for CRM: Patient has called for an update on their FMLA paperwork   Patient shares that they submitted their paperwork roughly four months ago  Patient has contacted [employer] and been directed to resume contact with their PCP  Please contact further

## 2021-05-30 ENCOUNTER — Inpatient Hospital Stay: Payer: BC Managed Care – PPO | Attending: Oncology

## 2021-05-30 ENCOUNTER — Other Ambulatory Visit: Payer: Self-pay

## 2021-05-30 DIAGNOSIS — K909 Intestinal malabsorption, unspecified: Secondary | ICD-10-CM

## 2021-05-30 DIAGNOSIS — Z79899 Other long term (current) drug therapy: Secondary | ICD-10-CM | POA: Diagnosis not present

## 2021-05-30 DIAGNOSIS — J45909 Unspecified asthma, uncomplicated: Secondary | ICD-10-CM | POA: Insufficient documentation

## 2021-05-30 DIAGNOSIS — K9589 Other complications of other bariatric procedure: Secondary | ICD-10-CM

## 2021-05-30 DIAGNOSIS — Z9884 Bariatric surgery status: Secondary | ICD-10-CM | POA: Insufficient documentation

## 2021-05-30 DIAGNOSIS — I10 Essential (primary) hypertension: Secondary | ICD-10-CM | POA: Insufficient documentation

## 2021-05-30 DIAGNOSIS — D509 Iron deficiency anemia, unspecified: Secondary | ICD-10-CM | POA: Insufficient documentation

## 2021-05-30 DIAGNOSIS — E538 Deficiency of other specified B group vitamins: Secondary | ICD-10-CM | POA: Diagnosis not present

## 2021-05-30 LAB — IRON AND TIBC
Iron: 105 ug/dL (ref 28–170)
Saturation Ratios: 25 % (ref 10.4–31.8)
TIBC: 427 ug/dL (ref 250–450)
UIBC: 322 ug/dL

## 2021-05-30 LAB — CBC WITH DIFFERENTIAL/PLATELET
Abs Immature Granulocytes: 0.02 10*3/uL (ref 0.00–0.07)
Basophils Absolute: 0 10*3/uL (ref 0.0–0.1)
Basophils Relative: 0 %
Eosinophils Absolute: 0.1 10*3/uL (ref 0.0–0.5)
Eosinophils Relative: 2 %
HCT: 37.8 % (ref 36.0–46.0)
Hemoglobin: 13.1 g/dL (ref 12.0–15.0)
Immature Granulocytes: 0 %
Lymphocytes Relative: 32 %
Lymphs Abs: 1.7 10*3/uL (ref 0.7–4.0)
MCH: 30.2 pg (ref 26.0–34.0)
MCHC: 34.7 g/dL (ref 30.0–36.0)
MCV: 87.1 fL (ref 80.0–100.0)
Monocytes Absolute: 0.4 10*3/uL (ref 0.1–1.0)
Monocytes Relative: 8 %
Neutro Abs: 3 10*3/uL (ref 1.7–7.7)
Neutrophils Relative %: 58 %
Platelets: 250 10*3/uL (ref 150–400)
RBC: 4.34 MIL/uL (ref 3.87–5.11)
RDW: 13.2 % (ref 11.5–15.5)
WBC: 5.3 10*3/uL (ref 4.0–10.5)
nRBC: 0 % (ref 0.0–0.2)

## 2021-05-30 LAB — FERRITIN: Ferritin: 28 ng/mL (ref 11–307)

## 2021-05-31 ENCOUNTER — Inpatient Hospital Stay (HOSPITAL_BASED_OUTPATIENT_CLINIC_OR_DEPARTMENT_OTHER): Payer: BC Managed Care – PPO | Admitting: Oncology

## 2021-05-31 ENCOUNTER — Telehealth: Payer: Self-pay | Admitting: *Deleted

## 2021-05-31 ENCOUNTER — Encounter: Payer: Self-pay | Admitting: Oncology

## 2021-05-31 ENCOUNTER — Inpatient Hospital Stay: Payer: BC Managed Care – PPO

## 2021-05-31 ENCOUNTER — Other Ambulatory Visit: Payer: Self-pay

## 2021-05-31 VITALS — BP 150/89 | HR 44 | Temp 97.5°F | Resp 18 | Wt 265.4 lb

## 2021-05-31 DIAGNOSIS — D509 Iron deficiency anemia, unspecified: Secondary | ICD-10-CM | POA: Diagnosis not present

## 2021-05-31 DIAGNOSIS — E538 Deficiency of other specified B group vitamins: Secondary | ICD-10-CM

## 2021-05-31 DIAGNOSIS — J45909 Unspecified asthma, uncomplicated: Secondary | ICD-10-CM | POA: Diagnosis not present

## 2021-05-31 DIAGNOSIS — K909 Intestinal malabsorption, unspecified: Secondary | ICD-10-CM

## 2021-05-31 DIAGNOSIS — I10 Essential (primary) hypertension: Secondary | ICD-10-CM | POA: Diagnosis not present

## 2021-05-31 DIAGNOSIS — Z9884 Bariatric surgery status: Secondary | ICD-10-CM | POA: Diagnosis not present

## 2021-05-31 DIAGNOSIS — Z79899 Other long term (current) drug therapy: Secondary | ICD-10-CM | POA: Diagnosis not present

## 2021-05-31 MED ORDER — CYANOCOBALAMIN 1000 MCG/ML IJ SOLN
1000.0000 ug | Freq: Once | INTRAMUSCULAR | Status: AC
  Administered 2021-05-31: 1000 ug via INTRAMUSCULAR

## 2021-05-31 NOTE — Telephone Encounter (Signed)
Patient called reporting that she forgot to mention that she got her 4th COVID vaccine today and also go there B 12 injection this morning and is asking if there will be a prooblem getting both on same day

## 2021-05-31 NOTE — Telephone Encounter (Signed)
No issues with that

## 2021-05-31 NOTE — Progress Notes (Signed)
Hematology/Oncology Consult note Operating Room Services  [phone] [phone]  Patient Care Team: Alba Cory, MD as PCP - General (Family Medicine) Tresea Mall, CNM as Midwife (Obstetrics) Rosey Bath, MD as Referring Physician (Hematology and Oncology)   Name of the patient: Savannah Manning  [id number]  11-Sep-1961   Date of visit: 05/31/21  Diagnosis-history of gastric bypass and iron and B12 deficiency anemia  Chief complaint/ Reason for visit-routine follow-up of iron and B12 deficiency anemia  Heme/Onc history: Patient is a 60 year old female with history of gastric bypass surgery in 2005 and subsequent iron and B12 deficiency anemia.  Hysterectomy in 2012.  Last colonoscopy in February 2021.  EGD in May 2021 which showed Roux-en-Y gastrojejunostomy with GJ anastomosis characterized by healthy-appearing mucosa.  Interval history- Patient is doing well overall.  She has chronic fatigue and she is concerned about her recent weight gain.  She is unable to exercise because of knee pain  ECOG PS- 0 Pain scale- 0  Review of systems- Review of Systems  Constitutional:  Positive for malaise/fatigue. Negative for chills, fever and weight loss.       Patient is concerned about her weight gain  HENT:  Negative for congestion, ear discharge and nosebleeds.   Eyes:  Negative for blurred vision.  Respiratory:  Negative for cough, hemoptysis, sputum production, shortness of breath and wheezing.   Cardiovascular:  Negative for chest pain, palpitations, orthopnea and claudication.  Gastrointestinal:  Negative for abdominal pain, blood in stool, constipation, diarrhea, heartburn, melena, nausea and vomiting.  Genitourinary:  Negative for dysuria, flank pain, frequency, hematuria and urgency.  Musculoskeletal:  Positive for joint pain. Negative for back pain and myalgias.  Skin:  Negative for rash.  Neurological:  Negative for dizziness,  tingling, focal weakness, seizures, weakness and headaches.  Endo/Heme/Allergies:  Does not bruise/bleed easily.  Psychiatric/Behavioral:  Negative for depression and suicidal ideas. The patient does not have insomnia.      Allergies  Allergen Reactions   Lisinopril Swelling    Facial Swelling   Ace Inhibitors     angioedema   Codeine Swelling   Valsartan Swelling     Past Medical History:  Diagnosis Date   Anemia    Arthritis    Asthma    GERD (gastroesophageal reflux disease)    Headache    Hypertension    Iron deficiency anemia 12/05/2018   Wears contact lenses      Past Surgical History:  Procedure Laterality Date   ABDOMINAL HYSTERECTOMY  2012   CHOLECYSTECTOMY     COLONOSCOPY WITH PROPOFOL N/A 12/18/2019   Procedure: COLONOSCOPY WITH PROPOFOL;  Surgeon: Toney Reil, MD;  Location: [facility];  Service: Gastroenterology;  Laterality: N/A;   DILATION AND CURETTAGE OF UTERUS     ESOPHAGOGASTRODUODENOSCOPY (EGD) WITH PROPOFOL N/A 03/18/2020   Procedure: ESOPHAGOGASTRODUODENOSCOPY (EGD) WITH PROPOFOL;  Surgeon: Toney Reil, MD;  Location: [facility];  Service: Gastroenterology;  Laterality: N/A;   GASTRIC BYPASS  2005   INDUCED ABORTION N/A 1997   patient was about 22 weeks   POLYPECTOMY  12/18/2019   Procedure: POLYPECTOMY;  Surgeon: Toney Reil, MD;  Location: [facility];  Service: Gastroenterology;;   SHOULDER ARTHROSCOPY WITH OPEN ROTATOR CUFF REPAIR Left 06/14/2016   Procedure: SHOULDER ARTHROSCOPY WITH OPEN ROTATOR CUFF REPAIR, DECOMPRESSION, EXCISION LOOSE BODY, DEBRIDEMENT;  Surgeon: Christena Flake, MD;  Location: [facility];  Service: Orthopedics;  Laterality: Left;    Social History  Socioeconomic History   Marital status: Single    Spouse name: Not on file   Number of children: 1   Years of education: Not on file   Highest education level: Not on file  Occupational History   Not on file  Tobacco Use   Smoking  status: Never   Smokeless tobacco: Never  Vaping Use   Vaping Use: Never used  Substance and Sexual Activity   Alcohol use: No    Alcohol/week: 0.0 standard drinks   Drug use: No   Sexual activity: Not Currently  Other Topics Concern   Not on file  Social History Narrative   Desk job/works for insurance; no smoking; no alcohol; lives in [place].    Social Determinants of Health   Financial Resource Strain: Not on file  Food Insecurity: Not on file  Transportation Needs: Not on file  Physical Activity: Not on file  Stress: Not on file  Social Connections: Not on file  Intimate Partner Violence: Not on file    Family History  Problem Relation Age of Onset   Hypertension Mother    Cancer Father        prostate   Heart disease Brother    Breast cancer Cousin 40       mat cousin     Current Outpatient Medications:    albuterol (VENTOLIN HFA) 108 (90 Base) MCG/ACT inhaler, Inhale 2 puffs into the lungs every 6 (six) hours as needed for wheezing or shortness of breath., Disp: 18 g, Rfl: 0   amLODipine (NORVASC) 2.5 MG tablet, Take 1 tablet (2.5 mg total) by mouth daily., Disp: 90 tablet, Rfl: 1   fluticasone (FLONASE) 50 MCG/ACT nasal spray, SHAKE LIQUID AND USE 2 SPRAYS IN EACH NOSTRIL DAILY, Disp: 48 g, Rfl: 0   loratadine (CLARITIN) 10 MG tablet, Take 1 tablet (10 mg total) by mouth daily., Disp: 90 tablet, Rfl: 1   nystatin cream (MYCOSTATIN), Apply 1 application topically 2 (two) times daily. (Patient taking differently: Apply 1 application topically as needed.), Disp: 30 g, Rfl: 5   omeprazole (PRILOSEC) 40 MG capsule, TAKE 1 CAPSULE(40 MG) BY MOUTH TWICE DAILY BEFORE MEALS, Disp: 180 capsule, Rfl: 0   pregabalin (LYRICA) 25 MG capsule, Take 1 capsule (25 mg total) by mouth 3 (three) times daily., Disp: 90 capsule, Rfl: 0   QUEtiapine (SEROQUEL) 25 MG tablet, Take 1 tablet (25 mg total) by mouth at bedtime. In place of trazodone for sleep, Disp: 90 tablet, Rfl: 0    rosuvastatin (CRESTOR) 10 MG tablet, Take 1 tablet (10 mg total) by mouth daily., Disp: 90 tablet, Rfl: 1   Semaglutide-Weight Management 0.25 MG/0.5ML SOAJ, Inject 0.25 mg into the skin once a week for 28 days., Disp: 2 mL, Rfl: 0   [START ON 06/01/2021] Semaglutide-Weight Management 0.5 MG/0.5ML SOAJ, Inject 0.5 mg into the skin once a week for 28 days., Disp: 2 mL, Rfl: 0   [START ON 07/29/2021] Semaglutide-Weight Management 1.7 MG/0.75ML SOAJ, Inject 1.7 mg into the skin once a week for 28 days., Disp: 3 mL, Rfl: 0   [START ON 08/27/2021] Semaglutide-Weight Management 2.4 MG/0.75ML SOAJ, Inject 2.4 mg into the skin once a week for 28 days., Disp: 3 mL, Rfl: 0   triamterene-hydrochlorothiazide (MAXZIDE-25) 37.5-25 MG tablet, Take 1 tablet by mouth daily. In place of HCTZ 25 , BP medication, Disp: 90 tablet, Rfl: 1   Vitamin D, Ergocalciferol, (DRISDOL) 1.25 MG (50000 UNIT) CAPS capsule, Take 1 capsule (50,000 Units total) by  mouth every 7 (seven) days., Disp: 12 capsule, Rfl: 1  Physical exam: There were no vitals filed for this visit. Physical Exam Constitutional:      General: She is not in acute distress. Cardiovascular:     Rate and Rhythm: Normal rate and regular rhythm.     Heart sounds: Normal heart sounds.  Pulmonary:     Effort: Pulmonary effort is normal.     Breath sounds: Normal breath sounds.  Abdominal:     General: Bowel sounds are normal.     Palpations: Abdomen is soft.  Skin:    General: Skin is warm and dry.  Neurological:     Mental Status: She is alert and oriented to person, place, and time.     CMP Latest Ref Rng & Units 10/20/2020  Glucose 65 - 99 mg/dL 87  BUN 7 - 25 mg/dL 14  Creatinine 0.08 - 6.76 mg/dL 1.95  Sodium 093 - 267 mmol/L 140  Potassium 3.5 - 5.3 mmol/L 4.2  Chloride 98 - 110 mmol/L 104  CO2 20 - 32 mmol/L 31  Calcium 8.6 - 10.4 mg/dL 9.2  Total Protein 6.1 - 8.1 g/dL 6.1  Total Bilirubin 0.2 - 1.2 mg/dL 0.4  Alkaline Phos 38 - 126 U/L -   AST 10 - 35 U/L 12  ALT 6 - 29 U/L 9   CBC Latest Ref Rng & Units 05/30/2021  WBC 4.0 - 10.5 K/uL 5.3  Hemoglobin 12.0 - 15.0 g/dL 12.4  Hematocrit 58.0 - 46.0 % 37.8  Platelets 150 - 400 K/uL 250     Assessment and plan- Patient is a 60 y.o. female with history of gastric bypass and subsequent iron and B12 deficiency anemia here for routine follow-up  B12 deficiency: She is on monthly B12 shots and will receive her next dose today.  She will continue to receive that monthly  Iron deficiency: She has not received IV iron since January 2020.H&H has remained stable around 13 for the last 3 years.  Ferritin today is less than 38 at 28With an iron saturation of 25% and high normal TIBC of 427.  I will hold off on giving her IV iron at this time but if her ferritin drops again we will proceed with IV iron at that time.  Repeat CBC ferritin and iron studies in 3 months in 6 months and I will see her in 6 months   Visit Diagnosis 1. History of gastric bypass   2. Iron deficiency anemia, unspecified iron deficiency anemia type   3. B12 deficiency      Dr. Owens Shark, MD, MPH [facility] at [facility] [phone] 05/31/2021 10:17 AM

## 2021-05-31 NOTE — Telephone Encounter (Signed)
Patient informed of doctor response 

## 2021-06-01 ENCOUNTER — Inpatient Hospital Stay: Payer: BC Managed Care – PPO

## 2021-06-14 ENCOUNTER — Encounter: Payer: Self-pay | Admitting: Family Medicine

## 2021-06-23 DIAGNOSIS — J0110 Acute frontal sinusitis, unspecified: Secondary | ICD-10-CM | POA: Diagnosis not present

## 2021-06-25 ENCOUNTER — Encounter: Payer: Self-pay | Admitting: Family Medicine

## 2021-06-25 DIAGNOSIS — Z20822 Contact with and (suspected) exposure to covid-19: Secondary | ICD-10-CM | POA: Diagnosis not present

## 2021-06-27 ENCOUNTER — Telehealth (INDEPENDENT_AMBULATORY_CARE_PROVIDER_SITE_OTHER): Payer: BC Managed Care – PPO | Admitting: Family Medicine

## 2021-06-27 ENCOUNTER — Encounter: Payer: Self-pay | Admitting: Family Medicine

## 2021-06-27 DIAGNOSIS — U071 COVID-19: Secondary | ICD-10-CM | POA: Diagnosis not present

## 2021-06-27 NOTE — Progress Notes (Signed)
Name: Savannah Manning   MRN: [id number]    DOB: Apr 25, 1961   Date:06/27/2021       Progress Note  Subjective  Chief Complaint  COVID Positive  I connected with  Savannah Manning  on 06/27/21 at 11:40 AM EDT by a video enabled telemedicine application and verified that I am speaking with the correct person using two identifiers.  I discussed the limitations of evaluation and management by telemedicine and the availability of in person appointments. The patient expressed understanding and agreed to proceed with the virtual visit  Staff also discussed with the patient that there may be a patient responsible charge related to this service. Patient Location: at home  Provider Location: [facility] Additional Individuals present: alone   HPI  COVID-19: symptoms started on 5 days ago with a scratchy throat, she felt worse on Friday, with fatigue, nasal congestion, she did a home COVID-19 test and it was negative She had a virtual visit and was given Augmentin rx for possible sinusitis, however symptoms did not improve, she had severe fatigue, chills , body aches, sob, cough, chest felt like it was on fire. She was feeling on Sunday and decided to recheck for COVID-19 before going to church and test was positive. She is feeling much better today. Appetite is still poor and still has nasal congestion.   She had four doses of COVID-19   Patient Active Problem List   Diagnosis Date Noted   Mild intermittent asthma without complication 10/20/2020   Dysthymia 10/20/2020   Primary osteoarthritis of both knees 10/20/2020   Primary osteoarthritis of right knee 04/06/2020   Primary osteoarthritis of left knee 04/06/2020   Pre-diabetes 12/05/2018   Malabsorption of iron 11/07/2018   Chronic pain of right knee 07/26/2017   Vitamin D deficiency 04/24/2017   B12 deficiency 04/24/2017   Abnormal mammogram of right breast 01/01/2017   Asthma, mild persistent 12/17/2016   History of shoulder surgery 10/12/2016    Primary osteoarthritis of left shoulder 06/14/2016   Incomplete tear of left rotator cuff 05/30/2016   Rotator cuff tendinitis 05/30/2016   History of bariatric surgery 05/04/2016   Allergic rhinitis 05/12/2015   Benign essential HTN 05/12/2015   Anxiety and depression 05/12/2015   Insomnia, persistent 05/12/2015   Dyslipidemia 05/12/2015   Edema extremities 05/12/2015   Gastroesophageal reflux disease without esophagitis 05/12/2015   Hypoglycemia 05/12/2015   Eczema intertrigo 05/12/2015   Migraine 05/12/2015   Plantar fasciitis 05/12/2015   Umbilical hernia without obstruction and without gangrene 05/12/2015   Morbid obesity, unspecified obesity type (HCC) 02/13/2008    Past Surgical History:  Procedure Laterality Date   ABDOMINAL HYSTERECTOMY  2012   CHOLECYSTECTOMY     COLONOSCOPY WITH PROPOFOL N/A 12/18/2019   Procedure: COLONOSCOPY WITH PROPOFOL;  Surgeon: Toney Reil, MD;  Location: [facility];  Service: Gastroenterology;  Laterality: N/A;   DILATION AND CURETTAGE OF UTERUS     ESOPHAGOGASTRODUODENOSCOPY (EGD) WITH PROPOFOL N/A 03/18/2020   Procedure: ESOPHAGOGASTRODUODENOSCOPY (EGD) WITH PROPOFOL;  Surgeon: Toney Reil, MD;  Location: [facility];  Service: Gastroenterology;  Laterality: N/A;   GASTRIC BYPASS  2005   INDUCED ABORTION N/A 1997   patient was about 22 weeks   POLYPECTOMY  12/18/2019   Procedure: POLYPECTOMY;  Surgeon: Toney Reil, MD;  Location: [facility];  Service: Gastroenterology;;   SHOULDER ARTHROSCOPY WITH OPEN ROTATOR CUFF REPAIR Left 06/14/2016   Procedure: SHOULDER ARTHROSCOPY WITH OPEN ROTATOR CUFF REPAIR, DECOMPRESSION, EXCISION LOOSE  BODY, DEBRIDEMENT;  Surgeon: Christena Flake, MD;  Location: [facility];  Service: Orthopedics;  Laterality: Left;    Family History  Problem Relation Age of Onset   Hypertension Mother    Cancer Father        prostate   Heart disease Brother    Breast cancer Cousin 40        mat cousin    Social History   Socioeconomic History   Marital status: Single    Spouse name: Not on file   Number of children: 1   Years of education: Not on file   Highest education level: Not on file  Occupational History   Not on file  Tobacco Use   Smoking status: Never   Smokeless tobacco: Never  Vaping Use   Vaping Use: Never used  Substance and Sexual Activity   Alcohol use: No    Alcohol/week: 0.0 standard drinks   Drug use: No   Sexual activity: Not Currently  Other Topics Concern   Not on file  Social History Narrative   Desk job/works for insurance; no smoking; no alcohol; lives in [place].    Social Determinants of Health   Financial Resource Strain: Not on file  Food Insecurity: Not on file  Transportation Needs: Not on file  Physical Activity: Not on file  Stress: Not on file  Social Connections: Not on file  Intimate Partner Violence: Not on file     Current Outpatient Medications:    acetaminophen (TYLENOL) 325 MG tablet, Take 325 mg by mouth every 6 (six) hours as needed. PRN, Disp: , Rfl:    amLODipine (NORVASC) 2.5 MG tablet, Take 1 tablet (2.5 mg total) by mouth daily., Disp: 90 tablet, Rfl: 1   fluticasone (FLONASE) 50 MCG/ACT nasal spray, SHAKE LIQUID AND USE 2 SPRAYS IN EACH NOSTRIL DAILY, Disp: 48 g, Rfl: 0   loratadine (CLARITIN) 10 MG tablet, Take 1 tablet (10 mg total) by mouth daily., Disp: 90 tablet, Rfl: 1   omeprazole (PRILOSEC) 40 MG capsule, TAKE 1 CAPSULE(40 MG) BY MOUTH TWICE DAILY BEFORE MEALS, Disp: 180 capsule, Rfl: 0   pregabalin (LYRICA) 25 MG capsule, Take 1 capsule (25 mg total) by mouth 3 (three) times daily., Disp: 90 capsule, Rfl: 0   QUEtiapine (SEROQUEL) 25 MG tablet, Take 1 tablet (25 mg total) by mouth at bedtime. In place of trazodone for sleep, Disp: 90 tablet, Rfl: 0   rosuvastatin (CRESTOR) 10 MG tablet, Take 1 tablet (10 mg total) by mouth daily., Disp: 90 tablet, Rfl: 1   Semaglutide-Weight Management 0.5  MG/0.5ML SOAJ, Inject 0.5 mg into the skin once a week for 28 days., Disp: 2 mL, Rfl: 0   [START ON 07/29/2021] Semaglutide-Weight Management 1.7 MG/0.75ML SOAJ, Inject 1.7 mg into the skin once a week for 28 days., Disp: 3 mL, Rfl: 0   [START ON 08/27/2021] Semaglutide-Weight Management 2.4 MG/0.75ML SOAJ, Inject 2.4 mg into the skin once a week for 28 days., Disp: 3 mL, Rfl: 0   triamterene-hydrochlorothiazide (MAXZIDE-25) 37.5-25 MG tablet, Take 1 tablet by mouth daily. In place of HCTZ 25 , BP medication, Disp: 90 tablet, Rfl: 1   Vitamin D, Ergocalciferol, (DRISDOL) 1.25 MG (50000 UNIT) CAPS capsule, Take 1 capsule (50,000 Units total) by mouth every 7 (seven) days., Disp: 12 capsule, Rfl: 1   albuterol (VENTOLIN HFA) 108 (90 Base) MCG/ACT inhaler, Inhale 2 puffs into the lungs every 6 (six) hours as needed for wheezing or shortness of breath. (Patient  not taking: No sig reported), Disp: 18 g, Rfl: 0   nystatin cream (MYCOSTATIN), Apply 1 application topically 2 (two) times daily. (Patient not taking: No sig reported), Disp: 30 g, Rfl: 5  Allergies  Allergen Reactions   Lisinopril Swelling    Facial Swelling   Ace Inhibitors     angioedema   Codeine Swelling   Valsartan Swelling    I personally reviewed active problem list, medication list, allergies, family history, social history, health maintenance with the patient/caregiver today.   ROS  Ten systems reviewed and is negative except as mentioned in HPI   Objective  Virtual encounter, vitals not obtained.  There is no height or weight on file to calculate BMI.  Physical Exam  Awake, alert and oriented   PHQ2/9: Depression screen [facility] 2/9 06/27/2021 05/03/2021 01/18/2021 10/25/2020 10/20/2020  Decreased Interest 0 1 0 1 1  Down, Depressed, Hopeless 0 1 0 1 1  PHQ - 2 Score 0 2 0 2 2  Altered sleeping 0 1 - 3 3  Tired, decreased energy 0 1 - 2 3  Change in appetite 2 0 - 2 2  Feeling bad or failure about yourself  0 0 - 0 3   Trouble concentrating 2 1 - 2 3  Moving slowly or fidgety/restless 0 0 - 0 0  Suicidal thoughts 0 0 - 0 0  PHQ-9 Score 4 5 - 11 16  Difficult doing work/chores - - - - -  Some recent data might be hidden   PHQ-2/9 Result is negative.    Fall Risk: Fall Risk  06/27/2021 05/03/2021 01/18/2021 10/25/2020 10/20/2020  Falls in the past year? 0 0 0 0 0  Number falls in past yr: 0 0 0 0 0  Injury with Fall? 0 0 0 0 0  Risk for fall due to : No Fall Risks - - - -  Follow up Falls prevention discussed - - - -     Assessment & Plan  1. COVID-19  Doing better, advised fluids, rest, otc medication, use Ventolin if needed. Explained Paxlovid not indicated since her symptoms are better Advised tomorrow can go out from quarantine with a mask.   I discussed the assessment and treatment plan with the patient. The patient was provided an opportunity to ask questions and all were answered. The patient agreed with the plan and demonstrated an understanding of the instructions.  The patient was advised to call back or seek an in-person evaluation if the symptoms worsen or if the condition fails to improve as anticipated.  I provided 15  minutes of non-face-to-face time during this encounter.

## 2021-06-30 ENCOUNTER — Inpatient Hospital Stay: Payer: BC Managed Care – PPO

## 2021-07-04 ENCOUNTER — Encounter: Payer: Self-pay | Admitting: Family Medicine

## 2021-07-04 NOTE — Telephone Encounter (Signed)
Spoke with patient via phone to try and get her an appointment for possible bladder infection in person with either Dr. Carlynn Purl or Della Goo, FNP.  Pt stated that she would contact us back once she is able to look at her schedule.

## 2021-07-07 ENCOUNTER — Other Ambulatory Visit: Payer: Self-pay

## 2021-07-07 ENCOUNTER — Ambulatory Visit (INDEPENDENT_AMBULATORY_CARE_PROVIDER_SITE_OTHER): Payer: BC Managed Care – PPO | Admitting: Nurse Practitioner

## 2021-07-07 ENCOUNTER — Other Ambulatory Visit (HOSPITAL_COMMUNITY)
Admission: RE | Admit: 2021-07-07 | Discharge: 2021-07-07 | Disposition: A | Discharge status: Home / Self Care | Payer: BC Managed Care – PPO | Source: Ambulatory Visit | Attending: Family Medicine | Admitting: Family Medicine

## 2021-07-07 VITALS — BP 144/84 | HR 76 | Temp 97.9°F | Resp 16 | Ht 67.0 in | Wt 262.9 lb

## 2021-07-07 DIAGNOSIS — N898 Other specified noninflammatory disorders of vagina: Secondary | ICD-10-CM | POA: Insufficient documentation

## 2021-07-07 DIAGNOSIS — R8290 Unspecified abnormal findings in urine: Secondary | ICD-10-CM

## 2021-07-07 DIAGNOSIS — I10 Essential (primary) hypertension: Secondary | ICD-10-CM | POA: Diagnosis not present

## 2021-07-07 LAB — POCT URINALYSIS DIPSTICK
Blood, UA: NEGATIVE
Glucose, UA: NEGATIVE
Ketones, UA: 5
Leukocytes, UA: NEGATIVE
Nitrite, UA: NEGATIVE
Protein, UA: NEGATIVE
Spec Grav, UA: 1.015 (ref 1.010–1.025)
Urobilinogen, UA: 2 E.U./dL — AB
pH, UA: 6.5 (ref 5.0–8.0)

## 2021-07-07 NOTE — Progress Notes (Addendum)
BP (!) 144/84   Pulse 76   Temp 97.9 F (36.6 C)   Resp 16   Ht 5\' 7"  (1.702 m)   Wt 262 lb 14.4 oz (119.3 kg)   SpO2 98%   BMI 41.18 kg/m    Subjective:    Patient ID: , female    DOB: 07-01-1961, 60 y.o.   MRN: [id number]  HPI: Savannah Manning is a 60 y.o. female  Chief Complaint  Patient presents with   urine odor   Vaginal Itching   Foul smelling urine:  Symptoms started Monday.  Symptoms included foul smelling urine and urinary frequency.  Denies any dysuria, no hematuria.  Denies any abdominal or back pain.  No fever.  She says symptoms resolved Wednesday.  Not currently having symptoms.   Vaginal itching: She noticed on Monday.  No vaginal discharge.  She said no odor noted.  She says the vaginal itching resolved on Wednesday.  Not currently having symptoms.    High blood pressure: did not take her blood pressure medication today.  To day her blood pressure is 144/84. She does not check her blood pressure regularly.    Relevant past medical, surgical, family and social history reviewed and updated as indicated. Interim medical history since our last visit reviewed. Allergies and medications reviewed and updated.  Review of Systems  Constitutional: Negative for fever or weight change.  Respiratory: Negative for cough and shortness of breath.   Cardiovascular: Negative for chest pain or palpitations.  Gastrointestinal: Negative for abdominal pain, no bowel changes.  Genitourinary: positive for urinary frequency, positive for vaginal itching Musculoskeletal: Negative for gait problem or joint swelling.  Skin: Negative for rash.  Neurological: Negative for dizziness or headache.  No other specific complaints in a complete review of systems (except as listed in HPI above).     Objective:    BP (!) 144/84   Pulse 76   Temp 97.9 F (36.6 C)   Resp 16   Ht 5\' 7"  (1.702 m)   Wt 262 lb 14.4 oz (119.3 kg)   SpO2 98%   BMI 41.18 kg/m   Wt Readings  from Last 3 Encounters:  07/07/21 262 lb 14.4 oz (119.3 kg)  05/31/21 265 lb 6.9 oz (120.4 kg)  05/03/21 268 lb (121.6 kg)    Physical Exam  Constitutional: Patient appears well-developed and well-nourished. Obese No distress.  HEENT: head atraumatic, normocephalic, pupils equal and reactive to light, neck supple Cardiovascular: Normal rate, regular rhythm and normal heart sounds.  No murmur heard. No BLE edema. Pulmonary/Chest: Effort normal and breath sounds normal. No respiratory distress. Abdominal: Soft.  There is no tenderness. No CVA tenderness. No suprapubic tenderness.  Psychiatric: Patient has a normal mood and affect. behavior is normal. Judgment and thought content normal.   Results for orders placed or performed in visit on 07/07/21  POCT Urinalysis Dipstick  Result Value Ref Range   Color, UA yellow    Clarity, UA cloudy    Glucose, UA Negative Negative   Bilirubin, UA Small    Ketones, UA 5    Spec Grav, UA 1.015 1.010 - 1.025   Blood, UA Negative    pH, UA 6.5 5.0 - 8.0   Protein, UA Negative Negative   Urobilinogen, UA 2.0 (A) 0.2 or 1.0 E.U./dL   Nitrite, UA Negative    Leukocytes, UA Negative Negative   Appearance     Odor        Assessment &  Plan:   Problem List Items Addressed This Visit   None Visit Diagnoses     Foul smelling urine    -  Primary   She is not having symptoms at this time.  Encouraged increased water intake and proper hygiene to prevent urinary tract infections.     Relevant Orders   POCT Urinalysis Dipstick (Completed)   Cervicovaginal ancillary only   Vaginal itching       Symptoms resolved.     Relevant Orders   POCT Urinalysis Dipstick (Completed)   Cervicovaginal ancillary only        Follow up plan: Return if symptoms worsen or fail to improve.

## 2021-07-11 ENCOUNTER — Other Ambulatory Visit: Payer: Self-pay | Admitting: Nurse Practitioner

## 2021-07-11 DIAGNOSIS — N760 Acute vaginitis: Secondary | ICD-10-CM

## 2021-07-11 DIAGNOSIS — B9689 Other specified bacterial agents as the cause of diseases classified elsewhere: Secondary | ICD-10-CM

## 2021-07-11 LAB — CERVICOVAGINAL ANCILLARY ONLY
Bacterial Vaginitis (gardnerella): POSITIVE — AB
Candida Glabrata: NEGATIVE
Candida Vaginitis: NEGATIVE
Comment: NEGATIVE
Comment: NEGATIVE
Comment: NEGATIVE

## 2021-07-11 MED ORDER — METRONIDAZOLE 500 MG PO TABS: 500.0000 mg | ORAL_TABLET | Freq: Two times a day (BID) | ORAL | 0 refills | Status: AC

## 2021-07-13 ENCOUNTER — Encounter: Payer: Self-pay | Admitting: Family Medicine

## 2021-08-02 ENCOUNTER — Inpatient Hospital Stay: Payer: BC Managed Care – PPO | Attending: Family Medicine

## 2021-08-07 ENCOUNTER — Encounter: Payer: Self-pay | Admitting: Family Medicine

## 2021-08-07 ENCOUNTER — Ambulatory Visit (INDEPENDENT_AMBULATORY_CARE_PROVIDER_SITE_OTHER): Payer: BC Managed Care – PPO | Admitting: Family Medicine

## 2021-08-07 VITALS — BP 136/82 | HR 68 | Temp 98.1°F | Resp 16 | Ht 67.0 in | Wt 267.0 lb

## 2021-08-07 DIAGNOSIS — E785 Hyperlipidemia, unspecified: Secondary | ICD-10-CM

## 2021-08-07 DIAGNOSIS — Z23 Encounter for immunization: Secondary | ICD-10-CM

## 2021-08-07 DIAGNOSIS — E559 Vitamin D deficiency, unspecified: Secondary | ICD-10-CM

## 2021-08-07 DIAGNOSIS — F99 Mental disorder, not otherwise specified: Secondary | ICD-10-CM

## 2021-08-07 DIAGNOSIS — R7303 Prediabetes: Secondary | ICD-10-CM

## 2021-08-07 DIAGNOSIS — E538 Deficiency of other specified B group vitamins: Secondary | ICD-10-CM

## 2021-08-07 DIAGNOSIS — Z6841 Body Mass Index (BMI) 40.0 and over, adult: Secondary | ICD-10-CM

## 2021-08-07 DIAGNOSIS — F5105 Insomnia due to other mental disorder: Secondary | ICD-10-CM

## 2021-08-07 DIAGNOSIS — M170 Bilateral primary osteoarthritis of knee: Secondary | ICD-10-CM

## 2021-08-07 DIAGNOSIS — K219 Gastro-esophageal reflux disease without esophagitis: Secondary | ICD-10-CM

## 2021-08-07 DIAGNOSIS — I10 Essential (primary) hypertension: Secondary | ICD-10-CM

## 2021-08-07 DIAGNOSIS — Z8639 Personal history of other endocrine, nutritional and metabolic disease: Secondary | ICD-10-CM

## 2021-08-07 DIAGNOSIS — Z9884 Bariatric surgery status: Secondary | ICD-10-CM

## 2021-08-07 DIAGNOSIS — R739 Hyperglycemia, unspecified: Secondary | ICD-10-CM

## 2021-08-07 MED ORDER — QUETIAPINE FUMARATE 25 MG PO TABS: 25.0000 mg | ORAL_TABLET | Freq: Every day | ORAL | 0 refills | Status: DC

## 2021-08-07 NOTE — Progress Notes (Signed)
Name: Savannah Manning   MRN: [id number]    DOB: Aug 31, 1961   Date:08/07/2021       Progress Note  Subjective  Chief Complaint  Follow Up  HPI  Dysthymia/insomnia:  She has been taking Seroquel prn only now, sleeping better.  She tried duloxetine but unable to tolerate it. She denies suicidal thoughts or ideation . She could not afford Wegovy and is frustrated about it    GERD: taking Omeprazole and under the care of Dr. Allegra Lai now She still takes medication    Obesity/prediabetes: .  She has been obese since childhood. She has tried multiple diets in the past, she had gastric bypass in 2005. Weight before surgery was 314 lbs, she went down to 164 lbs, but has been gradually gaining weight. She states her weight has gone up since she started to work from home in  2010.  She had her surgery done at [facility], and was denied for a revision in the Summer 2018.  She has failed Metformin, started her on Ozempic  04/2017 her weight was 259 lbs, her weight went as low as 239 lbs. Her insurance has denied Ozempic, only paying for patients that have DM. She states is not covering Saxenda again, we gave rx of Contrave Dec 2021 but she stopped due to cost. She gained more weight since last visit , today weight is stable at 267 lbs. We gave her Reginal Lutes and the sample worked, but the rx was too expensive, discussed Victoza but she does not like daily injectaion. Suggested her joining [employer] again    Chronic knee / left hip pain/ left rotator cuff tendinitis: she has seen Dr. Joice Lofts in the past , last visit was for knee pain, reviewed X-ray done 04/2020 that showed bilateral knee OA.  she used to take Meloxicam but off medication because of history of bariatric surgery, but has been using topical nsaid's voltaren gel. Pain on left hip is getting worse - affecting her sleep. She does not want steroid injections, taking Tylenol . We added Duloxetine but stopped do to side effects. She is now taking Lyrica TID and  seems to be helping a little. No paresthesia. She can try to wean self off and see if pain is also controlled with medication and if no change she can stop, since paresthesia may have been from B12 deficiency    History of iron deficiency anemia and B12 deficiency: she is under the care of Dr. Merlene Pulling and is getting monthly B12. She states no recent iron infusions    HTN:  She denies chest pain or palpitation. She is on Maxzide and norvasc , bp is at goal today continue current regiment.   Vitamin D deficiency: continue rx vitamin D. Last level was at goal . Unchanged   Dumping syndrome: she states doing well lately - unchanged      Patient Active Problem List   Diagnosis Date Noted   Mild intermittent asthma without complication 10/20/2020   Dysthymia 10/20/2020   Primary osteoarthritis of both knees 10/20/2020   Primary osteoarthritis of right knee 04/06/2020   Primary osteoarthritis of left knee 04/06/2020   Pre-diabetes 12/05/2018   Malabsorption of iron 11/07/2018   Chronic pain of right knee 07/26/2017   Vitamin D deficiency 04/24/2017   B12 deficiency 04/24/2017   Abnormal mammogram of right breast 01/01/2017   Asthma, mild persistent 12/17/2016   History of shoulder surgery 10/12/2016   Primary osteoarthritis of left shoulder 06/14/2016   Incomplete  tear of left rotator cuff 05/30/2016   Rotator cuff tendinitis 05/30/2016   History of bariatric surgery 05/04/2016   Allergic rhinitis 05/12/2015   Benign essential HTN 05/12/2015   Anxiety and depression 05/12/2015   Insomnia, persistent 05/12/2015   Dyslipidemia 05/12/2015   Edema extremities 05/12/2015   Gastroesophageal reflux disease without esophagitis 05/12/2015   Hypoglycemia 05/12/2015   Eczema intertrigo 05/12/2015   Migraine 05/12/2015   Plantar fasciitis 05/12/2015   Umbilical hernia without obstruction and without gangrene 05/12/2015   Morbid obesity, unspecified obesity type (HCC) 02/13/2008    Past  Surgical History:  Procedure Laterality Date   ABDOMINAL HYSTERECTOMY  2012   CHOLECYSTECTOMY     COLONOSCOPY WITH PROPOFOL N/A 12/18/2019   Procedure: COLONOSCOPY WITH PROPOFOL;  Surgeon: Toney Reil, MD;  Location[facility];  Service: Gastroenterology;  Laterality: N/A;   DILATION AND CURETTAGE OF UTERUS     ESOPHAGOGASTRODUODENOSCOPY (EGD) WITH PROPOFOL N/A 03/18/2020   Procedure: ESOPHAGOGASTRODUODENOSCOPY (EGD) WITH PROPOFOL;  Surgeon: Toney Reil, MD;  Location: [facility];  Service: Gastroenterology;  Laterality: N/A;   GASTRIC BYPASS  2005   INDUCED ABORTION N/A 1997   patient was about 22 weeks   POLYPECTOMY  12/18/2019   Procedure: POLYPECTOMY;  Surgeon: Toney Reil, MD;  Location: [facility];  Service: Gastroenterology;;   SHOULDER ARTHROSCOPY WITH OPEN ROTATOR CUFF REPAIR Left 06/14/2016   Procedure: SHOULDER ARTHROSCOPY WITH OPEN ROTATOR CUFF REPAIR, DECOMPRESSION, EXCISION LOOSE BODY, DEBRIDEMENT;  Surgeon: Christena Flake, MD;  Location: [facility];  Service: Orthopedics;  Laterality: Left;    Family History  Problem Relation Age of Onset   Hypertension Mother    Cancer Father        prostate   Heart disease Brother    Breast cancer Cousin 40       mat cousin    Social History   Tobacco Use   Smoking status: Never   Smokeless tobacco: Never  Substance Use Topics   Alcohol use: No    Alcohol/week: 0.0 standard drinks     Current Outpatient Medications:    acetaminophen (TYLENOL) 325 MG tablet, Take 325 mg by mouth every 6 (six) hours as needed. PRN, Disp: , Rfl:    albuterol (VENTOLIN HFA) 108 (90 Base) MCG/ACT inhaler, Inhale 2 puffs into the lungs every 6 (six) hours as needed for wheezing or shortness of breath., Disp: 18 g, Rfl: 0   amLODipine (NORVASC) 2.5 MG tablet, Take 1 tablet (2.5 mg total) by mouth daily., Disp: 90 tablet, Rfl: 1   fluticasone (FLONASE) 50 MCG/ACT nasal spray, SHAKE LIQUID AND USE 2 SPRAYS IN  EACH NOSTRIL DAILY, Disp: 48 g, Rfl: 0   loratadine (CLARITIN) 10 MG tablet, Take 1 tablet (10 mg total) by mouth daily., Disp: 90 tablet, Rfl: 1   nystatin cream (MYCOSTATIN), Apply 1 application topically 2 (two) times daily., Disp: 30 g, Rfl: 5   omeprazole (PRILOSEC) 40 MG capsule, TAKE 1 CAPSULE(40 MG) BY MOUTH TWICE DAILY BEFORE MEALS, Disp: 180 capsule, Rfl: 0   pregabalin (LYRICA) 25 MG capsule, Take 1 capsule (25 mg total) by mouth 3 (three) times daily., Disp: 90 capsule, Rfl: 0   QUEtiapine (SEROQUEL) 25 MG tablet, Take 1 tablet (25 mg total) by mouth at bedtime. In place of trazodone for sleep, Disp: 90 tablet, Rfl: 0   rosuvastatin (CRESTOR) 10 MG tablet, Take 1 tablet (10 mg total) by mouth daily., Disp: 90 tablet, Rfl: 1   Semaglutide-Weight  Management 1.7 MG/0.75ML SOAJ, Inject 1.7 mg into the skin once a week for 28 days., Disp: 3 mL, Rfl: 0   [START ON 08/27/2021] Semaglutide-Weight Management 2.4 MG/0.75ML SOAJ, Inject 2.4 mg into the skin once a week for 28 days., Disp: 3 mL, Rfl: 0   triamterene-hydrochlorothiazide (MAXZIDE-25) 37.5-25 MG tablet, Take 1 tablet by mouth daily. In place of HCTZ 25 , BP medication, Disp: 90 tablet, Rfl: 1   Vitamin D, Ergocalciferol, (DRISDOL) 1.25 MG (50000 UNIT) CAPS capsule, Take 1 capsule (50,000 Units total) by mouth every 7 (seven) days., Disp: 12 capsule, Rfl: 1  Allergies  Allergen Reactions   Lisinopril Swelling    Facial Swelling   Ace Inhibitors     angioedema   Codeine Swelling   Valsartan Swelling    I personally reviewed active problem list, medication list, allergies, family history, social history, health maintenance with the patient/caregiver today.   ROS  Ten systems reviewed and is negative except as mentioned in HPI   Objective  Vitals:   08/07/21 0959  BP: 136/82  Pulse: 68  Resp: 16  Temp: 98.1 F (36.7 C)  SpO2: 99%  Weight: 267 lb (121.1 kg)  Height: 5\' 7"  (1.702 m)    Body mass index is 41.82  kg/m.  Physical Exam  Constitutional: Patient appears well-developed and well-nourished. Obese  No distress.  HEENT: head atraumatic, normocephalic, pupils equal and reactive to light, neck supple, Cardiovascular: Normal rate, regular rhythm and normal heart sounds.  No murmur heard. No BLE edema. Pulmonary/Chest: Effort normal and breath sounds normal. No respiratory distress. Abdominal: Soft.  There is no tenderness. Psychiatric: Patient has a normal mood and affect. behavior is normal. Judgment and thought content normal.   Recent Results (from the past 2160 hour(s))  Iron and TIBC     Status: None   Collection Time: 05/30/21  9:22 AM  Result Value Ref Range   Iron 105 28 - 170 ug/dL   TIBC 06/01/21 597 - 416 ug/dL   Saturation Ratios 25 10.4 - 31.8 %   UIBC 322 ug/dL    Comment: Performed at [facility], [address] [place], [place]  Ferritin     Status: None   Collection Time: 05/30/21  9:22 AM  Result Value Ref Range   Ferritin 28 11 - 307 ng/mL    Comment: Performed at [facility], [address] [place], [place]  CBC with Differential     Status: None   Collection Time: 05/30/21  9:22 AM  Result Value Ref Range   WBC 5.3 4.0 - 10.5 K/uL   RBC 4.34 3.87 - 5.11 MIL/uL   Hemoglobin 13.1 12.0 - 15.0 g/dL   HCT 06/01/21 22.4 - 82.5 %   MCV 87.1 80.0 - 100.0 fL   MCH 30.2 26.0 - 34.0 pg   MCHC 34.7 30.0 - 36.0 g/dL   RDW 00.3 70.4 - 88.8 %   Platelets 250 150 - 400 K/uL   nRBC 0.0 0.0 - 0.2 %   Neutrophils Relative % 58 %   Neutro Abs 3.0 1.7 - 7.7 K/uL   Lymphocytes Relative 32 %   Lymphs Abs 1.7 0.7 - 4.0 K/uL   Monocytes Relative 8 %   Monocytes Absolute 0.4 0.1 - 1.0 K/uL   Eosinophils Relative 2 %   Eosinophils Absolute 0.1 0.0 - 0.5 K/uL   Basophils Relative 0 %   Basophils Absolute 0.0 0.0 - 0.1 K/uL  Immature Granulocytes 0 %   Abs Immature Granulocytes 0.02 0.00 - 0.07 K/uL    Comment: Performed at [facility], [address] [place], [place] [address]  Cervicovaginal ancillary only     Status: Abnormal   Collection Time: 07/07/21  8:33 AM  Result Value Ref Range   Bacterial Vaginitis (gardnerella) Positive (A)    Candida Vaginitis Negative    Candida Glabrata Negative    Comment      Normal Reference Range Bacterial Vaginosis - Negative   Comment Normal Reference Range Candida Species - Negative    Comment Normal Reference Range Candida Galbrata - Negative   POCT Urinalysis Dipstick     Status: Abnormal   Collection Time: 07/07/21  8:45 AM  Result Value Ref Range   Color, UA yellow    Clarity, UA cloudy    Glucose, UA Negative Negative   Bilirubin, UA Small    Ketones, UA 5    Spec Grav, UA 1.015 1.010 - 1.025   Blood, UA Negative    pH, UA 6.5 5.0 - 8.0   Protein, UA Negative Negative   Urobilinogen, UA 2.0 (A) 0.2 or 1.0 E.U./dL   Nitrite, UA Negative    Leukocytes, UA Negative Negative   Appearance     Odor       PHQ2/9: Depression screen [facility] 2/9 08/07/2021 07/07/2021 06/27/2021 05/03/2021 01/18/2021  Decreased Interest 0 0 0 1 0  Down, Depressed, Hopeless 0 0 0 1 0  PHQ - 2 Score 0 0 0 2 0  Altered sleeping 0 0 0 1 -  Tired, decreased energy 0 0 0 1 -  Change in appetite 0 0 2 0 -  Feeling bad or failure about yourself  0 0 0 0 -  Trouble concentrating 3 0 2 1 -  Moving slowly or fidgety/restless 0 0 0 0 -  Suicidal thoughts 0 0 0 0 -  PHQ-9 Score 3 0 4 5 -  Difficult doing work/chores - Not difficult at all - - -  Some recent data might be hidden    phq 9 is negative   Fall Risk: Fall Risk  08/07/2021 07/07/2021 06/27/2021 05/03/2021 01/18/2021  Falls in the past year? 0 0 0 0 0  Number falls in past yr: 0 0 0 0 0  Injury with Fall? 0 1 0 0 0  Risk for fall due to : No Fall Risks - No Fall Risks - -  Follow up Falls prevention discussed - Falls prevention discussed - -      Functional Status Survey: Is the patient deaf or have difficulty hearing?: No Does  the patient have difficulty seeing, even when wearing glasses/contacts?: Yes Does the patient have difficulty concentrating, remembering, or making decisions?: Yes Does the patient have difficulty walking or climbing stairs?: Yes Does the patient have difficulty dressing or bathing?: No Does the patient have difficulty doing errands alone such as visiting a doctor's office or shopping?: No   Assessment & Plan  1. Morbid obesity with BMI of 40.0-44.9, adult [facility])  Discussed with the patient the risk posed by an increased BMI. Discussed importance of portion control, calorie counting and at least 150 minutes of physical activity weekly. Avoid sweet beverages and drink more water. Eat at least 6 servings of fruit and vegetables daily    2. B12 deficiency   3. Vitamin D deficiency   4. Need for shingles vaccine  - Varicella-zoster vaccine IM (Shingrix)  5.  Benign essential HTN   6. Dyslipidemia   7. Primary osteoarthritis of both knees   8. GERD without esophagitis   9. History of iron deficiency   10. History of bariatric surgery   11. Pre-diabetes   12. Hyperglycemia   13. Insomnia due to other mental disorder  - QUEtiapine (SEROQUEL) 25 MG tablet; Take 1 tablet (25 mg total) by mouth at bedtime. In place of trazodone for sleep  Dispense: 90 tablet; Refill: 0

## 2021-08-28 ENCOUNTER — Encounter: Payer: Self-pay | Admitting: Family Medicine

## 2021-08-30 ENCOUNTER — Inpatient Hospital Stay: Payer: BC Managed Care – PPO

## 2021-09-18 ENCOUNTER — Other Ambulatory Visit: Payer: Self-pay | Admitting: Gastroenterology

## 2021-09-18 DIAGNOSIS — K219 Gastro-esophageal reflux disease without esophagitis: Secondary | ICD-10-CM

## 2021-09-19 ENCOUNTER — Other Ambulatory Visit: Payer: Self-pay

## 2021-09-19 ENCOUNTER — Other Ambulatory Visit: Payer: Self-pay | Admitting: Gastroenterology

## 2021-09-19 DIAGNOSIS — K219 Gastro-esophageal reflux disease without esophagitis: Secondary | ICD-10-CM

## 2021-09-19 MED ORDER — OMEPRAZOLE 40 MG PO CPDR
DELAYED_RELEASE_CAPSULE | ORAL | 0 refills | Status: DC
Start: 2021-09-19 — End: 2022-08-08

## 2021-09-19 NOTE — Progress Notes (Signed)
Sent in refill for omeprazole fas requested

## 2021-09-27 ENCOUNTER — Inpatient Hospital Stay: Payer: BC Managed Care – PPO | Attending: Family Medicine

## 2021-10-02 ENCOUNTER — Telehealth: Payer: Self-pay | Admitting: Oncology

## 2021-10-02 NOTE — Telephone Encounter (Signed)
Pt called to see if she could change the date of her appt. Please give her a call back at [phone]

## 2021-10-09 ENCOUNTER — Other Ambulatory Visit: Payer: Self-pay | Admitting: Family Medicine

## 2021-10-09 DIAGNOSIS — E559 Vitamin D deficiency, unspecified: Secondary | ICD-10-CM

## 2021-10-30 ENCOUNTER — Encounter: Payer: Self-pay | Admitting: Internal Medicine

## 2021-10-31 ENCOUNTER — Other Ambulatory Visit: Payer: Self-pay

## 2021-10-31 ENCOUNTER — Inpatient Hospital Stay: Payer: BC Managed Care – PPO | Attending: Oncology

## 2021-10-31 DIAGNOSIS — E538 Deficiency of other specified B group vitamins: Secondary | ICD-10-CM | POA: Insufficient documentation

## 2021-10-31 DIAGNOSIS — Z9884 Bariatric surgery status: Secondary | ICD-10-CM | POA: Insufficient documentation

## 2021-10-31 DIAGNOSIS — D508 Other iron deficiency anemias: Secondary | ICD-10-CM | POA: Diagnosis not present

## 2021-10-31 DIAGNOSIS — K909 Intestinal malabsorption, unspecified: Secondary | ICD-10-CM

## 2021-10-31 MED ORDER — CYANOCOBALAMIN 1000 MCG/ML IJ SOLN
1000.0000 ug | Freq: Once | INTRAMUSCULAR | Status: AC
  Administered 2021-10-31: 11:00:00 1000 ug via INTRAMUSCULAR
  Filled 2021-10-31: qty 1

## 2021-11-01 ENCOUNTER — Inpatient Hospital Stay: Payer: BC Managed Care – PPO

## 2021-11-22 ENCOUNTER — Other Ambulatory Visit: Payer: Self-pay | Admitting: *Deleted

## 2021-11-22 DIAGNOSIS — K909 Intestinal malabsorption, unspecified: Secondary | ICD-10-CM

## 2021-11-28 ENCOUNTER — Other Ambulatory Visit: Payer: Self-pay | Admitting: Family Medicine

## 2021-11-28 DIAGNOSIS — E559 Vitamin D deficiency, unspecified: Secondary | ICD-10-CM

## 2021-11-29 ENCOUNTER — Inpatient Hospital Stay: Payer: BC Managed Care – PPO | Attending: Oncology

## 2021-11-29 ENCOUNTER — Ambulatory Visit: Payer: BC Managed Care – PPO

## 2021-11-29 ENCOUNTER — Other Ambulatory Visit: Payer: BC Managed Care – PPO

## 2021-11-29 ENCOUNTER — Inpatient Hospital Stay (HOSPITAL_BASED_OUTPATIENT_CLINIC_OR_DEPARTMENT_OTHER): Payer: BC Managed Care – PPO | Admitting: Oncology

## 2021-11-29 ENCOUNTER — Other Ambulatory Visit: Payer: Self-pay

## 2021-11-29 ENCOUNTER — Encounter: Payer: Self-pay | Admitting: Oncology

## 2021-11-29 ENCOUNTER — Ambulatory Visit: Payer: BC Managed Care – PPO | Admitting: Oncology

## 2021-11-29 ENCOUNTER — Inpatient Hospital Stay: Payer: BC Managed Care – PPO

## 2021-11-29 VITALS — BP 142/77 | HR 68 | Temp 98.5°F | Resp 16 | Ht 67.0 in | Wt 273.0 lb

## 2021-11-29 DIAGNOSIS — Z79899 Other long term (current) drug therapy: Secondary | ICD-10-CM | POA: Diagnosis not present

## 2021-11-29 DIAGNOSIS — K909 Intestinal malabsorption, unspecified: Secondary | ICD-10-CM

## 2021-11-29 DIAGNOSIS — E538 Deficiency of other specified B group vitamins: Secondary | ICD-10-CM | POA: Diagnosis not present

## 2021-11-29 DIAGNOSIS — I10 Essential (primary) hypertension: Secondary | ICD-10-CM | POA: Diagnosis not present

## 2021-11-29 DIAGNOSIS — Z9884 Bariatric surgery status: Secondary | ICD-10-CM | POA: Diagnosis not present

## 2021-11-29 DIAGNOSIS — D508 Other iron deficiency anemias: Secondary | ICD-10-CM | POA: Insufficient documentation

## 2021-11-29 DIAGNOSIS — D509 Iron deficiency anemia, unspecified: Secondary | ICD-10-CM | POA: Diagnosis not present

## 2021-11-29 LAB — CBC WITH DIFFERENTIAL/PLATELET
Abs Immature Granulocytes: 0.01 10*3/uL (ref 0.00–0.07)
Basophils Absolute: 0 10*3/uL (ref 0.0–0.1)
Basophils Relative: 0 %
Eosinophils Absolute: 0.1 10*3/uL (ref 0.0–0.5)
Eosinophils Relative: 2 %
HCT: 37.4 % (ref 36.0–46.0)
Hemoglobin: 12.9 g/dL (ref 12.0–15.0)
Immature Granulocytes: 0 %
Lymphocytes Relative: 39 %
Lymphs Abs: 2.8 10*3/uL (ref 0.7–4.0)
MCH: 30.4 pg (ref 26.0–34.0)
MCHC: 34.5 g/dL (ref 30.0–36.0)
MCV: 88 fL (ref 80.0–100.0)
Monocytes Absolute: 0.5 10*3/uL (ref 0.1–1.0)
Monocytes Relative: 7 %
Neutro Abs: 3.7 10*3/uL (ref 1.7–7.7)
Neutrophils Relative %: 52 %
Platelets: 248 10*3/uL (ref 150–400)
RBC: 4.25 MIL/uL (ref 3.87–5.11)
RDW: 12.9 % (ref 11.5–15.5)
WBC: 7.1 10*3/uL (ref 4.0–10.5)
nRBC: 0 % (ref 0.0–0.2)

## 2021-11-29 LAB — IRON AND TIBC
Iron: 66 ug/dL (ref 28–170)
Saturation Ratios: 15 % (ref 10.4–31.8)
TIBC: 438 ug/dL (ref 250–450)
UIBC: 372 ug/dL

## 2021-11-29 LAB — FERRITIN: Ferritin: 34 ng/mL (ref 11–307)

## 2021-11-29 MED ORDER — CYANOCOBALAMIN 1000 MCG/ML IJ SOLN
1000.0000 ug | Freq: Once | INTRAMUSCULAR | Status: AC
  Administered 2021-11-29: 16:00:00 1000 ug via INTRAMUSCULAR
  Filled 2021-11-29: qty 1

## 2021-11-29 NOTE — Progress Notes (Signed)
Hematology/Oncology Consult note [facility]  Telephone:([phone] [phone]  Patient Care Team: Steele Sizer, MD as PCP - General (Family Medicine) Rod Can, CNM as Midwife (Obstetrics) Sindy Guadeloupe, MD as Medical Oncologist (Hematology and Oncology)   Name of the patient: Savannah Manning  NX:[id number]  Feb 19, 1961   Date of visit: 11/29/21  Diagnosis- history of gastric bypass and iron and B12 deficiency anemia  Chief complaint/ Reason for visit-routine follow-up of iron deficiency anemia  Heme/Onc history:  Patient is a 61 year old female with history of gastric bypass surgery in 2005 and subsequent iron and B12 deficiency anemia.  Hysterectomy in 2012.  Last colonoscopy in February 2021.  EGD in May 2021 which showed Roux-en-Y gastrojejunostomy with GJ anastomosis characterized by healthy-appearing mucosa.  She is currently on monthly B12 injections    Interval history-reports doing well overall.  Denies any blood loss in her stool or urine.  Denies any dark melanotic stools.  She has been having some upper left chest wall pain and was also seen by Dr. Saralyn Pilar.  Cardiac etiology was ruled out.  ECOG PS- 0 Pain scale- 0  Review of systems- Review of Systems  Constitutional:  Negative for chills, fever, malaise/fatigue and weight loss.  HENT:  Negative for congestion, ear discharge and nosebleeds.   Eyes:  Negative for blurred vision.  Respiratory:  Negative for cough, hemoptysis, sputum production, shortness of breath and wheezing.   Cardiovascular:  Negative for chest pain, palpitations, orthopnea and claudication.  Gastrointestinal:  Negative for abdominal pain, blood in stool, constipation, diarrhea, heartburn, melena, nausea and vomiting.  Genitourinary:  Negative for dysuria, flank pain, frequency, hematuria and urgency.  Musculoskeletal:  Negative for back pain, joint pain and myalgias.  Skin:  Negative for rash.   Neurological:  Negative for dizziness, tingling, focal weakness, seizures, weakness and headaches.  Endo/Heme/Allergies:  Does not bruise/bleed easily.  Psychiatric/Behavioral:  Negative for depression and suicidal ideas. The patient does not have insomnia.     Allergies  Allergen Reactions   Lisinopril Swelling    Facial Swelling   Ace Inhibitors     angioedema   Codeine Swelling   Valsartan Swelling     Past Medical History:  Diagnosis Date   Anemia    Arthritis    Asthma    GERD (gastroesophageal reflux disease)    Headache    Hypertension    Iron deficiency anemia 12/05/2018   Wears contact lenses      Past Surgical History:  Procedure Laterality Date   ABDOMINAL HYSTERECTOMY  2012   CHOLECYSTECTOMY     COLONOSCOPY WITH PROPOFOL N/A 12/18/2019   Procedure: COLONOSCOPY WITH PROPOFOL;  Surgeon: Lin Landsman, MD;  Location: [place];  Service: Gastroenterology;  Laterality: N/A;   DILATION AND CURETTAGE OF UTERUS     ESOPHAGOGASTRODUODENOSCOPY (EGD) WITH PROPOFOL N/A 03/18/2020   Procedure: ESOPHAGOGASTRODUODENOSCOPY (EGD) WITH PROPOFOL;  Surgeon: Lin Landsman, MD;  Location: [facility];  Service: Gastroenterology;  Laterality: N/A;   GASTRIC BYPASS  2005   INDUCED ABORTION N/A 1997   patient was about 22 weeks   POLYPECTOMY  12/18/2019   Procedure: POLYPECTOMY;  Surgeon: Lin Landsman, MD;  Location: [place];  Service: Gastroenterology;;   SHOULDER ARTHROSCOPY WITH OPEN ROTATOR CUFF REPAIR Left 06/14/2016   Procedure: SHOULDER ARTHROSCOPY WITH OPEN ROTATOR CUFF REPAIR, DECOMPRESSION, EXCISION LOOSE BODY, DEBRIDEMENT;  Surgeon: Corky Mull, MD;  Location: [facility];  Service: Orthopedics;  Laterality:  Left;    Social History   Socioeconomic History   Marital status: Single    Spouse name: Not on file   Number of children: 1   Years of education: Not on file   Highest education level: Not on file  Occupational History    Not on file  Tobacco Use   Smoking status: Never   Smokeless tobacco: Never  Vaping Use   Vaping Use: Never used  Substance and Sexual Activity   Alcohol use: No    Alcohol/week: 0.0 standard drinks   Drug use: No   Sexual activity: Not Currently  Other Topics Concern   Not on file  Social History Narrative   Desk job/works for insurance; no smoking; no alcohol; lives in [place].    Social Determinants of Health   Financial Resource Strain: Not on file  Food Insecurity: Not on file  Transportation Needs: Not on file  Physical Activity: Not on file  Stress: Not on file  Social Connections: Not on file  Intimate Partner Violence: Not on file    Family History  Problem Relation Age of Onset   Hypertension Mother    Cancer Father        prostate   Heart disease Brother    Breast cancer Cousin 87       mat cousin     Current Outpatient Medications:    acetaminophen (TYLENOL) 325 MG tablet, Take 325 mg by mouth every 6 (six) hours as needed. PRN, Disp: , Rfl:    albuterol (VENTOLIN HFA) 108 (90 Base) MCG/ACT inhaler, Inhale 2 puffs into the lungs every 6 (six) hours as needed for wheezing or shortness of breath., Disp: 18 g, Rfl: 0   amLODipine (NORVASC) 2.5 MG tablet, Take 1 tablet (2.5 mg total) by mouth daily., Disp: 90 tablet, Rfl: 1   fluticasone (FLONASE) 50 MCG/ACT nasal spray, SHAKE LIQUID AND USE 2 SPRAYS IN EACH NOSTRIL DAILY, Disp: 48 g, Rfl: 0   loratadine (CLARITIN) 10 MG tablet, Take 1 tablet (10 mg total) by mouth daily., Disp: 90 tablet, Rfl: 1   nystatin cream (MYCOSTATIN), Apply 1 application topically 2 (two) times daily., Disp: 30 g, Rfl: 5   omeprazole (PRILOSEC) 40 MG capsule, TAKE 1 CAPSULE(40 MG) BY MOUTH TWICE DAILY BEFORE MEALS, Disp: 180 capsule, Rfl: 0   omeprazole (PRILOSEC) 40 MG capsule, TAKE 1 CAPSULE(40 MG) BY MOUTH TWICE DAILY BEFORE MEALS, Disp: 180 capsule, Rfl: 0   pregabalin (LYRICA) 25 MG capsule, Take 1 capsule (25 mg total) by mouth  3 (three) times daily., Disp: 90 capsule, Rfl: 0   QUEtiapine (SEROQUEL) 25 MG tablet, Take 1 tablet (25 mg total) by mouth at bedtime. In place of trazodone for sleep, Disp: 90 tablet, Rfl: 0   rosuvastatin (CRESTOR) 10 MG tablet, Take 1 tablet (10 mg total) by mouth daily., Disp: 90 tablet, Rfl: 1   triamterene-hydrochlorothiazide (MAXZIDE-25) 37.5-25 MG tablet, Take 1 tablet by mouth daily. In place of HCTZ 25 , BP medication, Disp: 90 tablet, Rfl: 1   Vitamin D, Ergocalciferol, (DRISDOL) 1.25 MG (50000 UNIT) CAPS capsule, TAKE 1 CAPSULE BY MOUTH EVERY 7 DAYS, Disp: 12 capsule, Rfl: 0  Physical exam:  Vitals:   11/29/21 1504  BP: (!) 142/77  Pulse: 68  Resp: 16  Temp: 98.5 F (36.9 C)  TempSrc: Tympanic  SpO2: 99%  Weight: 273 lb (123.8 kg)  Height: 5\' 7"  (1.702 m)   Physical Exam Cardiovascular:     Rate and Rhythm:  Normal rate and regular rhythm.     Heart sounds: Normal heart sounds.  Pulmonary:     Effort: Pulmonary effort is normal.     Breath sounds: Normal breath sounds.  Abdominal:     General: Bowel sounds are normal.     Palpations: Abdomen is soft.  Skin:    General: Skin is warm and dry.  Neurological:     Mental Status: She is alert and oriented to person, place, and time.     CMP Latest Ref Rng & Units 10/20/2020  Glucose 65 - 99 mg/dL 87  BUN 7 - 25 mg/dL 14  Creatinine 0.50 - 1.05 mg/dL 0.73  Sodium 135 - 146 mmol/L 140  Potassium 3.5 - 5.3 mmol/L 4.2  Chloride 98 - 110 mmol/L 104  CO2 20 - 32 mmol/L 31  Calcium 8.6 - 10.4 mg/dL 9.2  Total Protein 6.1 - 8.1 g/dL 6.1  Total Bilirubin 0.2 - 1.2 mg/dL 0.4  Alkaline Phos 38 - 126 U/L -  AST 10 - 35 U/L 12  ALT 6 - 29 U/L 9   CBC Latest Ref Rng & Units 11/29/2021  WBC 4.0 - 10.5 K/uL 7.1  Hemoglobin 12.0 - 15.0 g/dL 12.9  Hematocrit 36.0 - 46.0 % 37.4  Platelets 150 - 400 K/uL 248     Assessment and plan- Patient is a 61 y.o. female with history of iron and B12 deficiency anemia secondary to  gastric bypass here for a routine follow-up  Patient is not currently anemic with a hemoglobin of 12.9.  Ferritin levels are normal at 34.  She is not overtly symptomatic at this time.  Iron saturation is mildly low at 15%.  TIBC is normal.  I will hold off on giving her IV iron at this time.  Repeat CBC ferritin and iron studies in 3 in 6 months and I will see her back in 6 months  B12 deficiency: Continue monthly B12 injections   Visit Diagnosis 1. History of gastric bypass   2. Iron deficiency anemia, unspecified iron deficiency anemia type   3. B12 deficiency      Dr. Randa Evens, MD, MPH [facility] [id number] 11/29/2021 2:31 PM

## 2021-12-01 ENCOUNTER — Other Ambulatory Visit: Payer: Self-pay

## 2021-12-01 ENCOUNTER — Emergency Department
Admission: EM | Admit: 2021-12-01 | Discharge: 2021-12-01 | Disposition: A | Discharge status: Home / Self Care | Payer: BC Managed Care – PPO | Attending: Emergency Medicine | Admitting: Emergency Medicine

## 2021-12-01 ENCOUNTER — Emergency Department: Payer: BC Managed Care – PPO

## 2021-12-01 ENCOUNTER — Ambulatory Visit: Payer: Self-pay | Admitting: *Deleted

## 2021-12-01 ENCOUNTER — Encounter: Payer: Self-pay | Admitting: Emergency Medicine

## 2021-12-01 DIAGNOSIS — R0602 Shortness of breath: Secondary | ICD-10-CM | POA: Diagnosis not present

## 2021-12-01 DIAGNOSIS — R0789 Other chest pain: Secondary | ICD-10-CM | POA: Diagnosis not present

## 2021-12-01 DIAGNOSIS — R079 Chest pain, unspecified: Secondary | ICD-10-CM | POA: Insufficient documentation

## 2021-12-01 DIAGNOSIS — I10 Essential (primary) hypertension: Secondary | ICD-10-CM | POA: Diagnosis not present

## 2021-12-01 LAB — CBC
HCT: 36.9 % (ref 36.0–46.0)
Hemoglobin: 12.5 g/dL (ref 12.0–15.0)
MCH: 30.9 pg (ref 26.0–34.0)
MCHC: 33.9 g/dL (ref 30.0–36.0)
MCV: 91.3 fL (ref 80.0–100.0)
Platelets: 236 10*3/uL (ref 150–400)
RBC: 4.04 MIL/uL (ref 3.87–5.11)
RDW: 13 % (ref 11.5–15.5)
WBC: 6.2 10*3/uL (ref 4.0–10.5)
nRBC: 0 % (ref 0.0–0.2)

## 2021-12-01 LAB — TROPONIN I (HIGH SENSITIVITY): Troponin I (High Sensitivity): 4 ng/L (ref <18)

## 2021-12-01 LAB — BASIC METABOLIC PANEL
Anion gap: 9 (ref 5–15)
BUN: 12 mg/dL (ref 6–20)
CO2: 25 mmol/L (ref 22–32)
Calcium: 9 mg/dL (ref 8.9–10.3)
Chloride: 104 mmol/L (ref 98–111)
Creatinine, Ser: 0.89 mg/dL (ref 0.44–1.00)
GFR, Estimated: >60 mL/min (ref >60)
Glucose, Bld: 86 mg/dL (ref 70–99)
Potassium: 3.9 mmol/L (ref 3.5–5.1)
Sodium: 138 mmol/L (ref 135–145)

## 2021-12-01 NOTE — ED Notes (Signed)
See triage note presents with some chest discomfort and SOB for the past 2 days    no fever

## 2021-12-01 NOTE — Telephone Encounter (Signed)
°  Chief Complaint: chest pain Symptoms: chest pain/tightness, back pain Frequency: 2 days Pertinent Negatives: Patient denies  dizziness, nausea, vomiting, sweating, fever, difficulty breathing, cough Disposition: [x] ED /[] Urgent Care (no appt availability in office) / [] Appointment(In office/virtual)/ []  [facility]/ [] Home Care/ [] Refused Recommended Disposition /[] [facility]/ []  Follow-up with PCP Additional Notes:     Reason for Disposition  [1] Chest pain lasts > 5 minutes AND [2] occurred in past 3 days (72 hours) (Exception: feels exactly the same as previously diagnosed heartburn and has accompanying sour taste in mouth)  Answer Assessment - Initial Assessment Questions 1. LOCATION: "Where does it hurt?"       Left side- tightness 2. RADIATION: "Does the pain go anywhere else?" (e.g., into neck, jaw, arms, back)     Back pain 3. ONSET: "When did the chest pain begin?" (Minutes, hours or days)      2 days  4. PATTERN "Does the pain come and go, or has it been constant since it started?"  "Does it get worse with exertion?"      constant 5. DURATION: "How long does it last" (e.g., seconds, minutes, hours)     ongoing 6. SEVERITY: "How bad is the pain?"  (e.g., Scale 1-10; mild, moderate, or severe)    - MILD (1-3): doesn't interfere with normal activities     - MODERATE (4-7): interferes with normal activities or awakens from sleep    - SEVERE (8-10): excruciating pain, unable to do any normal activities       mild 7. CARDIAC RISK FACTORS: "Do you have any history of heart problems or risk factors for heart disease?" (e.g., angina, prior heart attack; diabetes, high blood pressure, high cholesterol, smoker, or strong family history of heart disease)     High BP, cholesterol 8. PULMONARY RISK FACTORS: "Do you have any history of lung disease?"  (e.g., blood clots in lung, asthma, emphysema, birth control pills)     *No Answer* 9. CAUSE: "What do you think is  causing the chest pain?"     Recent increase in activity- cleaned closet 10. OTHER SYMPTOMS: "Do you have any other symptoms?" (e.g., dizziness, nausea, vomiting, sweating, fever, difficulty breathing, cough)       no 11. PREGNANCY: "Is there any chance you are pregnant?" "When was your last menstrual period?"       *No Answer*  Protocols used: Chest Pain-A-AH

## 2021-12-01 NOTE — ED Provider Notes (Signed)
[facility] Provider Note    Event Date/Time   First MD Initiated Contact with Patient 12/01/21 1415     (approximate)   History   Chief Complaint Chest Pain   HPI Savannah Manning is a 61 y.o. female, history of hypertension, obesity, anxiety/depression, prediabetes, presents to the emergency department for evaluation of chest pain.  Patient states that this is been going on for the past 2 to 3 days.  She says it started recently after lifting heavy objects from her house.  Describes the pain as a dull sensation, particularly on her left side with some radiation to her back.  Patient states that the pain is constant and is exacerbated by flexion and extension of her back.  Denies fever/chills, shortness of breath, abdominal pain, flank pain, numbness/tingliness in upper or lower extremities, or urinary symptoms.  Patient additionally notes that this is happened to her a few times before over the past few years and has had negative work-ups..  History Limitations: No limitations.      Physical Exam  Triage Vital Signs: ED Triage Vitals  Enc Vitals Group     BP 12/01/21 1202 (!) 146/82     Pulse Rate 12/01/21 1202 (!) 55     Resp 12/01/21 1202 18     Temp 12/01/21 1205 97.8 F (36.6 C)     Temp Source 12/01/21 1202 Oral     SpO2 12/01/21 1202 99 %     Weight 12/01/21 1203 270 lb (122.5 kg)     Height 12/01/21 1203 5\' 7"  (1.702 m)     Head Circumference --      Peak Flow --      Pain Score 12/01/21 1203 4     Pain Loc --      Pain Edu? --      Excl. in GC? --     Most recent vital signs: Vitals:   12/01/21 1202 12/01/21 1205  BP: (!) 146/82   Pulse: (!) 55   Resp: 18   Temp:  97.8 F (36.6 C)  SpO2: 99%     General: Awake, NAD.  CV: Good peripheral perfusion.  S1/S2 present.  No murmurs rubs or gallops. Resp: Normal effort.  Lung sounds clear bilaterally in the apices and bases. Abd: Soft, non-tender. No distention.  Neuro: At baseline   Other: Not applicable.  Physical Exam    ED Results / Procedures / Treatments  Labs (all labs ordered are listed, but only abnormal results are displayed) Labs Reviewed  BASIC METABOLIC PANEL  CBC  TROPONIN I (HIGH SENSITIVITY)  TROPONIN I (HIGH SENSITIVITY)     EKG Bradycardia, rate of 54, no segment changes, no axis deviations, no AV blocks.   RADIOLOGY  ED Provider Interpretation: I personally reviewed this chest x-ray.  No acute cardiopulmonary disease.  DG Chest 2 View  Result Date: 12/01/2021 CLINICAL DATA:  61 year old female with history of left-sided chest pain and shortness of breath for the past 2 days. EXAM: CHEST - 2 VIEW COMPARISON:  Chest x-ray 10/22/2016. FINDINGS: Lung volumes are normal. No consolidative airspace disease. No pleural effusions. No pneumothorax. No pulmonary nodule or mass noted. Pulmonary vasculature and the cardiomediastinal silhouette are within normal limits. IMPRESSION: No radiographic evidence of acute cardiopulmonary disease. Electronically Signed   By: 10/24/2016 M.D.   On: 12/01/2021 12:29    PROCEDURES:  Critical Care performed: None.  Procedures    MEDICATIONS ORDERED IN ED: Medications - No  data to display   IMPRESSION / MDM / ASSESSMENT AND PLAN / ED COURSE  I reviewed the triage vital signs and the nursing notes.                              Savannah Manning is a 61 y.o. female, history of hypertension, obesity, anxiety/depression, prediabetes, presents to the emergency department for evaluation of chest pain.  Patient states that this is been going on for the past 2 to 3 days.  She says it started recently after lifting heavy objects from her house.  Describes the pain as a dull sensation, particularly on her left side with some radiation to her back.  Patient states that the pain is constant and is exacerbated by flexion and extension of her back.  Differential diagnosis includes, but is not limited to, ACS,  costochondritis, chest wall/back strain, pulmonary embolism, aortic dissection, viral respiratory syndrome  ED Course CBC unremarkable for leukocytosis or anemia.  BMP unremarkable for electrolyte abnormalities or kidney injury.  Troponin 4.  EKG unremarkable.  ACS unlikely  Checks x-ray shows no acute cardiopulmonary abnormalities.  Assessment/Plan History, physical exam, work-up thus far not consistent with any serious or life-threatening pathology.  Low suspicion for ACS given unremarkable EKG and troponin of 4.  Patient is PERC positive given her age, but negative on Wells criteria.  Presentation does not appear consistent with aortic dissection.  Lack of cough, congestion, fever makes diagnosis of infectious pathology unlikely.  I suspect that the patient's pain is likely musculoskeletal given her endorsement of pain with flexion/extension and onset in the setting of lifting heavy objects.  We will plan to discharge this patient with primary care follow-up as needed.  Patient was provided with anticipatory guidance, return precautions, and educational material. Encouraged the patient to return to the emergency department at any time if they begin to experience any new or worsening symptoms.       FINAL CLINICAL IMPRESSION(S) / ED DIAGNOSES   Final diagnoses:  Chest pain, unspecified type     Rx / DC Orders   ED Discharge Orders     None        Note:  This document was prepared using Dragon voice recognition software and may include unintentional dictation errors.   Varney Daily, [place] 12/01/21 2003    Chesley Noon, MD 12/02/21 1505

## 2021-12-01 NOTE — Discharge Instructions (Addendum)
-  Take Tylenol/ibuprofen as needed for pain. -Follow-up with your primary care provider as discussed if symptoms fail to improve -Return to the emergency department anytime if you begin to experience any new or worsening symptoms.

## 2021-12-01 NOTE — ED Triage Notes (Signed)
Pt c/o left sided chest pain with SOB over the past 2 days. Pt is in NAD, ambulatory with a steady gait

## 2021-12-04 ENCOUNTER — Encounter: Payer: Self-pay | Admitting: Physician Assistant

## 2021-12-04 ENCOUNTER — Ambulatory Visit: Payer: Self-pay

## 2021-12-04 ENCOUNTER — Other Ambulatory Visit: Payer: Self-pay | Admitting: Physician Assistant

## 2021-12-04 ENCOUNTER — Ambulatory Visit (INDEPENDENT_AMBULATORY_CARE_PROVIDER_SITE_OTHER): Payer: BC Managed Care – PPO | Admitting: Physician Assistant

## 2021-12-04 VITALS — BP 152/86 | HR 60 | Temp 97.7°F | Resp 18 | Ht 67.0 in | Wt 271.7 lb

## 2021-12-04 DIAGNOSIS — M549 Dorsalgia, unspecified: Secondary | ICD-10-CM

## 2021-12-04 DIAGNOSIS — R42 Dizziness and giddiness: Secondary | ICD-10-CM | POA: Insufficient documentation

## 2021-12-04 MED ORDER — MECLIZINE HCL 12.5 MG PO TABS: 12.5000 mg | ORAL_TABLET | Freq: Three times a day (TID) | ORAL | 0 refills | Status: DC | PRN

## 2021-12-04 MED ORDER — MELOXICAM 15 MG PO TABS: 15.0000 mg | ORAL_TABLET | Freq: Every day | ORAL | 0 refills | Status: DC

## 2021-12-04 NOTE — Telephone Encounter (Signed)
Requested medications are due for refill today.  no  Requested medications are on the active medications list.  yes  Last refill. 12/04/2020  Future visit scheduled.   yes  Notes to clinic.  Rx refilled 12/04/2020. Pt requesting 90 day supply.    Requested Prescriptions  Pending Prescriptions Disp Refills   meloxicam (MOBIC) 15 MG tablet [Pharmacy Med Name: MELOXICAM 15MG  TABLETS] 90 tablet     Sig: TAKE 1 TABLET(15 MG) BY MOUTH DAILY     Analgesics:  COX2 Inhibitors Passed - 12/04/2021 12:25 PM      Passed - HGB in normal range and within 360 days    Hemoglobin  Date Value Ref Range Status  12/01/2021 12.5 12.0 - 15.0 g/dL Final  12/03/2021 68/10/7516 11.1 - 15.9 g/dL Final          Passed - Cr in normal range and within 360 days    Creat  Date Value Ref Range Status  10/20/2020 0.73 0.50 - 1.05 mg/dL Final    Comment:    For patients >62 years of age, the reference limit for Creatinine is approximately 13% higher for people identified as African-American. .    Creatinine, Ser  Date Value Ref Range Status  12/01/2021 0.89 0.44 - 1.00 mg/dL Final          Passed - Patient is not pregnant      Passed - Valid encounter within last 12 months    Recent Outpatient Visits           Today Dizziness   [facility] Mecum, Erin E, PA-C   3 months ago Morbid obesity with BMI of 40.0-44.9, adult [facility])   [facility], MD   5 months ago Foul smelling urine   [facility], FNP   5 months ago COVID-19   [facility], MD   7 months ago Morbid obesity with BMI of 40.0-44.9, adult [facility])   [facility], MD       Future Appointments             In 1 month Alba Cory, MD [facility]

## 2021-12-04 NOTE — Progress Notes (Signed)
Acute Office Visit  Subjective:    Patient ID: Savannah Manning, female    DOB: 1961-07-28, 61 y.o.   MRN: [id number]  Introduced myself to the patient as a PA-C and provided education on APPs in clinical practice.    Chief Complaint  Patient presents with   Dizziness   Nausea    Woke up this morning feeling like this.   Back Pain    Left side but also radiates to front chest on left side. Pt was just seen in the ER for this    HPI Patient is in today for nausea, dizziness and back pain  Nausea Reports nausea began this morning after she got up and started trying to move around after waking up  Likely related to dizziness  Denies vomiting   Dizziness States she woke up with dizziness States it is worse with looking side to side States she feels like she is spinning  Reports ear fullness in left side and denies hearing loss   Back pain with radiation to her chest States pain is reproducible in back and chest States pain is now on both sides of her back and bilateral shoulders Denies pain in chest with breathing States she was told to take Tylenol for chest and back pain at the ED States her back pain feels better if she slouches over forward      Past Medical History:  Diagnosis Date   Anemia    Arthritis    Asthma    GERD (gastroesophageal reflux disease)    Headache    Hypertension    Iron deficiency anemia 12/05/2018   Wears contact lenses     Past Surgical History:  Procedure Laterality Date   ABDOMINAL HYSTERECTOMY  2012   CHOLECYSTECTOMY     COLONOSCOPY WITH PROPOFOL N/A 12/18/2019   Procedure: COLONOSCOPY WITH PROPOFOL;  Surgeon: Lin Landsman, MD;  Location: [place];  Service: Gastroenterology;  Laterality: N/A;   DILATION AND CURETTAGE OF UTERUS     ESOPHAGOGASTRODUODENOSCOPY (EGD) WITH PROPOFOL N/A 03/18/2020   Procedure: ESOPHAGOGASTRODUODENOSCOPY (EGD) WITH PROPOFOL;  Surgeon: Lin Landsman, MD;  Location[facility]  ENDOSCOPY;  Service: Gastroenterology;  Laterality: N/A;   GASTRIC BYPASS  2005   INDUCED ABORTION N/A 1997   patient was about 22 weeks   POLYPECTOMY  12/18/2019   Procedure: POLYPECTOMY;  Surgeon: Lin Landsman, MD;  Location: [place];  Service: Gastroenterology;;   SHOULDER ARTHROSCOPY WITH OPEN ROTATOR CUFF REPAIR Left 06/14/2016   Procedure: SHOULDER ARTHROSCOPY WITH OPEN ROTATOR CUFF REPAIR, DECOMPRESSION, EXCISION LOOSE BODY, DEBRIDEMENT;  Surgeon: Corky Mull, MD;  Location: [facility];  Service: Orthopedics;  Laterality: Left;    Family History  Problem Relation Age of Onset   Hypertension Mother    Cancer Father        prostate   Heart disease Brother    Breast cancer Cousin 40       mat cousin    Social History   Socioeconomic History   Marital status: Single    Spouse name: Not on file   Number of children: 1   Years of education: Not on file   Highest education level: Not on file  Occupational History   Not on file  Tobacco Use   Smoking status: Never   Smokeless tobacco: Never  Vaping Use   Vaping Use: Never used  Substance and Sexual Activity   Alcohol use: No    Alcohol/week: 0.0 standard drinks  Drug use: No   Sexual activity: Not Currently  Other Topics Concern   Not on file  Social History Narrative   Desk job/works for insurance; no smoking; no alcohol; lives in [place].    Social Determinants of Health   Financial Resource Strain: Not on file  Food Insecurity: Not on file  Transportation Needs: Not on file  Physical Activity: Not on file  Stress: Not on file  Social Connections: Not on file  Intimate Partner Violence: Not on file    Outpatient Medications Prior to Visit  Medication Sig Dispense Refill   acetaminophen (TYLENOL) 325 MG tablet Take 325 mg by mouth every 6 (six) hours as needed. PRN     albuterol (VENTOLIN HFA) 108 (90 Base) MCG/ACT inhaler Inhale 2 puffs into the lungs every 6 (six) hours as needed for  wheezing or shortness of breath. 18 g 0   amLODipine (NORVASC) 2.5 MG tablet Take 1 tablet (2.5 mg total) by mouth daily. 90 tablet 1   fluticasone (FLONASE) 50 MCG/ACT nasal spray SHAKE LIQUID AND USE 2 SPRAYS IN EACH NOSTRIL DAILY 48 g 0   loratadine (CLARITIN) 10 MG tablet Take 1 tablet (10 mg total) by mouth daily. 90 tablet 1   nystatin cream (MYCOSTATIN) Apply 1 application topically 2 (two) times daily. 30 g 5   omeprazole (PRILOSEC) 40 MG capsule TAKE 1 CAPSULE(40 MG) BY MOUTH TWICE DAILY BEFORE MEALS 180 capsule 0   pregabalin (LYRICA) 25 MG capsule Take 1 capsule (25 mg total) by mouth 3 (three) times daily. 90 capsule 0   QUEtiapine (SEROQUEL) 25 MG tablet Take 1 tablet (25 mg total) by mouth at bedtime. In place of trazodone for sleep 90 tablet 0   rosuvastatin (CRESTOR) 10 MG tablet Take 1 tablet (10 mg total) by mouth daily. 90 tablet 1   triamterene-hydrochlorothiazide (MAXZIDE-25) 37.5-25 MG tablet Take 1 tablet by mouth daily. In place of HCTZ 25 , BP medication 90 tablet 1   Vitamin D, Ergocalciferol, (DRISDOL) 1.25 MG (50000 UNIT) CAPS capsule TAKE 1 CAPSULE BY MOUTH EVERY 7 DAYS 12 capsule 0   No facility-administered medications prior to visit.    Allergies  Allergen Reactions   Lisinopril Swelling    Facial Swelling   Ace Inhibitors     angioedema   Codeine Swelling   Valsartan Swelling    Review of Systems  Eyes:  Negative for photophobia and visual disturbance.  Respiratory:  Positive for shortness of breath.   Cardiovascular:  Positive for chest pain.  Gastrointestinal:  Positive for nausea and vomiting.  Musculoskeletal:  Positive for back pain.  Neurological:  Positive for dizziness and light-headedness.      Objective:    Physical Exam Vitals reviewed.  Constitutional:      Appearance: Normal appearance. She is obese.  HENT:     Head: Normocephalic and atraumatic.     Right Ear: Hearing, ear canal and external ear normal. Tympanic membrane is not  erythematous, retracted or bulging.     Left Ear: Hearing, ear canal and external ear normal. A middle ear effusion is present. Tympanic membrane is not erythematous, retracted or bulging.  Eyes:     General: Lids are normal.     Extraocular Movements: Extraocular movements intact.     Right eye: No nystagmus.     Left eye: No nystagmus.     Conjunctiva/sclera: Conjunctivae normal.     Pupils: Pupils are equal, round, and reactive to light.  Cardiovascular:  Rate and Rhythm: Normal rate and regular rhythm.     Pulses: Normal pulses.     Heart sounds: Normal heart sounds.  Pulmonary:     Effort: Pulmonary effort is normal.     Breath sounds: Normal breath sounds. No decreased breath sounds, wheezing, rhonchi or rales.  Musculoskeletal:     Right shoulder: Normal. Normal range of motion. Normal strength.     Left shoulder: Normal. Normal range of motion. Normal strength.     Right elbow: Normal range of motion.     Left elbow: Normal range of motion.     Right hand: Normal capillary refill. Normal pulse.     Left hand: Normal capillary refill. Normal pulse.     Cervical back: Normal range of motion.       Back:     Comments: 5/5 strength at shoulders, elbows   Neurological:     Mental Status: She is alert.    BP (!) 168/90    Pulse 60    Temp 97.7 F (36.5 C) (Oral)    Resp 18    Ht 5\' 7"  (1.702 m)    Wt 271 lb 11.2 oz (123.2 kg)    SpO2 97%    BMI 42.55 kg/m  Wt Readings from Last 3 Encounters:  12/04/21 271 lb 11.2 oz (123.2 kg)  12/01/21 270 lb (122.5 kg)  11/29/21 273 lb (123.8 kg)    Health Maintenance Due  Topic Date Due   COVID-19 Vaccine (5 - Booster for Moderna series) 07/26/2021   Zoster Vaccines- Shingrix (2 of 2) 10/02/2021   MAMMOGRAM  11/23/2021    There are no preventive care reminders to display for this patient.   Lab Results  Component Value Date   TSH 1.68 11/03/2018   Lab Results  Component Value Date   WBC 6.2 12/01/2021   HGB 12.5  12/01/2021   HCT 36.9 12/01/2021   MCV 91.3 12/01/2021   PLT 236 12/01/2021   Lab Results  Component Value Date   NA 138 12/01/2021   K 3.9 12/01/2021   CO2 25 12/01/2021   GLUCOSE 86 12/01/2021   BUN 12 12/01/2021   CREATININE 0.89 12/01/2021   BILITOT 0.4 10/20/2020   ALKPHOS 98 07/24/2019   AST 12 10/20/2020   ALT 9 10/20/2020   PROT 6.1 10/20/2020   ALBUMIN 3.9 07/24/2019   CALCIUM 9.0 12/01/2021   ANIONGAP 9 12/01/2021   Lab Results  Component Value Date   CHOL 161 10/20/2020   Lab Results  Component Value Date   HDL 62 10/20/2020   Lab Results  Component Value Date   LDLCALC 78 10/20/2020   Lab Results  Component Value Date   TRIG 131 10/20/2020   Lab Results  Component Value Date   CHOLHDL 2.6 10/20/2020   Lab Results  Component Value Date   HGBA1C 5.9 (H) 10/20/2020       Assessment & Plan:     Problem List Items Addressed This Visit       Other   Dizziness - Primary    Acute, new problem, ongoing since this AM with associated nausea Provided instructions on Epley maneuver and discussed potential etiologies of dizziness that include, BPPV, otitis media with effusion, vertigo,  Cannot rule out acute manifestation of Meniere's disease at this time either Provided script for Meclizine to assist with dizziness        Relevant Medications   meclizine (ANTIVERT) 12.5 MG tablet   Notalgia  Acute, new problem, not resolving  Patient was recently seen in ED for back pain with radiation to the chest and down left arm Cardiovascular etiology was overall ruled out with ED work up  Based on symptoms and PE I suspect this is musculoskeletal in nature with potential etiology due to posture while working Provided stretches and instructions for resting the area to assist with discomfort Provided meloxicam to assist with pain and inflammation Recommend follow up in 3-4 weeks if symptoms are not improving with conservative management for imaging and  referral to PT for evaluation and further treatment.        Relevant Medications   meloxicam (MOBIC) 15 MG tablet     No follow-ups on file.   I, Narciso Stoutenburg E Lakelynn Severtson, PA-C, have reviewed all documentation for this visit. The documentation on 12/04/21 for the exam, diagnosis, procedures, and orders are all accurate and complete.   Wyndell Cardiff, [place]ie Isle MPH [facility]   No orders of the defined types were placed in this encounter.    Gresia Isidoro E Chaylee Ehrsam, PA-C

## 2021-12-04 NOTE — Assessment & Plan Note (Signed)
Acute, new problem, not resolving  Patient was recently seen in ED for back pain with radiation to the chest and down left arm Cardiovascular etiology was overall ruled out with ED work up  Based on symptoms and PE I suspect this is musculoskeletal in nature with potential etiology due to posture while working Provided stretches and instructions for resting the area to assist with discomfort Provided meloxicam to assist with pain and inflammation Recommend follow up in 3-4 weeks if symptoms are not improving with conservative management for imaging and referral to PT for evaluation and further treatment.

## 2021-12-04 NOTE — Patient Instructions (Signed)
Nausea and dizziness: this is potentially being caused by something in your inner ear. Your eardrum looks like it may have a mild effusion and this can cause some of your dizziness If it is not caused by that, the Epley maneuver should help to reset the otoliths I was telling you about and help relieve your symptoms Please take the Meclizine as needed to help reduce your dizziness as well  Your back and shoulder pain appears to be musculoskeletal in nature. I recommend focusing on correct posture, gentle stretching and use of NSAIDs at this time to help reduce inflammation and symptoms If the pain and discomfort are not improving after doing these things for a few weeks please let us know and we can take further action   It was nice to meet you and I appreciate the opportunity to be involved in your care

## 2021-12-04 NOTE — Assessment & Plan Note (Signed)
Acute, new problem, ongoing since this AM with associated nausea Provided instructions on Epley maneuver and discussed potential etiologies of dizziness that include, BPPV, otitis media with effusion, vertigo,  Cannot rule out acute manifestation of Meniere's disease at this time either Provided script for Meclizine to assist with dizziness

## 2021-12-04 NOTE — Telephone Encounter (Signed)
Pt has an appt today with Erin Mecum °

## 2021-12-04 NOTE — Telephone Encounter (Signed)
° ° °  Chief Complaint: Back pain between shoulders Symptoms: Nausea, dizziness. Seen in ED 12/01/21. Negative for cardiac. Frequency: Started last week. Pertinent Negatives: Patient denies  Disposition: [] ED /[] Urgent Care (no appt availability in office) / [x] Appointment(In office/virtual)/ []  [facility]/ [] Home Care/ [] Refused Recommended Disposition /[]  Mobile Bus/ []  Follow-up with PCP Additional Notes: Spoke with in practice, may schedule appointment today. Instructed pt. To call 911 for worsening of symptoms.    Reason for Disposition  Patient sounds very sick or weak to the triager  Answer Assessment - Initial Assessment Questions 1. LOCATION: "Where does it hurt?"       Shoulder on left 2. RADIATION: "Does the pain go anywhere else?" (e.g., into neck, jaw, arms, back)     Right shoulder 3. ONSET: "When did the chest pain begin?" (Minutes, hours or days)      12/01/21 4. PATTERN "Does the pain come and go, or has it been constant since it started?"  "Does it get worse with exertion?"      Comes and goes 5. DURATION: "How long does it last" (e.g., seconds, minutes, hours)     A couple of minutes 6. SEVERITY: "How bad is the pain?"  (e.g., Scale 1-10; mild, moderate, or severe)    - MILD (1-3): doesn't interfere with normal activities     - MODERATE (4-7): interferes with normal activities or awakens from sleep    - SEVERE (8-10): excruciating pain, unable to do any normal activities       6 7. CARDIAC RISK FACTORS: "Do you have any history of heart problems or risk factors for heart disease?" (e.g., angina, prior heart attack; diabetes, high blood pressure, high cholesterol, smoker, or strong family history of heart disease)     No 8. PULMONARY RISK FACTORS: "Do you have any history of lung disease?"  (e.g., blood clots in lung, asthma, emphysema, birth control pills)     No 9. CAUSE: "What do you think is causing the chest pain?"     Unsure 10.  OTHER SYMPTOMS: "Do you have any other symptoms?" (e.g., dizziness, nausea, vomiting, sweating, fever, difficulty breathing, cough)       Nausea, dizziness 11. PREGNANCY: "Is there any chance you are pregnant?" "When was your last menstrual period?"       No  Protocols used: Chest Pain-A-AH

## 2021-12-05 ENCOUNTER — Other Ambulatory Visit: Payer: Self-pay

## 2021-12-13 ENCOUNTER — Other Ambulatory Visit: Payer: Self-pay | Admitting: Family Medicine

## 2021-12-13 DIAGNOSIS — E785 Hyperlipidemia, unspecified: Secondary | ICD-10-CM

## 2021-12-13 NOTE — Telephone Encounter (Signed)
Requested medication (s) are due for refill today: yes  Requested medication (s) are on the active medication list: yes  Last refill:  05/03/21 #90 with 1 RF  Future visit scheduled: 01/23/22  Notes to clinic:  Failed protocol of labs within 360 days, lipids from 10/2020, has upcoming appt, please assess.   Requested Prescriptions  Pending Prescriptions Disp Refills   rosuvastatin (CRESTOR) 10 MG tablet [Pharmacy Med Name: ROSUVASTATIN 10MG  TABLETS] 90 tablet 1    Sig: TAKE 1 TABLET(10 MG) BY MOUTH DAILY     Cardiovascular:  Antilipid - Statins 2 Failed - 12/13/2021  6:22 AM      Failed - Lipid Panel in normal range within the last 12 months    Cholesterol, Total  Date Value Ref Range Status  05/04/2016 205 (H) 100 - 199 mg/dL Final   Cholesterol  Date Value Ref Range Status  10/20/2020 161 <200 mg/dL Final   LDL Cholesterol (Calc)  Date Value Ref Range Status  10/20/2020 78 mg/dL (calc) Final    Comment:    Reference range: <100 . Desirable range <100 mg/dL for primary prevention;   <70 mg/dL for patients with CHD or diabetic patients  with > or = 2 CHD risk factors. 10/22/2020 LDL-C is now calculated using the Martin-Hopkins  calculation, which is a validated novel method providing  better accuracy than the Friedewald equation in the  estimation of LDL-C.  [name] et al. Horald Pollen. Lenox Ahr): 2061-2068  (http://education.[facility].com/faq/FAQ164)    HDL  Date Value Ref Range Status  10/20/2020 62 > OR = 50 mg/dL Final  10/22/2020 64 27/25/3664 mg/dL Final   Triglycerides  Date Value Ref Range Status  10/20/2020 131 <150 mg/dL Final         Passed - Cr in normal range and within 360 days    Creat  Date Value Ref Range Status  10/20/2020 0.73 0.50 - 1.05 mg/dL Final    Comment:    For patients >34 years of age, the reference limit for Creatinine is approximately 13% higher for people identified as African-American. .    Creatinine, Ser  Date Value Ref Range  Status  12/01/2021 0.89 0.44 - 1.00 mg/dL Final          Passed - Patient is not pregnant      Passed - Valid encounter within last 12 months    Recent Outpatient Visits           1 week ago Dizziness   [facility] Mecum, Erin E, PA-C   4 months ago Morbid obesity with BMI of 40.0-44.9, adult [facility])   [facility], MD   5 months ago Foul smelling urine   [facility], FNP   5 months ago COVID-19   [facility], MD   7 months ago Morbid obesity with BMI of 40.0-44.9, adult [facility])   [facility], MD       Future Appointments             In 1 month Alba Cory, MD [facility]

## 2021-12-27 ENCOUNTER — Encounter: Payer: Self-pay | Admitting: Advanced Practice Midwife

## 2021-12-27 ENCOUNTER — Other Ambulatory Visit: Payer: Self-pay

## 2021-12-27 ENCOUNTER — Ambulatory Visit (INDEPENDENT_AMBULATORY_CARE_PROVIDER_SITE_OTHER): Payer: BC Managed Care – PPO | Admitting: Advanced Practice Midwife

## 2021-12-27 VITALS — BP 149/83 | Ht 67.0 in | Wt 274.0 lb

## 2021-12-27 DIAGNOSIS — Z0000 Encounter for general adult medical examination without abnormal findings: Secondary | ICD-10-CM | POA: Diagnosis not present

## 2021-12-27 DIAGNOSIS — Z1239 Encounter for other screening for malignant neoplasm of breast: Secondary | ICD-10-CM

## 2021-12-27 NOTE — Progress Notes (Signed)
Gynecology Annual Exam  PCP: Steele Sizer, MD  Chief Complaint:  Chief Complaint  Patient presents with   Annual Exam    dysuria    History of Present Illness:Patient is a 61 y.o. G2P0011 presents for annual exam. The patient has no gyn complaints today. She mentions some pain with urination in the past day and admits has not hydrated well recently. She does admit some vaginal dryness and hot flashes and denies need for treatment. She has acid reflux  episodes approximately every 2 weeks and has ongoing burning/chest pain/wheezing from most recent episode. She has been prescribed omeprazole and will follow up with PCP.   LMP: No LMP recorded. Patient has had a hysterectomy.   The patient is not sexually active. She denies dyspareunia.  The patient does perform self breast exams.  There is no notable family history of breast or ovarian cancer in her family.  The patient wears seatbelts: yes.   The patient has regular exercise:  minimal currently due to arthritis in knees, she admits not eating regular meals, she has decreased sweets and does eat fried foods which she thinks may be contributing to her acid reflux. She usually has 4-6 hours of sleep .  We discussed the importance of improving healthy lifestyle; for healthy weight and overall well being. Anti- inflammatory lifestyle recommended to help decrease arthritis symptoms.   The patient denies current symptoms of depression.     Review of Systems: Review of Systems  Constitutional:  Negative for chills and fever.  HENT:  Negative for congestion, ear discharge, ear pain, hearing loss, sinus pain and sore throat.   Eyes:  Negative for blurred vision and double vision.  Respiratory:  Positive for shortness of breath and wheezing. Negative for cough.   Cardiovascular:  Positive for chest pain. Negative for palpitations and leg swelling.  Gastrointestinal:  Negative for abdominal pain, blood in stool, constipation, diarrhea,  heartburn, melena, nausea and vomiting.  Genitourinary:  Positive for dysuria. Negative for flank pain, frequency, hematuria and urgency.  Musculoskeletal:  Positive for joint pain. Negative for back pain and myalgias.  Skin:  Negative for itching and rash.  Neurological:  Negative for dizziness, tingling, tremors, sensory change, speech change, focal weakness, seizures, loss of consciousness, weakness and headaches.  Endo/Heme/Allergies:  Negative for environmental allergies. Does not bruise/bleed easily.  Psychiatric/Behavioral:  Negative for depression, hallucinations, memory loss, substance abuse and suicidal ideas. The patient is not nervous/anxious and does not have insomnia.    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   Chronic pain of right knee 07/26/2017   Vitamin D deficiency 04/24/2017   B12 deficiency 04/24/2017   Abnormal mammogram of right breast 01/01/2017   Asthma, mild persistent 12/17/2016   History of shoulder surgery 10/12/2016    Left, rotator cuff repair    Primary osteoarthritis of left shoulder 06/14/2016   Incomplete tear of left rotator cuff 05/30/2016   Rotator cuff tendinitis 05/30/2016   History of bariatric surgery 05/04/2016   Allergic rhinitis 05/12/2015   Benign essential HTN 05/12/2015   Anxiety and depression 05/12/2015   Insomnia, persistent 05/12/2015  Dyslipidemia 05/12/2015    See lab work 04/2014    Edema extremities 05/12/2015   Gastroesophageal reflux disease without esophagitis 05/12/2015   Hypoglycemia 05/12/2015   Eczema intertrigo 05/12/2015   Migraine 05/12/2015    Plantar fasciitis [id number]   Umbilical hernia without obstruction and without gangrene 05/12/2015   Morbid obesity, unspecified obesity type ([place]) 02/13/2008    In 2002 she underwent gastric bypass surgery in [place] at her greastest weight of 314lbs. After the surgery she had managed to reduce her weight to 164lbs. Over time she has gained some weight back slowly.     Past Surgical History:  Past Surgical History:  Procedure Laterality Date   ABDOMINAL HYSTERECTOMY  2012   CHOLECYSTECTOMY     COLONOSCOPY WITH PROPOFOL N/A 12/18/2019   Procedure: COLONOSCOPY WITH PROPOFOL;  Surgeon: Lin Landsman, MD;  Location: [place];  Service: Gastroenterology;  Laterality: N/A;   DILATION AND CURETTAGE OF UTERUS     ESOPHAGOGASTRODUODENOSCOPY (EGD) WITH PROPOFOL N/A 03/18/2020   Procedure: ESOPHAGOGASTRODUODENOSCOPY (EGD) WITH PROPOFOL;  Surgeon: Lin Landsman, MD;  [facility];  Service: Gastroenterology;  Laterality: N/A;   GASTRIC BYPASS  2005   INDUCED ABORTION N/A 1997   patient was about 22 weeks   POLYPECTOMY  12/18/2019   Procedure: POLYPECTOMY;  Surgeon: Lin Landsman, MD;  Location: [place];  Service: Gastroenterology;;   SHOULDER ARTHROSCOPY WITH OPEN ROTATOR CUFF REPAIR Left 06/14/2016   Procedure: SHOULDER ARTHROSCOPY WITH OPEN ROTATOR CUFF REPAIR, DECOMPRESSION, EXCISION LOOSE BODY, DEBRIDEMENT;  Surgeon: Corky Mull, MD;  Location: [facility];  Service: Orthopedics;  Laterality: Left;    Gynecologic History:  No LMP recorded. Patient has had a hysterectomy. Last Pap: 2022 Results were:  no abnormalities  Last mammogram: 1 year ago Results were: BI-RAD I  Obstetric History: G59P0011  Family History:  Family History  Problem Relation Age of Onset   Hypertension Mother    Cancer Father        prostate   Heart disease Brother    Breast cancer Cousin 61       mat cousin    Social History:  Social History    Socioeconomic History   Marital status: Single    Spouse name: Not on file   Number of children: 1   Years of education: Not on file   Highest education level: Not on file  Occupational History   Not on file  Tobacco Use   Smoking status: Never   Smokeless tobacco: Never  Vaping Use   Vaping Use: Never used  Substance and Sexual Activity   Alcohol use: No    Alcohol/week: 0.0 standard drinks   Drug use: No   Sexual activity: Not Currently  Other Topics Concern   Not on file  Social History Narrative   Desk job/works for insurance; no smoking; no alcohol; lives in [place].    Social Determinants of Health   Financial Resource Strain: Not on file  Food Insecurity: Not on file  Transportation Needs: Not on file  Physical Activity: Not on file  Stress: Not on file  Social Connections: Not on file  Intimate Partner Violence: Not on file    Allergies:  Allergies  Allergen Reactions   Lisinopril Swelling    Facial Swelling   Ace Inhibitors     angioedema   Codeine Swelling   Valsartan Swelling    Medications: Prior to Admission medications   Medication Sig Start Date End Date  Taking? Authorizing Provider  acetaminophen (TYLENOL) 325 MG tablet Take 325 mg by mouth every 6 (six) hours as needed. PRN   Yes [provider]  albuterol (VENTOLIN HFA) 108 (90 Base) MCG/ACT inhaler Inhale 2 puffs into the lungs every 6 (six) hours as needed for wheezing or shortness of breath. 01/18/21  Yes Sowles, Drue Stager, MD  amLODipine (NORVASC) 2.5 MG tablet Take 1 tablet (2.5 mg total) by mouth daily. 05/03/21  Yes Sowles, Drue Stager, MD  fluticasone (FLONASE) 50 MCG/ACT nasal spray SHAKE LIQUID AND USE 2 SPRAYS IN EACH NOSTRIL DAILY 01/18/21  Yes Sowles, Drue Stager, MD  loratadine (CLARITIN) 10 MG tablet Take 1 tablet (10 mg total) by mouth daily. 01/18/21  Yes Sowles, Drue Stager, MD  meclizine (ANTIVERT) 12.5 MG tablet Take 1 tablet (12.5 mg total) by mouth 3 (three) times daily as needed  for dizziness. 12/04/21  Yes Mecum, Erin E, PA-C  meloxicam (MOBIC) 15 MG tablet Take 1 tablet (15 mg total) by mouth daily. 12/04/21  Yes Mecum, Erin E, PA-C  nystatin cream (MYCOSTATIN) Apply 1 application topically 2 (two) times daily. 11/05/19  Yes Rod Can, CNM  omeprazole (PRILOSEC) 40 MG capsule TAKE 1 CAPSULE(40 MG) BY MOUTH TWICE DAILY BEFORE MEALS 09/19/21  Yes Vanga, Tally Due, MD  pregabalin (LYRICA) 25 MG capsule Take 1 capsule (25 mg total) by mouth 3 (three) times daily. 05/03/21  Yes Sowles, Drue Stager, MD  QUEtiapine (SEROQUEL) 25 MG tablet Take 1 tablet (25 mg total) by mouth at bedtime. In place of trazodone for sleep 08/07/21  Yes Sowles, Drue Stager, MD  rosuvastatin (CRESTOR) 10 MG tablet TAKE 1 TABLET(10 MG) BY MOUTH DAILY 12/13/21  Yes Sowles, Drue Stager, MD  triamterene-hydrochlorothiazide (MAXZIDE-25) 37.5-25 MG tablet Take 1 tablet by mouth daily. In place of HCTZ 25 , BP medication 05/03/21  Yes Sowles, Drue Stager, MD  Vitamin D, Ergocalciferol, (DRISDOL) 1.25 MG (50000 UNIT) CAPS capsule TAKE 1 CAPSULE BY MOUTH EVERY 7 DAYS 11/28/21  Yes Steele Sizer, MD    Physical Exam Vitals: Blood pressure (!) 149/83, height 5\' 7"  (1.702 m), weight 274 lb (124.3 kg).  General: NAD HEENT: normocephalic, anicteric Thyroid: no enlargement, no palpable nodules Pulmonary: No increased work of breathing, CTAB Cardiovascular: RRR, distal pulses 2+ Breast: Breast symmetrical, no tenderness, no palpable nodules or masses, no skin or nipple retraction present, no nipple discharge.  No axillary or supraclavicular lymphadenopathy. Abdomen: NABS, soft, non-tender, non-distended.  Umbilicus without lesions.  No hepatomegaly, splenomegaly or masses palpable. No evidence of hernia  Genitourinary:  External: Normal external female genitalia.  Normal urethral meatus, normal Bartholin's and Skene's glands.    Vagina: Normal vaginal mucosa, no evidence of prolapse.    Cervix: Grossly normal in appearance,  no bleeding  Uterus: Non-enlarged, mobile, normal contour.  No CMT  Adnexa: ovaries non-enlarged, no adnexal masses  Rectal: deferred  Lymphatic: no evidence of inguinal lymphadenopathy Extremities: no edema, erythema, or tenderness Neurologic: Grossly intact Psychiatric: mood appropriate, affect full  Female chaperone present for pelvic and breast  portions of the physical exam     Assessment: 61 y.o. G2P0011 routine annual exam  Plan: Problem List Items Addressed This Visit   None Visit Diagnoses     Well woman exam without gynecological exam    -  Primary   Breast screening       Relevant Orders   MM 3D SCREEN BREAST BILATERAL       1) Mammogram - recommend yearly screening mammogram.  Mammogram Was ordered today  2) STI screening  was offered and declined  3) ASCCP guidelines and rationale discussed.  Patient opts for every 5 years screening interval  4) Osteoporosis  - per USPTF routine screening DEXA at age 32  Consider FDA-approved medical therapies in postmenopausal women and men aged 3 years and older, based on the following: a) A hip or vertebral (clinical or morphometric) fracture b) T-score ? -2.5 at the femoral neck or spine after appropriate evaluation to exclude secondary causes C) Low bone mass (T-score between -1.0 and -2.5 at the femoral neck or spine) and a 10-year probability of a hip fracture ? 3% or a 10-year probability of a major osteoporosis-related fracture ? 20% based on the [place]-adapted WHO algorithm   5) Routine healthcare maintenance including cholesterol, diabetes screening discussed managed by PCP  6) Colonoscopy is up to date and due in 2031.  Screening recommended starting at age 19 for average risk individuals, age 76 for individuals deemed at increased risk (including African Americans) and recommended to continue until age 41.  For patient age 57-85 individualized approach is recommended.  Gold standard screening is via colonoscopy,  Cologuard screening is an acceptable alternative for patient unwilling or unable to undergo colonoscopy.  "Colorectal cancer screening for average?risk adults: 2018 guideline update from the American Cancer Society"CA: A Cancer Journal for Clinicians: Apr 03, 2017   7) Increase healthy lifestyle; diet, exercise, hydration, sleep, stress reduction  8) Follow up with PCP/GI as needed for acid reflux  9) Return in about 1 year (around 12/27/2022) for annual established gyn.    Christean Leaf, CNM [facility] 12/27/21, 10:20 AM

## 2021-12-27 NOTE — Patient Instructions (Signed)
Mediterranean Diet °A Mediterranean diet refers to food and lifestyle choices that are based on the traditions of countries located on the [place]. It focuses on eating more fruits, vegetables, whole grains, beans, nuts, seeds, and heart-healthy fats, and eating less dairy, meat, eggs, and processed foods with added sugar, salt, and fat. This way of eating has been shown to help prevent certain conditions and improve outcomes for people who have chronic diseases, like kidney disease and heart disease. °What are tips for following this plan? °Reading food labels °Check the serving size of packaged foods. For foods such as rice and pasta, the serving size refers to the amount of cooked product, not dry. °Check the total fat in packaged foods. Avoid foods that have saturated fat or trans fats. °Check the ingredient list for added sugars, such as corn syrup. °Shopping ° °Buy a variety of foods that offer a balanced diet, including: °Fresh fruits and vegetables (produce). °Grains, beans, nuts, and seeds. Some of these may be available in unpackaged forms or large amounts (in bulk). °Fresh seafood. °Poultry and eggs. °Low-fat dairy products. °Buy whole ingredients instead of prepackaged foods. °Buy fresh fruits and vegetables in-season from local farmers markets. °Buy plain frozen fruits and vegetables. °If you do not have access to quality fresh seafood, buy precooked frozen shrimp or canned fish, such as tuna, salmon, or sardines. °Stock your pantry so you always have certain foods on hand, such as olive oil, canned tuna, canned tomatoes, rice, pasta, and beans. °Cooking °Cook foods with extra-virgin olive oil instead of using butter or other vegetable oils. °Have meat as a side dish, and have vegetables or grains as your main dish. This means having meat in small portions or adding small amounts of meat to foods like pasta or stew. °Use beans or vegetables instead of meat in common dishes like chili or  lasagna. °Experiment with different cooking methods. Try roasting, broiling, steaming, and sautéing vegetables. °Add frozen vegetables to soups, stews, pasta, or rice. °Add nuts or seeds for added healthy fats and plant protein at each meal. You can add these to yogurt, salads, or vegetable dishes. °Marinate fish or vegetables using olive oil, lemon juice, garlic, and fresh herbs. °Meal planning °Plan to eat one vegetarian meal one day each week. Try to work up to two vegetarian meals, if possible. °Eat seafood two or more times a week. °Have healthy snacks readily available, such as: °Vegetable sticks with hummus. °Greek yogurt. °Fruit and nut trail mix. °Eat balanced meals throughout the week. This includes: °Fruit: 2-3 servings a day. °Vegetables: 4-5 servings a day. °Low-fat dairy: 2 servings a day. °Fish, poultry, or lean meat: 1 serving a day. °Beans and legumes: 2 or more servings a week. °Nuts and seeds: 1-2 servings a day. °Whole grains: 6-8 servings a day. °Extra-virgin olive oil: 3-4 servings a day. °Limit red meat and sweets to only a few servings a month. °Lifestyle ° °Cook and eat meals together with your family, when possible. °Drink enough fluid to keep your urine pale yellow. °Be physically active every day. This includes: °Aerobic exercise like running or swimming. °Leisure activities like gardening, walking, or housework. °Get 7-8 hours of sleep each night. °If recommended by your health care provider, drink red wine in moderation. This means 1 glass a day for nonpregnant women and 2 glasses a day for men. A glass of wine equals 5 oz (150 mL). °What foods should I eat? °Fruits °Apples. Apricots. Avocado. Berries. Bananas. Cherries. Dates.   Figs. Grapes. Lemons. Melon. Oranges. Peaches. Plums. Pomegranate. °Vegetables °Artichokes. Beets. Broccoli. Cabbage. Carrots. Eggplant. Green beans. Chard. Kale. Spinach. Onions. Leeks. Peas. Squash. Tomatoes. Peppers. Radishes. °Grains °Whole-grain pasta. Brown  rice. Bulgur wheat. Polenta. Couscous. Whole-wheat bread. Oatmeal. Quinoa. °Meats and other proteins °Beans. Almonds. Sunflower seeds. Pine nuts. Peanuts. Cod. Salmon. Scallops. Shrimp. Tuna. Tilapia. Clams. Oysters. Eggs. Poultry without skin. °Dairy °Low-fat milk. Cheese. Greek yogurt. °Fats and oils °Extra-virgin olive oil. Avocado oil. Grapeseed oil. °Beverages °Water. Red wine. Herbal tea. °Sweets and desserts °Greek yogurt with honey. Baked apples. Poached pears. Trail mix. °Seasonings and condiments °Basil. Cilantro. Coriander. Cumin. Mint. Parsley. Sage. Rosemary. Tarragon. Garlic. Oregano. Thyme. Pepper. Balsamic vinegar. Tahini. Hummus. Tomato sauce. Olives. Mushrooms. °The items listed above may not be a complete list of foods and beverages you can eat. Contact a dietitian for more information. °What foods should I limit? °This is a list of foods that should be eaten rarely or only on special occasions. °Fruits °Fruit canned in syrup. °Vegetables °Deep-fried potatoes (french fries). °Grains °Prepackaged pasta or rice dishes. Prepackaged cereal with added sugar. Prepackaged snacks with added sugar. °Meats and other proteins °Beef. Pork. Lamb. Poultry with skin. Hot dogs. Bacon. °Dairy °Ice cream. Sour cream. Whole milk. °Fats and oils °Butter. Canola oil. Vegetable oil. Beef fat (tallow). Lard. °Beverages °Juice. Sugar-sweetened soft drinks. Beer. Liquor and spirits. °Sweets and desserts °Cookies. Cakes. Pies. Candy. °Seasonings and condiments °Mayonnaise. Pre-made sauces and marinades. °The items listed above may not be a complete list of foods and beverages you should limit. Contact a dietitian for more information. °Summary °The Mediterranean diet includes both food and lifestyle choices. °Eat a variety of fresh fruits and vegetables, beans, nuts, seeds, and whole grains. °Limit the amount of red meat and sweets that you eat. °If recommended by your health care provider, drink red wine in moderation.  This means 1 glass a day for nonpregnant women and 2 glasses a day for men. A glass of wine equals 5 oz (150 mL). °This information is not intended to replace advice given to you by your health care provider. Make sure you discuss any questions you have with your health care provider. °Document Revised: 11/27/2019 Document Reviewed: 09/24/2019 °Elsevier Patient Education © 2022 [employer] °Health Maintenance, Female °Adopting a healthy lifestyle and getting preventive care are important in promoting health and wellness. Ask your health care provider about: °The right schedule for you to have regular tests and exams. °Things you can do on your own to prevent diseases and keep yourself healthy. °What should I know about diet, weight, and exercise? °Eat a healthy diet ° °Eat a diet that includes plenty of vegetables, fruits, low-fat dairy products, and lean protein. °Do not eat a lot of foods that are high in solid fats, added sugars, or sodium. °Maintain a healthy weight °Body mass index (BMI) is used to identify weight problems. It estimates body fat based on height and weight. Your health care provider can help determine your BMI and help you achieve or maintain a healthy weight. °Get regular exercise °Get regular exercise. This is one of the most important things you can do for your health. Most adults should: °Exercise for at least 150 minutes each week. The exercise should increase your heart rate and make you sweat (moderate-intensity exercise). °Do strengthening exercises at least twice a week. This is in addition to the moderate-intensity exercise. °Spend less time sitting. Even light physical activity can be beneficial. °Watch cholesterol and blood lipids °Have   your blood tested for lipids and cholesterol at 59 years of age, then have this test every 5 years. °Have your cholesterol levels checked more often if: °Your lipid or cholesterol levels are high. °You are older than 59 years of age. °You are at  high risk for heart disease. °What should I know about cancer screening? °Depending on your health history and family history, you may need to have cancer screening at various ages. This may include screening for: °Breast cancer. °Cervical cancer. °Colorectal cancer. °Skin cancer. °Lung cancer. °What should I know about heart disease, diabetes, and high blood pressure? °Blood pressure and heart disease °High blood pressure causes heart disease and increases the risk of stroke. This is more likely to develop in people who have high blood pressure readings or are overweight. °Have your blood pressure checked: °Every 3-5 years if you are 18-39 years of age. °Every year if you are 40 years old or older. °Diabetes °Have regular diabetes screenings. This checks your fasting blood sugar level. Have the screening done: °Once every three years after age 40 if you are at a normal weight and have a low risk for diabetes. °More often and at a younger age if you are overweight or have a high risk for diabetes. °What should I know about preventing infection? °Hepatitis B °If you have a higher risk for hepatitis B, you should be screened for this virus. Talk with your health care provider to find out if you are at risk for hepatitis B infection. °Hepatitis C °Testing is recommended for: °Everyone born from 1945 through 1965. °Anyone with known risk factors for hepatitis C. °Sexually transmitted infections (STIs) °Get screened for STIs, including gonorrhea and chlamydia, if: °You are sexually active and are younger than 59 years of age. °You are older than 59 years of age and your health care provider tells you that you are at risk for this type of infection. °Your sexual activity has changed since you were last screened, and you are at increased risk for chlamydia or gonorrhea. Ask your health care provider if you are at risk. °Ask your health care provider about whether you are at high risk for HIV. Your health care provider may  recommend a prescription medicine to help prevent HIV infection. If you choose to take medicine to prevent HIV, you should first get tested for HIV. You should then be tested every 3 months for as long as you are taking the medicine. °Pregnancy °If you are about to stop having your period (premenopausal) and you may become pregnant, seek counseling before you get pregnant. °Take 400 to 800 micrograms (mcg) of folic acid every day if you become pregnant. °Ask for birth control (contraception) if you want to prevent pregnancy. °Osteoporosis and menopause °Osteoporosis is a disease in which the bones lose minerals and strength with aging. This can result in bone fractures. If you are 65 years old or older, or if you are at risk for osteoporosis and fractures, ask your health care provider if you should: °Be screened for bone loss. °Take a calcium or vitamin D supplement to lower your risk of fractures. °Be given hormone replacement therapy (HRT) to treat symptoms of menopause. °Follow these instructions at home: °Alcohol use °Do not drink alcohol if: °Your health care provider tells you not to drink. °You are pregnant, may be pregnant, or are planning to become pregnant. °If you drink alcohol: °Limit how much you have to: °0-1 drink a day. °Know how much alcohol is   in your drink. In the [place] one drink equals one 12 oz bottle of beer (355 mL), one 5 oz glass of wine (148 mL), or one 1½ oz glass of hard liquor (44 mL). °Lifestyle °Do not use any products that contain nicotine or tobacco. These products include cigarettes, chewing tobacco, and vaping devices, such as e-cigarettes. If you need help quitting, ask your health care provider. °Do not use street drugs. °Do not share needles. °Ask your health care provider for help if you need support or information about quitting drugs. °General instructions °Schedule regular health, dental, and eye exams. °Stay current with your vaccines. °Tell your health care provider  if: °You often feel depressed. °You have ever been abused or do not feel safe at home. °Summary °Adopting a healthy lifestyle and getting preventive care are important in promoting health and wellness. °Follow your health care provider's instructions about healthy diet, exercising, and getting tested or screened for diseases. °Follow your health care provider's instructions on monitoring your cholesterol and blood pressure. °This information is not intended to replace advice given to you by your health care provider. Make sure you discuss any questions you have with your health care provider. °Document Revised: 03/13/2021 Document Reviewed: 03/13/2021 °Elsevier Patient Education © 2022 Elsevier Inc. ° °

## 2021-12-29 ENCOUNTER — Inpatient Hospital Stay: Payer: BC Managed Care – PPO | Attending: Oncology

## 2021-12-29 ENCOUNTER — Other Ambulatory Visit: Payer: Self-pay

## 2021-12-29 DIAGNOSIS — E538 Deficiency of other specified B group vitamins: Secondary | ICD-10-CM | POA: Diagnosis not present

## 2021-12-29 MED ORDER — CYANOCOBALAMIN 1000 MCG/ML IJ SOLN
1000.0000 ug | Freq: Once | INTRAMUSCULAR | Status: AC
  Administered 2021-12-29: 1000 ug via INTRAMUSCULAR
  Filled 2021-12-29: qty 1

## 2022-01-17 ENCOUNTER — Ambulatory Visit
Admission: RE | Admit: 2022-01-17 | Discharge: 2022-01-17 | Disposition: A | Discharge status: Home / Self Care | Payer: BC Managed Care – PPO | Source: Ambulatory Visit | Attending: Advanced Practice Midwife | Admitting: Advanced Practice Midwife

## 2022-01-17 ENCOUNTER — Other Ambulatory Visit: Payer: Self-pay

## 2022-01-17 DIAGNOSIS — Z1231 Encounter for screening mammogram for malignant neoplasm of breast: Secondary | ICD-10-CM | POA: Insufficient documentation

## 2022-01-17 DIAGNOSIS — Z1239 Encounter for other screening for malignant neoplasm of breast: Secondary | ICD-10-CM

## 2022-01-22 NOTE — Progress Notes (Signed)
Name: Savannah Manning   MRN: [id number]    DOB: 01-Sep-1961   Date:01/23/2022 ? ?     Progress Note ? ?Subjective ? ?Chief Complaint ? ?Follow Up ? ?HPI ? ?Dysthymia/insomnia:  She has been taking Seroquel prn only now, sleeping better.  She tried duloxetine but unable to tolerate it, it caused nausea. She denies suicidal thoughts or ideation . ? ?GERD: taking Omeprazole and under the care of Dr. Allegra Lai , symptoms are controlled with medication and life style modification  ?  ?Obesity/prediabetes: .  She has been obese since childhood. She has tried multiple diets in the past, she had gastric bypass in 2005. Weight before surgery was 314 lbs, she went down to 164 lbs, but has been gradually gaining weight. She states her weight has gone up since she started to work from home in  2010.  She had her surgery done at [facility], and was denied for a revision in the Summer 2018.  She has failed Metformin, started her on Ozempic  04/2017 her weight was 259 lbs, her weight went as low as 239 lbs. Her insurance has denied Ozempic, only paying for patients that have DM. She states is not covering Saxenda again, we gave rx of Contrave Dec 2021 but she stopped due to cost. She gained more weight since last visit , weight was stable around  267 lbs but is up to 2789 lbs now.. We gave her Reginal Lutes and the sample worked, but the rx was too expensive, today she is willing to try taking Saxenda , since covered by insurance. She has not joined [employer] yet  ?  ?Chronic knee / left hip pain/ left rotator cuff tendinitis: she has seen Dr. Joice Lofts in the past , last visit was for knee pain, reviewed X-ray done 04/2020 that showed bilateral knee OA.  she used to take Meloxicam but off medication because of history of bariatric surgery, but has been using topical nsaid's voltaren gel. Pain on left hip is getting worse - affecting her sleep. She does not want steroid injections. We gave her Duloxetine and caused nausea. She took Lyrica but weaned  self off since it was not helping  . Recently also noticing pain on right wrist and right index finger  ?  ?History of iron deficiency anemia and B12 deficiency: she is under the care of Dr. Merlene Pulling and is getting monthly B12. She is doing well in terms of iron storage ?  ?HTN:  She denies chest pain or palpitation. She is on Maxzide and norvasc , bp is at goal today continue current regiment. Explained Norvasc may cause edema, however cannot take ARB/ACE or a beta blocker due to bradycardia and allergies. We will wait for her to see cardiologist and let them adjust medications if appropriate  ? ?Vitamin D deficiency: continue rx vitamin D. Last level was at goal .  ? ?Dumping syndrome: she states doing well most of the time, only triggered by fatty meals ? ?Lower extremity edema: she has been sitting longer since July 2022 due to change of her position at work. Not walking as much as she used to in her previous position. She states a few weeks ago she noticed ankle swelling towards the end of the day. She has SOB with activity but present for a long time ( it may be multifactorial)  She is not sure if she has orthopnea because she cannot lay flat due to GERD. She went to [facility] with chest pain end  of January, had EKG , CXR and troponin and diagnosed with chest wall pain. There is a significant family history of heart disease, older brother died at 2949 with MI, younger brother also had heart surgery - she thinks a coronary tear  ?  ?Patient Active Problem List  ? Diagnosis Date Noted  ? Dizziness 12/04/2021  ? Notalgia 12/04/2021  ? Mild intermittent asthma without complication 10/20/2020  ? Dysthymia 10/20/2020  ? Primary osteoarthritis of both knees 10/20/2020  ? Primary osteoarthritis of right knee 04/06/2020  ? Primary osteoarthritis of left knee 04/06/2020  ? Pre-diabetes 12/05/2018  ? Malabsorption of iron 11/07/2018  ? Chronic pain of right knee 07/26/2017  ? Vitamin D deficiency 04/24/2017  ? B12 deficiency  04/24/2017  ? Abnormal mammogram of right breast 01/01/2017  ? Asthma, mild persistent 12/17/2016  ? History of shoulder surgery 10/12/2016  ? Primary osteoarthritis of left shoulder 06/14/2016  ? Incomplete tear of left rotator cuff 05/30/2016  ? Rotator cuff tendinitis 05/30/2016  ? History of bariatric surgery 05/04/2016  ? Allergic rhinitis 05/12/2015  ? Benign essential HTN 05/12/2015  ? Anxiety and depression 05/12/2015  ? Insomnia, persistent 05/12/2015  ? Dyslipidemia 05/12/2015  ? Edema extremities 05/12/2015  ? Gastroesophageal reflux disease without esophagitis 05/12/2015  ? Hypoglycemia 05/12/2015  ? Eczema intertrigo 05/12/2015  ? Migraine 05/12/2015  ? Plantar fasciitis 05/12/2015  ? Umbilical hernia without obstruction and without gangrene 05/12/2015  ? Morbid obesity, unspecified obesity type (HCC) 02/13/2008  ? ? ?Past Surgical History:  ?Procedure Laterality Date  ? ABDOMINAL HYSTERECTOMY  2012  ? CHOLECYSTECTOMY    ? COLONOSCOPY WITH PROPOFOL N/A 12/18/2019  ? Procedure: COLONOSCOPY WITH PROPOFOL;  Surgeon: Toney ReilVanga, Rohini Reddy, MD;  [facility];  Service: Gastroenterology;  Laterality: N/A;  ? DILATION AND CURETTAGE OF UTERUS    ? ESOPHAGOGASTRODUODENOSCOPY (EGD) WITH PROPOFOL N/A 03/18/2020  ? Procedure: ESOPHAGOGASTRODUODENOSCOPY (EGD) WITH PROPOFOL;  Surgeon: Toney ReilVanga, Rohini Reddy, MD;  Location[facility];  Service: Gastroenterology;  Laterality: N/A;  ? GASTRIC BYPASS  2005  ? INDUCED ABORTION N/A 1997  ? patient was about 22 weeks  ? POLYPECTOMY  12/18/2019  ? Procedure: POLYPECTOMY;  Surgeon: Toney ReilVanga, Rohini Reddy, MD;  [facility];  Service: Gastroenterology;;  ? SHOULDER ARTHROSCOPY WITH OPEN ROTATOR CUFF REPAIR Left 06/14/2016  ? Procedure: SHOULDER ARTHROSCOPY WITH OPEN ROTATOR CUFF REPAIR, DECOMPRESSION, EXCISION LOOSE BODY, DEBRIDEMENT;  Surgeon: Christena FlakeJohn J Poggi, MD;  Location: [facility];  Service: Orthopedics;  Laterality: Left;  ? ? ?Family History   ?Problem Relation Age of Onset  ? Hypertension Mother   ? Cancer Father   ?     prostate  ? Heart disease Brother   ? Breast cancer Cousin 40  ?     mat cousin  ? ? ?Social History  ? ?Tobacco Use  ? Smoking status: Never  ? Smokeless tobacco: Never  ?Substance Use Topics  ? Alcohol use: No  ?  Alcohol/week: 0.0 standard drinks  ? ? ? ?Current Outpatient Medications:  ?  acetaminophen (TYLENOL) 325 MG tablet, Take 325 mg by mouth every 6 (six) hours as needed. PRN, Disp: , Rfl:  ?  albuterol (VENTOLIN HFA) 108 (90 Base) MCG/ACT inhaler, Inhale 2 puffs into the lungs every 6 (six) hours as needed for wheezing or shortness of breath., Disp: 18 g, Rfl: 0 ?  amLODipine (NORVASC) 2.5 MG tablet, Take 1 tablet (2.5 mg total) by mouth daily., Disp: 90 tablet, Rfl: 1 ?  fluticasone (FLONASE) 50 MCG/ACT nasal spray, SHAKE LIQUID AND USE 2 SPRAYS IN EACH NOSTRIL DAILY, Disp: 48 g, Rfl: 0 ?  loratadine (CLARITIN) 10 MG tablet, Take 1 tablet (10 mg total) by mouth daily., Disp: 90 tablet, Rfl: 1 ?  meclizine (ANTIVERT) 12.5 MG tablet, Take 1 tablet (12.5 mg total) by mouth 3 (three) times daily as needed for dizziness., Disp: 30 tablet, Rfl: 0 ?  meloxicam (MOBIC) 15 MG tablet, Take 1 tablet (15 mg total) by mouth daily., Disp: 30 tablet, Rfl: 0 ?  nystatin cream (MYCOSTATIN), Apply 1 application topically 2 (two) times daily., Disp: 30 g, Rfl: 5 ?  omeprazole (PRILOSEC) 40 MG capsule, TAKE 1 CAPSULE(40 MG) BY MOUTH TWICE DAILY BEFORE MEALS, Disp: 180 capsule, Rfl: 0 ?  pregabalin (LYRICA) 25 MG capsule, Take 1 capsule (25 mg total) by mouth 3 (three) times daily., Disp: 90 capsule, Rfl: 0 ?  QUEtiapine (SEROQUEL) 25 MG tablet, Take 1 tablet (25 mg total) by mouth at bedtime. In place of trazodone for sleep, Disp: 90 tablet, Rfl: 0 ?  rosuvastatin (CRESTOR) 10 MG tablet, TAKE 1 TABLET(10 MG) BY MOUTH DAILY, Disp: 90 tablet, Rfl: 1 ?  triamterene-hydrochlorothiazide (MAXZIDE-25) 37.5-25 MG tablet, Take 1 tablet by mouth daily.  In place of HCTZ 25 , BP medication, Disp: 90 tablet, Rfl: 1 ?  Vitamin D, Ergocalciferol, (DRISDOL) 1.25 MG (50000 UNIT) CAPS capsule, TAKE 1 CAPSULE BY MOUTH EVERY 7 DAYS, Disp: 12 capsule, Rfl: 0 ? ?Allergie

## 2022-01-23 ENCOUNTER — Encounter: Payer: Self-pay | Admitting: Family Medicine

## 2022-01-23 ENCOUNTER — Ambulatory Visit (INDEPENDENT_AMBULATORY_CARE_PROVIDER_SITE_OTHER): Payer: BC Managed Care – PPO | Admitting: Family Medicine

## 2022-01-23 VITALS — BP 134/82 | HR 89 | Resp 16 | Ht 67.0 in | Wt 278.0 lb

## 2022-01-23 DIAGNOSIS — R7303 Prediabetes: Secondary | ICD-10-CM

## 2022-01-23 DIAGNOSIS — E538 Deficiency of other specified B group vitamins: Secondary | ICD-10-CM | POA: Diagnosis not present

## 2022-01-23 DIAGNOSIS — Z9884 Bariatric surgery status: Secondary | ICD-10-CM

## 2022-01-23 DIAGNOSIS — Z8249 Family history of ischemic heart disease and other diseases of the circulatory system: Secondary | ICD-10-CM

## 2022-01-23 DIAGNOSIS — I10 Essential (primary) hypertension: Secondary | ICD-10-CM | POA: Diagnosis not present

## 2022-01-23 DIAGNOSIS — K219 Gastro-esophageal reflux disease without esophagitis: Secondary | ICD-10-CM

## 2022-01-23 DIAGNOSIS — E785 Hyperlipidemia, unspecified: Secondary | ICD-10-CM

## 2022-01-23 DIAGNOSIS — E559 Vitamin D deficiency, unspecified: Secondary | ICD-10-CM | POA: Diagnosis not present

## 2022-01-23 DIAGNOSIS — Z6841 Body Mass Index (BMI) 40.0 and over, adult: Secondary | ICD-10-CM

## 2022-01-23 DIAGNOSIS — Z8639 Personal history of other endocrine, nutritional and metabolic disease: Secondary | ICD-10-CM

## 2022-01-23 DIAGNOSIS — J4520 Mild intermittent asthma, uncomplicated: Secondary | ICD-10-CM

## 2022-01-23 DIAGNOSIS — R0609 Other forms of dyspnea: Secondary | ICD-10-CM

## 2022-01-23 DIAGNOSIS — R600 Localized edema: Secondary | ICD-10-CM

## 2022-01-23 MED ORDER — AMLODIPINE BESYLATE 2.5 MG PO TABS: 2.5000 mg | ORAL_TABLET | Freq: Every day | ORAL | 1 refills | Status: DC

## 2022-01-23 MED ORDER — SAXENDA 18 MG/3ML ~~LOC~~ SOPN: 0.6000 mg | PEN_INJECTOR | Freq: Every day | SUBCUTANEOUS | 0 refills | Status: DC

## 2022-01-23 MED ORDER — VITAMIN D (ERGOCALCIFEROL) 1.25 MG (50000 UNIT) PO CAPS: 50000.0000 [IU] | ORAL_CAPSULE | ORAL | 1 refills | Status: DC

## 2022-01-23 MED ORDER — NOVOFINE PEN NEEDLE 32G X 6 MM MISC: 1.0000 | Freq: Every day | 1 refills | Status: DC

## 2022-01-23 MED ORDER — TRIAMTERENE-HCTZ 37.5-25 MG PO TABS: 1.0000 | ORAL_TABLET | Freq: Every day | ORAL | 1 refills | Status: DC

## 2022-01-24 ENCOUNTER — Inpatient Hospital Stay: Payer: BC Managed Care – PPO | Attending: Oncology

## 2022-01-24 ENCOUNTER — Other Ambulatory Visit: Payer: Self-pay

## 2022-01-24 DIAGNOSIS — E538 Deficiency of other specified B group vitamins: Secondary | ICD-10-CM | POA: Diagnosis not present

## 2022-01-24 LAB — COMPLETE METABOLIC PANEL WITH GFR
AG Ratio: 1.4 (calc) (ref 1.0–2.5)
ALT: 10 U/L (ref 6–29)
AST: 13 U/L (ref 10–35)
Albumin: 4 g/dL (ref 3.6–5.1)
Alkaline phosphatase (APISO): 99 U/L (ref 37–153)
BUN: 13 mg/dL (ref 7–25)
CO2: 28 mmol/L (ref 20–32)
Calcium: 9.2 mg/dL (ref 8.6–10.4)
Chloride: 103 mmol/L (ref 98–110)
Creat: 0.84 mg/dL (ref 0.50–1.03)
Globulin: 2.9 g/dL (calc) (ref 1.9–3.7)
Glucose, Bld: 84 mg/dL (ref 65–99)
Potassium: 4.6 mmol/L (ref 3.5–5.3)
Sodium: 139 mmol/L (ref 135–146)
Total Bilirubin: 0.6 mg/dL (ref 0.2–1.2)
Total Protein: 6.9 g/dL (ref 6.1–8.1)
eGFR: 80 mL/min/{1.73_m2} (ref >=60)

## 2022-01-24 LAB — HEMOGLOBIN A1C
Hgb A1c MFr Bld: 5.8 % of total Hgb — ABNORMAL HIGH (ref <5.7)
Mean Plasma Glucose: 120 mg/dL
eAG (mmol/L): 6.6 mmol/L

## 2022-01-24 LAB — LIPID PANEL
Cholesterol: 266 mg/dL — ABNORMAL HIGH (ref <200)
HDL: 68 mg/dL (ref >=50)
LDL Cholesterol (Calc): 171 mg/dL (calc) — ABNORMAL HIGH
Non-HDL Cholesterol (Calc): 198 mg/dL (calc) — ABNORMAL HIGH (ref <130)
Total CHOL/HDL Ratio: 3.9 (calc) (ref <5.0)
Triglycerides: 131 mg/dL (ref <150)

## 2022-01-24 MED ORDER — CYANOCOBALAMIN 1000 MCG/ML IJ SOLN
1000.0000 ug | Freq: Once | INTRAMUSCULAR | Status: AC
  Administered 2022-01-24: 1000 ug via INTRAMUSCULAR
  Filled 2022-01-24: qty 1

## 2022-01-30 ENCOUNTER — Encounter: Payer: Self-pay | Admitting: Family Medicine

## 2022-02-08 NOTE — Telephone Encounter (Signed)
Initiated new PA, awaiting determination.  ?

## 2022-02-09 ENCOUNTER — Ambulatory Visit (INDEPENDENT_AMBULATORY_CARE_PROVIDER_SITE_OTHER): Payer: BC Managed Care – PPO | Admitting: Cardiology

## 2022-02-09 ENCOUNTER — Encounter: Payer: Self-pay | Admitting: Cardiology

## 2022-02-09 VITALS — BP 126/82 | HR 62 | Ht 67.0 in | Wt 280.0 lb

## 2022-02-09 DIAGNOSIS — R0609 Other forms of dyspnea: Secondary | ICD-10-CM

## 2022-02-09 DIAGNOSIS — E785 Hyperlipidemia, unspecified: Secondary | ICD-10-CM

## 2022-02-09 DIAGNOSIS — E7800 Pure hypercholesterolemia, unspecified: Secondary | ICD-10-CM

## 2022-02-09 DIAGNOSIS — I10 Essential (primary) hypertension: Secondary | ICD-10-CM | POA: Diagnosis not present

## 2022-02-09 DIAGNOSIS — R600 Localized edema: Secondary | ICD-10-CM

## 2022-02-09 MED ORDER — FUROSEMIDE 20 MG PO TABS: 20.0000 mg | ORAL_TABLET | Freq: Every day | ORAL | 1 refills | Status: DC | PRN

## 2022-02-09 MED ORDER — ROSUVASTATIN CALCIUM 20 MG PO TABS: 20.0000 mg | ORAL_TABLET | Freq: Every day | ORAL | 2 refills | Status: DC

## 2022-02-09 NOTE — Patient Instructions (Signed)
Medication Instructions:  ?Your physician has recommended you make the following change in your medication:  ? ?INCREASE Rosuvastatin (Crestor) to 20 mg daily. An Rx has been sent to your pharmacy. ? ?START Furosemide (Lasix) 20 mg daily as needed for leg swelling. An RX has been sent to your pharmacy. ? ?*If you need a refill on your cardiac medications before your next appointment, please call your pharmacy* ? ? ?Lab Work: ?Your physician recommends that you return for a FASTING lipid profile: 6 weeks  ? ?Please have your lab drawn at the [facility]. No appt needed. Lab hours are M-F 7am-6pm. Stop at the Registration desk to check in. ? ?If you have labs (blood work) drawn today and your tests are completely normal, you will receive your results only by: ?MyChart Message (if you have MyChart) OR ?A paper copy in the mail ?If you have any lab test that is abnormal or we need to change your treatment, we will call you to review the results. ? ? ?Testing/Procedures: ?Your physician has requested that you have an echocardiogram. Echocardiography is a painless test that uses sound waves to create images of your heart. It provides your doctor with information about the size and shape of your heart and how well your heart?s chambers and valves are working. This procedure takes approximately one hour. There are no restrictions for this procedure. ? ?Your physician has requested that you have a lexiscan myoview. For further information please visit [email or web address] Please follow instruction sheet, as given.  ? ? ?Follow-Up: ?At [facility], you and your health needs are our priority.  As part of our continuing mission to provide you with exceptional heart care, we have created designated Provider Care Teams.  These Care Teams include your primary Cardiologist (physician) and Advanced Practice Providers (APPs -  Physician Assistants and Nurse Practitioners) who all work together to provide you with the care  you need, when you need it. ? ?We recommend signing up for the patient portal called "MyChart".  Sign up information is provided on this After Visit Summary.  MyChart is used to connect with patients for Virtual Visits (Telemedicine).  Patients are able to view lab/test results, encounter notes, upcoming appointments, etc.  Non-urgent messages can be sent to your provider as well.   ?To learn more about what you can do with MyChart, go to [email or web address].   ? ?Your next appointment:   ?After testing  ? ?The format for your next appointment:   ?In Person ? ?Provider:   ?You may see Debbe Odea, MD or one of the following Advanced Practice Providers on your designated Care Team:   ?Nicolasa Ducking, NP ?Eula Listen, PA-C ?Cadence [name], PA-C{ ? ? ? ?Other Instructions ?[facility] MYOVIEW ? ?Your caregiver has ordered a Stress Test with nuclear imaging. The purpose of this test is to evaluate the blood supply to your heart muscle. This procedure is referred to as a "Non-Invasive Stress Test." This is because other than having an IV started in your vein, nothing is inserted or "invades" your body. Cardiac stress tests are done to find areas of poor blood flow to the heart by determining the extent of coronary artery disease (CAD). Some patients exercise on a treadmill, which naturally increases the blood flow to your heart, while others who are  unable to walk on a treadmill due to physical limitations have a pharmacologic/chemical stress agent called Lexiscan . This medicine will mimic walking on a treadmill by temporarily increasing your  coronary blood flow.  ? ?Please note: these test may take anywhere between 2-4 hours to complete ? ?PLEASE REPORT TO [facility] MEDICAL MALL ENTRANCE  ?THE VOLUNTEERS AT THE FIRST DESK WILL DIRECT YOU WHERE TO GO ? ?Date of Procedure:_____________________________________ ? ?Arrival Time for Procedure:______________________________ ? ?Instructions regarding medication:  ? ? ?__X__:   Hold other medications as follows: ? ?DO NOT Take Lasix or HCTZ the morning of the test. ? ? ?PLEASE NOTIFY THE OFFICE AT LEAST 24 HOURS IN ADVANCE IF YOU ARE UNABLE TO KEEP YOUR APPOINTMENT.  [phone] ?AND  ?PLEASE NOTIFY NUCLEAR MEDICINE AT [facility] AT LEAST 24 HOURS IN ADVANCE IF YOU ARE UNABLE TO KEEP YOUR APPOINTMENT. [phone] ? ?How to prepare for your Myoview test: ? ?Do not eat or drink after midnight ?No caffeine for 24 hours prior to test ?No smoking 24 hours prior to test. ?Your medication may be taken with water.  If your doctor stopped a medication because of this test, do not take that medication. ?Ladies, please do not wear dresses.  Skirts or pants are appropriate. Please wear a short sleeve shirt. ?No perfume or lotion. ?Wear comfortable walking shoes. No heels! ? ? ? ? ? ?

## 2022-02-09 NOTE — Addendum Note (Signed)
Addended by: Lamar Laundry on: 02/09/2022 02:31 PM ? ? Modules accepted: Orders ? ?

## 2022-02-09 NOTE — Progress Notes (Signed)
?Cardiology Office Note:   ? ?Date:  02/09/2022  ? ?ID:  Savannah Manning, DOB 01-15-1961, MRN [id number] ? ?PCP:  Alba Cory, MD ?  ?[facility] Providers ?Cardiologist:  None    ? ?Referring MD: Alba Cory, MD  ? ?Chief Complaint  ?Patient presents with  ? New Patient (Initial Visit)  ?  Referred by PCP for Bilateral lower extremity edema, Family history of heart disease in female family member before age 59- Dyspnea on exertion . Meds reviewed verbally with patient.   ? ?Savannah Manning is a 61 y.o. female who is being seen today for the evaluation of leg edema at the request of Alba Cory, MD. ? ? ?History of Present Illness:   ? ?Savannah Manning is a 61 y.o. female with a hx of hypertension, hyperlipidemia, obesity, family history of early CAD who presents due to dyspnea on exertion and leg edema. ? ?Patient has noticed worsening lower extremity edema ongoing over the past 2 months.  Swelling in her legs usually worsen as the day progresses.  Also endorsed shortness of breath with exertion, heart sounds less P couple of weeks ago with radiation to her chest.  She was worried about this, and wanted to get checked out due to significant family history of early CAD.  Brother had MI age 55, another brother had coronary artery bypass in his 28s. ? ?Past Medical History:  ?Diagnosis Date  ? Anemia   ? Arthritis   ? Asthma   ? GERD (gastroesophageal reflux disease)   ? Headache   ? Hypertension   ? Iron deficiency anemia 12/05/2018  ? Wears contact lenses   ? ? ?Past Surgical History:  ?Procedure Laterality Date  ? ABDOMINAL HYSTERECTOMY  2012  ? CHOLECYSTECTOMY    ? COLONOSCOPY WITH PROPOFOL N/A 12/18/2019  ? Procedure: COLONOSCOPY WITH PROPOFOL;  Surgeon: Toney Reil, MD;  Location: [facility];  Service: Gastroenterology;  Laterality: N/A;  ? DILATION AND CURETTAGE OF UTERUS    ? ESOPHAGOGASTRODUODENOSCOPY (EGD) WITH PROPOFOL N/A 03/18/2020  ? Procedure: ESOPHAGOGASTRODUODENOSCOPY (EGD) WITH  PROPOFOL;  Surgeon: Toney Reil, MD;  Location: [facility];  Service: Gastroenterology;  Laterality: N/A;  ? GASTRIC BYPASS  2005  ? INDUCED ABORTION N/A 1997  ? patient was about 22 weeks  ? POLYPECTOMY  12/18/2019  ? Procedure: POLYPECTOMY;  Surgeon: Toney Reil, MD;  Location: [facility];  Service: Gastroenterology;;  ? SHOULDER ARTHROSCOPY WITH OPEN ROTATOR CUFF REPAIR Left 06/14/2016  ? Procedure: SHOULDER ARTHROSCOPY WITH OPEN ROTATOR CUFF REPAIR, DECOMPRESSION, EXCISION LOOSE BODY, DEBRIDEMENT;  Surgeon: Christena Flake, MD;  Location: [facility];  Service: Orthopedics;  Laterality: Left;  ? ? ?Current Medications: ?Current Meds  ?Medication Sig  ? acetaminophen (TYLENOL) 325 MG tablet Take 325 mg by mouth every 6 (six) hours as needed. PRN  ? albuterol (VENTOLIN HFA) 108 (90 Base) MCG/ACT inhaler Inhale 2 puffs into the lungs every 6 (six) hours as needed for wheezing or shortness of breath.  ? amLODipine (NORVASC) 2.5 MG tablet Take 1 tablet (2.5 mg total) by mouth daily.  ? fluticasone (FLONASE) 50 MCG/ACT nasal spray SHAKE LIQUID AND USE 2 SPRAYS IN EACH NOSTRIL DAILY  ? furosemide (LASIX) 20 MG tablet Take 1 tablet (20 mg total) by mouth daily as needed. For leg swelling  ? Insulin Pen Needle (NOVOFINE PEN NEEDLE) 32G X 6 MM MISC 1 each by Does not apply route daily at 12 noon.  ? Liraglutide -Weight  Management (SAXENDA) 18 MG/3ML SOPN Inject 0.6-3 mg into the skin daily.  ? loratadine (CLARITIN) 10 MG tablet Take 1 tablet (10 mg total) by mouth daily.  ? nystatin cream (MYCOSTATIN) Apply 1 application topically 2 (two) times daily.  ? omeprazole (PRILOSEC) 40 MG capsule TAKE 1 CAPSULE(40 MG) BY MOUTH TWICE DAILY BEFORE MEALS  ? triamterene-hydrochlorothiazide (MAXZIDE-25) 37.5-25 MG tablet Take 1 tablet by mouth daily. In place of HCTZ 25 , BP medication  ? Vitamin D, Ergocalciferol, (DRISDOL) 1.25 MG (50000 UNIT) CAPS capsule Take 1 capsule (50,000 Units total) by mouth every 7  (seven) days.  ? [DISCONTINUED] rosuvastatin (CRESTOR) 10 MG tablet TAKE 1 TABLET(10 MG) BY MOUTH DAILY  ?  ? ?Allergies:   Ace inhibitors, Codeine, and Valsartan  ? ?Social History  ? ?Socioeconomic History  ? Marital status: Single  ?  Spouse name: Not on file  ? Number of children: 1  ? Years of education: Not on file  ? Highest education level: Not on file  ?Occupational History  ? Not on file  ?Tobacco Use  ? Smoking status: Never  ? Smokeless tobacco: Never  ?Vaping Use  ? Vaping Use: Never used  ?Substance and Sexual Activity  ? Alcohol use: No  ?  Alcohol/week: 0.0 standard drinks  ? Drug use: No  ? Sexual activity: Not Currently  ?Other Topics Concern  ? Not on file  ?Social History Narrative  ? Desk job/works for insurance; no smoking; no alcohol; lives in [place].   ? ?Social Determinants of Health  ? ?Financial Resource Strain: Not on file  ?Food Insecurity: Not on file  ?Transportation Needs: Not on file  ?Physical Activity: Not on file  ?Stress: Not on file  ?Social Connections: Not on file  ?  ? ?Family History: ?The patient's family history includes Breast cancer (age of onset: 2540) in her cousin; Cancer in her father; Heart disease in her brother; Hypertension in her mother. ? ?ROS:   ?Please see the history of present illness.    ? All other systems reviewed and are negative. ? ?EKGs/Labs/Other Studies Reviewed:   ? ?The following studies were reviewed today: ? ? ?EKG:  EKG is  ordered today.  The ekg ordered today demonstrates normal sinus rhythm, normal ECG. ? ?Recent Labs: ?12/01/2021: Hemoglobin 12.5; Platelets 236 ?01/23/2022: ALT 10; BUN 13; Creat 0.84; Potassium 4.6; Sodium 139  ?Recent Lipid Panel ?   ?Component Value Date/Time  ? CHOL 266 (H) 01/23/2022 1534  ? CHOL 205 (H) 05/04/2016 1053  ? TRIG 131 01/23/2022 1534  ? HDL 68 01/23/2022 1534  ? HDL 64 05/04/2016 1053  ? CHOLHDL 3.9 01/23/2022 1534  ? VLDL 21 05/01/2017 0819  ? LDLCALC 171 (H) 01/23/2022 1534  ? ? ? ?Risk  Assessment/Calculations:   ? ? ?    ? ?Physical Exam:   ? ?VS:  BP 126/82 (BP Location: Left Arm, Patient Position: Sitting, Cuff Size: Large)   Pulse 62   Ht 5\' 7"  (1.702 m)   Wt 280 lb (127 kg)   SpO2 98%   BMI 43.85 kg/m?    ? ?Wt Readings from Last 3 Encounters:  ?02/09/22 280 lb (127 kg)  ?01/23/22 278 lb (126.1 kg)  ?12/27/21 274 lb (124.3 kg)  ?  ? ?GEN:  Well nourished, well developed in no acute distress ?HEENT: Normal ?NECK: No JVD; No carotid bruits ?LYMPHATICS: No lymphadenopathy ?CARDIAC: RRR, no murmurs, rubs, gallops ?RESPIRATORY:  Clear to auscultation without rales, wheezing or  rhonchi  ?ABDOMEN: Soft, non-tender, non-distended ?MUSCULOSKELETAL:  1+ edema; No deformity  ?SKIN: Warm and dry ?NEUROLOGIC:  Alert and oriented x 3 ?PSYCHIATRIC:  Normal affect  ? ?ASSESSMENT:   ? ?1. Dyspnea on exertion   ?2. Primary hypertension   ?3. Pure hypercholesterolemia   ?4. Morbid obesity (HCC)   ?5. Bilateral leg edema   ?6. Dyslipidemia   ? ?PLAN:   ? ?In order of problems listed above: ? ?Dyspnea on exertion, this could be an anginal equivalent.  Family history of early CAD, risk factors hypertension, hyperlipidemia.  Get echocardiogram, get Lexiscan Myoview to evaluate ischemia. ?Hypertension, BP controlled.  Continue HCTZ, Norvasc. ?Hyperlipidemia, cholesterol not controlled, increase Crestor to 20 mg daily.  Repeat lipid panel in 6 weeks. ?Morbid obesity, likely contributing to shortness of breath, low-calorie diet, weight loss advised. ?Bilateral leg edema, obesity likely contributing.  Edema appears dependent.  Lasix 20 mg daily as needed. ? ?Follow-up after echo and Myoview. ? ?   ? ?Shared Decision Making/Informed Consent ?The risks [chest pain, shortness of breath, cardiac arrhythmias, dizziness, blood pressure fluctuations, myocardial infarction, stroke/transient ischemic attack, nausea, vomiting, allergic reaction, radiation exposure, metallic taste sensation and life-threatening complications  (estimated to be 1 in 10,000)], benefits (risk stratification, diagnosing coronary artery disease, treatment guidance) and alternatives of a nuclear stress test were discussed in detail with Ms. Gloster and she agrees to proceed.

## 2022-02-12 ENCOUNTER — Telehealth: Payer: Self-pay

## 2022-02-12 ENCOUNTER — Other Ambulatory Visit: Payer: Self-pay | Admitting: Nurse Practitioner

## 2022-02-12 MED ORDER — SAXENDA 18 MG/3ML ~~LOC~~ SOPN: 0.6000 mg | PEN_INJECTOR | Freq: Every day | SUBCUTANEOUS | 0 refills | Status: DC

## 2022-02-12 NOTE — Telephone Encounter (Signed)
Pt returned call requesting to speak to Dois Davenport, please advise. She says the pharmacist says he never spoke to anyone from the office today and that she is missing instructions for the prescription. Please advise ? ?Best contact: [phone] ?

## 2022-02-12 NOTE — Telephone Encounter (Signed)
Spoke with Pharmacist and she stated approval went through, they were having computer issues on Friday, Pt notified medication is ready and PA was approved.  ?

## 2022-02-12 NOTE — Telephone Encounter (Unsigned)
Copied from CRM [id number]. Topic: Quick Communication - Rx Refill/Question ?>> Feb 09, 2022  3:29 PM Pawlus, Maxine Glenn A wrote: ?Pt called in and stated the PA for Liraglutide -Weight Management (SAXENDA) 18 MG/3ML SOPN was denied, pt wanted a call back to see what else can be done. ?

## 2022-02-14 ENCOUNTER — Other Ambulatory Visit: Payer: Self-pay

## 2022-02-14 ENCOUNTER — Ambulatory Visit: Payer: BC Managed Care – PPO

## 2022-02-15 ENCOUNTER — Other Ambulatory Visit: Payer: BC Managed Care – PPO

## 2022-02-15 NOTE — Telephone Encounter (Signed)
Completed.

## 2022-02-16 NOTE — Addendum Note (Signed)
Addended by: Kate Sable on: 02/16/2022 08:07 AM ? ? Modules accepted: Orders ? ?

## 2022-02-19 ENCOUNTER — Encounter: Payer: Self-pay | Admitting: Family Medicine

## 2022-02-19 ENCOUNTER — Ambulatory Visit: Payer: Self-pay

## 2022-02-19 NOTE — Telephone Encounter (Signed)
?  Pt seeking clarification of titrating dose of med. Please verify the instructions given to pt (source: [employer]) pharmacy needs clarification as well. ? ? ? ? ? Obesity, Or overweight in the presence of at least one weight-related comorbidity ?[facility])) Initial, 0.6 mg subQ once daily for 1 week; increase weekly in increments of 0.6 mg/day until maintenance dosage of 3 mg once daily is reached. Evaluate weight loss after 16 weeks; if at least 4% of body weight has not been lost, discontinue use as continued treatment is not likely to be effective ?Reason for Disposition ? Caller has medicine question only, adult not sick, AND triager answers question ? ?Answer Assessment - Initial Assessment Questions ?1. NAME of MEDICATION: "What medicine are you calling about?" ?    Saxenda ?2. QUESTION: "What is your question?" (e.g., double dose of medicine, side effect) ?   Instruction on titrating ?3. PRESCRIBING HCP: "Who prescribed it?" Reason: if prescribed by specialist, call should be referred to that group. ?    PCP ?4. SYMPTOMS: "Do you have any symptoms?" ?    N/a ?5. SEVERITY: If symptoms are present, ask "Are they mild, moderate or severe?" ?    N/A ?6. PREGNANCY:  "Is there any chance that you are pregnant?" "When was your last menstrual period?" ? ?Protocols used: Medication Question Call-A-AH ? ?

## 2022-02-19 NOTE — Telephone Encounter (Signed)
Pt said that she does not an appt she just needs to know about the doses on how much to take.Asked that there nurse give her a call please.

## 2022-02-19 NOTE — Telephone Encounter (Signed)
Patient said that all she needs is to know the dosage she is to take . Says the pharmacy did not know or see it anywhere. Please call her asnd advise. Thanks

## 2022-02-22 ENCOUNTER — Ambulatory Visit
Admission: RE | Admit: 2022-02-22 | Discharge: 2022-02-22 | Disposition: A | Discharge status: Home / Self Care | Payer: BC Managed Care – PPO | Source: Ambulatory Visit | Attending: Cardiology | Admitting: Cardiology

## 2022-02-22 DIAGNOSIS — R0609 Other forms of dyspnea: Secondary | ICD-10-CM | POA: Diagnosis not present

## 2022-02-22 MED ORDER — TECHNETIUM TC 99M TETROFOSMIN IV KIT
30.0000 | PACK | Freq: Once | INTRAVENOUS | Status: AC | PRN
  Administered 2022-02-22: 32.72 via INTRAVENOUS

## 2022-02-22 MED ORDER — REGADENOSON 0.4 MG/5ML IV SOLN
0.4000 mg | Freq: Once | INTRAVENOUS | Status: AC
  Administered 2022-02-22: 0.4 mg via INTRAVENOUS
  Filled 2022-02-22: qty 5

## 2022-02-22 MED ORDER — TECHNETIUM TC 99M TETROFOSMIN IV KIT
10.2000 | PACK | Freq: Once | INTRAVENOUS | Status: AC | PRN
  Administered 2022-02-22: 10.2 via INTRAVENOUS

## 2022-02-23 LAB — NM MYOCAR MULTI W/SPECT W/WALL MOTION / EF
LV dias vol: 80 mL (ref 46–106)
LV sys vol: 23 mL
Nuc Stress EF: 71 %
Peak HR: 88 {beats}/min
Percent HR: 54 %
Rest HR: 56 {beats}/min
Rest Nuclear Isotope Dose: 10.2 mCi
SDS: 5
SRS: 4
SSS: 3
ST Depression (mm): 0 mm
Stress Nuclear Isotope Dose: 32.7 mCi
TID: 0.96

## 2022-02-28 ENCOUNTER — Inpatient Hospital Stay: Payer: BC Managed Care – PPO | Attending: Oncology

## 2022-02-28 ENCOUNTER — Inpatient Hospital Stay: Payer: BC Managed Care – PPO

## 2022-02-28 ENCOUNTER — Other Ambulatory Visit: Payer: BC Managed Care – PPO

## 2022-02-28 ENCOUNTER — Ambulatory Visit (INDEPENDENT_AMBULATORY_CARE_PROVIDER_SITE_OTHER): Payer: BC Managed Care – PPO

## 2022-02-28 DIAGNOSIS — E538 Deficiency of other specified B group vitamins: Secondary | ICD-10-CM | POA: Diagnosis not present

## 2022-02-28 DIAGNOSIS — R0609 Other forms of dyspnea: Secondary | ICD-10-CM

## 2022-02-28 DIAGNOSIS — K909 Intestinal malabsorption, unspecified: Secondary | ICD-10-CM

## 2022-02-28 LAB — CBC WITH DIFFERENTIAL/PLATELET
Abs Immature Granulocytes: 0.02 10*3/uL (ref 0.00–0.07)
Basophils Absolute: 0 10*3/uL (ref 0.0–0.1)
Basophils Relative: 0 %
Eosinophils Absolute: 0.1 10*3/uL (ref 0.0–0.5)
Eosinophils Relative: 2 %
HCT: 38.4 % (ref 36.0–46.0)
Hemoglobin: 13.1 g/dL (ref 12.0–15.0)
Immature Granulocytes: 0 %
Lymphocytes Relative: 28 %
Lymphs Abs: 1.5 10*3/uL (ref 0.7–4.0)
MCH: 30.4 pg (ref 26.0–34.0)
MCHC: 34.1 g/dL (ref 30.0–36.0)
MCV: 89.1 fL (ref 80.0–100.0)
Monocytes Absolute: 0.5 10*3/uL (ref 0.1–1.0)
Monocytes Relative: 9 %
Neutro Abs: 3.4 10*3/uL (ref 1.7–7.7)
Neutrophils Relative %: 61 %
Platelets: 227 10*3/uL (ref 150–400)
RBC: 4.31 MIL/uL (ref 3.87–5.11)
RDW: 12.4 % (ref 11.5–15.5)
WBC: 5.5 10*3/uL (ref 4.0–10.5)
nRBC: 0 % (ref 0.0–0.2)

## 2022-02-28 LAB — ECHOCARDIOGRAM COMPLETE
AR max vel: 2.94 cm2
AV Area VTI: 2.72 cm2
AV Area mean vel: 2.6 cm2
AV Mean grad: 4 mmHg
AV Peak grad: 7.5 mmHg
Ao pk vel: 1.37 m/s
Area-P 1/2: 4.93 cm2
Calc EF: 67.2 %
S' Lateral: 3.2 cm
Single Plane A2C EF: 67.4 %
Single Plane A4C EF: 67 %

## 2022-02-28 LAB — IRON AND TIBC
Iron: 80 ug/dL (ref 28–170)
Saturation Ratios: 19 % (ref 10.4–31.8)
TIBC: 426 ug/dL (ref 250–450)
UIBC: 346 ug/dL

## 2022-02-28 LAB — FERRITIN: Ferritin: 23 ng/mL (ref 11–307)

## 2022-02-28 MED ORDER — CYANOCOBALAMIN 1000 MCG/ML IJ SOLN
1000.0000 ug | Freq: Once | INTRAMUSCULAR | Status: AC
  Administered 2022-02-28: 1000 ug via INTRAMUSCULAR
  Filled 2022-02-28: qty 1

## 2022-03-28 ENCOUNTER — Inpatient Hospital Stay: Payer: BC Managed Care – PPO | Attending: Oncology

## 2022-03-28 ENCOUNTER — Ambulatory Visit (INDEPENDENT_AMBULATORY_CARE_PROVIDER_SITE_OTHER): Payer: BC Managed Care – PPO | Admitting: Medical

## 2022-03-28 ENCOUNTER — Encounter: Payer: Self-pay | Admitting: Medical

## 2022-03-28 VITALS — BP 134/64 | HR 56 | Ht 67.0 in | Wt 274.0 lb

## 2022-03-28 DIAGNOSIS — R600 Localized edema: Secondary | ICD-10-CM | POA: Diagnosis not present

## 2022-03-28 DIAGNOSIS — R0609 Other forms of dyspnea: Secondary | ICD-10-CM | POA: Diagnosis not present

## 2022-03-28 DIAGNOSIS — I10 Essential (primary) hypertension: Secondary | ICD-10-CM | POA: Diagnosis not present

## 2022-03-28 DIAGNOSIS — I2510 Atherosclerotic heart disease of native coronary artery without angina pectoris: Secondary | ICD-10-CM

## 2022-03-28 DIAGNOSIS — E782 Mixed hyperlipidemia: Secondary | ICD-10-CM

## 2022-03-28 DIAGNOSIS — E538 Deficiency of other specified B group vitamins: Secondary | ICD-10-CM | POA: Diagnosis not present

## 2022-03-28 DIAGNOSIS — I2584 Coronary atherosclerosis due to calcified coronary lesion: Secondary | ICD-10-CM

## 2022-03-28 MED ORDER — CYANOCOBALAMIN 1000 MCG/ML IJ SOLN
1000.0000 ug | Freq: Once | INTRAMUSCULAR | Status: AC
  Administered 2022-03-28: 1000 ug via INTRAMUSCULAR
  Filled 2022-03-28: qty 1

## 2022-03-28 MED ORDER — ASPIRIN 81 MG PO TBEC: 81.0000 mg | DELAYED_RELEASE_TABLET | Freq: Every day | ORAL | Status: DC

## 2022-03-28 MED ORDER — ROSUVASTATIN CALCIUM 40 MG PO TABS: 40.0000 mg | ORAL_TABLET | Freq: Every day | ORAL | 2 refills | Status: DC

## 2022-03-28 NOTE — Progress Notes (Signed)
Cardiology Office Note:    Date:  03/28/2022   ID:  [name], DOB Dec 18, 1960, MRN [id number]  PCP:  Alba Cory, MD  [facility] Cardiologist:  None  [facility] Electrophysiologist:  None   Referring MD: Alba Cory, MD   Chief Complaint: 6 week follow-up  History of Present Illness:    Savannah Manning is a 61 y.o. female with a hx of HTN, HLD, obesity, family h/o early CAD who presents for follow-up for echocardiogram and Myoview Lexiscan.   Seen as a new patient 02/09/22 for LLE and dyspnea. Echo and lexiscan were ordered. She was started on lasix 20mg  daily and Crestor.   Echo showed LVEF 55-60%, no WMA, mild MR. Myoview Lexiscan showed no significant ischemia,CT imaging with mild coronary calcification, low risk scan.   Today, the patient reports she has been doing well. She has lost some weight, about 6 lbs. She is walking daily. Diet is fairly healthy. Still gets out of breath on exertion, however it seems a little better. No chest pain. LLE at the end of day. She is elevating her feet more. Echo and Stress test were discussed in detail.   Past Medical History:  Diagnosis Date   Anemia    Arthritis    Asthma    GERD (gastroesophageal reflux disease)    Headache    Hypertension    Iron deficiency anemia 12/05/2018   Wears contact lenses     Past Surgical History:  Procedure Laterality Date   ABDOMINAL HYSTERECTOMY  2012   CHOLECYSTECTOMY     COLONOSCOPY WITH PROPOFOL N/A 12/18/2019   Procedure: COLONOSCOPY WITH PROPOFOL;  Surgeon: 02/15/2020, MD;  Location: [facility];  Service: Gastroenterology;  Laterality: N/A;   DILATION AND CURETTAGE OF UTERUS     ESOPHAGOGASTRODUODENOSCOPY (EGD) WITH PROPOFOL N/A 03/18/2020   Procedure: ESOPHAGOGASTRODUODENOSCOPY (EGD) WITH PROPOFOL;  Surgeon: 03/20/2020, MD;  Location: [facility] END[facility];  Service: Gastroenterology;  Laterality: N/A;   GASTRIC BYPASS  2005   INDUCED ABORTION N/A 1997    patient was about 22 weeks   POLYPECTOMY  12/18/2019   Procedure: POLYPECTOMY;  Surgeon: 02/15/2020, MD;  Location: [facility];  Service: Gastroenterology;;   SHOULDER ARTHROSCOPY WITH OPEN ROTATOR CUFF REPAIR Left 06/14/2016   Procedure: SHOULDER ARTHROSCOPY WITH OPEN ROTATOR CUFF REPAIR, DECOMPRESSION, EXCISION LOOSE BODY, DEBRIDEMENT;  Surgeon: 08/14/2016, MD;  Location: [facility] ORS;  Service: Orthopedics;  Laterality: Left;    Current Medications: Current Meds  Medication Sig   acetaminophen (TYLENOL) 325 MG tablet Take 325 mg by mouth every 6 (six) hours as needed. PRN   albuterol (VENTOLIN HFA) 108 (90 Base) MCG/ACT inhaler Inhale 2 puffs into the lungs every 6 (six) hours as needed for wheezing or shortness of breath.   amLODipine (NORVASC) 2.5 MG tablet Take 1 tablet (2.5 mg total) by mouth daily.   aspirin EC 81 MG tablet Take 1 tablet (81 mg total) by mouth daily. Swallow whole.   fluticasone (FLONASE) 50 MCG/ACT nasal spray SHAKE LIQUID AND USE 2 SPRAYS IN EACH NOSTRIL DAILY   furosemide (LASIX) 20 MG tablet Take 1 tablet (20 mg total) by mouth daily as needed. For leg swelling   Insulin Pen Needle (NOVOFINE PEN NEEDLE) 32G X 6 MM MISC 1 each by Does not apply route daily at 12 noon.   Liraglutide -Weight Management (SAXENDA) 18 MG/3ML SOPN Inject 0.6 mg into the skin daily.   loratadine (CLARITIN) 10 MG  tablet Take 1 tablet (10 mg total) by mouth daily.   nystatin cream (MYCOSTATIN) Apply 1 application topically 2 (two) times daily.   omeprazole (PRILOSEC) 40 MG capsule TAKE 1 CAPSULE(40 MG) BY MOUTH TWICE DAILY BEFORE MEALS   rosuvastatin (CRESTOR) 40 MG tablet Take 1 tablet (40 mg total) by mouth daily.   triamterene-hydrochlorothiazide (MAXZIDE-25) 37.5-25 MG tablet Take 1 tablet by mouth daily. In place of HCTZ 25 , BP medication   Vitamin D, Ergocalciferol, (DRISDOL) 1.25 MG (50000 UNIT) CAPS capsule Take 1 capsule (50,000 Units total) by mouth every 7 (seven)  days.   [DISCONTINUED] rosuvastatin (CRESTOR) 20 MG tablet Take 1 tablet (20 mg total) by mouth daily.     Allergies:   Ace inhibitors, Codeine, and Valsartan   Social History   Socioeconomic History   Marital status: Single    Spouse name: Not on file   Number of children: 1   Years of education: Not on file   Highest education level: Not on file  Occupational History   Not on file  Tobacco Use   Smoking status: Never   Smokeless tobacco: Never  Vaping Use   Vaping Use: Never used  Substance and Sexual Activity   Alcohol use: No    Alcohol/week: 0.0 standard drinks   Drug use: No   Sexual activity: Not Currently  Other Topics Concern   Not on file  Social History Narrative   Desk job/works for insurance; no smoking; no alcohol; lives in [place].    Social Determinants of Health   Financial Resource Strain: Not on file  Food Insecurity: Not on file  Transportation Needs: Not on file  Physical Activity: Not on file  Stress: Not on file  Social Connections: Not on file     Family History: The patient's family history includes Breast cancer (age of onset: 24) in her cousin; Cancer in her father; Heart disease in her brother; Hypertension in her mother.  ROS:   Please see the history of present illness.     All other systems reviewed and are negative.  EKGs/Labs/Other Studies Reviewed:    The following studies were reviewed today:  Echo 02/2022  1. Left ventricular ejection fraction, by estimation, is 55 to 60%. The  left ventricle has normal function. The left ventricle has no regional  wall motion abnormalities. There is mild left ventricular hypertrophy.  Left ventricular diastolic parameters  were normal. The average left ventricular global longitudinal strain is  -20.6 %. The global longitudinal strain is normal.   2. Right ventricular systolic function is normal. The right ventricular  size is mildly enlarged.   3. The mitral valve is normal in structure.  Mild mitral valve  regurgitation.   4. The aortic valve is tricuspid. Aortic valve regurgitation is not  visualized.   Myoview Lexiscan  02/2022   No ST deviation was noted.   Pharmacological myocardial perfusion imaging study with no significant  ischemia Normal wall motion, EF estimated at 80% No EKG changes concerning for ischemia at peak stress or in recovery. CT coronary calcium scoring with mild coronary calcification, no significant aortic atherosclerosis Low risk scan     EKG:  EKG is not ordered today.   Recent Labs: 01/23/2022: ALT 10; BUN 13; Creat 0.84; Potassium 4.6; Sodium 139 02/28/2022: Hemoglobin 13.1; Platelets 227  Recent Lipid Panel    Component Value Date/Time   CHOL 266 (H) 01/23/2022 1534   CHOL 205 (H) 05/04/2016 1053   TRIG 131 01/23/2022  1534   HDL 68 01/23/2022 1534   HDL 64 05/04/2016 1053   CHOLHDL 3.9 01/23/2022 1534   VLDL 21 05/01/2017 0819   LDLCALC 171 (H) 01/23/2022 1534     Physical Exam:    VS:  BP 134/64 (BP Location: Left Arm, Patient Position: Sitting, Cuff Size: Large)   Pulse (!) 56   Ht 5\' 7"  (1.702 m)   Wt 274 lb (124.3 kg)   SpO2 99%   BMI 42.91 kg/m     Wt Readings from Last 3 Encounters:  03/28/22 274 lb (124.3 kg)  02/09/22 280 lb (127 kg)  01/23/22 278 lb (126.1 kg)     GEN:  Well nourished, well developed in no acute distress HEENT: Normal NECK: No JVD; No carotid bruits LYMPHATICS: No lymphadenopathy CARDIAC: RRR, no murmurs, rubs, gallops RESPIRATORY:  Clear to auscultation without rales, wheezing or rhonchi  ABDOMEN: Soft, non-tender, non-distended MUSCULOSKELETAL:  No edema; No deformity  SKIN: Warm and dry NEUROLOGIC:  Alert and oriented x 3 PSYCHIATRIC:  Normal affect   ASSESSMENT:    1. Dyspnea on exertion   2. Bilateral leg edema   3. Primary hypertension   4. Hyperlipidemia, mixed   5. Morbid obesity (HCC)   6. Coronary artery calcification    PLAN:    In order of problems listed  above:  DOE Echo showed normal LVEF with mild LVH and mildly enlarged RV. Myoview stress test was low risk, no significant ischemia. Imaging was overall reassuring. Suspect most of her breathing issues come from general deconditioning and weight. She is walking daily, trying to increase stamina, and lose weight.   LLE She has dependent lower leg edema. She has lasix 20mg  to use as needed. Recommend continue eg elevation and low salt diet.   HTN BP good today, suspect it will improve with activity/weight loss. Continue amlodipine 2.5mg  daily and Maxzide 37.5-25mg  daily.   HLD LDL 171, goal <71. I will increase Crestor to 40mg  daily. Can re-check LFTs/fasting lipids at follow-up.   Obesity Patient has lost about 6lbs with exercise. She is walking more throughout the day. Diet and lifestyle changes discussed in detail today. She was congratulated on weight loss.   Mild coronary artery calcification Noted on CT imaging of stress test. She denies chest pain. She has RF for CAD . I will start Aspirin 81mg  daily and increase statin. Lifestyle changes discussed as above.    Disposition: Follow up in 6 month(s) with MD/APP     Signed, Elva Breaker David StallH Jaylan Hinojosa, PA-C  03/28/2022 2:44 PM    [facility]

## 2022-03-28 NOTE — Patient Instructions (Signed)
Medication Instructions:  - Your physician has recommended you make the following change in your medication:   1) START Aspirin 81 mg: - take 1 tablet by mouth once daily  2) INCREASE Crestor (Rosuvastatin) to 40 mg: - take 1 tablet by mouth once daily   *If you need a refill on your cardiac medications before your next appointment, please call your pharmacy*   Lab Work: - none ordered  If you have labs (blood work) drawn today and your tests are completely normal, you will receive your results only by: [place] (if you have MyChart) OR A paper copy in the mail If you have any lab test that is abnormal or we need to change your treatment, we will call you to review the results.   Testing/Procedures: - none ordered   Follow-Up: At [facility], you and your health needs are our priority.  As part of our continuing mission to provide you with exceptional heart care, we have created designated Provider Care Teams.  These Care Teams include your primary Cardiologist (physician) and Advanced Practice Providers (APPs -  Physician Assistants and Nurse Practitioners) who all work together to provide you with the care you need, when you need it.  We recommend signing up for the patient portal called "MyChart".  Sign up information is provided on this After Visit Summary.  MyChart is used to connect with patients for Virtual Visits (Telemedicine).  Patients are able to view lab/test results, encounter notes, upcoming appointments, etc.  Non-urgent messages can be sent to your provider as well.   To learn more about what you can do with MyChart, go to [email or web address].    Your next appointment:   6 month(s)  The format for your next appointment:   In Person  Provider:   You may see Kate Sable, MD or one of the following Advanced Practice Providers on your designated Care Team:   Cadence Kathlen Mody, [place]    Other Instructions N/a  Important Information About  [place]

## 2022-04-03 ENCOUNTER — Telehealth (INDEPENDENT_AMBULATORY_CARE_PROVIDER_SITE_OTHER): Payer: BC Managed Care – PPO | Admitting: Family Medicine

## 2022-04-03 ENCOUNTER — Telehealth: Payer: Self-pay

## 2022-04-03 DIAGNOSIS — J069 Acute upper respiratory infection, unspecified: Secondary | ICD-10-CM | POA: Diagnosis not present

## 2022-04-03 DIAGNOSIS — J4520 Mild intermittent asthma, uncomplicated: Secondary | ICD-10-CM | POA: Diagnosis not present

## 2022-04-03 LAB — POCT INFLUENZA A/B
Influenza A, POC: NEGATIVE
Influenza B, POC: NEGATIVE

## 2022-04-03 MED ORDER — ALBUTEROL SULFATE HFA 108 (90 BASE) MCG/ACT IN AERS: 2.0000 | INHALATION_SPRAY | Freq: Four times a day (QID) | RESPIRATORY_TRACT | 0 refills | Status: DC | PRN

## 2022-04-03 NOTE — Telephone Encounter (Signed)
Flu test negative, were u going to give her something?

## 2022-04-03 NOTE — Progress Notes (Signed)
Virtual Visit via Video Note  I connected with Savannah Manning on 04/03/22 at 10:40 AM EDT by a video enabled telemedicine application and verified that I am speaking with the correct person using two identifiers.  Location: Patient: home Provider: [facility]   I discussed the limitations of evaluation and management by telemedicine and the availability of in person appointments. The patient expressed understanding and agreed to proceed.  History of Present Illness:  UPPER RESPIRATORY TRACT INFECTION - symptom onset yesterday.  - COVID negative this morning.  - mom with similar symptoms and vomiting, diagnosed with flu yesterday - has not had to use albuterol.  Fever: subjective with chills Cough: yes. Nonproductive. Shortness of breath: no Chest pain: yes, with cough Chest tightness: yes Chest congestion: yes Runny nose: yes Sore throat: yes Headache: yes Ear pressure: yes bilateral, L>R Vomiting: no Sick contacts: yes Strep contacts: no  Relief with OTC cold/cough medications: hasn't taken yet.   Treatments attempted: Just bought ricola, mucinex-DM but hasn't taken yet.    Observations/Objective:  Well appearing, in NAD. Speaks in full sentences. Comfortable WOB on RA. No resp distress.    Assessment and Plan:  VIRAL URI Doing well with mild sx. COVID negative.  Will test for flu, could be a good candidate for tamiflu if positive given h/o asthma. Reviewed OTC symptom relief, self-quarantine guidelines, and emergency precautions.      I discussed the assessment and treatment plan with the patient. The patient was provided an opportunity to ask questions and all were answered. The patient agreed with the plan and demonstrated an understanding of the instructions.   The patient was advised to call back or seek an in-person evaluation if the symptoms worsen or if the condition fails to improve as anticipated.  I provided 6 minutes of non-face-to-face time during this  encounter.   Caro Laroche, DO

## 2022-04-04 ENCOUNTER — Encounter: Payer: Self-pay | Admitting: Family Medicine

## 2022-04-04 MED ORDER — BENZONATATE 100 MG PO CAPS: 200.0000 mg | ORAL_CAPSULE | Freq: Three times a day (TID) | ORAL | 0 refills | Status: DC | PRN

## 2022-04-04 NOTE — Telephone Encounter (Signed)
Pt.notified

## 2022-04-30 ENCOUNTER — Inpatient Hospital Stay: Payer: BC Managed Care – PPO | Attending: Oncology

## 2022-05-01 ENCOUNTER — Encounter: Payer: Self-pay | Admitting: Family Medicine

## 2022-05-01 ENCOUNTER — Ambulatory Visit (INDEPENDENT_AMBULATORY_CARE_PROVIDER_SITE_OTHER): Payer: BC Managed Care – PPO | Admitting: Family Medicine

## 2022-05-01 VITALS — BP 126/68 | HR 85 | Resp 16 | Ht 67.0 in | Wt 270.0 lb

## 2022-05-01 DIAGNOSIS — F321 Major depressive disorder, single episode, moderate: Secondary | ICD-10-CM | POA: Diagnosis not present

## 2022-05-01 DIAGNOSIS — I10 Essential (primary) hypertension: Secondary | ICD-10-CM | POA: Diagnosis not present

## 2022-05-01 DIAGNOSIS — F5105 Insomnia due to other mental disorder: Secondary | ICD-10-CM

## 2022-05-01 DIAGNOSIS — R0609 Other forms of dyspnea: Secondary | ICD-10-CM

## 2022-05-01 DIAGNOSIS — Z6841 Body Mass Index (BMI) 40.0 and over, adult: Secondary | ICD-10-CM

## 2022-05-01 DIAGNOSIS — L304 Erythema intertrigo: Secondary | ICD-10-CM | POA: Diagnosis not present

## 2022-05-01 MED ORDER — SAXENDA 18 MG/3ML ~~LOC~~ SOPN: 0.6000 mg | PEN_INJECTOR | Freq: Every day | SUBCUTANEOUS | 0 refills | Status: DC

## 2022-05-01 MED ORDER — QUETIAPINE FUMARATE 25 MG PO TABS: 25.0000 mg | ORAL_TABLET | Freq: Every day | ORAL | 0 refills | Status: DC

## 2022-05-01 MED ORDER — ESCITALOPRAM OXALATE 5 MG PO TABS: 5.0000 mg | ORAL_TABLET | Freq: Every day | ORAL | 0 refills | Status: DC

## 2022-05-01 MED ORDER — NYSTATIN 100000 UNIT/GM EX CREA: 1.0000 | TOPICAL_CREAM | Freq: Two times a day (BID) | CUTANEOUS | 5 refills | Status: AC

## 2022-05-01 NOTE — Assessment & Plan Note (Signed)
Advised her to resume Saxenda and titrate dose up , we can try switching to [facility] when nation wide shortage ends

## 2022-05-01 NOTE — Assessment & Plan Note (Signed)
Improved

## 2022-05-04 ENCOUNTER — Telehealth: Payer: BC Managed Care – PPO | Admitting: Physician Assistant

## 2022-05-29 ENCOUNTER — Other Ambulatory Visit: Payer: BC Managed Care – PPO

## 2022-05-29 ENCOUNTER — Inpatient Hospital Stay: Payer: BC Managed Care – PPO

## 2022-05-30 ENCOUNTER — Inpatient Hospital Stay (HOSPITAL_BASED_OUTPATIENT_CLINIC_OR_DEPARTMENT_OTHER): Payer: BC Managed Care – PPO | Admitting: Oncology

## 2022-05-30 ENCOUNTER — Inpatient Hospital Stay: Payer: BC Managed Care – PPO

## 2022-05-30 ENCOUNTER — Encounter: Payer: Self-pay | Admitting: Oncology

## 2022-05-30 ENCOUNTER — Inpatient Hospital Stay: Payer: BC Managed Care – PPO | Attending: Oncology

## 2022-05-30 VITALS — BP 142/87 | HR 52 | Wt 247.9 lb

## 2022-05-30 DIAGNOSIS — Z79899 Other long term (current) drug therapy: Secondary | ICD-10-CM | POA: Insufficient documentation

## 2022-05-30 DIAGNOSIS — E538 Deficiency of other specified B group vitamins: Secondary | ICD-10-CM

## 2022-05-30 DIAGNOSIS — D509 Iron deficiency anemia, unspecified: Secondary | ICD-10-CM | POA: Diagnosis not present

## 2022-05-30 DIAGNOSIS — Z9884 Bariatric surgery status: Secondary | ICD-10-CM | POA: Diagnosis not present

## 2022-05-30 DIAGNOSIS — Z90710 Acquired absence of both cervix and uterus: Secondary | ICD-10-CM | POA: Insufficient documentation

## 2022-05-30 DIAGNOSIS — D508 Other iron deficiency anemias: Secondary | ICD-10-CM | POA: Diagnosis not present

## 2022-05-30 LAB — IRON AND TIBC
Iron: 95 ug/dL (ref 28–170)
Saturation Ratios: 23 % (ref 10.4–31.8)
TIBC: 406 ug/dL (ref 250–450)
UIBC: 311 ug/dL

## 2022-05-30 LAB — CBC
HCT: 37.1 % (ref 36.0–46.0)
Hemoglobin: 12.5 g/dL (ref 12.0–15.0)
MCH: 30.4 pg (ref 26.0–34.0)
MCHC: 33.7 g/dL (ref 30.0–36.0)
MCV: 90.3 fL (ref 80.0–100.0)
Platelets: 240 10*3/uL (ref 150–400)
RBC: 4.11 MIL/uL (ref 3.87–5.11)
RDW: 13.2 % (ref 11.5–15.5)
WBC: 6.4 10*3/uL (ref 4.0–10.5)
nRBC: 0 % (ref 0.0–0.2)

## 2022-05-30 LAB — FERRITIN: Ferritin: 26 ng/mL (ref 11–307)

## 2022-05-30 MED ORDER — CYANOCOBALAMIN 1000 MCG/ML IJ SOLN
1000.0000 ug | Freq: Once | INTRAMUSCULAR | Status: AC
  Administered 2022-05-30: 1000 ug via INTRAMUSCULAR
  Filled 2022-05-30: qty 1

## 2022-05-31 ENCOUNTER — Encounter: Payer: Self-pay | Admitting: Oncology

## 2022-05-31 NOTE — Progress Notes (Signed)
Hematology/Oncology Consult note [facility]  Telephone:([phone] [phone]  Patient Care Team: Alba Cory, MD as PCP - General (Family Medicine) Tresea Mall, CNM as Midwife (Obstetrics) Creig Hines, MD as Medical Oncologist (Hematology and Oncology)   Name of the patient: Savannah Manning  [id number]  01-16-1961   Date of visit: 05/31/22  Diagnosis- history of gastric bypass and iron and B12 deficiency anemia    Chief complaint/ Reason for visit- routine f/u of anemia  Heme/Onc history: Patient is a 61 year old female with history of gastric bypass surgery in 2005 and subsequent iron and B12 deficiency anemia.  Hysterectomy in 2012.  Last colonoscopy in February 2021.  EGD in May 2021 which showed Roux-en-Y gastrojejunostomy with GJ anastomosis characterized by healthy-appearing mucosa.  She is currently on monthly B12 injections      Interval history- she is doing well. Denies any specific complaints. Denies any blood loss in stool or urine  ECOG PS- 1 Pain scale- 0   Review of systems- Review of Systems  Constitutional:  Positive for malaise/fatigue. Negative for chills, fever and weight loss.  HENT:  Negative for congestion, ear discharge and nosebleeds.   Eyes:  Negative for blurred vision.  Respiratory:  Negative for cough, hemoptysis, sputum production, shortness of breath and wheezing.   Cardiovascular:  Negative for chest pain, palpitations, orthopnea and claudication.  Gastrointestinal:  Negative for abdominal pain, blood in stool, constipation, diarrhea, heartburn, melena, nausea and vomiting.  Genitourinary:  Negative for dysuria, flank pain, frequency, hematuria and urgency.  Musculoskeletal:  Negative for back pain, joint pain and myalgias.  Skin:  Negative for rash.  Neurological:  Negative for dizziness, tingling, focal weakness, seizures, weakness and headaches.  Endo/Heme/Allergies:  Does not bruise/bleed  easily.  Psychiatric/Behavioral:  Negative for depression and suicidal ideas. The patient does not have insomnia.       Allergies  Allergen Reactions   Ace Inhibitors     angioedema   Codeine Swelling   Valsartan Swelling     Past Medical History:  Diagnosis Date   Anemia    Arthritis    Asthma    GERD (gastroesophageal reflux disease)    Headache    Hypertension    Iron deficiency anemia 12/05/2018   Wears contact lenses      Past Surgical History:  Procedure Laterality Date   ABDOMINAL HYSTERECTOMY  2012   CHOLECYSTECTOMY     COLONOSCOPY WITH PROPOFOL N/A 12/18/2019   Procedure: COLONOSCOPY WITH PROPOFOL;  Surgeon: Toney Reil, MD;  Location: [facility];  Service: Gastroenterology;  Laterality: N/A;   DILATION AND CURETTAGE OF UTERUS     ESOPHAGOGASTRODUODENOSCOPY (EGD) WITH PROPOFOL N/A 03/18/2020   Procedure: ESOPHAGOGASTRODUODENOSCOPY (EGD) WITH PROPOFOL;  Surgeon: Toney Reil, MD;  Location: [facility] ENDOSCOPY;  Service: Gastroenterology;  Laterality: N/A;   GASTRIC BYPASS  2005   INDUCED ABORTION N/A 1997   patient was about 22 weeks   POLYPECTOMY  12/18/2019   Procedure: POLYPECTOMY;  Surgeon: Toney Reil, MD;  Location: [facility];  Service: Gastroenterology;;   SHOULDER ARTHROSCOPY WITH OPEN ROTATOR CUFF REPAIR Left 06/14/2016   Procedure: SHOULDER ARTHROSCOPY WITH OPEN ROTATOR CUFF REPAIR, DECOMPRESSION, EXCISION LOOSE BODY, DEBRIDEMENT;  Surgeon: Christena Flake, MD;  Location: [facility] ORS;  Service: Orthopedics;  Laterality: Left;    Social History   Socioeconomic History   Marital status: Single    Spouse name: Not on file   Number of children: 1  Years of education: Not on file   Highest education level: Not on file  Occupational History   Not on file  Tobacco Use   Smoking status: Never   Smokeless tobacco: Never  Vaping Use   Vaping Use: Never used  Substance and Sexual Activity   Alcohol use: No     Alcohol/week: 0.0 standard drinks of alcohol   Drug use: No   Sexual activity: Not Currently  Other Topics Concern   Not on file  Social History Narrative   Desk job/works for insurance; no smoking; no alcohol; lives in [place].    Social Determinants of Health   Financial Resource Strain: Low Risk  (12/05/2018)   Overall Financial Resource Strain (CARDIA)    Difficulty of Paying Living Expenses: Not hard at all  Food Insecurity: No Food Insecurity (12/05/2018)   Hunger Vital Sign    Worried About Running Out of Food in the Last Year: Never true    Ran Out of Food in the Last Year: Never true  Transportation Needs: No Transportation Needs (12/05/2018)   PRAPARE - Administrator, Civil Service (Medical): No    Lack of Transportation (Non-Medical): No  Physical Activity: Inactive (12/05/2018)   Exercise Vital Sign    Days of Exercise per Week: 0 days    Minutes of Exercise per Session: 0 min  Stress: No Stress Concern Present (12/05/2018)   [employer] of Occupational Health - Occupational Stress Questionnaire    Feeling of Stress : Not at all  Social Connections: Somewhat Isolated (12/05/2018)   Social Connection and Isolation Panel [NHANES]    Frequency of Communication with Friends and Family: More than three times a week    Frequency of Social Gatherings with Friends and Family: More than three times a week    Attends Religious Services: 1 to 4 times per year    Active Member of [employer] or Organizations: Not on file    Attends Banker Meetings: Never    Marital Status: Never married  Intimate Partner Violence: Not At Risk (12/05/2018)   Humiliation, Afraid, Rape, and Kick questionnaire    Fear of Current or Ex-Partner: No    Emotionally Abused: No    Physically Abused: No    Sexually Abused: No    Family History  Problem Relation Age of Onset   Hypertension Mother    Cancer Father        prostate   Heart disease Brother    Breast cancer Cousin  40       mat cousin     Current Outpatient Medications:    acetaminophen (TYLENOL) 325 MG tablet, Take 325 mg by mouth every 6 (six) hours as needed. PRN, Disp: , Rfl:    albuterol (VENTOLIN HFA) 108 (90 Base) MCG/ACT inhaler, Inhale 2 puffs into the lungs every 6 (six) hours as needed for wheezing or shortness of breath., Disp: 18 g, Rfl: 0   amLODipine (NORVASC) 2.5 MG tablet, Take 1 tablet (2.5 mg total) by mouth daily., Disp: 90 tablet, Rfl: 1   furosemide (LASIX) 20 MG tablet, Take 1 tablet (20 mg total) by mouth daily as needed. For leg swelling, Disp: 30 tablet, Rfl: 1   Liraglutide -Weight Management (SAXENDA) 18 MG/3ML SOPN, Inject 0.6 mg into the skin daily., Disp: 15 mL, Rfl: 0   rosuvastatin (CRESTOR) 40 MG tablet, Take 1 tablet (40 mg total) by mouth daily., Disp: 90 tablet, Rfl: 2   triamterene-hydrochlorothiazide (MAXZIDE-25) 37.5-25 MG  tablet, Take 1 tablet by mouth daily. In place of HCTZ 25 , BP medication, Disp: 90 tablet, Rfl: 1   Vitamin D, Ergocalciferol, (DRISDOL) 1.25 MG (50000 UNIT) CAPS capsule, Take 1 capsule (50,000 Units total) by mouth every 7 (seven) days., Disp: 12 capsule, Rfl: 1   aspirin EC 81 MG tablet, Take 1 tablet (81 mg total) by mouth daily. Swallow whole. (Patient not taking: Reported on 05/30/2022), Disp: , Rfl:    escitalopram (LEXAPRO) 5 MG tablet, Take 1 tablet (5 mg total) by mouth daily. (Patient not taking: Reported on 05/30/2022), Disp: 90 tablet, Rfl: 0   fluticasone (FLONASE) 50 MCG/ACT nasal spray, SHAKE LIQUID AND USE 2 SPRAYS IN EACH NOSTRIL DAILY (Patient not taking: Reported on 05/30/2022), Disp: 48 g, Rfl: 0   Insulin Pen Needle (NOVOFINE PEN NEEDLE) 32G X 6 MM MISC, 1 each by Does not apply route daily at 12 noon. (Patient not taking: Reported on 05/30/2022), Disp: 100 each, Rfl: 1   loratadine (CLARITIN) 10 MG tablet, Take 1 tablet (10 mg total) by mouth daily. (Patient not taking: Reported on 05/30/2022), Disp: 90 tablet, Rfl: 1   nystatin  cream (MYCOSTATIN), Apply 1 Application topically 2 (two) times daily., Disp: 30 g, Rfl: 5   omeprazole (PRILOSEC) 40 MG capsule, TAKE 1 CAPSULE(40 MG) BY MOUTH TWICE DAILY BEFORE MEALS (Patient not taking: Reported on 05/30/2022), Disp: 180 capsule, Rfl: 0   QUEtiapine (SEROQUEL) 25 MG tablet, Take 1 tablet (25 mg total) by mouth at bedtime. (Patient not taking: Reported on 05/30/2022), Disp: 90 tablet, Rfl: 0  Physical exam:  Vitals:   05/30/22 1432  BP: (!) 142/87  Pulse: (!) 52  SpO2: 97%  Weight: 247 lb 14.4 oz (112.4 kg)   Physical Exam Cardiovascular:     Rate and Rhythm: Normal rate and regular rhythm.     Heart sounds: Normal heart sounds.  Pulmonary:     Effort: Pulmonary effort is normal.     Breath sounds: Normal breath sounds.  Abdominal:     General: Bowel sounds are normal.     Palpations: Abdomen is soft.  Skin:    General: Skin is warm and dry.  Neurological:     Mental Status: She is alert and oriented to person, place, and time.         Latest Ref Rng & Units 01/23/2022    3:34 PM  CMP  Glucose 65 - 99 mg/dL 84   BUN 7 - 25 mg/dL 13   Creatinine 9.56 - 1.03 mg/dL 3.87   Sodium 564 - 332 mmol/L 139   Potassium 3.5 - 5.3 mmol/L 4.6   Chloride 98 - 110 mmol/L 103   CO2 20 - 32 mmol/L 28   Calcium 8.6 - 10.4 mg/dL 9.2   Total Protein 6.1 - 8.1 g/dL 6.9   Total Bilirubin 0.2 - 1.2 mg/dL 0.6   AST 10 - 35 U/L 13   ALT 6 - 29 U/L 10       Latest Ref Rng & Units 05/30/2022    2:16 PM  CBC  WBC 4.0 - 10.5 K/uL 6.4   Hemoglobin 12.0 - 15.0 g/dL 95.1   Hematocrit 88.4 - 46.0 % 37.1   Platelets 150 - 400 K/uL 240     No images are attached to the encounter.  No results found.   Assessment and plan- Patient is a 61 y.o. female who is here for routine follow-up of iron and B12 deficiency anemia possibly secondary  to gastric bypass.  B12 deficiency: Patient has been getting monthly B12 injections up until May 2023.  She did not get it last month but  could not get it today. we can switch her to oral B12 following that.  We will repeat B12 levels again in 4 months.  If levels drop again we will switch her to injections permanently.  Patient is not presently anemic with a hemoglobin that has been stable between 12.5-13.5.  Ferritin levels are mildly low at 26 but they have been that way for the last 1 year and patient is not overtly symptomatic at this time.  We will continue to monitor her ferritin levels every 4 months and if they drop further we will give her IV iron at that time   Visit Diagnosis 1. Iron deficiency anemia, unspecified iron deficiency anemia type   2. History of gastric bypass      Dr. Owens Shark, MD, MPH [facility] [phone] 05/31/2022 11:02 AM

## 2022-06-12 ENCOUNTER — Encounter: Payer: Self-pay | Admitting: Family Medicine

## 2022-06-12 ENCOUNTER — Ambulatory Visit (INDEPENDENT_AMBULATORY_CARE_PROVIDER_SITE_OTHER): Payer: BC Managed Care – PPO | Admitting: Family Medicine

## 2022-06-12 ENCOUNTER — Ambulatory Visit: Payer: BC Managed Care – PPO | Admitting: Family Medicine

## 2022-06-12 VITALS — BP 138/82 | HR 78 | Temp 97.7°F | Resp 16 | Ht 67.0 in | Wt 264.9 lb

## 2022-06-12 DIAGNOSIS — Z0000 Encounter for general adult medical examination without abnormal findings: Secondary | ICD-10-CM

## 2022-06-12 DIAGNOSIS — Z23 Encounter for immunization: Secondary | ICD-10-CM | POA: Diagnosis not present

## 2022-06-12 NOTE — Patient Instructions (Signed)
Preventive Care 40-59 Years Old, Female Preventive care refers to lifestyle choices and visits with your health care provider that can promote health and wellness. Preventive care visits are also called wellness exams. What can I expect for my preventive care visit? Counseling Your health care provider may ask you questions about your: Medical history, including: Past medical problems. Family medical history. Pregnancy history. Current health, including: Menstrual cycle. Method of birth control. Emotional well-being. Home life and relationship well-being. Sexual activity and sexual health. Lifestyle, including: Alcohol, nicotine or tobacco, and drug use. Access to firearms. Diet, exercise, and sleep habits. Work and work environment. Sunscreen use. Safety issues such as seatbelt and bike helmet use. Physical exam Your health care provider will check your: Height and weight. These may be used to calculate your BMI (body mass index). BMI is a measurement that tells if you are at a healthy weight. Waist circumference. This measures the distance around your waistline. This measurement also tells if you are at a healthy weight and may help predict your risk of certain diseases, such as type 2 diabetes and high blood pressure. Heart rate and blood pressure. Body temperature. Skin for abnormal spots. What immunizations do I need?  Vaccines are usually given at various ages, according to a schedule. Your health care provider will recommend vaccines for you based on your age, medical history, and lifestyle or other factors, such as travel or where you work. What tests do I need? Screening Your health care provider may recommend screening tests for certain conditions. This may include: Lipid and cholesterol levels. Diabetes screening. This is done by checking your blood sugar (glucose) after you have not eaten for a while (fasting). Pelvic exam and Pap test. Hepatitis B test. Hepatitis C  test. HIV (human immunodeficiency virus) test. STI (sexually transmitted infection) testing, if you are at risk. Lung cancer screening. Colorectal cancer screening. Mammogram. Talk with your health care provider about when you should start having regular mammograms. This may depend on whether you have a family history of breast cancer. BRCA-related cancer screening. This may be done if you have a family history of breast, ovarian, tubal, or peritoneal cancers. Bone density scan. This is done to screen for osteoporosis. Talk with your health care provider about your test results, treatment options, and if necessary, the need for more tests. Follow these instructions at home: Eating and drinking  Eat a diet that includes fresh fruits and vegetables, whole grains, lean protein, and low-fat dairy products. Take vitamin and mineral supplements as recommended by your health care provider. Do not drink alcohol if: Your health care provider tells you not to drink. You are pregnant, may be pregnant, or are planning to become pregnant. If you drink alcohol: Limit how much you have to 0-1 drink a day. Know how much alcohol is in your drink. In the [place] one drink equals one 12 oz bottle of beer (355 mL), one 5 oz glass of wine (148 mL), or one 1 oz glass of hard liquor (44 mL). Lifestyle Brush your teeth every morning and night with fluoride toothpaste. Floss one time each day. Exercise for at least 30 minutes 5 or more days each week. Do not use any products that contain nicotine or tobacco. These products include cigarettes, chewing tobacco, and vaping devices, such as e-cigarettes. If you need help quitting, ask your health care provider. Do not use drugs. If you are sexually active, practice safe sex. Use a condom or other form of protection to   prevent STIs. If you do not wish to become pregnant, use a form of birth control. If you plan to become pregnant, see your health care provider for a  prepregnancy visit. Take aspirin only as told by your health care provider. Make sure that you understand how much to take and what form to take. Work with your health care provider to find out whether it is safe and beneficial for you to take aspirin daily. Find healthy ways to manage stress, such as: Meditation, yoga, or listening to music. Journaling. Talking to a trusted person. Spending time with friends and family. Minimize exposure to UV radiation to reduce your risk of skin cancer. Safety Always wear your seat belt while driving or riding in a vehicle. Do not drive: If you have been drinking alcohol. Do not ride with someone who has been drinking. When you are tired or distracted. While texting. If you have been using any mind-altering substances or drugs. Wear a helmet and other protective equipment during sports activities. If you have firearms in your house, make sure you follow all gun safety procedures. Seek help if you have been physically or sexually abused. What's next? Visit your health care provider once a year for an annual wellness visit. Ask your health care provider how often you should have your eyes and teeth checked. Stay up to date on all vaccines. This information is not intended to replace advice given to you by your health care provider. Make sure you discuss any questions you have with your health care provider. Document Revised: 04/19/2021 Document Reviewed: 04/19/2021 Elsevier Patient Education  [place].

## 2022-06-12 NOTE — Progress Notes (Unsigned)
Name: Savannah Manning   MRN: [id number]    DOB: 02-07-1961   Date:06/12/2022       Progress Note  Subjective  Chief Complaint  Annual Exam  HPI  Patient presents for annual CPE.  Diet: discussed healthy diet and increase calcium intake  Exercise: not physically active    [place] Visit from 12/05/2018 in [facility]  AUDIT-C Score 0      Depression: Phq 9 is  positive    06/12/2022    3:44 PM 05/01/2022    3:28 PM 01/23/2022    2:22 PM 12/04/2021   11:51 AM 08/07/2021   10:00 AM  Depression screen PHQ 2/9  Decreased Interest 1 1 0 0 0  Down, Depressed, Hopeless 0 1 0 0 0  PHQ - 2 Score 1 2 0 0 0  Altered sleeping 3 3 0 0 0  Tired, decreased energy 1 3 0 0 0  Change in appetite 0 0 0 0 0  Feeling bad or failure about yourself  0 3 0 0 0  Trouble concentrating 1 3 0 0 3  Moving slowly or fidgety/restless 0 0 0 0 0  Suicidal thoughts 0 0 0 0 0  PHQ-9 Score 6 14 0 0 3  Difficult doing work/chores    Not difficult at all    Hypertension: BP Readings from Last 3 Encounters:  06/12/22 138/82  05/30/22 (!) 142/87  05/01/22 126/68   Obesity: Wt Readings from Last 3 Encounters:  06/12/22 264 lb 14.4 oz (120.2 kg)  05/30/22 247 lb 14.4 oz (112.4 kg)  05/01/22 270 lb (122.5 kg)   BMI Readings from Last 3 Encounters:  06/12/22 41.49 kg/m  05/30/22 38.83 kg/m  05/01/22 42.29 kg/m     Vaccines:   HPV: N/A Tdap: up to date Shingrix: 1 of 2, she will have second shot today  Pneumonia: N/A Flu: 2021, discussed yearly shots  COVID-19: discussed bivalent booster    Hep C Screening: 10/12/16 STD testing and prevention (HIV/chl/gon/syphilis): 06/22/04 Intimate partner violence: negative screen  Sexual History : not sexually active since last pap smear  Menstrual History/LMP/Abnormal Bleeding: post-menopausal  Discussed importance of follow up if any post-menopausal bleeding: yes  Incontinence Symptoms: no symptoms   Breast cancer:  -  Last Mammogram: 01/17/22 - BRCA gene screening: N/A  Osteoporosis Prevention : Discussed high calcium and vitamin D supplementation, weight bearing exercises Bone density :not applicable   Cervical cancer screening: 12/21/20  Skin cancer: Discussed monitoring for atypical lesions  Colorectal cancer: 12/18/19   Lung cancer:  Low Dose CT Chest recommended if Age 45-80 years, 20 pack-year currently smoking OR have quit w/in 15years. Patient does not qualify for screen   ECG: 02/09/22  Advanced Care Planning: A voluntary discussion about advance care planning including the explanation and discussion of advance directives.  Discussed health care proxy and Living will, and the patient was able to identify a health care proxy as mother .  Patient does not have a living will and power of attorney of health care   Lipids: Lab Results  Component Value Date   CHOL 266 (H) 01/23/2022   CHOL 161 10/20/2020   CHOL 194 12/04/2019   Lab Results  Component Value Date   HDL 68 01/23/2022   HDL 62 10/20/2020   HDL 75 12/04/2019   Lab Results  Component Value Date   LDLCALC 171 (H) 01/23/2022   LDLCALC 78 10/20/2020   LDLCALC 98 12/04/2019  Lab Results  Component Value Date   TRIG 131 01/23/2022   TRIG 131 10/20/2020   TRIG 115 12/04/2019   Lab Results  Component Value Date   CHOLHDL 3.9 01/23/2022   CHOLHDL 2.6 10/20/2020   CHOLHDL 2.6 12/04/2019   No results found for: "LDLDIRECT"  Glucose: Glucose  Date Value Ref Range Status  10/23/2014 89 65 - 99 mg/dL Final   Glucose, Bld  Date Value Ref Range Status  01/23/2022 84 65 - 99 mg/dL Final    Comment:    .            Fasting reference interval .   12/01/2021 86 70 - 99 mg/dL Final    Comment:    Glucose reference range applies only to samples taken after fasting for at least 8 hours.  10/20/2020 87 65 - 99 mg/dL Final    Comment:    .            Fasting reference interval .     Patient Active Problem List   Diagnosis  Date Noted   Moderate major depression ([place]) 05/01/2022   Intertrigo 05/01/2022   Insomnia due to mental condition 05/01/2022   Mild intermittent asthma without complication 60/45/4098   Primary osteoarthritis of both knees 10/20/2020   Primary osteoarthritis of right knee 04/06/2020   Primary osteoarthritis of left knee 04/06/2020   Pre-diabetes 12/05/2018   Malabsorption of iron 11/07/2018   Chronic pain of right knee 07/26/2017   Vitamin D deficiency 04/24/2017   B12 deficiency 04/24/2017   Abnormal mammogram of right breast 01/01/2017   Asthma, mild persistent 12/17/2016   History of shoulder surgery 10/12/2016   Primary osteoarthritis of left shoulder 06/14/2016   Incomplete tear of left rotator cuff 05/30/2016   Rotator cuff tendinitis 05/30/2016   History of bariatric surgery 05/04/2016   Allergic rhinitis 05/12/2015   Benign essential HTN 05/12/2015   Anxiety and depression 05/12/2015   Insomnia, persistent 05/12/2015   Dyslipidemia 05/12/2015   Edema extremities 05/12/2015   Gastroesophageal reflux disease without esophagitis 05/12/2015   Migraine 05/12/2015   Plantar fasciitis 11/91/4782   Umbilical hernia without obstruction and without gangrene 05/12/2015   Morbid obesity with BMI of 40.0-44.9, adult ([place]) 02/13/2008    Past Surgical History:  Procedure Laterality Date   ABDOMINAL HYSTERECTOMY  2012   CHOLECYSTECTOMY     COLONOSCOPY WITH PROPOFOL N/A 12/18/2019   Procedure: COLONOSCOPY WITH PROPOFOL;  Surgeon: Lin Landsman, MD;  Location: [facility];  Service: Gastroenterology;  Laterality: N/A;   DILATION AND CURETTAGE OF UTERUS     ESOPHAGOGASTRODUODENOSCOPY (EGD) WITH PROPOFOL N/A 03/18/2020   Procedure: ESOPHAGOGASTRODUODENOSCOPY (EGD) WITH PROPOFOL;  Surgeon: Lin Landsman, MD;  Location: [facility];  Service: Gastroenterology;  Laterality: N/A;   GASTRIC BYPASS  2005   INDUCED ABORTION N/A 1997   patient was about 22 weeks    POLYPECTOMY  12/18/2019   Procedure: POLYPECTOMY;  Surgeon: Lin Landsman, MD;  Location: [facility];  Service: Gastroenterology;;   SHOULDER ARTHROSCOPY WITH OPEN ROTATOR CUFF REPAIR Left 06/14/2016   Procedure: SHOULDER ARTHROSCOPY WITH OPEN ROTATOR CUFF REPAIR, DECOMPRESSION, EXCISION LOOSE BODY, DEBRIDEMENT;  Surgeon: Corky Mull, MD;  Location: [facility];  Service: Orthopedics;  Laterality: Left;    Family History  Problem Relation Age of Onset   Hypertension Mother    Cancer Father        prostate   Heart disease Brother    Breast cancer Cousin  20       mat cousin    Social History   Socioeconomic History   Marital status: Single    Spouse name: Not on file   Number of children: 1   Years of education: Not on file   Highest education level: Not on file  Occupational History   Not on file  Tobacco Use   Smoking status: Never   Smokeless tobacco: Never  Vaping Use   Vaping Use: Never used  Substance and Sexual Activity   Alcohol use: No    Alcohol/week: 0.0 standard drinks of alcohol   Drug use: No   Sexual activity: Not Currently  Other Topics Concern   Not on file  Social History Narrative   Desk job/works for insurance; no smoking; no alcohol; lives in [place].    Social Determinants of Health   Financial Resource Strain: Low Risk  (06/12/2022)   Overall Financial Resource Strain (CARDIA)    Difficulty of Paying Living Expenses: Not hard at all  Food Insecurity: No Food Insecurity (06/12/2022)   Hunger Vital Sign    Worried About Running Out of Food in the Last Year: Never true    Ran Out of Food in the Last Year: Never true  Transportation Needs: No Transportation Needs (06/12/2022)   PRAPARE - Hydrologist (Medical): No    Lack of Transportation (Non-Medical): No  Physical Activity: Inactive (06/12/2022)   Exercise Vital Sign    Days of Exercise per Week: 0 days    Minutes of Exercise per Session: 0 min  Stress: Stress  Concern Present (06/12/2022)   [place]    Feeling of Stress : To some extent  Social Connections: Moderately Integrated (06/12/2022)   Social Connection and Isolation Panel [NHANES]    Frequency of Communication with Friends and Family: More than three times a week    Frequency of Social Gatherings with Friends and Family: Not on file    Attends Religious Services: More than 4 times per year    Active Member of [employer] or Organizations: Yes    Attends Archivist Meetings: 1 to 4 times per year    Marital Status: Never married  Intimate Partner Violence: Not At Risk (06/12/2022)   Humiliation, Afraid, Rape, and Kick questionnaire    Fear of Current or Ex-Partner: No    Emotionally Abused: No    Physically Abused: No    Sexually Abused: No     Current Outpatient Medications:    acetaminophen (TYLENOL) 325 MG tablet, Take 325 mg by mouth every 6 (six) hours as needed. PRN, Disp: , Rfl:    albuterol (VENTOLIN HFA) 108 (90 Base) MCG/ACT inhaler, Inhale 2 puffs into the lungs every 6 (six) hours as needed for wheezing or shortness of breath., Disp: 18 g, Rfl: 0   amLODipine (NORVASC) 2.5 MG tablet, Take 1 tablet (2.5 mg total) by mouth daily., Disp: 90 tablet, Rfl: 1   Liraglutide -Weight Management (SAXENDA) 18 MG/3ML SOPN, Inject 0.6 mg into the skin daily., Disp: 15 mL, Rfl: 0   nystatin cream (MYCOSTATIN), Apply 1 Application topically 2 (two) times daily., Disp: 30 g, Rfl: 5   rosuvastatin (CRESTOR) 40 MG tablet, Take 1 tablet (40 mg total) by mouth daily., Disp: 90 tablet, Rfl: 2   triamterene-hydrochlorothiazide (MAXZIDE-25) 37.5-25 MG tablet, Take 1 tablet by mouth daily. In place of HCTZ 25 , BP medication, Disp: 90 tablet, Rfl:  1   Vitamin D, Ergocalciferol, (DRISDOL) 1.25 MG (50000 UNIT) CAPS capsule, Take 1 capsule (50,000 Units total) by mouth every 7 (seven) days., Disp: 12 capsule, Rfl: 1   aspirin EC 81 MG  tablet, Take 1 tablet (81 mg total) by mouth daily. Swallow whole. (Patient not taking: Reported on 05/30/2022), Disp: , Rfl:    escitalopram (LEXAPRO) 5 MG tablet, Take 1 tablet (5 mg total) by mouth daily. (Patient not taking: Reported on 05/30/2022), Disp: 90 tablet, Rfl: 0   fluticasone (FLONASE) 50 MCG/ACT nasal spray, SHAKE LIQUID AND USE 2 SPRAYS IN EACH NOSTRIL DAILY (Patient not taking: Reported on 05/30/2022), Disp: 48 g, Rfl: 0   furosemide (LASIX) 20 MG tablet, Take 1 tablet (20 mg total) by mouth daily as needed. For leg swelling, Disp: 30 tablet, Rfl: 1   Insulin Pen Needle (NOVOFINE PEN NEEDLE) 32G X 6 MM MISC, 1 each by Does not apply route daily at 12 noon. (Patient not taking: Reported on 05/30/2022), Disp: 100 each, Rfl: 1   loratadine (CLARITIN) 10 MG tablet, Take 1 tablet (10 mg total) by mouth daily. (Patient not taking: Reported on 05/30/2022), Disp: 90 tablet, Rfl: 1   omeprazole (PRILOSEC) 40 MG capsule, TAKE 1 CAPSULE(40 MG) BY MOUTH TWICE DAILY BEFORE MEALS (Patient not taking: Reported on 05/30/2022), Disp: 180 capsule, Rfl: 0   QUEtiapine (SEROQUEL) 25 MG tablet, Take 1 tablet (25 mg total) by mouth at bedtime. (Patient not taking: Reported on 05/30/2022), Disp: 90 tablet, Rfl: 0  Allergies  Allergen Reactions   Ace Inhibitors     angioedema   Codeine Swelling   Valsartan Swelling     ROS  Constitutional: Negative for fever or weight change.  Respiratory: Negative for cough and shortness of breath.   Cardiovascular: Negative for chest pain or palpitations.  Gastrointestinal: Negative for abdominal pain, no bowel changes.  Musculoskeletal: Negative for gait problem or joint swelling.  Skin: Negative for rash.  Neurological: Negative for dizziness or headache.  No other specific complaints in a complete review of systems (except as listed in HPI above).   Objective  Vitals:   06/12/22 1529  BP: 138/82  Pulse: 78  Resp: 16  Temp: 97.7 F (36.5 C)  TempSrc:  Oral  SpO2: 98%  Weight: 264 lb 14.4 oz (120.2 kg)  Height: _0  (1.702 m)    Body mass index is 41.49 kg/m.  Physical Exam  Constitutional: Patient appears well-developed and well-nourished. No distress.  HENT: Head: Normocephalic and atraumatic. Ears: B TMs ok, no erythema or effusion; Nose: Nose normal. Mouth/Throat: Oropharynx is clear and moist. No oropharyngeal exudate.  Eyes: Conjunctivae and EOM are normal. Pupils are equal, round, and reactive to light. No scleral icterus.  Neck: Normal range of motion. Neck supple. No JVD present. No thyromegaly present.  Cardiovascular: Normal rate, regular rhythm and normal heart sounds.  No murmur heard. No BLE edema. Pulmonary/Chest: Effort normal and breath sounds normal. No respiratory distress. Abdominal: Soft. Bowel sounds are normal, no distension. There is no tenderness. no masses Breast: no lumps or masses, no nipple discharge or rashes FEMALE GENITALIA:  Not done  RECTAL: not done  Musculoskeletal: Normal range of motion, no joint effusions. No gross deformities Neurological: he is alert and oriented to person, place, and time. No cranial nerve deficit. Coordination, balance, strength, speech and gait are normal.  Skin: Skin is warm and dry. No rash noted. No erythema.  Psychiatric: Patient has a normal mood and affect. behavior  is normal. Judgment and thought content normal.   Recent Results (from the past 2160 hour(s))  POCT Influenza A/B     Status: Normal   Collection Time: 04/03/22  4:07 PM  Result Value Ref Range   Influenza A, POC Negative Negative   Influenza B, POC Negative Negative  Iron and TIBC     Status: None   Collection Time: 05/30/22  2:16 PM  Result Value Ref Range   Iron 95 28 - 170 ug/dL   TIBC 406 250 - 450 ug/dL   Saturation Ratios 23 10.4 - 31.8 %   UIBC 311 ug/dL    Comment: Performed at [facility], [address] [place], [place] [address]  Ferritin     Status: None   Collection  Time: 05/30/22  2:16 PM  Result Value Ref Range   Ferritin 26 11 - 307 ng/mL    Comment: Performed at [facility], [address] [place], [place] [address]  CBC     Status: None   Collection Time: 05/30/22  2:16 PM  Result Value Ref Range   WBC 6.4 4.0 - 10.5 K/uL   RBC 4.11 3.87 - 5.11 MIL/uL   Hemoglobin 12.5 12.0 - 15.0 g/dL   HCT 37.1 36.0 - 46.0 %   MCV 90.3 80.0 - 100.0 fL   MCH 30.4 26.0 - 34.0 pg   MCHC 33.7 30.0 - 36.0 g/dL   RDW 13.2 11.5 - 15.5 %   Platelets 240 150 - 400 K/uL   nRBC 0.0 0.0 - 0.2 %    Comment: Performed at [facility], [address] [place], [place] [address]     Fall Risk:    05/01/2022    3:28 PM 01/23/2022    2:22 PM 12/04/2021   11:51 AM 08/07/2021   10:00 AM 07/07/2021    8:22 AM  Fall Risk   Falls in the past year? 0 0 0 0 0  Number falls in past yr: 0 0 0 0 0  Injury with Fall? 0 0 0 0 1  Risk for fall due to : No Fall Risks No Fall Risks No Fall Risks No Fall Risks   Follow up Falls prevention discussed Falls prevention discussed Falls prevention discussed Falls prevention discussed      Functional Status Survey: Is the patient deaf or have difficulty hearing?: No Does the patient have difficulty seeing, even when wearing glasses/contacts?: Yes Does the patient have difficulty concentrating, remembering, or making decisions?: No Does the patient have difficulty walking or climbing stairs?: Yes Does the patient have difficulty dressing or bathing?: No Does the patient have difficulty doing errands alone such as visiting a doctor's office or shopping?: No   Assessment & Plan  1. Well adult exam   2. Need for shingles vaccine  - Varicella-zoster vaccine IM    -USPSTF grade A and B recommendations reviewed with patient; age-appropriate recommendations, preventive care, screening tests, etc discussed and encouraged; healthy living encouraged; see AVS for patient education given to patient -Discussed importance  of 150 minutes of physical activity weekly, eat two servings of fish weekly, eat one serving of tree nuts ( cashews, pistachios, pecans, almonds.[name]) every other day, eat 6 servings of fruit/vegetables daily and drink plenty of water and avoid sweet beverages.   -Reviewed Health Maintenance: Yes.

## 2022-06-12 NOTE — Progress Notes (Unsigned)
Name: Savannah Manning   MRN: [id number]    DOB: 1961-01-09   Date:06/13/2022       Progress Note  Subjective  Chief Complaint  Follow Up  I connected with  Savannah Manning  on 06/13/22 at 11:40 AM EDT by a video enabled telemedicine application and verified that I am speaking with the correct person using two identifiers.  I discussed the limitations of evaluation and management by telemedicine and the availability of in person appointments. The patient expressed understanding and agreed to proceed with the virtual visit  Staff also discussed with the patient that there may be a patient responsible charge related to this service. Patient Location: at home  Provider [facility]  Additional Individuals present: none   HPI  MDD:  She wast taking seroquel prn only but would like to resume medication She tried Duloxetine but unable to tolerate because it caused nausea. She denies suicidal thoughts or ideation  Father has dementia and is stressful taking care of him. Her son is going to school in [place] and she worries about him She is also on productivity at work and is very stressful for him. She has been depression for a long time. We gave her Lexapro rx Spring 2023 but she states she didn't like how it made her feel and stopped taking it . She is taking Seroquel prn at night to sleep     Obesity/prediabetes: .  She has been obese since childhood. She has tried multiple diets in the past, she had gastric bypass in 2005. Weight before surgery was 314 lbs, she went down to 164 lbs, but has been gradually gaining weight. She states her weight has gone up since she started to work from home in  2010.  [facility], and was denied for a revision in the Summer 2018.  She has failed Metformin, started her on Ozempic  04/2017 her weight was 259 lbs, her weight went as low as 239 lbs. Her insurance has denied Ozempic, only paying for patients that have DM. She states is not covering Saxenda  again, we gave rx of Contrave Dec 2021 but she stopped due to cost. She gained more weight and we resumed Saxenda March 2023 her weight at the start of therapy was 278 lbs and yesterday in our office weight was down to 264 lbs - she is taking 1.2 mg, side effects are nausea but tolerable at the current dose . She ask for nausea medication but advised to go down on the dose to 0.6 and monitor   Asthma mild intermittent: no cough , wheezing or SOB   HTN:  She denies chest pain or palpitation. She is on Maxzide and norvasc , bp is at goal today continue current regiment. Explained Norvasc may cause edema, however cannot take ARB/ACE or a beta blocker due to bradycardia and allergies. She was seen by cardiologist and states sob has improved. He gave her lasix to take prn , she states feeling better She had a normal Echo , also low risk NM myocardium scan BP yesterday BP has been at goal   Vitamin D deficiency/ vitamin B12 deficiency : continue rx vitamin D, she has monthly B12 injections   Dumping syndrome: she states doing well most of the time, doing better   GERD: she is taking Omeprazole given by Dr. Allegra LaiVanga twice daily , she states controlling heart burn and indigestion   Patient Active Problem List   Diagnosis  Date Noted   Moderate major depression (HCC) 05/01/2022   Intertrigo 05/01/2022   Insomnia due to mental condition 05/01/2022   Mild intermittent asthma without complication 10/20/2020   Primary osteoarthritis of both knees 10/20/2020   Primary osteoarthritis of right knee 04/06/2020   Primary osteoarthritis of left knee 04/06/2020   Pre-diabetes 12/05/2018   Malabsorption of iron 11/07/2018   Chronic pain of right knee 07/26/2017   Vitamin D deficiency 04/24/2017   B12 deficiency 04/24/2017   Abnormal mammogram of right breast 01/01/2017   Asthma, mild persistent 12/17/2016   History of shoulder surgery 10/12/2016   Primary osteoarthritis of left shoulder 06/14/2016   Incomplete  tear of left rotator cuff 05/30/2016   Rotator cuff tendinitis 05/30/2016   History of bariatric surgery 05/04/2016   Allergic rhinitis 05/12/2015   Benign essential HTN 05/12/2015   Anxiety and depression 05/12/2015   Insomnia, persistent 05/12/2015   Dyslipidemia 05/12/2015   Edema extremities 05/12/2015   Gastroesophageal reflux disease without esophagitis 05/12/2015   Migraine 05/12/2015   Plantar fasciitis 05/12/2015   Umbilical hernia without obstruction and without gangrene 05/12/2015   Morbid obesity with BMI of 40.0-44.9, adult (HCC) 02/13/2008    Past Surgical History:  Procedure Laterality Date   ABDOMINAL HYSTERECTOMY  2012   CHOLECYSTECTOMY     COLONOSCOPY WITH PROPOFOL N/A 12/18/2019   Procedure: COLONOSCOPY WITH PROPOFOL;  Surgeon: Toney Reil, MD;  Location: [facility];  Service: Gastroenterology;  Laterality: N/A;   DILATION AND CURETTAGE OF UTERUS     ESOPHAGOGASTRODUODENOSCOPY (EGD) WITH PROPOFOL N/A 03/18/2020   Procedure: ESOPHAGOGASTRODUODENOSCOPY (EGD) WITH PROPOFOL;  Surgeon: Toney Reil, MD;  Location: [facility];  Service: Gastroenterology;  Laterality: N/A;   GASTRIC BYPASS  2005   INDUCED ABORTION N/A 1997   patient was about 22 weeks   POLYPECTOMY  12/18/2019   Procedure: POLYPECTOMY;  Surgeon: Toney Reil, MD;  Location: [facility];  Service: Gastroenterology;;   SHOULDER ARTHROSCOPY WITH OPEN ROTATOR CUFF REPAIR Left 06/14/2016   Procedure: SHOULDER ARTHROSCOPY WITH OPEN ROTATOR CUFF REPAIR, DECOMPRESSION, EXCISION LOOSE BODY, DEBRIDEMENT;  Surgeon: Christena Flake, MD;  Location: [facility];  Service: Orthopedics;  Laterality: Left;    Family History  Problem Relation Age of Onset   Hypertension Mother    Cancer Father        prostate   Heart disease Brother    Breast cancer Cousin 40       mat cousin    Social History   Socioeconomic History   Marital status: Single    Spouse name: Not on file    Number of children: 1   Years of education: Not on file   Highest education level: Not on file  Occupational History   Not on file  Tobacco Use   Smoking status: Never   Smokeless tobacco: Never  Vaping Use   Vaping Use: Never used  Substance and Sexual Activity   Alcohol use: No    Alcohol/week: 0.0 standard drinks of alcohol   Drug use: No   Sexual activity: Not Currently  Other Topics Concern   Not on file  Social History Narrative   Desk job/works for insurance; no smoking; no alcohol; lives in [place].    Social Determinants of Health   Financial Resource Strain: Low Risk  (06/12/2022)   Overall Financial Resource Strain (CARDIA)    Difficulty of Paying Living Expenses: Not hard at all  Food Insecurity: No Food Insecurity (06/12/2022)  Hunger Vital Sign    Worried About Running Out of Food in the Last Year: Never true    Ran Out of Food in the Last Year: Never true  Transportation Needs: No Transportation Needs (06/12/2022)   PRAPARE - Administrator, Civil Service (Medical): No    Lack of Transportation (Non-Medical): No  Physical Activity: Inactive (06/12/2022)   Exercise Vital Sign    Days of Exercise per Week: 0 days    Minutes of Exercise per Session: 0 min  Stress: Stress Concern Present (06/12/2022)   [employer] of Occupational Health - Occupational Stress Questionnaire    Feeling of Stress : To some extent  Social Connections: Moderately Integrated (06/12/2022)   Social Connection and Isolation Panel [NHANES]    Frequency of Communication with Friends and Family: More than three times a week    Frequency of Social Gatherings with Friends and Family: Not on file    Attends Religious Services: More than 4 times per year    Active Member of [employer] or Organizations: Yes    Attends Banker Meetings: 1 to 4 times per year    Marital Status: Never married  Intimate Partner Violence: Not At Risk (06/12/2022)   Humiliation, Afraid, Rape, and Kick  questionnaire    Fear of Current or Ex-Partner: No    Emotionally Abused: No    Physically Abused: No    Sexually Abused: No     Current Outpatient Medications:    acetaminophen (TYLENOL) 325 MG tablet, Take 325 mg by mouth every 6 (six) hours as needed. PRN, Disp: , Rfl:    albuterol (VENTOLIN HFA) 108 (90 Base) MCG/ACT inhaler, Inhale 2 puffs into the lungs every 6 (six) hours as needed for wheezing or shortness of breath., Disp: 18 g, Rfl: 0   amLODipine (NORVASC) 2.5 MG tablet, Take 1 tablet (2.5 mg total) by mouth daily., Disp: 90 tablet, Rfl: 1   Liraglutide -Weight Management (SAXENDA) 18 MG/3ML SOPN, Inject 0.6 mg into the skin daily., Disp: 15 mL, Rfl: 0   nystatin cream (MYCOSTATIN), Apply 1 Application topically 2 (two) times daily., Disp: 30 g, Rfl: 5   rosuvastatin (CRESTOR) 40 MG tablet, Take 1 tablet (40 mg total) by mouth daily., Disp: 90 tablet, Rfl: 2   triamterene-hydrochlorothiazide (MAXZIDE-25) 37.5-25 MG tablet, Take 1 tablet by mouth daily. In place of HCTZ 25 , BP medication, Disp: 90 tablet, Rfl: 1   Vitamin D, Ergocalciferol, (DRISDOL) 1.25 MG (50000 UNIT) CAPS capsule, Take 1 capsule (50,000 Units total) by mouth every 7 (seven) days., Disp: 12 capsule, Rfl: 1   aspirin EC 81 MG tablet, Take 1 tablet (81 mg total) by mouth daily. Swallow whole. (Patient not taking: Reported on 05/30/2022), Disp: , Rfl:    fluticasone (FLONASE) 50 MCG/ACT nasal spray, SHAKE LIQUID AND USE 2 SPRAYS IN EACH NOSTRIL DAILY (Patient not taking: Reported on 05/30/2022), Disp: 48 g, Rfl: 0   furosemide (LASIX) 20 MG tablet, Take 1 tablet (20 mg total) by mouth daily as needed. For leg swelling, Disp: 30 tablet, Rfl: 1   Insulin Pen Needle (NOVOFINE PEN NEEDLE) 32G X 6 MM MISC, 1 each by Does not apply route daily at 12 noon. (Patient not taking: Reported on 05/30/2022), Disp: 100 each, Rfl: 1   loratadine (CLARITIN) 10 MG tablet, Take 1 tablet (10 mg total) by mouth daily. (Patient not taking:  Reported on 05/30/2022), Disp: 90 tablet, Rfl: 1   omeprazole (PRILOSEC) 40 MG capsule,  TAKE 1 CAPSULE(40 MG) BY MOUTH TWICE DAILY BEFORE MEALS (Patient not taking: Reported on 05/30/2022), Disp: 180 capsule, Rfl: 0   QUEtiapine (SEROQUEL) 25 MG tablet, Take 1 tablet (25 mg total) by mouth at bedtime. (Patient not taking: Reported on 05/30/2022), Disp: 90 tablet, Rfl: 0  Allergies  Allergen Reactions   Ace Inhibitors     angioedema   Codeine Swelling   Valsartan Swelling    I personally reviewed active problem list, medication list, allergies, family history, social history, health maintenance with the patient/caregiver today.   ROS  Ten systems reviewed and is negative except as mentioned in HPI   Objective  Virtual encounter, vitals not obtained.  There is no height or weight on file to calculate BMI.  Physical Exam  Awake , alert and oriented   PHQ2/9:    06/13/2022   10:10 AM 06/12/2022    3:44 PM 05/01/2022    3:28 PM 01/23/2022    2:22 PM 12/04/2021   11:51 AM  Depression screen PHQ 2/9  Decreased Interest 1 1 1  0 0  Down, Depressed, Hopeless 0 0 1 0 0  PHQ - 2 Score 1 1 2  0 0  Altered sleeping 3 3 3  0 0  Tired, decreased energy 1 1 3  0 0  Change in appetite 0 0 0 0 0  Feeling bad or failure about yourself  0 0 3 0 0  Trouble concentrating 1 1 3  0 0  Moving slowly or fidgety/restless 0 0 0 0 0  Suicidal thoughts 0 0 0 0 0  PHQ-9 Score 6 6 14  0 0  Difficult doing work/chores Not difficult at all    Not difficult at all   PHQ-2/9 Result is positive.    Fall Risk:    06/13/2022   10:10 AM 05/01/2022    3:28 PM 01/23/2022    2:22 PM 12/04/2021   11:51 AM 08/07/2021   10:00 AM  Fall Risk   Falls in the past year? 0 0 0 0 0  Number falls in past yr:  0 0 0 0  Injury with Fall?  0 0 0 0  Risk for fall due to : No Fall Risks No Fall Risks No Fall Risks No Fall Risks No Fall Risks  Follow up Falls prevention discussed Falls prevention discussed Falls prevention  discussed Falls prevention discussed Falls prevention discussed     Assessment & Plan  1. Morbid obesity with BMI of 40.0-44.9, adult (HCC)  - Liraglutide -Weight Management (SAXENDA) 18 MG/3ML SOPN; Inject 0.6 mg into the skin daily.  Dispense: 15 mL; Refill: 0  2. Moderate major depression (HCC)  - buPROPion (WELLBUTRIN XL) 150 MG 24 hr tablet; Take 1 tablet (150 mg total) by mouth daily.  Dispense: 90 tablet; Refill: 0  3. Insomnia due to mental condition  Taking seroquel prn  4. Vitamin D deficiency  Continue supplementation   5. B12 deficiency   6. Benign essential HTN  - amLODipine (NORVASC) 2.5 MG tablet; Take 1 tablet (2.5 mg total) by mouth daily.  Dispense: 90 tablet; Refill: 1 - triamterene-hydrochlorothiazide (MAXZIDE-25) 37.5-25 MG tablet; Take 1 tablet by mouth daily. In place of HCTZ 25 , BP medication  Dispense: 90 tablet; Refill: 1  7. GERD without esophagitis  Stable on medication   8. Bilateral lower extremity edema    I discussed the assessment and treatment plan with the patient. The patient was provided an opportunity to ask questions and all were answered. The  patient agreed with the plan and demonstrated an understanding of the instructions.  The patient was advised to call back or seek an in-person evaluation if the symptoms worsen or if the condition fails to improve as anticipated.  I provided 25  minutes of non-face-to-face time during this encounter.

## 2022-06-13 ENCOUNTER — Encounter: Payer: Self-pay | Admitting: Family Medicine

## 2022-06-13 ENCOUNTER — Telehealth (INDEPENDENT_AMBULATORY_CARE_PROVIDER_SITE_OTHER): Payer: BC Managed Care – PPO | Admitting: Family Medicine

## 2022-06-13 DIAGNOSIS — R600 Localized edema: Secondary | ICD-10-CM

## 2022-06-13 DIAGNOSIS — F321 Major depressive disorder, single episode, moderate: Secondary | ICD-10-CM | POA: Diagnosis not present

## 2022-06-13 DIAGNOSIS — F5105 Insomnia due to other mental disorder: Secondary | ICD-10-CM

## 2022-06-13 DIAGNOSIS — I10 Essential (primary) hypertension: Secondary | ICD-10-CM

## 2022-06-13 DIAGNOSIS — E559 Vitamin D deficiency, unspecified: Secondary | ICD-10-CM

## 2022-06-13 DIAGNOSIS — K219 Gastro-esophageal reflux disease without esophagitis: Secondary | ICD-10-CM

## 2022-06-13 DIAGNOSIS — Z6841 Body Mass Index (BMI) 40.0 and over, adult: Secondary | ICD-10-CM

## 2022-06-13 DIAGNOSIS — E538 Deficiency of other specified B group vitamins: Secondary | ICD-10-CM

## 2022-06-13 MED ORDER — AMLODIPINE BESYLATE 2.5 MG PO TABS: 2.5000 mg | ORAL_TABLET | Freq: Every day | ORAL | 1 refills | Status: DC

## 2022-06-13 MED ORDER — BUPROPION HCL ER (XL) 150 MG PO TB24: 150.0000 mg | ORAL_TABLET | Freq: Every day | ORAL | 0 refills | Status: DC

## 2022-06-13 MED ORDER — SAXENDA 18 MG/3ML ~~LOC~~ SOPN: 0.6000 mg | PEN_INJECTOR | Freq: Every day | SUBCUTANEOUS | 0 refills | Status: DC

## 2022-06-13 MED ORDER — TRIAMTERENE-HCTZ 37.5-25 MG PO TABS: 1.0000 | ORAL_TABLET | Freq: Every day | ORAL | 1 refills | Status: DC

## 2022-06-29 ENCOUNTER — Ambulatory Visit: Payer: BC Managed Care – PPO

## 2022-07-30 ENCOUNTER — Encounter: Payer: Self-pay | Admitting: *Deleted

## 2022-07-30 ENCOUNTER — Telehealth: Payer: Self-pay | Admitting: *Deleted

## 2022-07-30 ENCOUNTER — Ambulatory Visit: Payer: BC Managed Care – PPO

## 2022-07-30 NOTE — Telephone Encounter (Signed)
Patient came over today to get labs but she thought she was getting a B12 injection also.  I got a secure chat message about this and when I went down she had already left.  I did call the patient and tell her that she was not getting any more B12 injections but she was supposed to be getting a B12 oral pill every day.  The patient thought that it was going to be sent to the pharmacy.  I told the patient that something she can get over-the-counter and told her I would send her a MyChart message telling her the exact amount and how often and how to do.  Patient will look into her MyChart and go get it and started immediately

## 2022-08-03 NOTE — Progress Notes (Deleted)
Name: Savannah Manning   MRN: [id number]    DOB: [birth year]   Date:08/03/2022       Progress Note  Subjective  Chief Complaint  Follow Up  HPI  MDD:  She wast taking seroquel prn only but would like to resume medication She tried Duloxetine but unable to tolerate because it caused nausea. She denies suicidal thoughts or ideation  Father has dementia and is stressful taking care of him. Her son is going to school in [place] and she worries about him She is also on productivity at work and is very stressful for him. She has been depression for a long time. We gave her Lexapro rx Spring 2023 but she states she didn't like how it made her feel and stopped taking it . She is taking Seroquel prn at night to sleep     Obesity/prediabetes: .  She has been obese since childhood. She has tried multiple diets in the past, she had gastric bypass in 2005. Weight before surgery was 314 lbs, she went down to 164 lbs, but has been gradually gaining weight. She states her weight has gone up since she started to work from home in  2010.  She had her surgery done at [facility], and was denied for a revision in the Summer 2018.  She has failed Metformin, started her on Ozempic  04/2017 her weight was 259 lbs, her weight went as low as 239 lbs. Her insurance has denied Ozempic, only paying for patients that have DM. She states is not covering Saxenda again, we gave rx of Contrave Dec 2021 but she stopped due to cost. She gained more weight and we resumed Saxenda March 2023 her weight at the start of therapy was 278 lbs and yesterday in our office weight was down to 264 lbs - she is taking 1.2 mg, side effects are nausea but tolerable at the current dose . She ask for nausea medication but advised to go down on the dose to 0.6 and monitor   Asthma mild intermittent: no cough , wheezing or SOB   HTN:  She denies chest pain or palpitation. She is on Maxzide and norvasc , bp is at goal today continue current regiment. Explained Norvasc  may cause edema, however cannot take ARB/ACE or a beta blocker due to bradycardia and allergies. She was seen by cardiologist and states sob has improved. He gave her lasix to take prn , she states feeling better She had a normal Echo , also low risk NM myocardium scan BP yesterday BP has been at goal   Vitamin D deficiency/ vitamin B12 deficiency : continue rx vitamin D, she has monthly B12 injections   Dumping syndrome: she states doing well most of the time, doing better   GERD: she is taking Omeprazole given by Dr. Allegra Lai twice daily , she states controlling heart burn and indigestion   Patient Active Problem List   Diagnosis Date Noted   Moderate major depression (HCC) 05/01/2022   Intertrigo 05/01/2022   Insomnia due to mental condition 05/01/2022   Mild intermittent asthma without complication 10/20/2020   Primary osteoarthritis of both knees 10/20/2020   Primary osteoarthritis of right knee 04/06/2020   Primary osteoarthritis of left knee 04/06/2020   Pre-diabetes 12/05/2018   Malabsorption of iron 11/07/2018   Chronic pain of right knee 07/26/2017   Vitamin D deficiency 04/24/2017   B12 deficiency 04/24/2017   Abnormal mammogram of right breast 01/01/2017   Asthma, mild persistent 12/17/2016  History of shoulder surgery 10/12/2016   Primary osteoarthritis of left shoulder 06/14/2016   Incomplete tear of left rotator cuff 05/30/2016   Rotator cuff tendinitis 05/30/2016   History of bariatric surgery 05/04/2016   Allergic rhinitis 05/12/2015   Benign essential HTN 05/12/2015   Dyslipidemia 05/12/2015   Bilateral lower extremity edema 05/12/2015   Gastroesophageal reflux disease without esophagitis 05/12/2015   Migraine 05/12/2015   Plantar fasciitis 05/12/2015   Umbilical hernia without obstruction and without gangrene 05/12/2015   Morbid obesity with BMI of 40.0-44.9, adult (HCC) 02/13/2008    Past Surgical History:  Procedure Laterality Date   ABDOMINAL HYSTERECTOMY   2012   CHOLECYSTECTOMY     COLONOSCOPY WITH PROPOFOL N/A 12/18/2019   Procedure: COLONOSCOPY WITH PROPOFOL;  Surgeon: Toney Reil, MD;  Location: [facility];  Service: Gastroenterology;  Laterality: N/A;   DILATION AND CURETTAGE OF UTERUS     ESOPHAGOGASTRODUODENOSCOPY (EGD) WITH PROPOFOL N/A 03/18/2020   Procedure: ESOPHAGOGASTRODUODENOSCOPY (EGD) WITH PROPOFOL;  Surgeon: Toney Reil, MD;  Location: [facility] ENDOSCOPY;  Service: Gastroenterology;  Laterality: N/A;   GASTRIC BYPASS  2005   INDUCED ABORTION N/A 1997   patient was about 22 weeks   POLYPECTOMY  12/18/2019   Procedure: POLYPECTOMY;  Surgeon: Toney Reil, MD;  Location: [facility];  Service: Gastroenterology;;   SHOULDER ARTHROSCOPY WITH OPEN ROTATOR CUFF REPAIR Left 06/14/2016   Procedure: SHOULDER ARTHROSCOPY WITH OPEN ROTATOR CUFF REPAIR, DECOMPRESSION, EXCISION LOOSE BODY, DEBRIDEMENT;  Surgeon: Christena Flake, MD;  Location: [facility] ORS;  Service: Orthopedics;  Laterality: Left;    Family History  Problem Relation Age of Onset   Hypertension Mother    Cancer Father        prostate   Heart disease Brother    Breast cancer Cousin 40       mat cousin    Social History   Tobacco Use   Smoking status: Never   Smokeless tobacco: Never  Substance Use Topics   Alcohol use: No    Alcohol/week: 0.0 standard drinks of alcohol     Current Outpatient Medications:    acetaminophen (TYLENOL) 325 MG tablet, Take 325 mg by mouth every 6 (six) hours as needed. PRN, Disp: , Rfl:    albuterol (VENTOLIN HFA) 108 (90 Base) MCG/ACT inhaler, Inhale 2 puffs into the lungs every 6 (six) hours as needed for wheezing or shortness of breath., Disp: 18 g, Rfl: 0   amLODipine (NORVASC) 2.5 MG tablet, Take 1 tablet (2.5 mg total) by mouth daily., Disp: 90 tablet, Rfl: 1   aspirin EC 81 MG tablet, Take 1 tablet (81 mg total) by mouth daily. Swallow whole. (Patient not taking: Reported on 05/30/2022), Disp: , Rfl:     buPROPion (WELLBUTRIN XL) 150 MG 24 hr tablet, Take 1 tablet (150 mg total) by mouth daily., Disp: 90 tablet, Rfl: 0   fluticasone (FLONASE) 50 MCG/ACT nasal spray, SHAKE LIQUID AND USE 2 SPRAYS IN EACH NOSTRIL DAILY (Patient not taking: Reported on 05/30/2022), Disp: 48 g, Rfl: 0   furosemide (LASIX) 20 MG tablet, Take 1 tablet (20 mg total) by mouth daily as needed. For leg swelling, Disp: 30 tablet, Rfl: 1   Insulin Pen Needle (NOVOFINE PEN NEEDLE) 32G X 6 MM MISC, 1 each by Does not apply route daily at 12 noon. (Patient not taking: Reported on 05/30/2022), Disp: 100 each, Rfl: 1   Liraglutide -Weight Management (SAXENDA) 18 MG/3ML SOPN, Inject 0.6 mg into the skin daily.,  Disp: 15 mL, Rfl: 0   loratadine (CLARITIN) 10 MG tablet, Take 1 tablet (10 mg total) by mouth daily. (Patient not taking: Reported on 05/30/2022), Disp: 90 tablet, Rfl: 1   nystatin cream (MYCOSTATIN), Apply 1 Application topically 2 (two) times daily., Disp: 30 g, Rfl: 5   omeprazole (PRILOSEC) 40 MG capsule, TAKE 1 CAPSULE(40 MG) BY MOUTH TWICE DAILY BEFORE MEALS (Patient not taking: Reported on 05/30/2022), Disp: 180 capsule, Rfl: 0   QUEtiapine (SEROQUEL) 25 MG tablet, Take 1 tablet (25 mg total) by mouth at bedtime. (Patient not taking: Reported on 05/30/2022), Disp: 90 tablet, Rfl: 0   rosuvastatin (CRESTOR) 40 MG tablet, Take 1 tablet (40 mg total) by mouth daily., Disp: 90 tablet, Rfl: 2   triamterene-hydrochlorothiazide (MAXZIDE-25) 37.5-25 MG tablet, Take 1 tablet by mouth daily. In place of HCTZ 25 , BP medication, Disp: 90 tablet, Rfl: 1   Vitamin D, Ergocalciferol, (DRISDOL) 1.25 MG (50000 UNIT) CAPS capsule, Take 1 capsule (50,000 Units total) by mouth every 7 (seven) days., Disp: 12 capsule, Rfl: 1  Allergies  Allergen Reactions   Ace Inhibitors     angioedema   Codeine Swelling   Valsartan Swelling    I personally reviewed active problem list, medication list, allergies, family history, social history,  health maintenance with the patient/caregiver today.   ROS  ***  Objective  There were no vitals filed for this visit.  There is no height or weight on file to calculate BMI.  Physical Exam ***  Recent Results (from the past 2160 hour(s))  Iron and TIBC     Status: None   Collection Time: 05/30/22  2:16 PM  Result Value Ref Range   Iron 95 28 - 170 ug/dL   TIBC 406 250 - 450 ug/dL   Saturation Ratios 23 10.4 - 31.8 %   UIBC 311 ug/dL    Comment: Performed at [facility], [address] [place], [place] [address]  Ferritin     Status: None   Collection Time: 05/30/22  2:16 PM  Result Value Ref Range   Ferritin 26 11 - 307 ng/mL    Comment: Performed at [facility], [address] [place], [place] [address]  CBC     Status: None   Collection Time: 05/30/22  2:16 PM  Result Value Ref Range   WBC 6.4 4.0 - 10.5 K/uL   RBC 4.11 3.87 - 5.11 MIL/uL   Hemoglobin 12.5 12.0 - 15.0 g/dL   HCT 37.1 36.0 - 46.0 %   MCV 90.3 80.0 - 100.0 fL   MCH 30.4 26.0 - 34.0 pg   MCHC 33.7 30.0 - 36.0 g/dL   RDW 13.2 11.5 - 15.5 %   Platelets 240 150 - 400 K/uL   nRBC 0.0 0.0 - 0.2 %    Comment: Performed at [facility], [address] [place], [place] [address]    PHQ2/9:    06/13/2022   10:10 AM 06/12/2022    3:44 PM 05/01/2022    3:28 PM 01/23/2022    2:22 PM 12/04/2021   11:51 AM  Depression screen PHQ 2/9  Decreased Interest 1 1 1  0 0  Down, Depressed, Hopeless 0 0 1 0 0  PHQ - 2 Score 1 1 2  0 0  Altered sleeping 3 3 3  0 0  Tired, decreased energy 1 1 3  0 0  Change in appetite 0 0 0 0 0  Feeling bad or failure about yourself  0  0 3 0 0  Trouble concentrating 1 1 3  0 0  Moving slowly or fidgety/restless 0 0 0 0 0  Suicidal thoughts 0 0 0 0 0  PHQ-9 Score 6 6 14  0 0  Difficult doing work/chores Not difficult at all    Not difficult at all    phq 9 is {gen pos   Fall Risk:    06/13/2022   10:10 AM 05/01/2022    3:28 PM 01/23/2022     2:22 PM 12/04/2021   11:51 AM 08/07/2021   10:00 AM  Fall Risk   Falls in the past year? 0 0 0 0 0  Number falls in past yr:  0 0 0 0  Injury with Fall?  0 0 0 0  Risk for fall due to : No Fall Risks No Fall Risks No Fall Risks No Fall Risks No Fall Risks  Follow up Falls prevention discussed Falls prevention discussed Falls prevention discussed Falls prevention discussed Falls prevention discussed      Functional Status Survey:      Assessment & Plan  *** There are no diagnoses linked to this encounter.

## 2022-08-06 ENCOUNTER — Ambulatory Visit: Payer: BC Managed Care – PPO | Admitting: Family Medicine

## 2022-08-07 NOTE — Progress Notes (Unsigned)
Name: Savannah Manning   MRN: [id number]    DOB: [birth year]   Date:08/08/2022       Progress Note  Subjective  Chief Complaint  Follow Up  HPI  MDD: Father has dementia and is stressful taking care of him. Her son is going to school in [place] and she worries about him She is also on productivity at work and is very stressful for her . She has been depression for a long time. We gave her Lexapro rx Spring 2023 but she states she didn't like how it made her feel and stopped taking it . She could not tolerate duloxetine - it caused nausea She is taking Seroquel prn at night to sleep and we added wellbutrin 06/2022, phq 9 is stil positive mostly due to problems with sleep and also lack of concentration.     Obesity/prediabetes: .  She has been obese since childhood. She has tried multiple diets in the past, she had gastric bypass in 2005. Weight before surgery was 314 lbs, she went down to 164 lbs, but has been gradually gaining weight. She states her weight has gone up since she started to work from home in  2010.  She had her surgery done at [facility], and was denied for a revision in the Summer 2018.  She has failed Metformin, started her on Ozempic  04/2017 her weight was 259 lbs, her weight went as low as 239 lbs. Her insurance has denied Ozempic, only paying for patients that have DM. She states is not covering Saxenda again, we gave rx of Contrave Dec 2021 but she stopped due to cost. She gained more weight and we resumed Saxenda March 2023 her weight at the start of therapy was 278 lbs and yesterday and it went down to 264 lbs August 2023 - she is still on 1.2 mg dose every other day to make it last due to nation wide shortage, weight is up to to 269 lbs, she also has difficulty tolerating higher dose because it causes nausea. Discussed adding Contrave today   Asthma mild intermittent: she has a dry daily cough that is worse at night, but no wheezing or SOB. Doing well at this time   HTN:  She denies chest  pain or palpitation. She is on Maxzide and norvasc , bp is at goal today continue current regiment. Explained Norvasc may cause edema, however cannot take ARB/ACE or a beta blocker due to bradycardia and allergies. She was seen by cardiologist and states sob has improved. He gave her lasix to take prn , she states feeling better She had a normal Echo , also low risk NM myocardium scan Continue current regiment   Vitamin D deficiency/ vitamin B12 deficiency : continue rx vitamin D, she was getting B12 injections, but is now taking SL B12  Dumping syndrome: she states doing well most of the time, unchanged   GERD: she is taking Omeprazole given by Dr. Allegra LaiVanga twice daily , she states controlling heart burn and indigestion  Stable.   Patient Active Problem List   Diagnosis Date Noted   Moderate major depression (HCC) 05/01/2022   Intertrigo 05/01/2022   Insomnia due to mental condition 05/01/2022   Mild intermittent asthma without complication 10/20/2020   Primary osteoarthritis of both knees 10/20/2020   Primary osteoarthritis of right knee 04/06/2020   Primary osteoarthritis of left knee 04/06/2020   Pre-diabetes 12/05/2018   Malabsorption of iron 11/07/2018   Chronic pain of right knee 07/26/2017  Vitamin D deficiency 04/24/2017   B12 deficiency 04/24/2017   Abnormal mammogram of right breast 01/01/2017   Asthma, mild persistent 12/17/2016   History of shoulder surgery 10/12/2016   Primary osteoarthritis of left shoulder 06/14/2016   Incomplete tear of left rotator cuff 05/30/2016   Rotator cuff tendinitis 05/30/2016   History of bariatric surgery 05/04/2016   Allergic rhinitis 05/12/2015   Benign essential HTN 05/12/2015   Dyslipidemia 05/12/2015   Bilateral lower extremity edema 05/12/2015   Gastroesophageal reflux disease without esophagitis 05/12/2015   Migraine 05/12/2015   Plantar fasciitis 05/12/2015   Umbilical hernia without obstruction and without gangrene 05/12/2015    Morbid obesity with BMI of 40.0-44.9, adult (HCC) 02/13/2008    Past Surgical History:  Procedure Laterality Date   ABDOMINAL HYSTERECTOMY  2012   CHOLECYSTECTOMY     COLONOSCOPY WITH PROPOFOL N/A 12/18/2019   Procedure: COLONOSCOPY WITH PROPOFOL;  Surgeon: Toney Reil, MD;  Location: [facility];  Service: Gastroenterology;  Laterality: N/A;   DILATION AND CURETTAGE OF UTERUS     ESOPHAGOGASTRODUODENOSCOPY (EGD) WITH PROPOFOL N/A 03/18/2020   Procedure: ESOPHAGOGASTRODUODENOSCOPY (EGD) WITH PROPOFOL;  Surgeon: Toney Reil, MD;  Location: [facility] ENDOSCOPY;  Service: Gastroenterology;  Laterality: N/A;   GASTRIC BYPASS  2005   INDUCED ABORTION N/A 1997   patient was about 22 weeks   POLYPECTOMY  12/18/2019   Procedure: POLYPECTOMY;  Surgeon: Toney Reil, MD;  Location: [facility];  Service: Gastroenterology;;   SHOULDER ARTHROSCOPY WITH OPEN ROTATOR CUFF REPAIR Left 06/14/2016   Procedure: SHOULDER ARTHROSCOPY WITH OPEN ROTATOR CUFF REPAIR, DECOMPRESSION, EXCISION LOOSE BODY, DEBRIDEMENT;  Surgeon: Christena Flake, MD;  Location: [facility] ORS;  Service: Orthopedics;  Laterality: Left;    Family History  Problem Relation Age of Onset   Hypertension Mother    Cancer Father        prostate   Heart disease Brother    Breast cancer Cousin 40       mat cousin    Social History   Tobacco Use   Smoking status: Never   Smokeless tobacco: Never  Substance Use Topics   Alcohol use: No    Alcohol/week: 0.0 standard drinks of alcohol     Current Outpatient Medications:    acetaminophen (TYLENOL) 325 MG tablet, Take 325 mg by mouth every 6 (six) hours as needed. PRN, Disp: , Rfl:    albuterol (VENTOLIN HFA) 108 (90 Base) MCG/ACT inhaler, Inhale 2 puffs into the lungs every 6 (six) hours as needed for wheezing or shortness of breath., Disp: 18 g, Rfl: 0   amLODipine (NORVASC) 2.5 MG tablet, Take 1 tablet (2.5 mg total) by mouth daily., Disp: 90 tablet, Rfl:  1   aspirin EC 81 MG tablet, Take 1 tablet (81 mg total) by mouth daily. Swallow whole., Disp: , Rfl:    buPROPion (WELLBUTRIN XL) 150 MG 24 hr tablet, Take 1 tablet (150 mg total) by mouth daily., Disp: 90 tablet, Rfl: 0   fluticasone (FLONASE) 50 MCG/ACT nasal spray, SHAKE LIQUID AND USE 2 SPRAYS IN EACH NOSTRIL DAILY, Disp: 48 g, Rfl: 0   Insulin Pen Needle (NOVOFINE PEN NEEDLE) 32G X 6 MM MISC, 1 each by Does not apply route daily at 12 noon., Disp: 100 each, Rfl: 1   Liraglutide -Weight Management (SAXENDA) 18 MG/3ML SOPN, Inject 0.6 mg into the skin daily., Disp: 15 mL, Rfl: 0   loratadine (CLARITIN) 10 MG tablet, Take 1 tablet (10 mg total) by  mouth daily., Disp: 90 tablet, Rfl: 1   nystatin cream (MYCOSTATIN), Apply 1 Application topically 2 (two) times daily., Disp: 30 g, Rfl: 5   omeprazole (PRILOSEC) 40 MG capsule, TAKE 1 CAPSULE(40 MG) BY MOUTH TWICE DAILY BEFORE MEALS, Disp: 180 capsule, Rfl: 0   QUEtiapine (SEROQUEL) 25 MG tablet, Take 1 tablet (25 mg total) by mouth at bedtime., Disp: 90 tablet, Rfl: 0   triamterene-hydrochlorothiazide (MAXZIDE-25) 37.5-25 MG tablet, Take 1 tablet by mouth daily. In place of HCTZ 25 , BP medication, Disp: 90 tablet, Rfl: 1   Vitamin D, Ergocalciferol, (DRISDOL) 1.25 MG (50000 UNIT) CAPS capsule, Take 1 capsule (50,000 Units total) by mouth every 7 (seven) days., Disp: 12 capsule, Rfl: 1   furosemide (LASIX) 20 MG tablet, Take 1 tablet (20 mg total) by mouth daily as needed. For leg swelling, Disp: 30 tablet, Rfl: 1   rosuvastatin (CRESTOR) 40 MG tablet, Take 1 tablet (40 mg total) by mouth daily., Disp: 90 tablet, Rfl: 2  Allergies  Allergen Reactions   Ace Inhibitors     angioedema   Codeine Swelling   Valsartan Swelling    I personally reviewed active problem list, medication list, allergies, family history, social history, health maintenance with the patient/caregiver today.   ROS  Constitutional: Negative for fever or weight change.   Respiratory: Negative for cough and shortness of breath.   Cardiovascular: Negative for chest pain or palpitations.  Gastrointestinal: Negative for abdominal pain, no bowel changes.  Musculoskeletal: Negative for gait problem or joint swelling.  Skin: Negative for rash.  Neurological: Negative for dizziness or headache.  No other specific complaints in a complete review of systems (except as listed in HPI above).   Objective  Vitals:   08/08/22 1340  BP: 122/78  Pulse: 78  Resp: 16  SpO2: 98%  Weight: 269 lb (122 kg)  Height: 5\' 7"  (1.702 m)    Body mass index is 42.13 kg/m.  Physical Exam  Constitutional: Patient appears well-developed and well-nourished. Obese  No distress.  HEENT: head atraumatic, normocephalic, pupils equal and reactive to light, neck supple Cardiovascular: Normal rate, regular rhythm and normal heart sounds.  No murmur heard. No BLE edema. Pulmonary/Chest: Effort normal and breath sounds normal. No respiratory distress. Abdominal: Soft.  There is no tenderness. Psychiatric: Patient has a normal mood and affect. behavior is normal. Judgment and thought content normal.   Recent Results (from the past 2160 hour(s))  Iron and TIBC     Status: None   Collection Time: 05/30/22  2:16 PM  Result Value Ref Range   Iron 95 28 - 170 ug/dL   TIBC 06/01/22 390 - 300 ug/dL   Saturation Ratios 23 10.4 - 31.8 %   UIBC 311 ug/dL    Comment: Performed at [facility], [address] [place], [place]  Ferritin     Status: None   Collection Time: 05/30/22  2:16 PM  Result Value Ref Range   Ferritin 26 11 - 307 ng/mL    Comment: Performed at [facility], [address] [place], [place]  CBC     Status: None   Collection Time: 05/30/22  2:16 PM  Result Value Ref Range   WBC 6.4 4.0 - 10.5 K/uL   RBC 4.11 3.87 - 5.11 MIL/uL   Hemoglobin 12.5 12.0 - 15.0 g/dL   HCT 06/01/22 35.4 - 56.2 %   MCV 90.3 80.0 - 100.0 fL   MCH 30.4 26.0  -  34.0 pg   MCHC 33.7 30.0 - 36.0 g/dL   RDW 13.2 11.5 - 15.5 %   Platelets 240 150 - 400 K/uL   nRBC 0.0 0.0 - 0.2 %    Comment: Performed at [facility], [address] [place], [place] [address]    PHQ2/9:    08/08/2022    1:49 PM 06/13/2022   10:10 AM 06/12/2022    3:44 PM 05/01/2022    3:28 PM 01/23/2022    2:22 PM  Depression screen PHQ 2/9  Decreased Interest 0 1 1 1  0  Down, Depressed, Hopeless 0 0 0 1 0  PHQ - 2 Score 0 1 1 2  0  Altered sleeping 3 3 3 3  0  Tired, decreased energy 1 1 1 3  0  Change in appetite 0 0 0 0 0  Feeling bad or failure about yourself  0 0 0 3 0  Trouble concentrating 3 1 1 3  0  Moving slowly or fidgety/restless 0 0 0 0 0  Suicidal thoughts 0 0 0 0 0  PHQ-9 Score 7 6 6 14  0  Difficult doing work/chores  Not difficult at all       phq 9 is positive   Fall Risk:    08/08/2022    1:39 PM 06/13/2022   10:10 AM 05/01/2022    3:28 PM 01/23/2022    2:22 PM 12/04/2021   11:51 AM  Fall Risk   Falls in the past year? 0 0 0 0 0  Number falls in past yr: 0  0 0 0  Injury with Fall? 0  0 0 0  Risk for fall due to : No Fall Risks No Fall Risks No Fall Risks No Fall Risks No Fall Risks  Follow up Falls prevention discussed Falls prevention discussed Falls prevention discussed Falls prevention discussed Falls prevention discussed      Functional Status Survey: Is the patient deaf or have difficulty hearing?: No Does the patient have difficulty seeing, even when wearing glasses/contacts?: No Does the patient have difficulty concentrating, remembering, or making decisions?: Yes Does the patient have difficulty walking or climbing stairs?: No Does the patient have difficulty dressing or bathing?: No Does the patient have difficulty doing errands alone such as visiting a doctor's office or shopping?: No    Assessment & Plan  1. Moderate major depression (HCC)  We will increase dose of Wellbutrin  - buPROPion (WELLBUTRIN XL) 300 MG 24 hr  tablet; Take 1 tablet (300 mg total) by mouth daily.  Dispense: 90 tablet; Refill: 1  2. Morbid obesity with BMI of 40.0-44.9, adult ([place])  Needs to bump dose of Saxenda and eat healthier   3. B12 deficiency  Managed by hematologist and on oral supplementation now   4. Insomnia due to mental condition  Advised to take seroquel every night since poor sleep can cause weight gain  5. Vitamin D deficiency  - Vitamin D, Ergocalciferol, (DRISDOL) 1.25 MG (50000 UNIT) CAPS capsule; Take 1 capsule (50,000 Units total) by mouth every 7 (seven) days.  Dispense: 12 capsule; Refill: 1  6. Gastroesophageal reflux disease without esophagitis  - omeprazole (PRILOSEC) 40 MG capsule; Take 1 capsule (40 mg total) by mouth daily.  Dispense: 90 capsule; Refill: 1  7. Benign essential HTN  At goal   8. History of bariatric surgery   9. Dyspnea on exertion  Improved, and likely multifactorial   10. Mild intermittent asthma without complication  - montelukast (SINGULAIR) 10 MG  tablet; Take 1 tablet (10 mg total) by mouth at bedtime.  Dispense: 90 tablet; Refill: 1

## 2022-08-08 ENCOUNTER — Ambulatory Visit (INDEPENDENT_AMBULATORY_CARE_PROVIDER_SITE_OTHER): Payer: BC Managed Care – PPO | Admitting: Family Medicine

## 2022-08-08 ENCOUNTER — Encounter: Payer: Self-pay | Admitting: Family Medicine

## 2022-08-08 VITALS — BP 122/78 | HR 78 | Resp 16 | Ht 67.0 in | Wt 269.0 lb

## 2022-08-08 DIAGNOSIS — F5105 Insomnia due to other mental disorder: Secondary | ICD-10-CM

## 2022-08-08 DIAGNOSIS — I10 Essential (primary) hypertension: Secondary | ICD-10-CM

## 2022-08-08 DIAGNOSIS — E559 Vitamin D deficiency, unspecified: Secondary | ICD-10-CM

## 2022-08-08 DIAGNOSIS — E538 Deficiency of other specified B group vitamins: Secondary | ICD-10-CM | POA: Diagnosis not present

## 2022-08-08 DIAGNOSIS — Z6841 Body Mass Index (BMI) 40.0 and over, adult: Secondary | ICD-10-CM

## 2022-08-08 DIAGNOSIS — Z9884 Bariatric surgery status: Secondary | ICD-10-CM

## 2022-08-08 DIAGNOSIS — J4520 Mild intermittent asthma, uncomplicated: Secondary | ICD-10-CM

## 2022-08-08 DIAGNOSIS — K219 Gastro-esophageal reflux disease without esophagitis: Secondary | ICD-10-CM

## 2022-08-08 DIAGNOSIS — F321 Major depressive disorder, single episode, moderate: Secondary | ICD-10-CM

## 2022-08-08 DIAGNOSIS — R0609 Other forms of dyspnea: Secondary | ICD-10-CM

## 2022-08-08 MED ORDER — VITAMIN D (ERGOCALCIFEROL) 1.25 MG (50000 UNIT) PO CAPS: 50000.0000 [IU] | ORAL_CAPSULE | ORAL | 1 refills | Status: DC

## 2022-08-08 MED ORDER — MONTELUKAST SODIUM 10 MG PO TABS: 10.0000 mg | ORAL_TABLET | Freq: Every day | ORAL | 1 refills | Status: DC

## 2022-08-08 MED ORDER — OMEPRAZOLE 40 MG PO CPDR: 40.0000 mg | DELAYED_RELEASE_CAPSULE | Freq: Every day | ORAL | 1 refills | Status: DC

## 2022-08-08 MED ORDER — BUPROPION HCL ER (XL) 300 MG PO TB24: 300.0000 mg | ORAL_TABLET | Freq: Every day | ORAL | 1 refills | Status: DC

## 2022-08-29 ENCOUNTER — Ambulatory Visit: Payer: BC Managed Care – PPO

## 2022-09-24 ENCOUNTER — Encounter: Payer: Self-pay | Admitting: Family Medicine

## 2022-09-30 ENCOUNTER — Other Ambulatory Visit: Payer: Self-pay | Admitting: *Deleted

## 2022-09-30 DIAGNOSIS — E538 Deficiency of other specified B group vitamins: Secondary | ICD-10-CM

## 2022-09-30 DIAGNOSIS — Z9884 Bariatric surgery status: Secondary | ICD-10-CM

## 2022-10-01 ENCOUNTER — Encounter: Payer: Self-pay | Admitting: Oncology

## 2022-10-01 ENCOUNTER — Ambulatory Visit: Payer: BC Managed Care – PPO

## 2022-10-01 ENCOUNTER — Inpatient Hospital Stay: Payer: BC Managed Care – PPO | Attending: Oncology

## 2022-10-01 ENCOUNTER — Encounter: Payer: Self-pay | Admitting: Cardiology

## 2022-10-01 ENCOUNTER — Ambulatory Visit: Payer: BC Managed Care – PPO | Attending: Cardiology | Admitting: Cardiology

## 2022-10-01 VITALS — BP 146/92 | HR 88 | Ht 67.0 in | Wt 271.0 lb

## 2022-10-01 DIAGNOSIS — I10 Essential (primary) hypertension: Secondary | ICD-10-CM

## 2022-10-01 DIAGNOSIS — D508 Other iron deficiency anemias: Secondary | ICD-10-CM | POA: Insufficient documentation

## 2022-10-01 DIAGNOSIS — E538 Deficiency of other specified B group vitamins: Secondary | ICD-10-CM | POA: Diagnosis not present

## 2022-10-01 DIAGNOSIS — E782 Mixed hyperlipidemia: Secondary | ICD-10-CM

## 2022-10-01 DIAGNOSIS — D509 Iron deficiency anemia, unspecified: Secondary | ICD-10-CM

## 2022-10-01 DIAGNOSIS — R0609 Other forms of dyspnea: Secondary | ICD-10-CM

## 2022-10-01 DIAGNOSIS — Z9884 Bariatric surgery status: Secondary | ICD-10-CM | POA: Diagnosis not present

## 2022-10-01 LAB — IRON AND TIBC
Iron: 95 ug/dL (ref 28–170)
Saturation Ratios: 22 % (ref 10.4–31.8)
TIBC: 427 ug/dL (ref 250–450)
UIBC: 332 ug/dL

## 2022-10-01 LAB — CBC
HCT: 38.1 % (ref 36.0–46.0)
Hemoglobin: 12.9 g/dL (ref 12.0–15.0)
MCH: 29.8 pg (ref 26.0–34.0)
MCHC: 33.9 g/dL (ref 30.0–36.0)
MCV: 88 fL (ref 80.0–100.0)
Platelets: 244 10*3/uL (ref 150–400)
RBC: 4.33 MIL/uL (ref 3.87–5.11)
RDW: 13.1 % (ref 11.5–15.5)
WBC: 5.2 10*3/uL (ref 4.0–10.5)
nRBC: 0 % (ref 0.0–0.2)

## 2022-10-01 LAB — VITAMIN B12: Vitamin B-12: 287 pg/mL (ref 180–914)

## 2022-10-01 LAB — FERRITIN: Ferritin: 21 ng/mL (ref 11–307)

## 2022-10-01 MED ORDER — FUROSEMIDE 20 MG PO TABS: 20.0000 mg | ORAL_TABLET | Freq: Every day | ORAL | 5 refills | Status: DC | PRN

## 2022-10-01 MED ORDER — ROSUVASTATIN CALCIUM 40 MG PO TABS: 40.0000 mg | ORAL_TABLET | Freq: Every day | ORAL | 1 refills | Status: DC

## 2022-10-01 NOTE — Patient Instructions (Signed)
Medication Instructions:   Your physician recommends that you continue on your current medications as directed. Please refer to the Current Medication list given to you today.   *If you need a refill on your cardiac medications before your next appointment, please call your pharmacy*   Lab Work:  Your physician recommends that you return for a FASTING lipid profile:  This week sometime  - You will need to be fasting. Please do not have anything to eat or drink after midnight the morning you have the lab work. You may only have water or black coffee with no cream or sugar.   - Please go to the [facility]. You will check in at the front desk to the right as you walk into the atrium. Valet Parking is offered if needed. - No appointment needed. You may go any day between 7 am and 6 pm.      Follow-Up: At [facility], you and your health needs are our priority.  As part of our continuing mission to provide you with exceptional heart care, we have created designated Provider Care Teams.  These Care Teams include your primary Cardiologist (physician) and Advanced Practice Providers (APPs -  Physician Assistants and Nurse Practitioners) who all work together to provide you with the care you need, when you need it.  We recommend signing up for the patient portal called "MyChart".  Sign up information is provided on this After Visit Summary.  MyChart is used to connect with patients for Virtual Visits (Telemedicine).  Patients are able to view lab/test results, encounter notes, upcoming appointments, etc.  Non-urgent messages can be sent to your provider as well.   To learn more about what you can do with MyChart, go to [email or web address].    Your next appointment:   6 month(s)  The format for your next appointment:   In Person  Provider:   Debbe Odea, MD    Other Instructions   Important Information About Sugar

## 2022-10-01 NOTE — Progress Notes (Signed)
Cardiology Office Note:    Date:  10/01/2022   ID:  Savannah Manning, DOB [birth year], MRN [id number]  PCP:  Steele Sizer, MD   [facility] Providers Cardiologist:  None     Referring MD: Steele Sizer, MD   Chief Complaint  Patient presents with   Follow-up    6 month f/u, no new cardiac concerns    History of Present Illness:    Savannah Manning is a 61 y.o. female with a hx of hypertension, hyperlipidemia, obesity, who presents for follow-up.  Previously seen due to shortness of breath and leg edema.  Echo and Myoview was obtained to evaluate cardiac function.  No significant abnormalities noted on stress testing or echo.  Shortness of breath likely from deconditioning.  Overall this is improved.  She is compliant with medications as prescribed.  No new cardiac concerns at this time.   Prior notes Echo 02/2022 EF 55 to 60% Lexiscan Myoview 02/2022, no evidence for ischemia, low risk study   Past Medical History:  Diagnosis Date   Anemia    Arthritis    Asthma    GERD (gastroesophageal reflux disease)    Headache    Hypertension    Iron deficiency anemia 12/05/2018   Wears contact lenses     Past Surgical History:  Procedure Laterality Date   ABDOMINAL HYSTERECTOMY  2012   CHOLECYSTECTOMY     COLONOSCOPY WITH PROPOFOL N/A 12/18/2019   Procedure: COLONOSCOPY WITH PROPOFOL;  Surgeon: Lin Landsman, MD;  Location: [place];  Service: Gastroenterology;  Laterality: N/A;   DILATION AND CURETTAGE OF UTERUS     ESOPHAGOGASTRODUODENOSCOPY (EGD) WITH PROPOFOL N/A 03/18/2020   Procedure: ESOPHAGOGASTRODUODENOSCOPY (EGD) WITH PROPOFOL;  Surgeon: Lin Landsman, MD;  Location: [facility];  Service: Gastroenterology;  Laterality: N/A;   GASTRIC BYPASS  2005   INDUCED ABORTION N/A 1997   patient was about 22 weeks   POLYPECTOMY  12/18/2019   Procedure: POLYPECTOMY;  Surgeon: Lin Landsman, MD;  Location: [place];  Service:  Gastroenterology;;   SHOULDER ARTHROSCOPY WITH OPEN ROTATOR CUFF REPAIR Left 06/14/2016   Procedure: SHOULDER ARTHROSCOPY WITH OPEN ROTATOR CUFF REPAIR, DECOMPRESSION, EXCISION LOOSE BODY, DEBRIDEMENT;  Surgeon: Corky Mull, MD;  Location: [facility];  Service: Orthopedics;  Laterality: Left;    Current Medications: Current Meds  Medication Sig   acetaminophen (TYLENOL) 325 MG tablet Take 325 mg by mouth every 6 (six) hours as needed. PRN   albuterol (VENTOLIN HFA) 108 (90 Base) MCG/ACT inhaler Inhale 2 puffs into the lungs every 6 (six) hours as needed for wheezing or shortness of breath.   amLODipine (NORVASC) 2.5 MG tablet Take 1 tablet (2.5 mg total) by mouth daily.   aspirin EC 81 MG tablet Take 1 tablet (81 mg total) by mouth daily. Swallow whole.   buPROPion (WELLBUTRIN XL) 300 MG 24 hr tablet Take 1 tablet (300 mg total) by mouth daily.   fluticasone (FLONASE) 50 MCG/ACT nasal spray SHAKE LIQUID AND USE 2 SPRAYS IN EACH NOSTRIL DAILY   Insulin Pen Needle (NOVOFINE PEN NEEDLE) 32G X 6 MM MISC 1 each by Does not apply route daily at 12 noon.   Liraglutide -Weight Management (SAXENDA) 18 MG/3ML SOPN Inject 0.6 mg into the skin daily.   loratadine (CLARITIN) 10 MG tablet Take 1 tablet (10 mg total) by mouth daily.   montelukast (SINGULAIR) 10 MG tablet Take 1 tablet (10 mg total) by mouth at bedtime.   nystatin cream (  MYCOSTATIN) Apply 1 Application topically 2 (two) times daily.   omeprazole (PRILOSEC) 40 MG capsule Take 1 capsule (40 mg total) by mouth daily.   QUEtiapine (SEROQUEL) 25 MG tablet Take 1 tablet (25 mg total) by mouth at bedtime.   triamterene-hydrochlorothiazide (MAXZIDE-25) 37.5-25 MG tablet Take 1 tablet by mouth daily. In place of HCTZ 25 , BP medication   Vitamin D, Ergocalciferol, (DRISDOL) 1.25 MG (50000 UNIT) CAPS capsule Take 1 capsule (50,000 Units total) by mouth every 7 (seven) days.     Allergies:   Ace inhibitors, Codeine, and Valsartan   Social History    Socioeconomic History   Marital status: Single    Spouse name: Not on file   Number of children: 1   Years of education: Not on file   Highest education level: Not on file  Occupational History   Not on file  Tobacco Use   Smoking status: Never   Smokeless tobacco: Never  Vaping Use   Vaping Use: Never used  Substance and Sexual Activity   Alcohol use: No    Alcohol/week: 0.0 standard drinks of alcohol   Drug use: No   Sexual activity: Not Currently  Other Topics Concern   Not on file  Social History Narrative   Desk job/works for insurance; no smoking; no alcohol; lives in [place].    Social Determinants of Health   Financial Resource Strain: Low Risk  (06/12/2022)   Overall Financial Resource Strain (CARDIA)    Difficulty of Paying Living Expenses: Not hard at all  Food Insecurity: No Food Insecurity (06/12/2022)   Hunger Vital Sign    Worried About Running Out of Food in the Last Year: Never true    Ran Out of Food in the Last Year: Never true  Transportation Needs: No Transportation Needs (06/12/2022)   PRAPARE - Administrator, Civil Service (Medical): No    Lack of Transportation (Non-Medical): No  Physical Activity: Inactive (06/12/2022)   Exercise Vital Sign    Days of Exercise per Week: 0 days    Minutes of Exercise per Session: 0 min  Stress: Stress Concern Present (06/12/2022)   [employer] of Occupational Health - Occupational Stress Questionnaire    Feeling of Stress : To some extent  Social Connections: Moderately Integrated (06/12/2022)   Social Connection and Isolation Panel [NHANES]    Frequency of Communication with Friends and Family: More than three times a week    Frequency of Social Gatherings with Friends and Family: Not on file    Attends Religious Services: More than 4 times per year    Active Member of [employer] or Organizations: Yes    Attends Banker Meetings: 1 to 4 times per year    Marital Status: Never married      Family History: The patient's family history includes Breast cancer (age of onset: 56) in her cousin; Cancer in her father; Heart disease in her brother; Hypertension in her mother.  ROS:   Please see the history of present illness.     All other systems reviewed and are negative.  EKGs/Labs/Other Studies Reviewed:    The following studies were reviewed today:   EKG:  EKG is  ordered today.  The ekg ordered today demonstrates normal sinus rhythm, normal ECG.  Recent Labs: 01/23/2022: ALT 10; BUN 13; Creat 0.84; Potassium 4.6; Sodium 139 10/01/2022: Hemoglobin 12.9; Platelets 244  Recent Lipid Panel    Component Value Date/Time   CHOL 266 (H) 01/23/2022  1534   CHOL 205 (H) 05/04/2016 1053   TRIG 131 01/23/2022 1534   HDL 68 01/23/2022 1534   HDL 64 05/04/2016 1053   CHOLHDL 3.9 01/23/2022 1534   VLDL 21 05/01/2017 0819   LDLCALC 171 (H) 01/23/2022 1534     Risk Assessment/Calculations:          Physical Exam:    VS:  BP (!) 146/92 (BP Location: Left Arm, Patient Position: Sitting, Cuff Size: Large)   Pulse 88   Ht 5\' 7"  (1.702 m)   Wt 271 lb (122.9 kg)   SpO2 97%   BMI 42.44 kg/m     Wt Readings from Last 3 Encounters:  10/01/22 271 lb (122.9 kg)  08/08/22 269 lb (122 kg)  06/12/22 264 lb 14.4 oz (120.2 kg)     GEN:  Well nourished, well developed in no acute distress HEENT: Normal NECK: No JVD; No carotid bruits CARDIAC: RRR, no murmurs, rubs, gallops RESPIRATORY:  Clear to auscultation without rales, wheezing or rhonchi  ABDOMEN: Soft, non-tender, non-distended MUSCULOSKELETAL:  no edema; No deformity  SKIN: Warm and dry NEUROLOGIC:  Alert and oriented x 3 PSYCHIATRIC:  Normal affect   ASSESSMENT:    1. Dyspnea on exertion   2. Primary hypertension   3. Hyperlipidemia, mixed   4. Morbid obesity ([place])    PLAN:    In order of problems listed above:  Dyspnea on exertion, possibly from deconditioning, morbid obesity.  Echo and Myoview  unrevealing, EF 55 to 60% Hypertension, BP elevated, usually controlled.  Continue HCTZ, Norvasc. Hyperlipidemia, continue Crestor 40 mg daily.  Repeat fasting lipid profile. Morbid obesity, likely contributing to shortness of breath, low-calorie diet, weight loss advised.  Follow-up in 6 months.      Medication Adjustments/Labs and Tests Ordered: Current medicines are reviewed at length with the patient today.  Concerns regarding medicines are outlined above.  Orders Placed This Encounter  Procedures   Lipid panel   EKG 12-Lead   Meds ordered this encounter  Medications   furosemide (LASIX) 20 MG tablet    Sig: Take 1 tablet (20 mg total) by mouth daily as needed. For leg swelling    Dispense:  30 tablet    Refill:  5   rosuvastatin (CRESTOR) 40 MG tablet    Sig: Take 1 tablet (40 mg total) by mouth daily.    Dispense:  90 tablet    Refill:  1    Dose increase    Patient Instructions  Medication Instructions:   Your physician recommends that you continue on your current medications as directed. Please refer to the Current Medication list given to you today.   *If you need a refill on your cardiac medications before your next appointment, please call your pharmacy*   Lab Work:  Your physician recommends that you return for a FASTING lipid profile:  This week sometime  - You will need to be fasting. Please do not have anything to eat or drink after midnight the morning you have the lab work. You may only have water or black coffee with no cream or sugar.   - Please go to the [facility]. You will check in at the front desk to the right as you walk into the atrium. Valet Parking is offered if needed. - No appointment needed. You may go any day between 7 am and 6 pm.      Follow-Up: At [facility], you and your health needs are our  priority.  As part of our continuing mission to provide you with exceptional heart care, we have created designated  Provider Care Teams.  These Care Teams include your primary Cardiologist (physician) and Advanced Practice Providers (APPs -  Physician Assistants and Nurse Practitioners) who all work together to provide you with the care you need, when you need it.  We recommend signing up for the patient portal called "MyChart".  Sign up information is provided on this After Visit Summary.  MyChart is used to connect with patients for Virtual Visits (Telemedicine).  Patients are able to view lab/test results, encounter notes, upcoming appointments, etc.  Non-urgent messages can be sent to your provider as well.   To learn more about what you can do with MyChart, go to [email or web address].    Your next appointment:   6 month(s)  The format for your next appointment:   In Person  Provider:   Kate Sable, MD    Other Instructions   Important Information About Sugar         Signed, Kate Sable, MD  10/01/2022 3:42 PM    [place]

## 2022-10-04 ENCOUNTER — Other Ambulatory Visit
Admission: RE | Admit: 2022-10-04 | Discharge: 2022-10-04 | Disposition: A | Discharge status: Home / Self Care | Payer: BC Managed Care – PPO | Attending: Cardiology | Admitting: Cardiology

## 2022-10-04 DIAGNOSIS — E782 Mixed hyperlipidemia: Secondary | ICD-10-CM | POA: Insufficient documentation

## 2022-10-04 LAB — LIPID PANEL
Cholesterol: 230 mg/dL — ABNORMAL HIGH (ref 0–200)
HDL: 65 mg/dL (ref >40)
LDL Cholesterol: 135 mg/dL — ABNORMAL HIGH (ref 0–99)
Total CHOL/HDL Ratio: 3.5 RATIO
Triglycerides: 149 mg/dL (ref <150)
VLDL: 30 mg/dL (ref 0–40)

## 2022-10-05 ENCOUNTER — Other Ambulatory Visit: Payer: Self-pay | Admitting: Oncology

## 2022-10-05 ENCOUNTER — Encounter: Payer: Self-pay | Admitting: Family Medicine

## 2022-10-16 ENCOUNTER — Encounter: Payer: Self-pay | Admitting: Family Medicine

## 2022-10-18 ENCOUNTER — Inpatient Hospital Stay: Payer: BC Managed Care – PPO | Attending: Oncology

## 2022-10-18 VITALS — BP 153/90 | HR 55 | Temp 97.2°F | Resp 18

## 2022-10-18 DIAGNOSIS — D508 Other iron deficiency anemias: Secondary | ICD-10-CM | POA: Diagnosis not present

## 2022-10-18 DIAGNOSIS — E538 Deficiency of other specified B group vitamins: Secondary | ICD-10-CM | POA: Insufficient documentation

## 2022-10-18 MED ORDER — CYANOCOBALAMIN 1000 MCG/ML IJ SOLN
1000.0000 ug | Freq: Once | INTRAMUSCULAR | Status: AC
  Administered 2022-10-18: 1000 ug via INTRAMUSCULAR
  Filled 2022-10-18: qty 1

## 2022-10-18 MED ORDER — SODIUM CHLORIDE 0.9 % IV SOLN
200.0000 mg | INTRAVENOUS | Status: DC
  Administered 2022-10-18: 200 mg via INTRAVENOUS
  Filled 2022-10-18: qty 200

## 2022-10-18 MED ORDER — SODIUM CHLORIDE 0.9 % IV SOLN
Freq: Once | INTRAVENOUS | Status: AC
  Filled 2022-10-18: qty 250

## 2022-10-23 NOTE — Progress Notes (Unsigned)
Name: Savannah Manning   MRN: [id number]    DOB: 1961/03/18   Date:10/24/2022       Progress Note  Subjective  Chief Complaint  Heel Pain  HPI  Left heel pain: she states symptoms started suddenly a couple of weeks ago. Symptoms are worse when she first steps down in the mornings but also after she has been sitting for a while. No change in shoe wear or trauma. She had similar episodes in the past but usually resolves by itself.   Patient Active Problem List   Diagnosis Date Noted   Moderate major depression (HCC) 05/01/2022   Intertrigo 05/01/2022   Insomnia due to mental condition 05/01/2022   Mild intermittent asthma without complication 10/20/2020   Primary osteoarthritis of both knees 10/20/2020   Primary osteoarthritis of right knee 04/06/2020   Primary osteoarthritis of left knee 04/06/2020   Pre-diabetes 12/05/2018   Malabsorption of iron 11/07/2018   Chronic pain of right knee 07/26/2017   Vitamin D deficiency 04/24/2017   B12 deficiency 04/24/2017   Abnormal mammogram of right breast 01/01/2017   Asthma, mild persistent 12/17/2016   History of shoulder surgery 10/12/2016   Primary osteoarthritis of left shoulder 06/14/2016   Incomplete tear of left rotator cuff 05/30/2016   Rotator cuff tendinitis 05/30/2016   History of bariatric surgery 05/04/2016   Allergic rhinitis 05/12/2015   Benign essential HTN 05/12/2015   Dyslipidemia 05/12/2015   Bilateral lower extremity edema 05/12/2015   Gastroesophageal reflux disease without esophagitis 05/12/2015   Migraine 05/12/2015   Plantar fasciitis 05/12/2015   Umbilical hernia without obstruction and without gangrene 05/12/2015   Morbid obesity with BMI of 40.0-44.9, adult (HCC) 02/13/2008    Past Surgical History:  Procedure Laterality Date   ABDOMINAL HYSTERECTOMY  2012   CHOLECYSTECTOMY     COLONOSCOPY WITH PROPOFOL N/A 12/18/2019   Procedure: COLONOSCOPY WITH PROPOFOL;  Surgeon: Toney Reil, MD;  Location:  [facility];  Service: Gastroenterology;  Laterality: N/A;   DILATION AND CURETTAGE OF UTERUS     ESOPHAGOGASTRODUODENOSCOPY (EGD) WITH PROPOFOL N/A 03/18/2020   Procedure: ESOPHAGOGASTRODUODENOSCOPY (EGD) WITH PROPOFOL;  Surgeon: Toney Reil, MD;  Location[facility];  Service: Gastroenterology;  Laterality: N/A;   GASTRIC BYPASS  2005   INDUCED ABORTION N/A 1997   patient was about 22 weeks   POLYPECTOMY  12/18/2019   Procedure: POLYPECTOMY;  Surgeon: Toney Reil, MD;  Location: [facility];  Service: Gastroenterology;;   SHOULDER ARTHROSCOPY WITH OPEN ROTATOR CUFF REPAIR Left 06/14/2016   Procedure: SHOULDER ARTHROSCOPY WITH OPEN ROTATOR CUFF REPAIR, DECOMPRESSION, EXCISION LOOSE BODY, DEBRIDEMENT;  Surgeon: Christena Flake, MD;  Location: [facility];  Service: Orthopedics;  Laterality: Left;    Family History  Problem Relation Age of Onset   Hypertension Mother    Cancer Father        prostate   Heart disease Brother    Breast cancer Cousin 40       mat cousin    Social History   Tobacco Use   Smoking status: Never   Smokeless tobacco: Never  Substance Use Topics   Alcohol use: No    Alcohol/week: 0.0 standard drinks of alcohol     Current Outpatient Medications:    acetaminophen (TYLENOL) 325 MG tablet, Take 325 mg by mouth every 6 (six) hours as needed. PRN, Disp: , Rfl:    albuterol (VENTOLIN HFA) 108 (90 Base) MCG/ACT inhaler, Inhale 2 puffs into the lungs every 6 (  six) hours as needed for wheezing or shortness of breath., Disp: 18 g, Rfl: 0   amLODipine (NORVASC) 2.5 MG tablet, Take 1 tablet (2.5 mg total) by mouth daily., Disp: 90 tablet, Rfl: 1   aspirin EC 81 MG tablet, Take 1 tablet (81 mg total) by mouth daily. Swallow whole., Disp: , Rfl:    buPROPion (WELLBUTRIN XL) 300 MG 24 hr tablet, Take 1 tablet (300 mg total) by mouth daily., Disp: 90 tablet, Rfl: 1   fluticasone (FLONASE) 50 MCG/ACT nasal spray, SHAKE LIQUID AND USE 2 SPRAYS  IN EACH NOSTRIL DAILY, Disp: 48 g, Rfl: 0   furosemide (LASIX) 20 MG tablet, Take 1 tablet (20 mg total) by mouth daily as needed. For leg swelling, Disp: 30 tablet, Rfl: 5   Insulin Pen Needle (NOVOFINE PEN NEEDLE) 32G X 6 MM MISC, 1 each by Does not apply route daily at 12 noon., Disp: 100 each, Rfl: 1   Liraglutide -Weight Management (SAXENDA) 18 MG/3ML SOPN, Inject 0.6 mg into the skin daily., Disp: 15 mL, Rfl: 0   loratadine (CLARITIN) 10 MG tablet, Take 1 tablet (10 mg total) by mouth daily., Disp: 90 tablet, Rfl: 1   montelukast (SINGULAIR) 10 MG tablet, Take 1 tablet (10 mg total) by mouth at bedtime., Disp: 90 tablet, Rfl: 1   nystatin cream (MYCOSTATIN), Apply 1 Application topically 2 (two) times daily., Disp: 30 g, Rfl: 5   omeprazole (PRILOSEC) 40 MG capsule, Take 1 capsule (40 mg total) by mouth daily., Disp: 90 capsule, Rfl: 1   QUEtiapine (SEROQUEL) 25 MG tablet, Take 1 tablet (25 mg total) by mouth at bedtime., Disp: 90 tablet, Rfl: 0   rosuvastatin (CRESTOR) 40 MG tablet, Take 1 tablet (40 mg total) by mouth daily., Disp: 90 tablet, Rfl: 1   triamterene-hydrochlorothiazide (MAXZIDE-25) 37.5-25 MG tablet, Take 1 tablet by mouth daily. In place of HCTZ 25 , BP medication, Disp: 90 tablet, Rfl: 1   Vitamin D, Ergocalciferol, (DRISDOL) 1.25 MG (50000 UNIT) CAPS capsule, Take 1 capsule (50,000 Units total) by mouth every 7 (seven) days., Disp: 12 capsule, Rfl: 1  Allergies  Allergen Reactions   Ace Inhibitors     angioedema   Codeine Swelling   Valsartan Swelling    I personally reviewed active problem list, medication list, allergies, family history, social history, health maintenance with the patient/caregiver today.   ROS  Ten systems reviewed and is negative except as mentioned in HPI   Objective  Vitals:   10/24/22 1521  BP: 134/86  Pulse: 82  Resp: 16  SpO2: 94%  Weight: 269 lb (122 kg)  Height: 5\' 7"  (1.702 m)    Body mass index is 42.13 kg/m.  Physical  Exam  Constitutional: Patient appears well-developed and well-nourished. Obese  No distress.  HEENT: head atraumatic, normocephalic, pupils equal and reactive to light, neck supple, throat within normal limits Cardiovascular: Normal rate, regular rhythm and normal heart sounds.  No murmur heard. No BLE edema. Pulmonary/Chest: Effort normal and breath sounds normal. No respiratory distress. Abdominal: Soft.  There is no tenderness. Muscular Skeletal: swelling on left heel near the arch, no redness , tender to pressure and weight bearing, normal ankle exam Psychiatric: Patient has a normal mood and affect. behavior is normal. Judgment and thought content normal.   Recent Results (from the past 2160 hour(s))  Vitamin B12     Status: None   Collection Time: 10/01/22 12:41 PM  Result Value Ref Range   Vitamin B-12 287  180 - 914 pg/mL    Comment: (NOTE) This assay is not validated for testing neonatal or myeloproliferative syndrome specimens for Vitamin B12 levels. Performed at [facility], [address] [place], [place] [address]   Iron and TIBC     Status: None   Collection Time: 10/01/22 12:41 PM  Result Value Ref Range   Iron 95 28 - 170 ug/dL   TIBC 119427 147250 - 829450 ug/dL   Saturation Ratios 22 10.4 - 31.8 %   UIBC 332 ug/dL    Comment: Performed at [facility], [address] [place], [place] [address]  Ferritin     Status: None   Collection Time: 10/01/22 12:41 PM  Result Value Ref Range   Ferritin 21 11 - 307 ng/mL    Comment: Performed at [facility], [address] [place], [place] [address]  CBC     Status: None   Collection Time: 10/01/22 12:41 PM  Result Value Ref Range   WBC 5.2 4.0 - 10.5 K/uL   RBC 4.33 3.87 - 5.11 MIL/uL   Hemoglobin 12.9 12.0 - 15.0 g/dL   HCT 84.638.1 96.236.0 - 95.246.0 %   MCV 88.0 80.0 - 100.0 fL   MCH 29.8 26.0 - 34.0 pg   MCHC 33.9 30.0 - 36.0 g/dL   RDW 84.113.1 32.411.5 - 40.115.5 %   Platelets 244 150 - 400 K/uL   nRBC 0.0 0.0  - 0.2 %    Comment: Performed at [facility], [address] [place], [place] [address]  Lipid panel     Status: Abnormal   Collection Time: 10/04/22  7:43 AM  Result Value Ref Range   Cholesterol 230 (H) 0 - 200 mg/dL   Triglycerides 366149 <440<150 mg/dL   HDL 65 >34>40 mg/dL   Total CHOL/HDL Ratio 3.5 RATIO   VLDL 30 0 - 40 mg/dL   LDL Cholesterol 742135 (H) 0 - 99 mg/dL    Comment:        Total Cholesterol/HDL:CHD Risk Coronary Heart Disease Risk Table                     Men   Women  1/2 Average Risk   3.4   3.3  Average Risk       5.0   4.4  2 X Average Risk   9.6   7.1  3 X Average Risk  23.4   11.0        Use the calculated Patient Ratio above and the CHD Risk Table to determine the patient's CHD Risk.        ATP III CLASSIFICATION (LDL):  <100     mg/dL   Optimal  [id number]  mg/dL   Near or Above                    Optimal  130-159  mg/dL   Borderline  [id number]  mg/dL   High  >295>190     mg/dL   Very High Performed at [facility], [address] [place], [place] [address]     PHQ2/9:    10/24/2022    3:21 PM 08/08/2022    1:49 PM 06/13/2022   10:10 AM 06/12/2022    3:44 PM 05/01/2022    3:28 PM  Depression screen PHQ 2/9  Decreased Interest 1 0 1 1 1   Down, Depressed, Hopeless 0 0 0 0 1  PHQ - 2 Score 1 0  1 1 2   Altered sleeping 1 3 3 3 3   Tired, decreased energy 1 1 1 1 3   Change in appetite 1 0 0 0 0  Feeling bad or failure about yourself  0 0 0 0 3  Trouble concentrating 1 3 1 1 3   Moving slowly or fidgety/restless 0 0 0 0 0  Suicidal thoughts 0 0 0 0 0  PHQ-9 Score 5 7 6 6 14   Difficult doing work/chores   Not difficult at all      phq 9 is positive   Fall Risk:    10/24/2022    3:20 PM 08/08/2022    1:39 PM 06/13/2022   10:10 AM 05/01/2022    3:28 PM 01/23/2022    2:22 PM  Fall Risk   Falls in the past year? 0 0 0 0 0  Number falls in past yr: 0 0  0 0  Injury with Fall? 0 0  0 0  Risk for fall due to : No Fall Risks No Fall Risks No Fall  Risks No Fall Risks No Fall Risks  Follow up Falls prevention discussed Falls prevention discussed Falls prevention discussed Falls prevention discussed Falls prevention discussed      Functional Status Survey: Is the patient deaf or have difficulty hearing?: No Does the patient have difficulty seeing, even when wearing glasses/contacts?: Yes Does the patient have difficulty concentrating, remembering, or making decisions?: Yes Does the patient have difficulty walking or climbing stairs?: Yes Does the patient have difficulty dressing or bathing?: No Does the patient have difficulty doing errands alone such as visiting a doctor's office or shopping?: No    Assessment & Plan  1. Plantar fasciitis of left foot  - diclofenac (VOLTAREN) 75 MG EC tablet; Take 1 tablet (75 mg total) by mouth 2 (two) times daily.  Dispense: 30 tablet; Refill: 0   Stretching exercises

## 2022-10-24 ENCOUNTER — Ambulatory Visit (INDEPENDENT_AMBULATORY_CARE_PROVIDER_SITE_OTHER): Payer: BC Managed Care – PPO | Admitting: Family Medicine

## 2022-10-24 ENCOUNTER — Encounter: Payer: Self-pay | Admitting: Family Medicine

## 2022-10-24 VITALS — BP 134/86 | HR 82 | Resp 16 | Ht 67.0 in | Wt 269.0 lb

## 2022-10-24 DIAGNOSIS — M722 Plantar fascial fibromatosis: Secondary | ICD-10-CM

## 2022-10-24 MED ORDER — DICLOFENAC SODIUM 75 MG PO TBEC: 75.0000 mg | DELAYED_RELEASE_TABLET | Freq: Two times a day (BID) | ORAL | 0 refills | Status: DC

## 2022-10-24 MED FILL — Iron Sucrose Inj 20 MG/ML (Fe Equiv): INTRAVENOUS | Qty: 10 | Status: AC

## 2022-10-24 NOTE — Patient Instructions (Signed)
Plantar Fasciitis Rehab Ask your health care provider which exercises are safe for you. Do exercises exactly as told by your health care provider and adjust them as directed. It is normal to feel mild stretching, pulling, tightness, or discomfort as you do these exercises. Stop right away if you feel sudden pain or your pain gets worse. Do not begin these exercises until told by your health care provider. Stretching and range-of-motion exercises These exercises warm up your muscles and joints and improve the movement and flexibility of your foot. These exercises also help to relieve pain. Plantar fascia stretch  Sit with your left / right leg crossed over your opposite knee. Hold your heel with one hand with that thumb near your arch. With your other hand, hold your toes and gently pull them back toward the top of your foot. You should feel a stretch on the base (bottom) of your toes, or the bottom of your foot (plantar fascia), or both. Hold this stretch for__________ seconds. Slowly release your toes and return to the starting position. Repeat __________ times. Complete this exercise __________ times a day. Gastrocnemius stretch, standing This exercise is also called a calf (gastroc) stretch. It stretches the muscles in the back of the upper calf. Stand with your hands against a wall. Extend your left / right leg behind you, and bend your front knee slightly. Keeping your heels on the floor, your toes facing forward, and your back knee straight, shift your weight toward the wall. Do not arch your back. You should feel a gentle stretch in your upper calf. Hold this position for __________ seconds. Repeat __________ times. Complete this exercise __________ times a day. Soleus stretch, standing This exercise is also called a calf (soleus) stretch. It stretches the muscles in the back of the lower calf. Stand with your hands against a wall. Extend your left / right leg behind you, and bend your  front knee slightly. Keeping your heels on the floor and your toes facing forward, bend your back knee and shift your weight slightly over your back leg. You should feel a gentle stretch deep in your lower calf. Hold this position for __________ seconds. Repeat __________ times. Complete this exercise __________ times a day. Gastroc and soleus stretch, standing step This exercise stretches the muscles in the back of the lower leg. These muscles are in the upper calf (gastrocnemius) and the lower calf (soleus). Stand with the ball of your left / right foot on the front of a step. The ball of your foot is on the walking surface, right under your toes. Keep your other foot firmly on the same step. Hold on to the wall or a railing for balance. Slowly lift your other foot, allowing your body weight to press your heel down over the edge of the front of the step. Keep knee straight and unbent. You should feel a stretch in your calf. Hold this position for __________ seconds. Return both feet to the step. Repeat this exercise with a slight bend in your left / right knee. Repeat __________ times with your left / right knee straight and __________ times with your left / right knee bent. Complete this exercise __________ times a day. Balance exercise This exercise builds your balance and strength control of your arch to help take pressure off your plantar fascia. Single leg stand If this exercise is too easy, you can try it with your eyes closed or while standing on a pillow. Without shoes, stand near a   railing or in a doorway. You may hold on to the railing or door frame as needed. Stand on your left / right foot. Keep your big toe down on the floor and lift the arch of your foot. You should feel a stretch across the bottom of your foot and your arch. Do not let your foot roll inward. Hold this position for __________ seconds. Repeat __________ times. Complete this exercise __________ times a day. This  information is not intended to replace advice given to you by your health care provider. Make sure you discuss any questions you have with your health care provider. Document Revised: 08/04/2020 Document Reviewed: 08/04/2020 Elsevier Patient Education  2023 [employer] Plantar Fasciitis  Plantar fasciitis is a painful foot condition that affects the heel. It occurs when the band of tissue that connects the toes to the heel bone (plantar fascia) becomes irritated. This can happen as the result of exercising too much or doing other repetitive activities (overuse injury). Plantar fasciitis can cause mild irritation to severe pain that makes it difficult to walk or move. The pain is usually worse in the morning after sleeping, or after sitting or lying down for a period of time. Pain may also be worse after long periods of walking or standing. What are the causes? This condition may be caused by: Standing for long periods of time. Wearing shoes that do not have good arch support. Doing activities that put stress on joints (high-impact activities). This includes ballet and exercise that makes your heart beat faster (aerobic exercise), such as running. Being overweight. An abnormal way of walking (gait). Tight muscles in the back of your lower leg (calf). High arches in your feet or flat feet. Starting a new athletic activity. What are the signs or symptoms? The main symptom of this condition is heel pain. Pain may get worse after the following: Taking the first steps after a time of rest, especially in the morning after awakening, or after you have been sitting or lying down for a while. Long periods of standing still. Pain may decrease after 30-45 minutes of activity, such as gentle walking. How is this diagnosed? This condition may be diagnosed based on your medical history, a physical exam, and your symptoms. Your health care provider will check for: A tender area on the bottom of your  foot. A high arch in your foot or flat feet. Pain when you move your foot. Difficulty moving your foot. You may have imaging tests to confirm the diagnosis, such as: X-rays. Ultrasound. MRI. How is this treated? Treatment for plantar fasciitis depends on how severe your condition is. Treatment may include: Rest, ice, pressure (compression), and raising (elevating) the affected foot. This is called RICE therapy. Your health care provider may recommend RICE therapy along with over-the-counter pain medicines to manage your pain. Exercises to stretch your calves and your plantar fascia. A splint that holds your foot in a stretched, upward position while you sleep (night splint). Physical therapy to relieve symptoms and prevent problems in the future. Injections of steroid medicine (cortisone) to relieve pain and inflammation. Stimulating your plantar fascia with electrical impulses (extracorporeal shock wave therapy). This is usually the last treatment option before surgery. Surgery, if other treatments have not worked after 12 months. Follow these instructions at home: Managing pain, stiffness, and swelling  If directed, put ice on the painful area. To do this: Put ice in a plastic bag, or use a frozen bottle of water. Place a towel   between your skin and the bag or bottle. Roll the bottom of your foot over the bag or bottle. Do this for 20 minutes, 2-3 times a day. Wear athletic shoes that have air-sole or gel-sole cushions, or try soft shoe inserts that are designed for plantar fasciitis. Elevate your foot above the level of your heart while you are sitting or lying down. Activity Avoid activities that cause pain. Ask your health care provider what activities are safe for you. Do physical therapy exercises and stretches as told by your health care provider. Try activities and forms of exercise that are easier on your joints (low impact). Examples include swimming, water aerobics, and  biking. General instructions Take over-the-counter and prescription medicines only as told by your health care provider. Wear a night splint while sleeping, if told by your health care provider. Loosen the splint if your toes tingle, become numb, or turn cold and blue. Maintain a healthy weight, or work with your health care provider to lose weight as needed. Keep all follow-up visits. This is important. Contact a health care provider if you have: Symptoms that do not go away with home treatment. Pain that gets worse. Pain that affects your ability to move or do daily activities. Summary Plantar fasciitis is a painful foot condition that affects the heel. It occurs when the band of tissue that connects the toes to the heel bone (plantar fascia) becomes irritated. Heel pain is the main symptom of this condition. It may get worse after exercising too much or standing still for a long time. Treatment varies, but it usually starts with rest, ice, pressure (compression), and raising (elevating) the affected foot. This is called RICE therapy. Over-the-counter medicines can also be used to manage pain. This information is not intended to replace advice given to you by your health care provider. Make sure you discuss any questions you have with your health care provider. Document Revised: 02/08/2020 Document Reviewed: 02/08/2020 Elsevier Patient Education  2023 [employer]  

## 2022-10-25 ENCOUNTER — Inpatient Hospital Stay: Payer: BC Managed Care – PPO

## 2022-10-30 ENCOUNTER — Inpatient Hospital Stay: Payer: BC Managed Care – PPO

## 2022-10-31 ENCOUNTER — Ambulatory Visit: Payer: BC Managed Care – PPO

## 2022-11-01 ENCOUNTER — Inpatient Hospital Stay: Payer: BC Managed Care – PPO

## 2022-11-01 VITALS — BP 143/74 | HR 53 | Temp 96.7°F | Resp 18

## 2022-11-01 DIAGNOSIS — E538 Deficiency of other specified B group vitamins: Secondary | ICD-10-CM

## 2022-11-01 DIAGNOSIS — D508 Other iron deficiency anemias: Secondary | ICD-10-CM | POA: Diagnosis not present

## 2022-11-01 MED ORDER — SODIUM CHLORIDE 0.9 % IV SOLN
200.0000 mg | INTRAVENOUS | Status: DC
  Administered 2022-11-01: 200 mg via INTRAVENOUS
  Filled 2022-11-01: qty 200

## 2022-11-01 MED ORDER — SODIUM CHLORIDE 0.9 % IV SOLN
Freq: Once | INTRAVENOUS | Status: AC
  Filled 2022-11-01: qty 250

## 2022-11-07 ENCOUNTER — Encounter: Payer: Self-pay | Admitting: Family Medicine

## 2022-11-08 ENCOUNTER — Inpatient Hospital Stay: Payer: BC Managed Care – PPO | Attending: Oncology

## 2022-11-08 VITALS — BP 139/72 | HR 70 | Temp 97.0°F | Resp 18

## 2022-11-08 DIAGNOSIS — E538 Deficiency of other specified B group vitamins: Secondary | ICD-10-CM

## 2022-11-08 DIAGNOSIS — D508 Other iron deficiency anemias: Secondary | ICD-10-CM | POA: Diagnosis not present

## 2022-11-08 MED ORDER — SODIUM CHLORIDE 0.9 % IV SOLN
Freq: Once | INTRAVENOUS | Status: AC
  Filled 2022-11-08: qty 250

## 2022-11-08 MED ORDER — SODIUM CHLORIDE 0.9 % IV SOLN
200.0000 mg | INTRAVENOUS | Status: DC
  Administered 2022-11-08: 200 mg via INTRAVENOUS
  Filled 2022-11-08: qty 200

## 2022-11-15 ENCOUNTER — Inpatient Hospital Stay: Payer: BC Managed Care – PPO

## 2022-11-15 VITALS — BP 138/87 | HR 53 | Temp 96.6°F | Resp 17

## 2022-11-15 DIAGNOSIS — E538 Deficiency of other specified B group vitamins: Secondary | ICD-10-CM

## 2022-11-15 DIAGNOSIS — D508 Other iron deficiency anemias: Secondary | ICD-10-CM | POA: Diagnosis not present

## 2022-11-15 MED ORDER — SODIUM CHLORIDE 0.9 % IV SOLN
Freq: Once | INTRAVENOUS | Status: AC
  Filled 2022-11-15: qty 250

## 2022-11-15 MED ORDER — SODIUM CHLORIDE 0.9 % IV SOLN
200.0000 mg | INTRAVENOUS | Status: DC
  Administered 2022-11-15: 200 mg via INTRAVENOUS
  Filled 2022-11-15: qty 200

## 2022-11-15 MED ORDER — CYANOCOBALAMIN 1000 MCG/ML IJ SOLN
1000.0000 ug | Freq: Once | INTRAMUSCULAR | Status: AC
  Administered 2022-11-15: 1000 ug via INTRAMUSCULAR
  Filled 2022-11-15: qty 1

## 2022-11-15 NOTE — Patient Instructions (Signed)
Iron Sucrose Injection What is this medication? IRON SUCROSE (EYE ern SOO krose) treats low levels of iron (iron deficiency anemia) in people with kidney disease. Iron is a mineral that plays an important role in making red blood cells, which carry oxygen from your lungs to the rest of your body. This medicine may be used for other purposes; ask your health care provider or pharmacist if you have questions. COMMON BRAND NAME(S): Venofer What should I tell my care team before I take this medication? They need to know if you have any of these conditions: Anemia not caused by low iron levels Heart disease High levels of iron in the blood Kidney disease Liver disease An unusual or allergic reaction to iron, other medications, foods, dyes, or preservatives Pregnant or trying to get pregnant Breastfeeding How should I use this medication? This medication is for infusion into a vein. It is given in a hospital or clinic setting. Talk to your care team about the use of this medication in children. While this medication may be prescribed for children as young as 2 years for selected conditions, precautions do apply. Overdosage: If you think you have taken too much of this medicine contact a poison control center or emergency room at once. NOTE: This medicine is only for you. Do not share this medicine with others. What if I miss a dose? Keep appointments for follow-up doses. It is important not to miss your dose. Call your care team if you are unable to keep an appointment. What may interact with this medication? Do not take this medication with any of the following: Deferoxamine Dimercaprol Other iron products This medication may also interact with the following: Chloramphenicol Deferasirox This list may not describe all possible interactions. Give your health care provider a list of all the medicines, herbs, non-prescription drugs, or dietary supplements you use. Also tell them if you smoke,  drink alcohol, or use illegal drugs. Some items may interact with your medicine. What should I watch for while using this medication? Visit your care team regularly. Tell your care team if your symptoms do not start to get better or if they get worse. You may need blood work done while you are taking this medication. You may need to follow a special diet. Talk to your care team. Foods that contain iron include: whole grains/cereals, dried fruits, beans, or peas, leafy green vegetables, and organ meats (liver, kidney). What side effects may I notice from receiving this medication? Side effects that you should report to your care team as soon as possible: Allergic reactions--skin rash, itching, hives, swelling of the face, lips, tongue, or throat Low blood pressure--dizziness, feeling faint or lightheaded, blurry vision Shortness of breath Side effects that usually do not require medical attention (report to your care team if they continue or are bothersome): Flushing Headache Joint pain Muscle pain Nausea Pain, redness, or irritation at injection site This list may not describe all possible side effects. Call your doctor for medical advice about side effects. You may report side effects to FDA at 1-800-FDA-1088. Where should I keep my medication? This medication is given in a hospital or clinic and will not be stored at home. NOTE: This sheet is a summary. It may not cover all possible information. If you have questions about this medicine, talk to your doctor, pharmacist, or health care provider.  2023 Elsevier/Gold Standard (2021-02-02 00:00:00)  Vitamin B12 Injection What is this medication? Vitamin B12 (VAHY tuh min B12) prevents and treats low   vitamin B12 levels in your body. It is used in people who do not get enough vitamin B12 from their diet or when their digestive tract does not absorb enough. Vitamin B12 plays an important role in maintaining the health of your nervous system and  red blood cells. This medicine may be used for other purposes; ask your health care provider or pharmacist if you have questions. COMMON BRAND NAME(S): B-12 Compliance Kit, B-12 Injection Kit, Cyomin, Dodex, LA-12, Nutri-Twelve, Physicians EZ Use B-12, Primabalt What should I tell my care team before I take this medication? They need to know if you have any of these conditions: Kidney disease Leber's disease Megaloblastic anemia An unusual or allergic reaction to cyanocobalamin, cobalt, other medications, foods, dyes, or preservatives Pregnant or trying to get pregnant Breast-feeding How should I use this medication? This medication is injected into a muscle or deeply under the skin. It is usually given in a clinic or care team's office. However, your care team may teach you how to inject yourself. Follow all instructions. Talk to your care team about the use of this medication in children. Special care may be needed. Overdosage: If you think you have taken too much of this medicine contact a poison control center or emergency room at once. NOTE: This medicine is only for you. Do not share this medicine with others. What if I miss a dose? If you are given your dose at a clinic or care team's office, call to reschedule your appointment. If you give your own injections, and you miss a dose, take it as soon as you can. If it is almost time for your next dose, take only that dose. Do not take double or extra doses. What may interact with this medication? Alcohol Colchicine This list may not describe all possible interactions. Give your health care provider a list of all the medicines, herbs, non-prescription drugs, or dietary supplements you use. Also tell them if you smoke, drink alcohol, or use illegal drugs. Some items may interact with your medicine. What should I watch for while using this medication? Visit your care team regularly. You may need blood work done while you are taking this  medication. You may need to follow a special diet. Talk to your care team. Limit your alcohol intake and avoid smoking to get the best benefit. What side effects may I notice from receiving this medication? Side effects that you should report to your care team as soon as possible: Allergic reactions--skin rash, itching, hives, swelling of the face, lips, tongue, or throat Swelling of the ankles, hands, or feet Trouble breathing Side effects that usually do not require medical attention (report to your care team if they continue or are bothersome): Diarrhea This list may not describe all possible side effects. Call your doctor for medical advice about side effects. You may report side effects to FDA at 1-800-FDA-1088. Where should I keep my medication? Keep out of the reach of children. Store at room temperature between 15 and 30 degrees C (59 and 85 degrees F). Protect from light. Throw away any unused medication after the expiration date. NOTE: This sheet is a summary. It may not cover all possible information. If you have questions about this medicine, talk to your doctor, pharmacist, or health care provider.  2023 Elsevier/Gold Standard (2007-12-13 00:00:00)   

## 2022-11-22 ENCOUNTER — Inpatient Hospital Stay: Payer: BC Managed Care – PPO

## 2022-11-22 VITALS — BP 136/77 | HR 70 | Temp 98.5°F | Resp 18

## 2022-11-22 DIAGNOSIS — D508 Other iron deficiency anemias: Secondary | ICD-10-CM | POA: Diagnosis not present

## 2022-11-22 DIAGNOSIS — E538 Deficiency of other specified B group vitamins: Secondary | ICD-10-CM | POA: Diagnosis not present

## 2022-11-22 MED ORDER — SODIUM CHLORIDE 0.9 % IV SOLN
Freq: Once | INTRAVENOUS | Status: AC
  Filled 2022-11-22: qty 250

## 2022-11-22 MED ORDER — SODIUM CHLORIDE 0.9 % IV SOLN
200.0000 mg | INTRAVENOUS | Status: DC
  Administered 2022-11-22: 200 mg via INTRAVENOUS
  Filled 2022-11-22: qty 200

## 2022-11-22 NOTE — Progress Notes (Signed)
Pt has been educated and understands. Pt refused to stay 30 mins after iron infusion. VSS. 

## 2022-11-30 ENCOUNTER — Ambulatory Visit: Payer: BC Managed Care – PPO

## 2022-11-30 ENCOUNTER — Ambulatory Visit: Payer: BC Managed Care – PPO | Admitting: Oncology

## 2022-11-30 ENCOUNTER — Other Ambulatory Visit: Payer: BC Managed Care – PPO

## 2022-12-10 NOTE — Progress Notes (Unsigned)
Name: Savannah Manning   MRN: [id number]    DOB: 11/16/1960   Date:12/11/2022       Progress Note  Subjective  Chief Complaint  Follow Up  HPI  MDD: Father has dementia and is stressful taking care of him. Her son is going to school in [place] and she worries about him She is also on productivity at work and is very stressful for her . She has been depression for a long time. We gave her Lexapro rx Spring 2023 but she states she didn't like how it made her feel and stopped taking it . She could not tolerate duloxetine - it caused nausea She is taking Seroquel prn at night to sleep and we added wellbutrin 06/2022, phq 9 is worse, she states has difficulty focusing at work, also upset about inability to take [place] for weight loss due to [employer].    Obesity/prediabetes: .  She has been obese since childhood. She has tried multiple diets in the past, she had gastric bypass in 2005. Weight before surgery was 314 lbs, she went down to 164 lbs, but has been gradually gaining weight. She states her weight has gone up since she started to work from home in  2010.  She had her surgery done at [facility], and was denied for a revision in the Summer 2018.  She has failed Metformin, started her on Ozempic  04/2017 her weight was 259 lbs, her weight went as low as 239 lbs. Her insurance has denied Ozempic, only paying for patients that have DM. She states is not covering Saxenda again, we gave rx of Contrave Dec 2021 but she stopped due to cost. She gained more weight and we resumed Saxenda March 2023 her weight at the start of therapy was 278 lbs and it went  down to 264 lbs August 2023 - we added Contrave in 2023 however she we switched to wellbutrin XL she is now out of saxenda due to [employer], we will try sending Zepbound   Asthma mild intermittent: she is doing well, nocturnal cough improved, no longer weekly, , taking singulair    HTN:  She denies chest pain or palpitation. She is on Maxzide and  norvasc , bp is at goal today continue current regiment. Explained Norvasc may cause edema, however cannot take ARB/ACE or a beta blocker due to bradycardia and allergies. She was seen by cardiologist and furosemide was added for ankle edema, she takes it prn only   Dyslipidemia: LDL was 135, she is taking rosuvastatin , she is on 40 mg dose - keep follow up with Dr. Mylo Red   Vitamin D deficiency/ vitamin B12 deficiency : continue rx vitamin D, she is getting b12 shots at hematologist   Iron deficiency anemia: seen by hematologist and had some iron infusion, due to malabsorption secondary to bariatric surgery   Dumping syndrome: she states doing well most of the time - she has been doing well   GERD: she is taking Omeprazole given by Dr. Marius Ditch , symptoms controlled with one dose at night now   Left plantar fascitis: symptoms started en of 2023, she has tried otc insoles, nsaid's, braces, socks, topical medications without help. She is willing to see podiatrist now   Patient Active Problem List   Diagnosis Date Noted   Moderate major depression ([place]) 05/01/2022   Intertrigo 05/01/2022   Insomnia due to mental condition 05/01/2022   Mild intermittent asthma without complication 08/65/7846   Primary osteoarthritis of both  knees 10/20/2020   Primary osteoarthritis of right knee 04/06/2020   Primary osteoarthritis of left knee 04/06/2020   Pre-diabetes 12/05/2018   Malabsorption of iron 11/07/2018   Chronic pain of right knee 07/26/2017   Vitamin D deficiency 04/24/2017   B12 deficiency 04/24/2017   Abnormal mammogram of right breast 01/01/2017   Asthma, mild persistent 12/17/2016   History of shoulder surgery 10/12/2016   Primary osteoarthritis of left shoulder 06/14/2016   Incomplete tear of left rotator cuff 05/30/2016   Rotator cuff tendinitis 05/30/2016   History of bariatric surgery 05/04/2016   Allergic rhinitis 05/12/2015   Benign essential HTN 05/12/2015   Dyslipidemia  05/12/2015   Bilateral lower extremity edema 05/12/2015   Gastroesophageal reflux disease without esophagitis 05/12/2015   Migraine 05/12/2015   Plantar fasciitis 71/69/6789   Umbilical hernia without obstruction and without gangrene 05/12/2015   Morbid obesity with BMI of 40.0-44.9, adult ([place]) 02/13/2008    Past Surgical History:  Procedure Laterality Date   ABDOMINAL HYSTERECTOMY  2012   CHOLECYSTECTOMY     COLONOSCOPY WITH PROPOFOL N/A 12/18/2019   Procedure: COLONOSCOPY WITH PROPOFOL;  Surgeon: Lin Landsman, MD;  Location: [place];  Service: Gastroenterology;  Laterality: N/A;   DILATION AND CURETTAGE OF UTERUS     ESOPHAGOGASTRODUODENOSCOPY (EGD) WITH PROPOFOL N/A 03/18/2020   Procedure: ESOPHAGOGASTRODUODENOSCOPY (EGD) WITH PROPOFOL;  Surgeon: Lin Landsman, MD;  [facility] END[facility]COPY;  Service: Gastroenterology;  Laterality: N/A;   GASTRIC BYPASS  2005   INDUCED ABORTION N/A 1997   patient was about 22 weeks   POLYPECTOMY  12/18/2019   Procedure: POLYPECTOMY;  Surgeon: Lin Landsman, MD;  Location: [place];  Service: Gastroenterology;;   SHOULDER ARTHROSCOPY WITH OPEN ROTATOR CUFF REPAIR Left 06/14/2016   Procedure: SHOULDER ARTHROSCOPY WITH OPEN ROTATOR CUFF REPAIR, DECOMPRESSION, EXCISION LOOSE BODY, DEBRIDEMENT;  Surgeon: Corky Mull, MD;  Location: [facility];  Service: Orthopedics;  Laterality: Left;    Family History  Problem Relation Age of Onset   Hypertension Mother    Cancer Father        prostate   Heart disease Brother    Breast cancer Cousin 23       mat cousin    Social History   Tobacco Use   Smoking status: Never   Smokeless tobacco: Never  Substance Use Topics   Alcohol use: No    Alcohol/week: 0.0 standard drinks of alcohol     Current Outpatient Medications:    acetaminophen (TYLENOL) 325 MG tablet, Take 325 mg by mouth every 6 (six) hours as needed. PRN, Disp: , Rfl:    albuterol (VENTOLIN HFA) 108  (90 Base) MCG/ACT inhaler, Inhale 2 puffs into the lungs every 6 (six) hours as needed for wheezing or shortness of breath., Disp: 18 g, Rfl: 0   amLODipine (NORVASC) 2.5 MG tablet, Take 1 tablet (2.5 mg total) by mouth daily., Disp: 90 tablet, Rfl: 1   aspirin EC 81 MG tablet, Take 1 tablet (81 mg total) by mouth daily. Swallow whole., Disp: , Rfl:    buPROPion (WELLBUTRIN XL) 300 MG 24 hr tablet, Take 1 tablet (300 mg total) by mouth daily., Disp: 90 tablet, Rfl: 1   diclofenac (VOLTAREN) 75 MG EC tablet, Take 1 tablet (75 mg total) by mouth 2 (two) times daily., Disp: 30 tablet, Rfl: 0   fluticasone (FLONASE) 50 MCG/ACT nasal spray, SHAKE LIQUID AND USE 2 SPRAYS IN EACH NOSTRIL DAILY, Disp: 48 g, Rfl: 0  furosemide (LASIX) 20 MG tablet, Take 1 tablet (20 mg total) by mouth daily as needed. For leg swelling, Disp: 30 tablet, Rfl: 5   loratadine (CLARITIN) 10 MG tablet, Take 1 tablet (10 mg total) by mouth daily., Disp: 90 tablet, Rfl: 1   montelukast (SINGULAIR) 10 MG tablet, Take 1 tablet (10 mg total) by mouth at bedtime., Disp: 90 tablet, Rfl: 1   nystatin cream (MYCOSTATIN), Apply 1 Application topically 2 (two) times daily., Disp: 30 g, Rfl: 5   omeprazole (PRILOSEC) 40 MG capsule, Take 1 capsule (40 mg total) by mouth daily., Disp: 90 capsule, Rfl: 1   QUEtiapine (SEROQUEL) 25 MG tablet, Take 1 tablet (25 mg total) by mouth at bedtime., Disp: 90 tablet, Rfl: 0   rosuvastatin (CRESTOR) 40 MG tablet, Take 1 tablet (40 mg total) by mouth daily., Disp: 90 tablet, Rfl: 1   triamterene-hydrochlorothiazide (MAXZIDE-25) 37.5-25 MG tablet, Take 1 tablet by mouth daily. In place of HCTZ 25 , BP medication, Disp: 90 tablet, Rfl: 1   Vitamin D, Ergocalciferol, (DRISDOL) 1.25 MG (50000 UNIT) CAPS capsule, Take 1 capsule (50,000 Units total) by mouth every 7 (seven) days., Disp: 12 capsule, Rfl: 1  Allergies  Allergen Reactions   Ace Inhibitors     angioedema   Codeine Swelling   Valsartan Swelling     I personally reviewed active problem list, medication list, allergies, family history, social history, health maintenance with the patient/caregiver today.   ROS  Constitutional: Negative for fever or weight change.  Respiratory: Negative for cough and shortness of breath.   Cardiovascular: Negative for chest pain or palpitations.  Gastrointestinal: Negative for abdominal pain, no bowel changes.  Musculoskeletal: positive  for gait problem and intermittent  joint swelling.  Skin: Negative for rash.  Neurological: Negative for dizziness or headache.  No other specific complaints in a complete review of systems (except as listed in HPI above).   Objective  Vitals:   12/11/22 1407  BP: 130/82  Pulse: 86  Resp: 16  Temp: 97.7 F (36.5 C)  TempSrc: Oral  SpO2: 97%  Weight: 272 lb 3.2 oz (123.5 kg)  Height: 5\' 7"  (1.702 m)    Body mass index is 42.63 kg/m.  Physical Exam  Constitutional: Patient appears well-developed and well-nourished. Obese  No distress.  HEENT: head atraumatic, normocephalic, pupils equal and reactive to light, neck supple Cardiovascular: Normal rate, regular rhythm and normal heart sounds.  No murmur heard. No BLE edema. Pulmonary/Chest: Effort normal and breath sounds normal. No respiratory distress. Abdominal: Soft.  There is no tenderness. Psychiatric: Patient has a normal mood and affect. behavior is normal. Judgment and thought content normal.  Muscular skeletal: wearing knee braces, antalgic gait    PHQ2/9:    12/11/2022    2:09 PM 10/24/2022    3:21 PM 08/08/2022    1:49 PM 06/13/2022   10:10 AM 06/12/2022    3:44 PM  Depression screen PHQ 2/9  Decreased Interest 0 1 0 1 1  Down, Depressed, Hopeless 1 0 0 0 0  PHQ - 2 Score 1 1 0 1 1  Altered sleeping 3 1 3 3 3   Tired, decreased energy 1 1 1 1 1   Change in appetite 2 1 0 0 0  Feeling bad or failure about yourself  0 0 0 0 0  Trouble concentrating 3 1 3 1 1   Moving slowly or  fidgety/restless 0 0 0 0 0  Suicidal thoughts 0 0 0 0 0  PHQ-9 Score 10 5 7 6 6   Difficult doing work/chores Not difficult at all   Not difficult at all     phq 9 is positive   Fall Risk:    12/11/2022    2:09 PM 10/24/2022    3:20 PM 08/08/2022    1:39 PM 06/13/2022   10:10 AM 05/01/2022    3:28 PM  Fall Risk   Falls in the past year? 0 0 0 0 0  Number falls in past yr:  0 0  0  Injury with Fall?  0 0  0  Risk for fall due to : No Fall Risks No Fall Risks No Fall Risks No Fall Risks No Fall Risks  Follow up Falls prevention discussed;Education provided;Falls evaluation completed Falls prevention discussed Falls prevention discussed Falls prevention discussed Falls prevention discussed      Functional Status Survey: Is the patient deaf or have difficulty hearing?: No Does the patient have difficulty seeing, even when wearing glasses/contacts?: Yes Does the patient have difficulty concentrating, remembering, or making decisions?: Yes Does the patient have difficulty walking or climbing stairs?: Yes Does the patient have difficulty dressing or bathing?: No Does the patient have difficulty doing errands alone such as visiting a doctor's office or shopping?: No    Assessment & Plan  1. Moderate major depression ([place])  She is taking Wellbutrin, has problems with focus   2. Morbid obesity ([place])  - tirzepatide (ZEPBOUND) 2.5 MG/0.5ML Pen; Inject 2.5 mg into the skin once a week.  Dispense: 2 mL; Refill: 0  3. Plantar fasciitis of left foot  - diclofenac (VOLTAREN) 75 MG EC tablet; Take 1 tablet (75 mg total) by mouth 2 (two) times daily.  Dispense: 30 tablet; Refill: 0 Referral podiatrist   4. Benign essential HTN  - amLODipine (NORVASC) 2.5 MG tablet; Take 1 tablet (2.5 mg total) by mouth daily.  Dispense: 90 tablet; Refill: 1 - triamterene-hydrochlorothiazide (MAXZIDE-25) 37.5-25 MG tablet; Take 1 tablet by mouth daily. In place of HCTZ 25 , BP medication  Dispense: 90  tablet; Refill: 1  5. Insomnia due to mental condition  - QUEtiapine (SEROQUEL) 25 MG tablet; Take 1 tablet (25 mg total) by mouth at bedtime.  Dispense: 90 tablet; Refill: 1  6. Mild intermittent asthma without complication  - montelukast (SINGULAIR) 10 MG tablet; Take 1 tablet (10 mg total) by mouth at bedtime.  Dispense: 90 tablet; Refill: 1  7. Vitamin D deficiency  - Vitamin D, Ergocalciferol, (DRISDOL) 1.25 MG (50000 UNIT) CAPS capsule; Take 1 capsule (50,000 Units total) by mouth every 7 (seven) days.  Dispense: 12 capsule; Refill: 1  8. Gastroesophageal reflux disease without esophagitis  - omeprazole (PRILOSEC) 40 MG capsule; Take 1 capsule (40 mg total) by mouth daily.  Dispense: 90 capsule; Refill: 1  9. History of bariatric surgery   10. Dyslipidemia  - rosuvastatin (CRESTOR) 40 MG tablet; Take 1 tablet (40 mg total) by mouth daily.  Dispense: 90 tablet; Refill: 1  11. B12 deficiency   Continue supplementation

## 2022-12-11 ENCOUNTER — Encounter: Payer: Self-pay | Admitting: Family Medicine

## 2022-12-11 ENCOUNTER — Ambulatory Visit (INDEPENDENT_AMBULATORY_CARE_PROVIDER_SITE_OTHER): Payer: BC Managed Care – PPO | Admitting: Family Medicine

## 2022-12-11 DIAGNOSIS — Z9884 Bariatric surgery status: Secondary | ICD-10-CM

## 2022-12-11 DIAGNOSIS — M722 Plantar fascial fibromatosis: Secondary | ICD-10-CM

## 2022-12-11 DIAGNOSIS — K219 Gastro-esophageal reflux disease without esophagitis: Secondary | ICD-10-CM

## 2022-12-11 DIAGNOSIS — I10 Essential (primary) hypertension: Secondary | ICD-10-CM | POA: Diagnosis not present

## 2022-12-11 DIAGNOSIS — F321 Major depressive disorder, single episode, moderate: Secondary | ICD-10-CM | POA: Diagnosis not present

## 2022-12-11 DIAGNOSIS — E559 Vitamin D deficiency, unspecified: Secondary | ICD-10-CM

## 2022-12-11 DIAGNOSIS — F5105 Insomnia due to other mental disorder: Secondary | ICD-10-CM

## 2022-12-11 DIAGNOSIS — E785 Hyperlipidemia, unspecified: Secondary | ICD-10-CM

## 2022-12-11 DIAGNOSIS — J4520 Mild intermittent asthma, uncomplicated: Secondary | ICD-10-CM

## 2022-12-11 DIAGNOSIS — E538 Deficiency of other specified B group vitamins: Secondary | ICD-10-CM

## 2022-12-11 MED ORDER — MONTELUKAST SODIUM 10 MG PO TABS: 10.0000 mg | ORAL_TABLET | Freq: Every day | ORAL | 1 refills | Status: DC

## 2022-12-11 MED ORDER — VITAMIN D (ERGOCALCIFEROL) 1.25 MG (50000 UNIT) PO CAPS: 50000.0000 [IU] | ORAL_CAPSULE | ORAL | 1 refills | Status: DC

## 2022-12-11 MED ORDER — ZEPBOUND 2.5 MG/0.5ML ~~LOC~~ SOAJ: 2.5000 mg | SUBCUTANEOUS | 0 refills | Status: DC

## 2022-12-11 MED ORDER — ROSUVASTATIN CALCIUM 40 MG PO TABS: 40.0000 mg | ORAL_TABLET | Freq: Every day | ORAL | 1 refills | Status: DC

## 2022-12-11 MED ORDER — QUETIAPINE FUMARATE 25 MG PO TABS: 25.0000 mg | ORAL_TABLET | Freq: Every day | ORAL | 1 refills | Status: DC

## 2022-12-11 MED ORDER — OMEPRAZOLE 40 MG PO CPDR: 40.0000 mg | DELAYED_RELEASE_CAPSULE | Freq: Every day | ORAL | 1 refills | Status: DC

## 2022-12-11 MED ORDER — TRIAMTERENE-HCTZ 37.5-25 MG PO TABS: 1.0000 | ORAL_TABLET | Freq: Every day | ORAL | 1 refills | Status: DC

## 2022-12-11 MED ORDER — DICLOFENAC SODIUM 75 MG PO TBEC: 75.0000 mg | DELAYED_RELEASE_TABLET | Freq: Two times a day (BID) | ORAL | 0 refills | Status: DC

## 2022-12-11 MED ORDER — AMLODIPINE BESYLATE 2.5 MG PO TABS: 2.5000 mg | ORAL_TABLET | Freq: Every day | ORAL | 1 refills | Status: DC

## 2022-12-11 MED ORDER — BUPROPION HCL ER (XL) 300 MG PO TB24: 300.0000 mg | ORAL_TABLET | Freq: Every day | ORAL | 1 refills | Status: DC

## 2022-12-24 ENCOUNTER — Inpatient Hospital Stay: Payer: BC Managed Care – PPO

## 2022-12-26 ENCOUNTER — Inpatient Hospital Stay: Payer: BC Managed Care – PPO | Attending: Oncology

## 2022-12-26 DIAGNOSIS — E538 Deficiency of other specified B group vitamins: Secondary | ICD-10-CM | POA: Diagnosis not present

## 2022-12-26 DIAGNOSIS — D508 Other iron deficiency anemias: Secondary | ICD-10-CM | POA: Diagnosis not present

## 2022-12-26 MED ORDER — CYANOCOBALAMIN 1000 MCG/ML IJ SOLN
1000.0000 ug | Freq: Once | INTRAMUSCULAR | Status: AC
  Administered 2022-12-26: 1000 ug via INTRAMUSCULAR
  Filled 2022-12-26: qty 1

## 2022-12-31 ENCOUNTER — Ambulatory Visit: Payer: BC Managed Care – PPO

## 2023-01-02 ENCOUNTER — Ambulatory Visit (INDEPENDENT_AMBULATORY_CARE_PROVIDER_SITE_OTHER): Payer: BC Managed Care – PPO

## 2023-01-02 ENCOUNTER — Encounter: Payer: Self-pay | Admitting: Podiatry

## 2023-01-02 ENCOUNTER — Ambulatory Visit (INDEPENDENT_AMBULATORY_CARE_PROVIDER_SITE_OTHER): Payer: BC Managed Care – PPO | Admitting: Podiatry

## 2023-01-02 DIAGNOSIS — M722 Plantar fascial fibromatosis: Secondary | ICD-10-CM | POA: Diagnosis not present

## 2023-01-02 MED ORDER — MELOXICAM 15 MG PO TABS: 15.0000 mg | ORAL_TABLET | Freq: Every day | ORAL | 3 refills | Status: DC

## 2023-01-02 MED ORDER — METHYLPREDNISOLONE 4 MG PO TBPK: ORAL_TABLET | ORAL | 0 refills | Status: DC

## 2023-01-02 NOTE — Progress Notes (Signed)
Subjective:  Patient ID: Savannah Manning, female    DOB: 06-18-1961,  MRN: [id number] HPI Chief Complaint  Patient presents with   Foot Pain    Plantar heel left - aching x 1 or 2, but usually pain was intermittent, now has been constant since October 2023, AM pain, tried new shoes, insole gels, PCP rx'd NSAIDS for arthritis - no help   New Patient (Initial Visit)    62 y.o. female presents with the above complaint.   ROS: Denies fever chills nausea vomit muscle aches pains calf pain back pain chest pain shortness of breath.  Past Medical History:  Diagnosis Date   Anemia    Arthritis    Asthma    GERD (gastroesophageal reflux disease)    Headache    Hypertension    Iron deficiency anemia 12/05/2018   Wears contact lenses    Past Surgical History:  Procedure Laterality Date   ABDOMINAL HYSTERECTOMY  2012   CHOLECYSTECTOMY     COLONOSCOPY WITH PROPOFOL N/A 12/18/2019   Procedure: COLONOSCOPY WITH PROPOFOL;  Surgeon: Lin Landsman, MD;  Location: [place];  Service: Gastroenterology;  Laterality: N/A;   DILATION AND CURETTAGE OF UTERUS     ESOPHAGOGASTRODUODENOSCOPY (EGD) WITH PROPOFOL N/A 03/18/2020   Procedure: ESOPHAGOGASTRODUODENOSCOPY (EGD) WITH PROPOFOL;  Surgeon: Lin Landsman, MD;  Location: [facility] ENDOSCOPY;  Service: Gastroenterology;  Laterality: N/A;   GASTRIC BYPASS  2005   INDUCED ABORTION N/A 1997   patient was about 22 weeks   POLYPECTOMY  12/18/2019   Procedure: POLYPECTOMY;  Surgeon: Lin Landsman, MD;  Location: [place];  Service: Gastroenterology;;   SHOULDER ARTHROSCOPY WITH OPEN ROTATOR CUFF REPAIR Left 06/14/2016   Procedure: SHOULDER ARTHROSCOPY WITH OPEN ROTATOR CUFF REPAIR, DECOMPRESSION, EXCISION LOOSE BODY, DEBRIDEMENT;  Surgeon: Corky Mull, MD;  Location: [facility];  Service: Orthopedics;  Laterality: Left;    Current Outpatient Medications:    meloxicam (MOBIC) 15 MG tablet, Take 1 tablet (15 mg total) by mouth  daily., Disp: 30 tablet, Rfl: 3   methylPREDNISolone (MEDROL DOSEPAK) 4 MG TBPK tablet, 6 day dose pack - take as directed, Disp: 21 tablet, Rfl: 0   acetaminophen (TYLENOL) 325 MG tablet, Take 325 mg by mouth every 6 (six) hours as needed. PRN, Disp: , Rfl:    albuterol (VENTOLIN HFA) 108 (90 Base) MCG/ACT inhaler, Inhale 2 puffs into the lungs every 6 (six) hours as needed for wheezing or shortness of breath., Disp: 18 g, Rfl: 0   amLODipine (NORVASC) 2.5 MG tablet, Take 1 tablet (2.5 mg total) by mouth daily., Disp: 90 tablet, Rfl: 1   aspirin EC 81 MG tablet, Take 1 tablet (81 mg total) by mouth daily. Swallow whole., Disp: , Rfl:    buPROPion (WELLBUTRIN XL) 300 MG 24 hr tablet, Take 1 tablet (300 mg total) by mouth daily., Disp: 90 tablet, Rfl: 1   diclofenac (VOLTAREN) 75 MG EC tablet, Take 1 tablet (75 mg total) by mouth 2 (two) times daily., Disp: 30 tablet, Rfl: 0   fluticasone (FLONASE) 50 MCG/ACT nasal spray, SHAKE LIQUID AND USE 2 SPRAYS IN EACH NOSTRIL DAILY, Disp: 48 g, Rfl: 0   furosemide (LASIX) 20 MG tablet, Take 1 tablet (20 mg total) by mouth daily as needed. For leg swelling, Disp: 30 tablet, Rfl: 5   loratadine (CLARITIN) 10 MG tablet, Take 1 tablet (10 mg total) by mouth daily., Disp: 90 tablet, Rfl: 1   montelukast (SINGULAIR) 10 MG tablet, Take  1 tablet (10 mg total) by mouth at bedtime., Disp: 90 tablet, Rfl: 1   nystatin cream (MYCOSTATIN), Apply 1 Application topically 2 (two) times daily., Disp: 30 g, Rfl: 5   omeprazole (PRILOSEC) 40 MG capsule, Take 1 capsule (40 mg total) by mouth daily., Disp: 90 capsule, Rfl: 1   QUEtiapine (SEROQUEL) 25 MG tablet, Take 1 tablet (25 mg total) by mouth at bedtime., Disp: 90 tablet, Rfl: 1   rosuvastatin (CRESTOR) 40 MG tablet, Take 1 tablet (40 mg total) by mouth daily., Disp: 90 tablet, Rfl: 1   tirzepatide (ZEPBOUND) 2.5 MG/0.5ML Pen, Inject 2.5 mg into the skin once a week., Disp: 2 mL, Rfl: 0   triamterene-hydrochlorothiazide  (MAXZIDE-25) 37.5-25 MG tablet, Take 1 tablet by mouth daily. In place of HCTZ 25 , BP medication, Disp: 90 tablet, Rfl: 1   Vitamin D, Ergocalciferol, (DRISDOL) 1.25 MG (50000 UNIT) CAPS capsule, Take 1 capsule (50,000 Units total) by mouth every 7 (seven) days., Disp: 12 capsule, Rfl: 1  Allergies  Allergen Reactions   Ace Inhibitors     angioedema   Codeine Swelling   Valsartan Swelling   Review of Systems Objective:  There were no vitals filed for this visit.  General: Well developed, nourished, in no acute distress, alert and oriented x3   Dermatological: Skin is warm, dry and supple bilateral. Nails x 10 are well maintained; remaining integument appears unremarkable at this time. There are no open sores, no preulcerative lesions, no rash or signs of infection present.  Vascular: Dorsalis Pedis artery and Posterior Tibial artery pedal pulses are 2/4 bilateral with immedate capillary fill time. Pedal hair growth present. No varicosities and no lower extremity edema present bilateral.   Neruologic: Grossly intact via light touch bilateral. Vibratory intact via tuning fork bilateral. Protective threshold with Semmes Wienstein monofilament intact to all pedal sites bilateral. Patellar and Achilles deep tendon reflexes 2+ bilateral. No Babinski or clonus noted bilateral.   Musculoskeletal: No gross boney pedal deformities bilateral. No pain, crepitus, or limitation noted with foot and ankle range of motion bilateral. Muscular strength 5/5 in all groups tested bilateral.  Pain on palpation medial calcaneal tubercle of the left heel.  Gait: Unassisted, Nonantalgic.    Radiographs:  Pes planus is noted an osseously mature individual soft tissue increase in density plantar fascial Caney insertion site no acute findings otherwise noted.  Assessment & Plan:   Assessment: Pes planovalgus and Planter fasciitis left.  Plan: Discussed etiology pathology conservative versus surgical  therapies.  I wanted to give her injections but she declined started her on methylprednisolone to be followed by meloxicam.  I will follow-up with her in 1 month.  Discussed appropriate shoe gear stretching exercise ice therapy sugar modifications.     Asmar Brozek T. [place], [place]

## 2023-01-18 ENCOUNTER — Other Ambulatory Visit: Payer: Self-pay | Admitting: Family Medicine

## 2023-01-18 MED ORDER — ZEPBOUND 7.5 MG/0.5ML ~~LOC~~ SOAJ: 7.5000 mg | SUBCUTANEOUS | 0 refills | Status: DC

## 2023-01-18 MED ORDER — ZEPBOUND 5 MG/0.5ML ~~LOC~~ SOAJ: 5.0000 mg | SUBCUTANEOUS | 0 refills | Status: DC

## 2023-01-22 ENCOUNTER — Encounter: Payer: Self-pay | Admitting: Oncology

## 2023-01-22 ENCOUNTER — Inpatient Hospital Stay (HOSPITAL_BASED_OUTPATIENT_CLINIC_OR_DEPARTMENT_OTHER): Payer: BC Managed Care – PPO | Admitting: Oncology

## 2023-01-22 ENCOUNTER — Inpatient Hospital Stay: Payer: BC Managed Care – PPO

## 2023-01-22 ENCOUNTER — Inpatient Hospital Stay: Payer: BC Managed Care – PPO | Attending: Oncology

## 2023-01-22 VITALS — BP 135/83 | HR 59 | Temp 97.5°F | Resp 18 | Ht 67.0 in | Wt 269.0 lb

## 2023-01-22 DIAGNOSIS — E538 Deficiency of other specified B group vitamins: Secondary | ICD-10-CM

## 2023-01-22 DIAGNOSIS — Z7982 Long term (current) use of aspirin: Secondary | ICD-10-CM | POA: Diagnosis not present

## 2023-01-22 DIAGNOSIS — I10 Essential (primary) hypertension: Secondary | ICD-10-CM | POA: Insufficient documentation

## 2023-01-22 DIAGNOSIS — D509 Iron deficiency anemia, unspecified: Secondary | ICD-10-CM | POA: Diagnosis not present

## 2023-01-22 DIAGNOSIS — D508 Other iron deficiency anemias: Secondary | ICD-10-CM | POA: Diagnosis not present

## 2023-01-22 DIAGNOSIS — Z79899 Other long term (current) drug therapy: Secondary | ICD-10-CM | POA: Insufficient documentation

## 2023-01-22 LAB — CBC WITH DIFFERENTIAL (CANCER CENTER ONLY)
Abs Immature Granulocytes: 0.01 10*3/uL (ref 0.00–0.07)
Basophils Absolute: 0 10*3/uL (ref 0.0–0.1)
Basophils Relative: 0 %
Eosinophils Absolute: 0.1 10*3/uL (ref 0.0–0.5)
Eosinophils Relative: 2 %
HCT: 38.8 % (ref 36.0–46.0)
Hemoglobin: 13.3 g/dL (ref 12.0–15.0)
Immature Granulocytes: 0 %
Lymphocytes Relative: 29 %
Lymphs Abs: 1.6 10*3/uL (ref 0.7–4.0)
MCH: 30.8 pg (ref 26.0–34.0)
MCHC: 34.3 g/dL (ref 30.0–36.0)
MCV: 89.8 fL (ref 80.0–100.0)
Monocytes Absolute: 0.3 10*3/uL (ref 0.1–1.0)
Monocytes Relative: 6 %
Neutro Abs: 3.5 10*3/uL (ref 1.7–7.7)
Neutrophils Relative %: 63 %
Platelet Count: 211 10*3/uL (ref 150–400)
RBC: 4.32 MIL/uL (ref 3.87–5.11)
RDW: 12.9 % (ref 11.5–15.5)
WBC Count: 5.6 10*3/uL (ref 4.0–10.5)
nRBC: 0 % (ref 0.0–0.2)

## 2023-01-22 LAB — IRON AND TIBC
Iron: 72 ug/dL (ref 28–170)
Saturation Ratios: 20 % (ref 10.4–31.8)
TIBC: 353 ug/dL (ref 250–450)
UIBC: 281 ug/dL

## 2023-01-22 LAB — FERRITIN: Ferritin: 151 ng/mL (ref 11–307)

## 2023-01-22 MED ORDER — CYANOCOBALAMIN 1000 MCG/ML IJ SOLN
1000.0000 ug | Freq: Once | INTRAMUSCULAR | Status: AC
  Administered 2023-01-22: 1000 ug via INTRAMUSCULAR
  Filled 2023-01-22: qty 1

## 2023-01-22 NOTE — Progress Notes (Signed)
No concerns. 

## 2023-01-24 ENCOUNTER — Other Ambulatory Visit: Payer: Self-pay | Admitting: Family Medicine

## 2023-01-24 DIAGNOSIS — F321 Major depressive disorder, single episode, moderate: Secondary | ICD-10-CM

## 2023-01-25 NOTE — Telephone Encounter (Signed)
Pt states she is no longer taking this med

## 2023-01-27 ENCOUNTER — Encounter: Payer: Self-pay | Admitting: Oncology

## 2023-01-27 ENCOUNTER — Other Ambulatory Visit: Payer: Self-pay | Admitting: Family Medicine

## 2023-01-27 DIAGNOSIS — F321 Major depressive disorder, single episode, moderate: Secondary | ICD-10-CM

## 2023-01-27 NOTE — Progress Notes (Signed)
Hematology/Oncology Consult note [facility]  Telephone:([phone] [phone]  Patient Care Team: Steele Sizer, MD as PCP - General (Family Medicine) Rod Can, CNM as Midwife (Obstetrics) Sindy Guadeloupe, MD as Medical Oncologist (Hematology and Oncology)   Name of the patient: Savannah Manning  NX:[id number]  [birth year]   Date of visit: 01/27/23  Diagnosis- history of gastric bypass and iron and B12 deficiency anemia   Chief complaint/ Reason for visit- routine f/u of anemia  Heme/Onc history: Patient is a 62 year old female with history of gastric bypass surgery in 2005 and subsequent iron and B12 deficiency anemia.  Hysterectomy in 2012.  Last colonoscopy in February 2021.  EGD in May 2021 which showed Roux-en-Y gastrojejunostomy with GJ anastomosis characterized by healthy-appearing mucosa.  She is currently on monthly B12 injections    Interval history- she is do\ing well. Denies any complaints at this time other than fatigue  ECOG PS- 1 Pain scale- 0   Review of systems- Review of Systems  Constitutional:  Positive for malaise/fatigue. Negative for chills, fever and weight loss.  HENT:  Negative for congestion, ear discharge and nosebleeds.   Eyes:  Negative for blurred vision.  Respiratory:  Negative for cough, hemoptysis, sputum production, shortness of breath and wheezing.   Cardiovascular:  Negative for chest pain, palpitations, orthopnea and claudication.  Gastrointestinal:  Negative for abdominal pain, blood in stool, constipation, diarrhea, heartburn, melena, nausea and vomiting.  Genitourinary:  Negative for dysuria, flank pain, frequency, hematuria and urgency.  Musculoskeletal:  Negative for back pain, joint pain and myalgias.  Skin:  Negative for rash.  Neurological:  Negative for dizziness, tingling, focal weakness, seizures, weakness and headaches.  Endo/Heme/Allergies:  Does not bruise/bleed easily.   Psychiatric/Behavioral:  Negative for depression and suicidal ideas. The patient does not have insomnia.       Allergies  Allergen Reactions   Ace Inhibitors     angioedema   Codeine Swelling   Valsartan Swelling     Past Medical History:  Diagnosis Date   Anemia    Arthritis    Asthma    GERD (gastroesophageal reflux disease)    Headache    Hypertension    Iron deficiency anemia 12/05/2018   Wears contact lenses      Past Surgical History:  Procedure Laterality Date   ABDOMINAL HYSTERECTOMY  2012   CHOLECYSTECTOMY     COLONOSCOPY WITH PROPOFOL N/A 12/18/2019   Procedure: COLONOSCOPY WITH PROPOFOL;  Surgeon: Lin Landsman, MD;  Location: [place];  Service: Gastroenterology;  Laterality: N/A;   DILATION AND CURETTAGE OF UTERUS     ESOPHAGOGASTRODUODENOSCOPY (EGD) WITH PROPOFOL N/A 03/18/2020   Procedure: ESOPHAGOGASTRODUODENOSCOPY (EGD) WITH PROPOFOL;  Surgeon: Lin Landsman, MD;  Location: [facility];  Service: Gastroenterology;  Laterality: N/A;   GASTRIC BYPASS  2005   INDUCED ABORTION N/A 1997   patient was about 22 weeks   POLYPECTOMY  12/18/2019   Procedure: POLYPECTOMY;  Surgeon: Lin Landsman, MD;  Location: [place];  Service: Gastroenterology;;   SHOULDER ARTHROSCOPY WITH OPEN ROTATOR CUFF REPAIR Left 06/14/2016   Procedure: SHOULDER ARTHROSCOPY WITH OPEN ROTATOR CUFF REPAIR, DECOMPRESSION, EXCISION LOOSE BODY, DEBRIDEMENT;  Surgeon: Corky Mull, MD;  Location: [facility];  Service: Orthopedics;  Laterality: Left;    Social History   Socioeconomic History   Marital status: Single    Spouse name: Not on file   Number of children: 1   Years of education: Not  on file   Highest education level: Not on file  Occupational History   Not on file  Tobacco Use   Smoking status: Never   Smokeless tobacco: Never  Vaping Use   Vaping Use: Never used  Substance and Sexual Activity   Alcohol use: No    Alcohol/week: 0.0  standard drinks of alcohol   Drug use: No   Sexual activity: Not Currently  Other Topics Concern   Not on file  Social History Narrative   Desk job/works for insurance; no smoking; no alcohol; lives in [place].    Social Determinants of Health   Financial Resource Strain: Low Risk  (06/12/2022)   Overall Financial Resource Strain (CARDIA)    Difficulty of Paying Living Expenses: Not hard at all  Food Insecurity: No Food Insecurity (06/12/2022)   Hunger Vital Sign    Worried About Running Out of Food in the Last Year: Never true    Ran Out of Food in the Last Year: Never true  Transportation Needs: No Transportation Needs (06/12/2022)   PRAPARE - Hydrologist (Medical): No    Lack of Transportation (Non-Medical): No  Physical Activity: Inactive (06/12/2022)   Exercise Vital Sign    Days of Exercise per Week: 0 days    Minutes of Exercise per Session: 0 min  Stress: Stress Concern Present (06/12/2022)   [place]    Feeling of Stress : To some extent  Social Connections: Moderately Integrated (06/12/2022)   Social Connection and Isolation Panel [NHANES]    Frequency of Communication with Friends and Family: More than three times a week    Frequency of Social Gatherings with Friends and Family: Not on file    Attends Religious Services: More than 4 times per year    Active Member of [employer] or Organizations: Yes    Attends Archivist Meetings: 1 to 4 times per year    Marital Status: Never married  Intimate Partner Violence: Not At Risk (06/12/2022)   Humiliation, Afraid, Rape, and Kick questionnaire    Fear of Current or Ex-Partner: No    Emotionally Abused: No    Physically Abused: No    Sexually Abused: No    Family History  Problem Relation Age of Onset   Hypertension Mother    Cancer Father        prostate   Heart disease Brother    Breast cancer Cousin 17       mat cousin      Current Outpatient Medications:    acetaminophen (TYLENOL) 325 MG tablet, Take 325 mg by mouth every 6 (six) hours as needed. PRN, Disp: , Rfl:    albuterol (VENTOLIN HFA) 108 (90 Base) MCG/ACT inhaler, Inhale 2 puffs into the lungs every 6 (six) hours as needed for wheezing or shortness of breath., Disp: 18 g, Rfl: 0   amLODipine (NORVASC) 2.5 MG tablet, Take 1 tablet (2.5 mg total) by mouth daily., Disp: 90 tablet, Rfl: 1   aspirin EC 81 MG tablet, Take 1 tablet (81 mg total) by mouth daily. Swallow whole., Disp: , Rfl:    buPROPion (WELLBUTRIN XL) 300 MG 24 hr tablet, Take 1 tablet (300 mg total) by mouth daily., Disp: 90 tablet, Rfl: 1   diclofenac (VOLTAREN) 75 MG EC tablet, Take 1 tablet (75 mg total) by mouth 2 (two) times daily., Disp: 30 tablet, Rfl: 0   fluticasone (FLONASE) 50 MCG/ACT nasal spray,  SHAKE LIQUID AND USE 2 SPRAYS IN EACH NOSTRIL DAILY, Disp: 48 g, Rfl: 0   furosemide (LASIX) 20 MG tablet, Take 1 tablet (20 mg total) by mouth daily as needed. For leg swelling, Disp: 30 tablet, Rfl: 5   loratadine (CLARITIN) 10 MG tablet, Take 1 tablet (10 mg total) by mouth daily., Disp: 90 tablet, Rfl: 1   meloxicam (MOBIC) 15 MG tablet, Take 1 tablet (15 mg total) by mouth daily., Disp: 30 tablet, Rfl: 3   montelukast (SINGULAIR) 10 MG tablet, Take 1 tablet (10 mg total) by mouth at bedtime., Disp: 90 tablet, Rfl: 1   nystatin cream (MYCOSTATIN), Apply 1 Application topically 2 (two) times daily., Disp: 30 g, Rfl: 5   omeprazole (PRILOSEC) 40 MG capsule, Take 1 capsule (40 mg total) by mouth daily., Disp: 90 capsule, Rfl: 1   QUEtiapine (SEROQUEL) 25 MG tablet, Take 1 tablet (25 mg total) by mouth at bedtime., Disp: 90 tablet, Rfl: 1   rosuvastatin (CRESTOR) 40 MG tablet, Take 1 tablet (40 mg total) by mouth daily., Disp: 90 tablet, Rfl: 1   tirzepatide (ZEPBOUND) 5 MG/0.5ML Pen, Inject 5 mg into the skin once a week., Disp: 2 mL, Rfl: 0   tirzepatide (ZEPBOUND) 7.5 MG/0.5ML Pen,  Inject 7.5 mg into the skin once a week. After the 5 mg dose, Disp: 2 mL, Rfl: 0   triamterene-hydrochlorothiazide (MAXZIDE-25) 37.5-25 MG tablet, Take 1 tablet by mouth daily. In place of HCTZ 25 , BP medication, Disp: 90 tablet, Rfl: 1   Vitamin D, Ergocalciferol, (DRISDOL) 1.25 MG (50000 UNIT) CAPS capsule, Take 1 capsule (50,000 Units total) by mouth every 7 (seven) days., Disp: 12 capsule, Rfl: 1  Physical exam:  Vitals:   01/22/23 1359  BP: 135/83  Pulse: (!) 59  Resp: 18  Temp: (!) 97.5 F (36.4 C)  TempSrc: Tympanic  Weight: 269 lb (122 kg)  Height: 5\' 7"  (1.702 m)   Physical Exam Cardiovascular:     Rate and Rhythm: Normal rate and regular rhythm.     Heart sounds: Normal heart sounds.  Pulmonary:     Effort: Pulmonary effort is normal.     Breath sounds: Normal breath sounds.  Abdominal:     General: Bowel sounds are normal.     Palpations: Abdomen is soft.  Skin:    General: Skin is warm and dry.  Neurological:     Mental Status: She is alert and oriented to person, place, and time.         Latest Ref Rng & Units 01/23/2022    3:34 PM  CMP  Glucose 65 - 99 mg/dL 84   BUN 7 - 25 mg/dL 13   Creatinine 0.50 - 1.03 mg/dL 0.84   Sodium 135 - 146 mmol/L 139   Potassium 3.5 - 5.3 mmol/L 4.6   Chloride 98 - 110 mmol/L 103   CO2 20 - 32 mmol/L 28   Calcium 8.6 - 10.4 mg/dL 9.2   Total Protein 6.1 - 8.1 g/dL 6.9   Total Bilirubin 0.2 - 1.2 mg/dL 0.6   AST 10 - 35 U/L 13   ALT 6 - 29 U/L 10       Latest Ref Rng & Units 01/22/2023    1:50 PM  CBC  WBC 4.0 - 10.5 K/uL 5.6   Hemoglobin 12.0 - 15.0 g/dL 13.3   Hematocrit 36.0 - 46.0 % 38.8   Platelets 150 - 400 K/uL 211     No images are  attached to the encounter.  DG Foot Complete Left  Result Date: 01/02/2023 Please see detailed radiograph report in office note.    Assessment and plan- Patient is a 62 y.o. female with h/o gastric bypass here for f/u of anemia  Not presently anemic with an H&H of  13.3/38.8.  MCV is normal.  Ferritin is normal at 151.  She does not require any IV iron at this time.  She has history of B12 deficiency and will continue to get monthly B12 injections.  CBC ferritin and iron studies in 3 and 6 months and I will see her back in 6 months.  B12 level to be checked in 6 months   Visit Diagnosis 1. B12 deficiency   2. Iron deficiency anemia, unspecified iron deficiency anemia type      Dr. Randa Evens, MD, MPH [facility] [id number] 01/27/2023 5:13 PM

## 2023-01-28 NOTE — Telephone Encounter (Signed)
Lvm to call

## 2023-01-29 ENCOUNTER — Ambulatory Visit: Payer: BC Managed Care – PPO | Admitting: Oncology

## 2023-01-29 ENCOUNTER — Other Ambulatory Visit: Payer: BC Managed Care – PPO

## 2023-01-29 ENCOUNTER — Ambulatory Visit: Payer: BC Managed Care – PPO

## 2023-02-03 ENCOUNTER — Other Ambulatory Visit: Payer: Self-pay | Admitting: Family Medicine

## 2023-02-03 DIAGNOSIS — K219 Gastro-esophageal reflux disease without esophagitis: Secondary | ICD-10-CM

## 2023-02-14 ENCOUNTER — Other Ambulatory Visit: Payer: Self-pay | Admitting: Family Medicine

## 2023-02-20 ENCOUNTER — Ambulatory Visit (INDEPENDENT_AMBULATORY_CARE_PROVIDER_SITE_OTHER): Payer: BC Managed Care – PPO | Admitting: Podiatry

## 2023-02-20 DIAGNOSIS — M722 Plantar fascial fibromatosis: Secondary | ICD-10-CM

## 2023-02-20 MED ORDER — MELOXICAM 15 MG PO TABS: 15.0000 mg | ORAL_TABLET | Freq: Every day | ORAL | 3 refills | Status: DC

## 2023-02-20 NOTE — Progress Notes (Signed)
She presents today for follow-up of her Planter fasciitis she states that she is 80% or better she states that she is only been taking her meloxicam only on an as-needed basis.  She states that she does not hurt like she did.  She is very grateful.  Objective: Vital signs stable alert oriented x 3 there is no erythema edema salines drainage odor no reproduction of pain on palpation.  Assessment: Resolving Planter fasciitis.  Plan: Instructed her to continue the use of the plantar fascia brace and the meloxicam.  I want her to follow-up with me in 6 weeks if necessary.

## 2023-02-22 ENCOUNTER — Inpatient Hospital Stay: Payer: BC Managed Care – PPO

## 2023-02-25 ENCOUNTER — Inpatient Hospital Stay: Payer: BC Managed Care – PPO | Attending: Oncology

## 2023-02-25 DIAGNOSIS — D508 Other iron deficiency anemias: Secondary | ICD-10-CM | POA: Insufficient documentation

## 2023-02-25 DIAGNOSIS — E538 Deficiency of other specified B group vitamins: Secondary | ICD-10-CM

## 2023-02-25 MED ORDER — CYANOCOBALAMIN 1000 MCG/ML IJ SOLN
1000.0000 ug | Freq: Once | INTRAMUSCULAR | Status: AC
  Administered 2023-02-25: 1000 ug via INTRAMUSCULAR
  Filled 2023-02-25: qty 1

## 2023-03-12 ENCOUNTER — Other Ambulatory Visit: Payer: Self-pay | Admitting: Family Medicine

## 2023-03-12 DIAGNOSIS — Z1231 Encounter for screening mammogram for malignant neoplasm of breast: Secondary | ICD-10-CM

## 2023-03-12 NOTE — Progress Notes (Unsigned)
Name: Savannah Manning   MRN: [id number]    DOB: [birth year]   Date:03/13/2023       Progress Note  Subjective  Chief Complaint  Follow Up  HPI  MDD: Father has dementia and is stressful taking care of him. Her son is going to school in [place] and she worries about him She is also on productivity at work and is very stressful for her . She has been depression for a long time. We gave her Lexapro rx Spring 2023 but she states she didn't like how it made her feel and stopped taking it . She could not tolerate duloxetine - it caused nausea She is taking Seroquel prn at night to sleep and we added wellbutrin 06/2022, phq 9 was 10 last visit but is down to 5 now She states she has her moments but not staying depressed . It seems like weight loss has helped with her depression    Obesity/prediabetes: .  She has been obese since childhood. She has tried multiple diets in the past, she had gastric bypass in 2005. Weight before surgery was 314 lbs, she went down to 164 lbs, but has been gradually gaining weight. She states her weight has gone up since she started to work from home in  2010.  She had her surgery done at [facility], and was denied for a revision in the Summer 2018.  She has failed Metformin, started her on Ozempic  04/2017 her weight was 259 lbs, her weight went as low as 239 lbs. Her insurance has denied Ozempic, only paying for patients that have DM. She states is not covering Saxenda again, we gave rx of Contrave Dec 2021 but she stopped due to cost. She gained more weight and we resumed Saxenda March 2023 her weight at the start of therapy was 278 lbs and it went  down to 264 lbs August 2023 - we added Contrave in 2023 however she we switched to wellbutrin XL she is now out of saxenda due to [employer], , on her visit in Feb 2024 her weight was 272 lbs and we started her on Zepbound 2.5 mg she is no 7.5 mg dose, she has lost 22 lbs int he past 3 months, she has noticed it curbs her appetite, mild  loose stools but responding well to therapy without major side effects. We will adjust dose to 10 mg today   Asthma mild intermittent: she is doing well, nocturnal cough improved, stable on singulair , advised to also take antihistamines this time of the year    HTN:  She denies chest pain or palpitation. She is on Maxzide and norvasc , bp is at goal today continue current regiment. Explained Norvasc may cause edema, however cannot take ARB/ACE or a beta blocker due to bradycardia and allergies. She was seen by cardiologist and furosemide was added for ankle edema, she takes it prn only   Dyslipidemia: LDL was 135, she is taking rosuvastatin , she is on 40 mg dose - keep follow up with Dr. Myriam Forehand .Unchanged   Vitamin D deficiency/ vitamin B12 deficiency : continue rx vitamin D, she is getting b12 shots at hematologist   Iron deficiency anemia: seen by hematologist and had some iron infusion, due to malabsorption secondary to bariatric surgery   Dumping syndrome: she states doing well most of the time - she states loose stools since started on Zepbound but no significant change   GERD: she is taking Omeprazole given by Dr. Allegra Lai,  doing well at this time   Left plantar fascitis: symptoms started en of 2023, she has tried otc insoles, braces, socks, topical medications without help. She is seeing Dr. Al Corpus and was given Meloxicam, explained she should not take nsaid's due to history of bariatric surgery   Patient Active Problem List   Diagnosis Date Noted   Moderate major depression (HCC) 05/01/2022   Intertrigo 05/01/2022   Insomnia due to mental condition 05/01/2022   Mild intermittent asthma without complication 10/20/2020   Primary osteoarthritis of both knees 10/20/2020   Primary osteoarthritis of right knee 04/06/2020   Primary osteoarthritis of left knee 04/06/2020   Pre-diabetes 12/05/2018   Malabsorption of iron 11/07/2018   Chronic pain of right knee 07/26/2017   Vitamin D  deficiency 04/24/2017   B12 deficiency 04/24/2017   Abnormal mammogram of right breast 01/01/2017   Asthma, mild persistent 12/17/2016   History of shoulder surgery 10/12/2016   Primary osteoarthritis of left shoulder 06/14/2016   Incomplete tear of left rotator cuff 05/30/2016   Rotator cuff tendinitis 05/30/2016   History of bariatric surgery 05/04/2016   Allergic rhinitis 05/12/2015   Benign essential HTN 05/12/2015   Dyslipidemia 05/12/2015   Bilateral lower extremity edema 05/12/2015   Gastroesophageal reflux disease without esophagitis 05/12/2015   Migraine 05/12/2015   Plantar fasciitis 05/12/2015   Umbilical hernia without obstruction and without gangrene 05/12/2015   Morbid obesity with BMI of 40.0-44.9, adult (HCC) 02/13/2008    Past Surgical History:  Procedure Laterality Date   ABDOMINAL HYSTERECTOMY  2012   CHOLECYSTECTOMY     COLONOSCOPY WITH PROPOFOL N/A 12/18/2019   Procedure: COLONOSCOPY WITH PROPOFOL;  Surgeon: Toney Reil, MD;  Location: [facility];  Service: Gastroenterology;  Laterality: N/A;   DILATION AND CURETTAGE OF UTERUS     ESOPHAGOGASTRODUODENOSCOPY (EGD) WITH PROPOFOL N/A 03/18/2020   Procedure: ESOPHAGOGASTRODUODENOSCOPY (EGD) WITH PROPOFOL;  Surgeon: Toney Reil, MD;  Location: [facility];  Service: Gastroenterology;  Laterality: N/A;   GASTRIC BYPASS  2005   INDUCED ABORTION N/A 1997   patient was about 22 weeks   POLYPECTOMY  12/18/2019   Procedure: POLYPECTOMY;  Surgeon: Toney Reil, MD;  Location: [facility];  Service: Gastroenterology;;   SHOULDER ARTHROSCOPY WITH OPEN ROTATOR CUFF REPAIR Left 06/14/2016   Procedure: SHOULDER ARTHROSCOPY WITH OPEN ROTATOR CUFF REPAIR, DECOMPRESSION, EXCISION LOOSE BODY, DEBRIDEMENT;  Surgeon: Christena Flake, MD;  Location: [facility];  Service: Orthopedics;  Laterality: Left;    Family History  Problem Relation Age of Onset   Hypertension Mother    Cancer Father         prostate   Heart disease Brother    Breast cancer Cousin 40       mat cousin    Social History   Tobacco Use   Smoking status: Never   Smokeless tobacco: Never  Substance Use Topics   Alcohol use: No    Alcohol/week: 0.0 standard drinks of alcohol     Current Outpatient Medications:    acetaminophen (TYLENOL) 325 MG tablet, Take 325 mg by mouth every 6 (six) hours as needed. PRN, Disp: , Rfl:    albuterol (VENTOLIN HFA) 108 (90 Base) MCG/ACT inhaler, Inhale 2 puffs into the lungs every 6 (six) hours as needed for wheezing or shortness of breath., Disp: 18 g, Rfl: 0   amLODipine (NORVASC) 2.5 MG tablet, Take 1 tablet (2.5 mg total) by mouth daily., Disp: 90 tablet, Rfl: 1   aspirin EC  81 MG tablet, Take 1 tablet (81 mg total) by mouth daily. Swallow whole., Disp: , Rfl:    buPROPion (WELLBUTRIN XL) 300 MG 24 hr tablet, Take 1 tablet (300 mg total) by mouth daily., Disp: 90 tablet, Rfl: 1   fluticasone (FLONASE) 50 MCG/ACT nasal spray, SHAKE LIQUID AND USE 2 SPRAYS IN EACH NOSTRIL DAILY, Disp: 48 g, Rfl: 0   furosemide (LASIX) 20 MG tablet, Take 1 tablet (20 mg total) by mouth daily as needed. For leg swelling, Disp: 30 tablet, Rfl: 5   loratadine (CLARITIN) 10 MG tablet, Take 1 tablet (10 mg total) by mouth daily., Disp: 90 tablet, Rfl: 1   meloxicam (MOBIC) 15 MG tablet, Take 1 tablet (15 mg total) by mouth daily., Disp: 30 tablet, Rfl: 3   montelukast (SINGULAIR) 10 MG tablet, Take 1 tablet (10 mg total) by mouth at bedtime., Disp: 90 tablet, Rfl: 1   nystatin cream (MYCOSTATIN), Apply 1 Application topically 2 (two) times daily., Disp: 30 g, Rfl: 5   omeprazole (PRILOSEC) 40 MG capsule, TAKE 1 CAPSULE(40 MG) BY MOUTH DAILY, Disp: 90 capsule, Rfl: 0   QUEtiapine (SEROQUEL) 25 MG tablet, Take 1 tablet (25 mg total) by mouth at bedtime., Disp: 90 tablet, Rfl: 1   rosuvastatin (CRESTOR) 40 MG tablet, Take 1 tablet (40 mg total) by mouth daily., Disp: 90 tablet, Rfl: 1    triamterene-hydrochlorothiazide (MAXZIDE-25) 37.5-25 MG tablet, Take 1 tablet by mouth daily. In place of HCTZ 25 , BP medication, Disp: 90 tablet, Rfl: 1   Vitamin D, Ergocalciferol, (DRISDOL) 1.25 MG (50000 UNIT) CAPS capsule, Take 1 capsule (50,000 Units total) by mouth every 7 (seven) days., Disp: 12 capsule, Rfl: 1   tirzepatide (ZEPBOUND) 10 MG/0.5ML Pen, Inject 10 mg into the skin once a week., Disp: 6 mL, Rfl: 0  Allergies  Allergen Reactions   Ace Inhibitors     angioedema   Codeine Swelling   Valsartan Swelling    I personally reviewed active problem list, medication list, allergies, family history, social history, health maintenance with the patient/caregiver today.   ROS  Constitutional: Negative for fever , positive for  weight change.  Respiratory: Negative for cough and shortness of breath.   Cardiovascular: Negative for chest pain or palpitations.  Gastrointestinal: Negative for abdominal pain, no bowel changes.  Musculoskeletal: positive  for gait problem but and occasional  knee   joint swelling.  Skin: positive  for rash on anterior neck this weekend that improved with gold bond .  Neurological: Negative for dizziness or headache.  No other specific complaints in a complete review of systems (except as listed in HPI above).   Objective  Vitals:   03/13/23 1533  BP: 126/78  Pulse: 91  Resp: 16  Temp: 98.1 F (36.7 C)  TempSrc: Oral  SpO2: 96%  Weight: 250 lb (113.4 kg)  Height: 5\' 7"  (1.702 m)    Body mass index is 39.16 kg/m.  Physical Exam  Constitutional: Patient appears well-developed and well-nourished. Obese  No distress.  HEENT: head atraumatic, normocephalic, pupils equal and reactive to light, neck supple Cardiovascular: Normal rate, regular rhythm and normal heart sounds.  No murmur heard. No BLE edema. Pulmonary/Chest: Effort normal and breath sounds normal. No respiratory distress. Abdominal: Soft.  There is no tenderness. Skin: small  dry patch on right upper chest near the neck, advised topical otc hydrocortisone if needed  Psychiatric: Patient has a normal mood and affect. behavior is normal. Judgment and thought content normal.  PHQ2/9:    03/13/2023    3:34 PM 12/11/2022    2:09 PM 10/24/2022    3:21 PM 08/08/2022    1:49 PM 06/13/2022   10:10 AM  Depression screen PHQ 2/9  Decreased Interest 0 0 1 0 1  Down, Depressed, Hopeless 0 1 0 0 0  PHQ - 2 Score 0 1 1 0 1  Altered sleeping 1 3 1 3 3   Tired, decreased energy 1 1 1 1 1   Change in appetite 1 2 1  0 0  Feeling bad or failure about yourself  0 0 0 0 0  Trouble concentrating 2 3 1 3 1   Moving slowly or fidgety/restless 0 0 0 0 0  Suicidal thoughts 0 0 0 0 0  PHQ-9 Score 5 10 5 7 6   Difficult doing work/chores Not difficult at all Not difficult at all   Not difficult at all    phq 9 is negative negative    Fall Risk:    03/13/2023    3:34 PM 12/11/2022    2:09 PM 10/24/2022    3:20 PM 08/08/2022    1:39 PM 06/13/2022   10:10 AM  Fall Risk   Falls in the past year? 1 0 0 0 0  Number falls in past yr: 0  0 0   Injury with Fall? 1  0 0   Risk for fall due to : History of fall(s) No Fall Risks No Fall Risks No Fall Risks No Fall Risks  Follow up Falls prevention discussed;Education provided;Falls evaluation completed Falls prevention discussed;Education provided;Falls evaluation completed Falls prevention discussed Falls prevention discussed Falls prevention discussed      Functional Status Survey: Is the patient deaf or have difficulty hearing?: No Does the patient have difficulty seeing, even when wearing glasses/contacts?: No Does the patient have difficulty concentrating, remembering, or making decisions?: No Does the patient have difficulty walking or climbing stairs?: Yes Does the patient have difficulty dressing or bathing?: No Does the patient have difficulty doing errands alone such as visiting a doctor's office or shopping?: No    Assessment  & Plan  1. Morbid obesity (HCC)  Doing well on Zepbound - tirzepatide (ZEPBOUND) 10 MG/0.5ML Pen; Inject 10 mg into the skin once a week.  Dispense: 6 mL; Refill: 0  2. Benign essential HTN  At goal   3. Moderate major depression (HCC)  Improved, continue medications   4. History of bariatric surgery  Reminded her to stop meloxicam due to risk of GI bleed   5. Gastroesophageal reflux disease without esophagitis  Controlled   6. B12 deficiency  Continue supplementation  7. Vitamin D deficiency  Taking supplements  8. Dyslipidemia

## 2023-03-13 ENCOUNTER — Encounter: Payer: Self-pay | Admitting: Family Medicine

## 2023-03-13 ENCOUNTER — Ambulatory Visit (INDEPENDENT_AMBULATORY_CARE_PROVIDER_SITE_OTHER): Payer: BC Managed Care – PPO | Admitting: Family Medicine

## 2023-03-13 ENCOUNTER — Encounter: Payer: Self-pay | Admitting: Oncology

## 2023-03-13 ENCOUNTER — Other Ambulatory Visit: Payer: Self-pay

## 2023-03-13 DIAGNOSIS — E785 Hyperlipidemia, unspecified: Secondary | ICD-10-CM

## 2023-03-13 DIAGNOSIS — K219 Gastro-esophageal reflux disease without esophagitis: Secondary | ICD-10-CM

## 2023-03-13 DIAGNOSIS — F321 Major depressive disorder, single episode, moderate: Secondary | ICD-10-CM

## 2023-03-13 DIAGNOSIS — E538 Deficiency of other specified B group vitamins: Secondary | ICD-10-CM

## 2023-03-13 DIAGNOSIS — E559 Vitamin D deficiency, unspecified: Secondary | ICD-10-CM

## 2023-03-13 DIAGNOSIS — I10 Essential (primary) hypertension: Secondary | ICD-10-CM | POA: Diagnosis not present

## 2023-03-13 DIAGNOSIS — Z9884 Bariatric surgery status: Secondary | ICD-10-CM | POA: Diagnosis not present

## 2023-03-13 MED ORDER — ZEPBOUND 10 MG/0.5ML ~~LOC~~ SOAJ
10.0000 mg | SUBCUTANEOUS | 0 refills | Status: DC
Start: 2023-03-13 — End: 2023-03-13
  Filled 2023-03-13: qty 6, 84d supply, fill #0

## 2023-03-13 MED ORDER — ZEPBOUND 10 MG/0.5ML ~~LOC~~ SOAJ
10.0000 mg | SUBCUTANEOUS | 0 refills | Status: DC
Start: 2023-03-13 — End: 2023-04-08
  Filled 2023-03-13: qty 2, 28d supply, fill #0

## 2023-03-25 ENCOUNTER — Inpatient Hospital Stay: Payer: BC Managed Care – PPO | Attending: Oncology

## 2023-04-05 ENCOUNTER — Ambulatory Visit: Payer: BC Managed Care – PPO | Attending: Cardiology | Admitting: Cardiology

## 2023-04-05 ENCOUNTER — Encounter: Payer: Self-pay | Admitting: Cardiology

## 2023-04-05 VITALS — BP 122/80 | HR 57 | Ht 67.0 in | Wt 242.8 lb

## 2023-04-05 DIAGNOSIS — Z6838 Body mass index (BMI) 38.0-38.9, adult: Secondary | ICD-10-CM | POA: Diagnosis not present

## 2023-04-05 DIAGNOSIS — I10 Essential (primary) hypertension: Secondary | ICD-10-CM | POA: Diagnosis not present

## 2023-04-05 DIAGNOSIS — E782 Mixed hyperlipidemia: Secondary | ICD-10-CM

## 2023-04-05 NOTE — Patient Instructions (Signed)
Medication Instructions:  Your Physician recommend you continue on your current medication as directed.    *If you need a refill on your cardiac medications before your next appointment, please call your pharmacy*   Lab Work: None ordered today   Testing/Procedures: None ordered today   Follow-Up: At [facility], you and your health needs are our priority.  As part of our continuing mission to provide you with exceptional heart care, we have created designated Provider Care Teams.  These Care Teams include your primary Cardiologist (physician) and Advanced Practice Providers (APPs -  Physician Assistants and Nurse Practitioners) who all work together to provide you with the care you need, when you need it.  We recommend signing up for the patient portal called "MyChart".  Sign up information is provided on this After Visit Summary.  MyChart is used to connect with patients for Virtual Visits (Telemedicine).  Patients are able to view lab/test results, encounter notes, upcoming appointments, etc.  Non-urgent messages can be sent to your provider as well.   To learn more about what you can do with MyChart, go to [email or web address].    Your next appointment:   As needed  Provider:   You may see Brian Agbor-Etang, MD or one of the following Advanced Practice Providers on your designated Care Team:   Christopher Berge, NP Ryan Dunn, PA-C Cadence Furth, PA-C Sheri Hammock, NP     

## 2023-04-05 NOTE — Progress Notes (Signed)
Cardiology Office Note:    Date:  04/05/2023   ID:  Savannah Manning, DOB 01-May-1961, MRN [id number]  PCP:  Savannah Cory, MD   [facility] Providers Cardiologist:  Savannah Odea, MD     Referring MD: Savannah Cory, MD   Chief Complaint  Patient presents with   Follow-up    Patient denies new or acute cardiac problems/concerns today.      History of Present Illness:    Savannah Manning is a 62 y.o. female with a hx of hypertension, hyperlipidemia, obesity, who presents for follow-up.   Previously seen with symptoms of shortness of breath and obesity, workup with echo and Lexiscan Myoview was unrevealing.  Symptoms deemed secondary to morbid obesity and deconditioning.  She started taking GLP-1/zepbound with good effects, losing weight feels overall much better.  Has no complaints or concerns at this time.  Blood pressure is well-controlled.  Prior notes Echo 02/2022 EF 55 to 60% Lexiscan Myoview 02/2022, no evidence for ischemia, low risk study   Past Medical History:  Diagnosis Date   Anemia    Arthritis    Asthma    GERD (gastroesophageal reflux disease)    Headache    Hypertension    Iron deficiency anemia 12/05/2018   Wears contact lenses     Past Surgical History:  Procedure Laterality Date   ABDOMINAL HYSTERECTOMY  2012   CHOLECYSTECTOMY     COLONOSCOPY WITH PROPOFOL N/A 12/18/2019   Procedure: COLONOSCOPY WITH PROPOFOL;  Surgeon: Savannah Reil, MD;  Location: [facility];  Service: Gastroenterology;  Laterality: N/A;   DILATION AND CURETTAGE OF UTERUS     ESOPHAGOGASTRODUODENOSCOPY (EGD) WITH PROPOFOL N/A 03/18/2020   Procedure: ESOPHAGOGASTRODUODENOSCOPY (EGD) WITH PROPOFOL;  Surgeon: Savannah Reil, MD;  Location: [facility];  Service: Gastroenterology;  Laterality: N/A;   GASTRIC BYPASS  2005   INDUCED ABORTION N/A 1997   patient was about 22 weeks   POLYPECTOMY  12/18/2019   Procedure: POLYPECTOMY;  Surgeon: Savannah Reil,  MD;  Location: [facility];  Service: Gastroenterology;;   SHOULDER ARTHROSCOPY WITH OPEN ROTATOR CUFF REPAIR Left 06/14/2016   Procedure: SHOULDER ARTHROSCOPY WITH OPEN ROTATOR CUFF REPAIR, DECOMPRESSION, EXCISION LOOSE BODY, DEBRIDEMENT;  Surgeon: Savannah Flake, MD;  Location: [facility] ORS;  Service: Orthopedics;  Laterality: Left;    Current Medications: Current Meds  Medication Sig   acetaminophen (TYLENOL) 325 MG tablet Take 325 mg by mouth every 6 (six) hours as needed. PRN   albuterol (VENTOLIN HFA) 108 (90 Base) MCG/ACT inhaler Inhale 2 puffs into the lungs every 6 (six) hours as needed for wheezing or shortness of breath.   amLODipine (NORVASC) 2.5 MG tablet Take 1 tablet (2.5 mg total) by mouth daily.   buPROPion (WELLBUTRIN XL) 300 MG 24 hr tablet Take 1 tablet (300 mg total) by mouth daily.   fluticasone (FLONASE) 50 MCG/ACT nasal spray SHAKE LIQUID AND USE 2 SPRAYS IN EACH NOSTRIL DAILY   furosemide (LASIX) 20 MG tablet Take 1 tablet (20 mg total) by mouth daily as needed. For leg swelling   loratadine (CLARITIN) 10 MG tablet Take 1 tablet (10 mg total) by mouth daily.   montelukast (SINGULAIR) 10 MG tablet Take 1 tablet (10 mg total) by mouth at bedtime.   nystatin cream (MYCOSTATIN) Apply 1 Application topically 2 (two) times daily.   omeprazole (PRILOSEC) 40 MG capsule TAKE 1 CAPSULE(40 MG) BY MOUTH DAILY   rosuvastatin (CRESTOR) 40 MG tablet Take 1 tablet (40 mg  total) by mouth daily.   tirzepatide (ZEPBOUND) 10 MG/0.5ML Pen Inject 10 mg into the skin once a week.   triamterene-hydrochlorothiazide (MAXZIDE-25) 37.5-25 MG tablet Take 1 tablet by mouth daily. In place of HCTZ 25 , BP medication   Vitamin D, Ergocalciferol, (DRISDOL) 1.25 MG (50000 UNIT) CAPS capsule Take 1 capsule (50,000 Units total) by mouth every 7 (seven) days.     Allergies:   Ace inhibitors, Codeine, and Valsartan   Social History   Socioeconomic History   Marital status: Single    Spouse name:  Not on file   Number of children: 1   Years of education: Not on file   Highest education level: Not on file  Occupational History   Not on file  Tobacco Use   Smoking status: Never   Smokeless tobacco: Never  Vaping Use   Vaping Use: Never used  Substance and Sexual Activity   Alcohol use: No    Alcohol/week: 0.0 standard drinks of alcohol   Drug use: No   Sexual activity: Not Currently  Other Topics Concern   Not on file  Social History Narrative   Desk job/works for insurance; no smoking; no alcohol; lives in [place].    Social Determinants of Health   Financial Resource Strain: Low Risk  (06/12/2022)   Overall Financial Resource Strain (CARDIA)    Difficulty of Paying Living Expenses: Not hard at all  Food Insecurity: No Food Insecurity (06/12/2022)   Hunger Vital Sign    Worried About Running Out of Food in the Last Year: Never true    Ran Out of Food in the Last Year: Never true  Transportation Needs: No Transportation Needs (06/12/2022)   PRAPARE - Administrator, Civil Service (Medical): No    Lack of Transportation (Non-Medical): No  Physical Activity: Inactive (06/12/2022)   Exercise Vital Sign    Days of Exercise per Week: 0 days    Minutes of Exercise per Session: 0 min  Stress: Stress Concern Present (06/12/2022)   [employer] of Occupational Health - Occupational Stress Questionnaire    Feeling of Stress : To some extent  Social Connections: Moderately Integrated (06/12/2022)   Social Connection and Isolation Panel [NHANES]    Frequency of Communication with Friends and Family: More than three times a week    Frequency of Social Gatherings with Friends and Family: Not on file    Attends Religious Services: More than 4 times per year    Active Member of [employer] or Organizations: Yes    Attends Banker Meetings: 1 to 4 times per year    Marital Status: Never married     Family History: The patient's family history includes Breast cancer  (age of onset: 23) in her cousin; Cancer in her father; Heart disease in her brother; Hypertension in her mother.  ROS:   Please see the history of present illness.     All other systems reviewed and are negative.  EKGs/Labs/Other Studies Reviewed:    The following studies were reviewed today:   EKG:  EKG is  ordered today.  The ekg ordered today demonstrates normal sinus rhythm  Recent Labs: 01/22/2023: Hemoglobin 13.3; Platelet Count 211  Recent Lipid Panel    Component Value Date/Time   CHOL 230 (H) 10/04/2022 0743   CHOL 205 (H) 05/04/2016 1053   TRIG 149 10/04/2022 0743   HDL 65 10/04/2022 0743   HDL 64 05/04/2016 1053   CHOLHDL 3.5 10/04/2022 0743  VLDL 30 10/04/2022 0743   LDLCALC 135 (H) 10/04/2022 0743   LDLCALC 171 (H) 01/23/2022 1534     Risk Assessment/Calculations:          Physical Exam:    VS:  BP 122/80 (BP Location: Left Arm, Patient Position: Sitting, Cuff Size: Large)   Pulse (!) 57   Ht 5\' 7"  (1.702 m)   Wt 242 lb 12.8 oz (110.1 kg)   SpO2 95%   BMI 38.03 kg/m     Wt Readings from Last 3 Encounters:  04/05/23 242 lb 12.8 oz (110.1 kg)  03/13/23 250 lb (113.4 kg)  01/22/23 269 lb (122 kg)     GEN:  Well nourished, well developed in no acute distress HEENT: Normal NECK: No JVD; No carotid bruits CARDIAC: RRR, no murmurs, rubs, gallops RESPIRATORY:  Clear to auscultation without rales, wheezing or rhonchi  ABDOMEN: Soft, non-tender, non-distended MUSCULOSKELETAL:  no edema; No deformity  SKIN: Warm and dry NEUROLOGIC:  Alert and oriented x 3 PSYCHIATRIC:  Normal affect   ASSESSMENT:    1. Primary hypertension   2. Hyperlipidemia, mixed   3. BMI 38.0-38.9,adult    PLAN:    In order of problems listed above:  Hypertension, BP controlled.  Continue Maxzide, Norvasc. Hyperlipidemia, continue Crestor 40 mg daily.  Cholesterol improving, should continue improving with weight loss. Obesity, achieving adequate weight loss zepbound.   Continue low-cholesterol diet.  Follow-up as needed.     Medication Adjustments/Labs and Tests Ordered: Current medicines are reviewed at length with the patient today.  Concerns regarding medicines are outlined above.  Orders Placed This Encounter  Procedures   EKG 12-Lead   No orders of the defined types were placed in this encounter.   Patient Instructions  Medication Instructions:  Your Physician recommend you continue on your current medication as directed.     *If you need a refill on your cardiac medications before your next appointment, please call your pharmacy*   Lab Work: None ordered today   Testing/Procedures: None ordered today   Follow-Up: At [facility], you and your health needs are our priority.  As part of our continuing mission to provide you with exceptional heart care, we have created designated Provider Care Teams.  These Care Teams include your primary Cardiologist (physician) and Advanced Practice Providers (APPs -  Physician Assistants and Nurse Practitioners) who all work together to provide you with the care you need, when you need it.  We recommend signing up for the patient portal called "MyChart".  Sign up information is provided on this After Visit Summary.  MyChart is used to connect with patients for Virtual Visits (Telemedicine).  Patients are able to view lab/test results, encounter notes, upcoming appointments, etc.  Non-urgent messages can be sent to your provider as well.   To learn more about what you can do with MyChart, go to [email or web address].    Your next appointment:   As needed  Provider:   You may see Savannah Odea, MD or one of the following Advanced Practice Providers on your designated Care Team:   Nicolasa Ducking, NP Eula Listen, PA-C Cadence [name], PA-C Charlsie Quest, NP       Signed, Savannah Odea, MD  04/05/2023 4:46 PM    [facility]

## 2023-04-06 ENCOUNTER — Encounter: Payer: Self-pay | Admitting: Family Medicine

## 2023-04-08 ENCOUNTER — Other Ambulatory Visit: Payer: Self-pay | Admitting: Family Medicine

## 2023-04-08 ENCOUNTER — Other Ambulatory Visit: Payer: Self-pay

## 2023-04-08 ENCOUNTER — Encounter: Payer: Self-pay | Admitting: Family Medicine

## 2023-04-08 ENCOUNTER — Ambulatory Visit
Admission: RE | Admit: 2023-04-08 | Discharge: 2023-04-08 | Disposition: A | Discharge status: Home / Self Care | Payer: BC Managed Care – PPO | Source: Ambulatory Visit | Attending: Family Medicine | Admitting: Family Medicine

## 2023-04-08 DIAGNOSIS — Z1231 Encounter for screening mammogram for malignant neoplasm of breast: Secondary | ICD-10-CM | POA: Diagnosis not present

## 2023-04-08 MED ORDER — ZEPBOUND 12.5 MG/0.5ML ~~LOC~~ SOAJ
12.5000 mg | SUBCUTANEOUS | 0 refills | Status: DC
  Filled 2023-04-08: qty 2, 28d supply, fill #0

## 2023-04-09 ENCOUNTER — Other Ambulatory Visit: Payer: Self-pay

## 2023-04-12 NOTE — Progress Notes (Unsigned)
Name: Savannah Manning   MRN: [id number]    DOB: November 16, 1960   Date:04/12/2023       Progress Note  Subjective  Chief Complaint  Hip Pain  HPI  *** Patient Active Problem List   Diagnosis Date Noted   Morbid obesity (HCC) 03/13/2023   Moderate major depression (HCC) 05/01/2022   Intertrigo 05/01/2022   Insomnia due to mental condition 05/01/2022   Mild intermittent asthma without complication 10/20/2020   Primary osteoarthritis of both knees 10/20/2020   Primary osteoarthritis of right knee 04/06/2020   Primary osteoarthritis of left knee 04/06/2020   Pre-diabetes 12/05/2018   Malabsorption of iron 11/07/2018   Chronic pain of right knee 07/26/2017   Vitamin D deficiency 04/24/2017   B12 deficiency 04/24/2017   Abnormal mammogram of right breast 01/01/2017   Asthma, mild persistent 12/17/2016   History of shoulder surgery 10/12/2016   Primary osteoarthritis of left shoulder 06/14/2016   Incomplete tear of left rotator cuff 05/30/2016   Rotator cuff tendinitis 05/30/2016   History of bariatric surgery 05/04/2016   Allergic rhinitis 05/12/2015   Benign essential HTN 05/12/2015   Dyslipidemia 05/12/2015   Bilateral lower extremity edema 05/12/2015   Gastroesophageal reflux disease without esophagitis 05/12/2015   Migraine 05/12/2015   Plantar fasciitis 05/12/2015   Umbilical hernia without obstruction and without gangrene 05/12/2015   Morbid obesity with BMI of 40.0-44.9, adult (HCC) 02/13/2008    Past Surgical History:  Procedure Laterality Date   ABDOMINAL HYSTERECTOMY  2012   CHOLECYSTECTOMY     COLONOSCOPY WITH PROPOFOL N/A 12/18/2019   Procedure: COLONOSCOPY WITH PROPOFOL;  Surgeon: Toney Reil, MD;  Location: [facility];  Service: Gastroenterology;  Laterality: N/A;   DILATION AND CURETTAGE OF UTERUS     ESOPHAGOGASTRODUODENOSCOPY (EGD) WITH PROPOFOL N/A 03/18/2020   Procedure: ESOPHAGOGASTRODUODENOSCOPY (EGD) WITH PROPOFOL;  Surgeon: Toney Reil, MD;  Location: [facility];  Service: Gastroenterology;  Laterality: N/A;   GASTRIC BYPASS  2005   INDUCED ABORTION N/A 1997   patient was about 22 weeks   POLYPECTOMY  12/18/2019   Procedure: POLYPECTOMY;  Surgeon: Toney Reil, MD;  Location: [facility];  Service: Gastroenterology;;   SHOULDER ARTHROSCOPY WITH OPEN ROTATOR CUFF REPAIR Left 06/14/2016   Procedure: SHOULDER ARTHROSCOPY WITH OPEN ROTATOR CUFF REPAIR, DECOMPRESSION, EXCISION LOOSE BODY, DEBRIDEMENT;  Surgeon: Christena Flake, MD;  Location: [facility] OR[facility];  Service: Orthopedics;  Laterality: Left;    Family History  Problem Relation Age of Onset   Hypertension Mother    Cancer Father        prostate   Heart disease Brother    Breast cancer Cousin 40       mat cousin    Social History   Tobacco Use   Smoking status: Never   Smokeless tobacco: Never  Substance Use Topics   Alcohol use: No    Alcohol/week: 0.0 standard drinks of alcohol     Current Outpatient Medications:    tirzepatide (ZEPBOUND) 12.5 MG/0.5ML Pen, Inject 12.5 mg into the skin once a week., Disp: 2 mL, Rfl: 0   acetaminophen (TYLENOL) 325 MG tablet, Take 325 mg by mouth every 6 (six) hours as needed. PRN, Disp: , Rfl:    albuterol (VENTOLIN HFA) 108 (90 Base) MCG/ACT inhaler, Inhale 2 puffs into the lungs every 6 (six) hours as needed for wheezing or shortness of breath., Disp: 18 g, Rfl: 0   amLODipine (NORVASC) 2.5 MG tablet, Take 1 tablet (2.5 mg  total) by mouth daily., Disp: 90 tablet, Rfl: 1   aspirin EC 81 MG tablet, Take 1 tablet (81 mg total) by mouth daily. Swallow whole. (Patient not taking: Reported on 04/05/2023), Disp: , Rfl:    buPROPion (WELLBUTRIN XL) 300 MG 24 hr tablet, Take 1 tablet (300 mg total) by mouth daily., Disp: 90 tablet, Rfl: 1   fluticasone (FLONASE) 50 MCG/ACT nasal spray, SHAKE LIQUID AND USE 2 SPRAYS IN EACH NOSTRIL DAILY, Disp: 48 g, Rfl: 0   furosemide (LASIX) 20 MG tablet, Take 1 tablet (20 mg  total) by mouth daily as needed. For leg swelling, Disp: 30 tablet, Rfl: 5   loratadine (CLARITIN) 10 MG tablet, Take 1 tablet (10 mg total) by mouth daily., Disp: 90 tablet, Rfl: 1   meloxicam (MOBIC) 15 MG tablet, Take 1 tablet (15 mg total) by mouth daily. (Patient not taking: Reported on 04/05/2023), Disp: 30 tablet, Rfl: 3   montelukast (SINGULAIR) 10 MG tablet, Take 1 tablet (10 mg total) by mouth at bedtime., Disp: 90 tablet, Rfl: 1   nystatin cream (MYCOSTATIN), Apply 1 Application topically 2 (two) times daily., Disp: 30 g, Rfl: 5   omeprazole (PRILOSEC) 40 MG capsule, TAKE 1 CAPSULE(40 MG) BY MOUTH DAILY, Disp: 90 capsule, Rfl: 0   QUEtiapine (SEROQUEL) 25 MG tablet, Take 1 tablet (25 mg total) by mouth at bedtime. (Patient not taking: Reported on 04/05/2023), Disp: 90 tablet, Rfl: 1   rosuvastatin (CRESTOR) 40 MG tablet, Take 1 tablet (40 mg total) by mouth daily., Disp: 90 tablet, Rfl: 1   triamterene-hydrochlorothiazide (MAXZIDE-25) 37.5-25 MG tablet, Take 1 tablet by mouth daily. In place of HCTZ 25 , BP medication, Disp: 90 tablet, Rfl: 1   Vitamin D, Ergocalciferol, (DRISDOL) 1.25 MG (50000 UNIT) CAPS capsule, Take 1 capsule (50,000 Units total) by mouth every 7 (seven) days., Disp: 12 capsule, Rfl: 1  Allergies  Allergen Reactions   Ace Inhibitors     angioedema   Codeine Swelling   Valsartan Swelling    I personally reviewed active problem list, medication list, allergies, family history, social history, health maintenance with the patient/caregiver today.   ROS  ***  Objective  There were no vitals filed for this visit.  There is no height or weight on file to calculate BMI.  Physical Exam ***  Recent Results (from the past 2160 hour(s))  Iron and TIBC([facility])     Status: None   Collection Time: 01/22/23  1:50 PM  Result Value Ref Range   Iron 72 28 - 170 ug/dL   TIBC 409 811 - 914 ug/dL   Saturation Ratios 20 10.4 - 31.8 %   UIBC 281 ug/dL     Comment: Performed at [facility], [address] [place], [place] [address]  Ferritin     Status: None   Collection Time: 01/22/23  1:50 PM  Result Value Ref Range   Ferritin 151 11 - 307 ng/mL    Comment: Performed at [facility], [address] [place], [place] [address]  CBC with Differential (Cancer Center Only)     Status: None   Collection Time: 01/22/23  1:50 PM  Result Value Ref Range   WBC Count 5.6 4.0 - 10.5 K/uL   RBC 4.32 3.87 - 5.11 MIL/uL   Hemoglobin 13.3 12.0 - 15.0 g/dL   HCT 86.5 78.4 - 69.6 %   MCV 89.8 80.0 - 100.0 fL   MCH 30.8 26.0 - 34.0 pg   MCHC 34.3  30.0 - 36.0 g/dL   RDW 16.1 09.6 - 04.5 %   Platelet Count 211 150 - 400 K/uL   nRBC 0.0 0.0 - 0.2 %   Neutrophils Relative % 63 %   Neutro Abs 3.5 1.7 - 7.7 K/uL   Lymphocytes Relative 29 %   Lymphs Abs 1.6 0.7 - 4.0 K/uL   Monocytes Relative 6 %   Monocytes Absolute 0.3 0.1 - 1.0 K/uL   Eosinophils Relative 2 %   Eosinophils Absolute 0.1 0.0 - 0.5 K/uL   Basophils Relative 0 %   Basophils Absolute 0.0 0.0 - 0.1 K/uL   Immature Granulocytes 0 %   Abs Immature Granulocytes 0.01 0.00 - 0.07 K/uL    Comment: Performed at [facility], [address] [place], [place] [address]    PHQ2/9:    03/13/2023    3:34 PM 12/11/2022    2:09 PM 10/24/2022    3:21 PM 08/08/2022    1:49 PM 06/13/2022   10:10 AM  Depression screen PHQ 2/9  Decreased Interest 0 0 1 0 1  Down, Depressed, Hopeless 0 1 0 0 0  PHQ - 2 Score 0 1 1 0 1  Altered sleeping 1 3 1 3 3   Tired, decreased energy 1 1 1 1 1   Change in appetite 1 2 1  0 0  Feeling bad or failure about yourself  0 0 0 0 0  Trouble concentrating 2 3 1 3 1   Moving slowly or fidgety/restless 0 0 0 0 0  Suicidal thoughts 0 0 0 0 0  PHQ-9 Score 5 10 5 7 6   Difficult doing work/chores Not difficult at all Not difficult at all   Not difficult at all    phq 9 is {gen pos [id number]}   Fall Risk:    03/13/2023    3:34 PM 12/11/2022    2:09  PM 10/24/2022    3:20 PM 08/08/2022    1:39 PM 06/13/2022   10:10 AM  Fall Risk   Falls in the past year? 1 0 0 0 0  Number falls in past yr: 0  0 0   Injury with Fall? 1  0 0   Risk for fall due to : History of fall(s) No Fall Risks No Fall Risks No Fall Risks No Fall Risks  Follow up Falls prevention discussed;Education provided;Falls evaluation completed Falls prevention discussed;Education provided;Falls evaluation completed Falls prevention discussed Falls prevention discussed Falls prevention discussed      Functional Status Survey:      Assessment & Plan  *** There are no diagnoses linked to this encounter.

## 2023-04-15 ENCOUNTER — Encounter: Payer: Self-pay | Admitting: Family Medicine

## 2023-04-15 ENCOUNTER — Ambulatory Visit (INDEPENDENT_AMBULATORY_CARE_PROVIDER_SITE_OTHER): Payer: BC Managed Care – PPO | Admitting: Family Medicine

## 2023-04-15 VITALS — BP 126/74 | HR 89 | Resp 16 | Ht 67.0 in | Wt 240.0 lb

## 2023-04-15 DIAGNOSIS — M25552 Pain in left hip: Secondary | ICD-10-CM

## 2023-04-15 DIAGNOSIS — R300 Dysuria: Secondary | ICD-10-CM | POA: Diagnosis not present

## 2023-04-15 DIAGNOSIS — R3911 Hesitancy of micturition: Secondary | ICD-10-CM

## 2023-04-15 LAB — POCT URINALYSIS DIPSTICK
Appearance: NORMAL
Bilirubin, UA: NEGATIVE
Blood, UA: NEGATIVE
Glucose, UA: NEGATIVE
Ketones, UA: NEGATIVE
Leukocytes, UA: NEGATIVE
Nitrite, UA: NEGATIVE
Protein, UA: NEGATIVE
Spec Grav, UA: >=1.03 — AB (ref 1.010–1.025)
Urobilinogen, UA: 0.2 E.U./dL
pH, UA: 6 (ref 5.0–8.0)

## 2023-04-15 MED ORDER — NITROFURANTOIN MONOHYD MACRO 100 MG PO CAPS
100.0000 mg | ORAL_CAPSULE | Freq: Two times a day (BID) | ORAL | 0 refills | Status: DC
Start: 2023-04-15 — End: 2023-06-11

## 2023-04-16 LAB — CULTURE, URINE COMPREHENSIVE

## 2023-04-17 LAB — CULTURE, URINE COMPREHENSIVE
MICRO NUMBER:: 15063897
SPECIMEN QUALITY:: ADEQUATE

## 2023-04-24 ENCOUNTER — Inpatient Hospital Stay: Payer: BC Managed Care – PPO

## 2023-04-24 ENCOUNTER — Inpatient Hospital Stay: Payer: BC Managed Care – PPO | Attending: Oncology

## 2023-04-24 ENCOUNTER — Other Ambulatory Visit: Payer: BC Managed Care – PPO

## 2023-04-24 DIAGNOSIS — D508 Other iron deficiency anemias: Secondary | ICD-10-CM | POA: Insufficient documentation

## 2023-04-24 DIAGNOSIS — D509 Iron deficiency anemia, unspecified: Secondary | ICD-10-CM

## 2023-04-24 DIAGNOSIS — E538 Deficiency of other specified B group vitamins: Secondary | ICD-10-CM

## 2023-04-24 LAB — CBC WITH DIFFERENTIAL (CANCER CENTER ONLY)
Abs Immature Granulocytes: 0.02 10*3/uL (ref 0.00–0.07)
Basophils Absolute: 0 10*3/uL (ref 0.0–0.1)
Basophils Relative: 0 %
Eosinophils Absolute: 0.1 10*3/uL (ref 0.0–0.5)
Eosinophils Relative: 1 %
HCT: 38.8 % (ref 36.0–46.0)
Hemoglobin: 13.4 g/dL (ref 12.0–15.0)
Immature Granulocytes: 0 %
Lymphocytes Relative: 24 %
Lymphs Abs: 1.7 10*3/uL (ref 0.7–4.0)
MCH: 30.5 pg (ref 26.0–34.0)
MCHC: 34.5 g/dL (ref 30.0–36.0)
MCV: 88.4 fL (ref 80.0–100.0)
Monocytes Absolute: 0.5 10*3/uL (ref 0.1–1.0)
Monocytes Relative: 8 %
Neutro Abs: 4.7 10*3/uL (ref 1.7–7.7)
Neutrophils Relative %: 67 %
Platelet Count: 249 10*3/uL (ref 150–400)
RBC: 4.39 MIL/uL (ref 3.87–5.11)
RDW: 12.6 % (ref 11.5–15.5)
WBC Count: 7 10*3/uL (ref 4.0–10.5)
nRBC: 0 % (ref 0.0–0.2)

## 2023-04-24 LAB — IRON AND TIBC
Iron: 62 ug/dL (ref 28–170)
Saturation Ratios: 16 % (ref 10.4–31.8)
TIBC: 381 ug/dL (ref 250–450)
UIBC: 319 ug/dL

## 2023-04-24 LAB — VITAMIN B12: Vitamin B-12: 635 pg/mL (ref 180–914)

## 2023-04-24 LAB — FERRITIN: Ferritin: 355 ng/mL — ABNORMAL HIGH (ref 11–307)

## 2023-04-24 MED ORDER — CYANOCOBALAMIN 1000 MCG/ML IJ SOLN
1000.0000 ug | Freq: Once | INTRAMUSCULAR | Status: AC
  Administered 2023-04-24: 1000 ug via INTRAMUSCULAR
  Filled 2023-04-24: qty 1

## 2023-05-06 ENCOUNTER — Other Ambulatory Visit: Payer: Self-pay | Admitting: Family Medicine

## 2023-05-06 ENCOUNTER — Other Ambulatory Visit: Payer: Self-pay

## 2023-05-06 DIAGNOSIS — M25552 Pain in left hip: Secondary | ICD-10-CM | POA: Diagnosis not present

## 2023-05-06 DIAGNOSIS — M1612 Unilateral primary osteoarthritis, left hip: Secondary | ICD-10-CM | POA: Insufficient documentation

## 2023-05-06 DIAGNOSIS — M47816 Spondylosis without myelopathy or radiculopathy, lumbar region: Secondary | ICD-10-CM | POA: Diagnosis not present

## 2023-05-07 ENCOUNTER — Other Ambulatory Visit: Payer: Self-pay

## 2023-05-07 ENCOUNTER — Other Ambulatory Visit: Payer: Self-pay | Admitting: Family Medicine

## 2023-05-08 ENCOUNTER — Other Ambulatory Visit: Payer: Self-pay

## 2023-05-08 MED FILL — Tirzepatide (Weight Mngmt) Soln Auto-Injector 12.5 MG/0.5ML: SUBCUTANEOUS | 28 days supply | Qty: 2 | Fill #0 | Status: AC

## 2023-05-24 ENCOUNTER — Inpatient Hospital Stay: Payer: BC Managed Care – PPO | Attending: Oncology

## 2023-05-27 ENCOUNTER — Other Ambulatory Visit: Payer: Self-pay | Admitting: Family Medicine

## 2023-05-27 DIAGNOSIS — F5105 Insomnia due to other mental disorder: Secondary | ICD-10-CM

## 2023-05-27 DIAGNOSIS — F321 Major depressive disorder, single episode, moderate: Secondary | ICD-10-CM

## 2023-05-27 DIAGNOSIS — J4520 Mild intermittent asthma, uncomplicated: Secondary | ICD-10-CM

## 2023-05-28 ENCOUNTER — Encounter: Payer: Self-pay | Admitting: Family Medicine

## 2023-06-10 ENCOUNTER — Encounter: Payer: Self-pay | Admitting: Family Medicine

## 2023-06-11 ENCOUNTER — Encounter: Payer: Self-pay | Admitting: Family Medicine

## 2023-06-11 ENCOUNTER — Ambulatory Visit (INDEPENDENT_AMBULATORY_CARE_PROVIDER_SITE_OTHER): Payer: BC Managed Care – PPO | Admitting: Family Medicine

## 2023-06-11 VITALS — BP 124/74 | HR 83 | Temp 97.6°F | Resp 16 | Ht 67.0 in | Wt 220.4 lb

## 2023-06-11 DIAGNOSIS — R7303 Prediabetes: Secondary | ICD-10-CM

## 2023-06-11 DIAGNOSIS — E559 Vitamin D deficiency, unspecified: Secondary | ICD-10-CM

## 2023-06-11 DIAGNOSIS — Z87440 Personal history of urinary (tract) infections: Secondary | ICD-10-CM

## 2023-06-11 DIAGNOSIS — R34 Anuria and oliguria: Secondary | ICD-10-CM

## 2023-06-11 DIAGNOSIS — K219 Gastro-esophageal reflux disease without esophagitis: Secondary | ICD-10-CM

## 2023-06-11 DIAGNOSIS — E785 Hyperlipidemia, unspecified: Secondary | ICD-10-CM

## 2023-06-11 DIAGNOSIS — R42 Dizziness and giddiness: Secondary | ICD-10-CM

## 2023-06-11 DIAGNOSIS — I10 Essential (primary) hypertension: Secondary | ICD-10-CM

## 2023-06-11 LAB — POCT URINALYSIS DIPSTICK (MANUAL)
Leukocytes, UA: NEGATIVE
Nitrite, UA: NEGATIVE
Poct Blood: NEGATIVE
Poct Glucose: NORMAL mg/dL
Poct Ketones: NEGATIVE
Poct Urobilinogen: 8 mg/dL — AB
Spec Grav, UA: 1.025 (ref 1.010–1.025)
pH, UA: 5 (ref 5.0–8.0)

## 2023-06-11 MED ORDER — VITAMIN D (ERGOCALCIFEROL) 1.25 MG (50000 UNIT) PO CAPS: 50000.0000 [IU] | ORAL_CAPSULE | ORAL | 1 refills | Status: DC

## 2023-06-11 MED ORDER — TRIAMTERENE-HCTZ 37.5-25 MG PO TABS
1.0000 | ORAL_TABLET | Freq: Every day | ORAL | 1 refills | Status: DC
Start: 2023-06-11 — End: 2023-11-13

## 2023-06-11 MED ORDER — OMEPRAZOLE 40 MG PO CPDR
40.0000 mg | DELAYED_RELEASE_CAPSULE | Freq: Every day | ORAL | 0 refills | Status: DC
Start: 2023-06-11 — End: 2023-11-13

## 2023-06-11 MED ORDER — ROSUVASTATIN CALCIUM 40 MG PO TABS
40.0000 mg | ORAL_TABLET | Freq: Every day | ORAL | 1 refills | Status: DC
Start: 2023-06-11 — End: 2023-11-13

## 2023-06-11 MED ORDER — ZEPBOUND 12.5 MG/0.5ML ~~LOC~~ SOAJ: 12.5000 mg | SUBCUTANEOUS | 2 refills | Status: DC

## 2023-06-11 NOTE — Progress Notes (Signed)
Name: Savannah Manning   MRN: [id number]    DOB: 18-Dec-1960   Date:06/11/2023       Progress Note  Subjective  Chief Complaint  UTI/Vertigo  HPI  MDD:. We gave her Lexapro rx Spring 2023 but she states she didn't like how it made her feel and stopped taking it . She could not tolerate duloxetine - it caused nausea She is taking Seroquel prn at night to sleep and we added wellbutrin 06/2022, phq 9 was 10 last visit but is went down to  5 , however just found out a couple of weeks ago that she may lose her job in Dec and has been very stressed    Morbidly Obese /prediabetes: .  She has been obese since childhood. She has tried multiple diets in the past, she had gastric bypass in 2005. Weight before surgery was 314 lbs, she went down to 164 lbs, but has been gradually gaining weight. She states her weight has gone up since she started to work from home in  2010.  She had her surgery done at [facility], and was denied for a revision in the Summer 2018.  She has failed Metformin, started her on Ozempic  04/2017 her weight was 259 lbs, her weight went as low as 239 lbs. Her insurance has denied Ozempic, only paying for patients that have DM. She states is not covering Saxenda again, we gave rx of Contrave Dec 2021 but she stopped due to cost. She gained more weight and we resumed Saxenda March 2023 her weight at the start of therapy was 278 lbs and it went  down to 264 lbs August 2023 - we added Contrave in 2023 however she we switched to wellbutrin XL she is now out of saxenda due to [employer], , on her visit in Feb 2024 her weight was 272 lbs and we started her on Zepbound 2.5 mg she is no 7.5 mg dose, she had lost 22 lbs int he past 3 months, she has noticed it curbs her appetite. We gradually titrate the dose up , currently on 12.5 mg and weight is down to 220lbs, she has lost a total of 52 lbs . She is now having to pay out of pocket and is thinking about stopping medication, she will let me know if she  wants me to send lower dose    Asthma mild intermittent: she is doing well, nocturnal cough improved, stable on singulair , advised to also take antihistamines this time of the year    HTN:  She denies chest pain or palpitation. She is on Maxzide and norvasc , bp is at goal today continue current regiment. Explained Norvasc may cause edema, however cannot take ARB/ACE or a beta blocker due to bradycardia and allergies. She was seen by cardiologist and furosemide was added for ankle edema, she takes it prn only    Dyslipidemia: LDL was 135, she is taking rosuvastatin , she is on 40 mg dose - keep follow up with Dr. Myriam Forehand .Unchanged    Vitamin D deficiency/ vitamin B12 deficiency : continue rx vitamin D, she is getting b12 shots at hematologist    Iron deficiency anemia: seen by hematologist and had some iron infusion, due to malabsorption secondary to bariatric surgery    GERD: she is taking Omeprazole given by Dr. Allegra Lai, doing well at this time   NSAID's use: she was given Meloxicam by Podiatrist and Diclofenac for left hip pain given by Ortho. Explained she should  not take NSAID's due to history of bariatric surgery. Plus both medications are toxic to her kidney and the same medication, she chose to continue Meloxicam for now and will try to wean self off.   Dizziness: she denies spinning sensation but has felt brief episodes of lightheadedness , advised to buy otc glucose monitor and check glucose , also to stay hydrated, episodes are brief. We will also check labs   History of UTI: today she came in stating that she does not feel that she is voiding enough , discussed NSAID's use and we will check GFR and urine micro   Patient Active Problem List   Diagnosis Date Noted   Lumbar spondylosis 05/06/2023   Primary osteoarthritis of left hip 05/06/2023   Morbid obesity (HCC) 03/13/2023   Moderate major depression (HCC) 05/01/2022   Intertrigo 05/01/2022   Insomnia due to mental condition  05/01/2022   Mild intermittent asthma without complication 10/20/2020   Primary osteoarthritis of both knees 10/20/2020   Primary osteoarthritis of right knee 04/06/2020   Primary osteoarthritis of left knee 04/06/2020   Pre-diabetes 12/05/2018   Malabsorption of iron 11/07/2018   Chronic pain of right knee 07/26/2017   Vitamin D deficiency 04/24/2017   B12 deficiency 04/24/2017   Abnormal mammogram of right breast 01/01/2017   Asthma, mild persistent 12/17/2016   History of shoulder surgery 10/12/2016   Primary osteoarthritis of left shoulder 06/14/2016   Incomplete tear of left rotator cuff 05/30/2016   Rotator cuff tendinitis 05/30/2016   History of bariatric surgery 05/04/2016   Allergic rhinitis 05/12/2015   Benign essential HTN 05/12/2015   Dyslipidemia 05/12/2015   Bilateral lower extremity edema 05/12/2015   Gastroesophageal reflux disease without esophagitis 05/12/2015   Migraine 05/12/2015   Plantar fasciitis 05/12/2015   Umbilical hernia without obstruction and without gangrene 05/12/2015   Morbid obesity with BMI of 40.0-44.9, adult (HCC) 02/13/2008    Past Surgical History:  Procedure Laterality Date   ABDOMINAL HYSTERECTOMY  2012   CHOLECYSTECTOMY     COLONOSCOPY WITH PROPOFOL N/A 12/18/2019   Procedure: COLONOSCOPY WITH PROPOFOL;  Surgeon: Toney Reil, MD;  Location: [facility];  Service: Gastroenterology;  Laterality: N/A;   DILATION AND CURETTAGE OF UTERUS     ESOPHAGOGASTRODUODENOSCOPY (EGD) WITH PROPOFOL N/A 03/18/2020   Procedure: ESOPHAGOGASTRODUODENOSCOPY (EGD) WITH PROPOFOL;  Surgeon: Toney Reil, MD;  Location: [facility];  Service: Gastroenterology;  Laterality: N/A;   GASTRIC BYPASS  2005   INDUCED ABORTION N/A 1997   patient was about 22 weeks   POLYPECTOMY  12/18/2019   Procedure: POLYPECTOMY;  Surgeon: Toney Reil, MD;  Location: [facility];  Service: Gastroenterology;;   SHOULDER ARTHROSCOPY WITH OPEN  ROTATOR CUFF REPAIR Left 06/14/2016   Procedure: SHOULDER ARTHROSCOPY WITH OPEN ROTATOR CUFF REPAIR, DECOMPRESSION, EXCISION LOOSE BODY, DEBRIDEMENT;  Surgeon: Christena Flake, MD;  Location: [facility];  Service: Orthopedics;  Laterality: Left;    Family History  Problem Relation Age of Onset   Hypertension Mother    Cancer Father        prostate   Heart disease Brother    Breast cancer Cousin 40       mat cousin    Social History   Tobacco Use   Smoking status: Never   Smokeless tobacco: Never  Substance Use Topics   Alcohol use: No    Alcohol/week: 0.0 standard drinks of alcohol     Current Outpatient Medications:    acetaminophen (TYLENOL) 325  MG tablet, Take 325 mg by mouth every 6 (six) hours as needed. PRN, Disp: , Rfl:    albuterol (VENTOLIN HFA) 108 (90 Base) MCG/ACT inhaler, Inhale 2 puffs into the lungs every 6 (six) hours as needed for wheezing or shortness of breath., Disp: 18 g, Rfl: 0   amLODipine (NORVASC) 2.5 MG tablet, Take 1 tablet (2.5 mg total) by mouth daily., Disp: 90 tablet, Rfl: 1   aspirin EC 81 MG tablet, Take 1 tablet (81 mg total) by mouth daily. Swallow whole., Disp: , Rfl:    buPROPion (WELLBUTRIN XL) 300 MG 24 hr tablet, TAKE 1 TABLET(300 MG) BY MOUTH DAILY, Disp: 90 tablet, Rfl: 1   cyanocobalamin (VITAMIN B12) 1000 MCG/ML injection, Inject into the muscle., Disp: , Rfl:    diclofenac (VOLTAREN) 75 MG EC tablet, Take by mouth., Disp: , Rfl:    fluticasone (FLONASE) 50 MCG/ACT nasal spray, SHAKE LIQUID AND USE 2 SPRAYS IN EACH NOSTRIL DAILY, Disp: 48 g, Rfl: 0   loratadine (CLARITIN) 10 MG tablet, Take 1 tablet (10 mg total) by mouth daily., Disp: 90 tablet, Rfl: 1   meloxicam (MOBIC) 15 MG tablet, Take 1 tablet (15 mg total) by mouth daily., Disp: 30 tablet, Rfl: 3   montelukast (SINGULAIR) 10 MG tablet, TAKE 1 TABLET(10 MG) BY MOUTH AT BEDTIME, Disp: 90 tablet, Rfl: 1   nitrofurantoin, macrocrystal-monohydrate, (MACROBID) 100 MG capsule, Take 1 capsule  (100 mg total) by mouth 2 (two) times daily., Disp: 10 capsule, Rfl: 0   nystatin cream (MYCOSTATIN), Apply 1 Application topically 2 (two) times daily., Disp: 30 g, Rfl: 5   omeprazole (PRILOSEC) 40 MG capsule, TAKE 1 CAPSULE(40 MG) BY MOUTH DAILY, Disp: 90 capsule, Rfl: 0   QUEtiapine (SEROQUEL) 25 MG tablet, TAKE 1 TABLET(25 MG) BY MOUTH AT BEDTIME, Disp: 90 tablet, Rfl: 1   rosuvastatin (CRESTOR) 40 MG tablet, Take 1 tablet (40 mg total) by mouth daily., Disp: 90 tablet, Rfl: 1   tirzepatide (ZEPBOUND) 12.5 MG/0.5ML Pen, Inject 12.5 mg into the skin once a week., Disp: 2 mL, Rfl: 0   triamterene-hydrochlorothiazide (MAXZIDE-25) 37.5-25 MG tablet, Take 1 tablet by mouth daily. In place of HCTZ 25 , BP medication, Disp: 90 tablet, Rfl: 1   Vitamin D, Ergocalciferol, (DRISDOL) 1.25 MG (50000 UNIT) CAPS capsule, Take 1 capsule (50,000 Units total) by mouth every 7 (seven) days., Disp: 12 capsule, Rfl: 1   furosemide (LASIX) 20 MG tablet, Take 1 tablet (20 mg total) by mouth daily as needed. For leg swelling, Disp: 30 tablet, Rfl: 5  Allergies  Allergen Reactions   Ace Inhibitors     angioedema   Codeine Swelling   Valsartan Swelling    I personally reviewed active problem list, medication list, allergies, family history with the patient/caregiver today.   ROS  Ten systems reviewed and is negative except as mentioned in HPI    Objective  Vitals:   06/11/23 1134  BP: 124/74  Pulse: 83  Resp: 16  Temp: 97.6 F (36.4 C)  TempSrc: Oral  SpO2: 97%  Weight: 220 lb 6.4 oz (100 kg)  Height: 5\' 7"  (1.702 m)    Body mass index is 34.52 kg/m.  Physical Exam  Constitutional: Patient appears well-developed and well-nourished. Obese  No distress.  HEENT: head atraumatic, normocephalic, pupils equal and reactive to light, neck supple Cardiovascular: Normal rate, regular rhythm and normal heart sounds.  No murmur heard. No BLE edema. Pulmonary/Chest: Effort normal and breath sounds  normal. No respiratory  distress. Abdominal: Soft.  There is no tenderness. Psychiatric: Patient has a normal mood and affect. behavior is normal. Judgment and thought content normal.   Recent Results (from the past 2160 hour(s))  POCT Urinalysis Dipstick     Status: Abnormal   Collection Time: 04/15/23  7:49 AM  Result Value Ref Range   Color, UA Yellow    Clarity, UA Clear    Glucose, UA Negative Negative   Bilirubin, UA Negative    Ketones, UA Negative    Spec Grav, UA >=1.030 (A) 1.010 - 1.025   Blood, UA Negative    pH, UA 6.0 5.0 - 8.0   Protein, UA Negative Negative   Urobilinogen, UA 0.2 0.2 or 1.0 E.U./dL   Nitrite, UA Negative    Leukocytes, UA Negative Negative   Appearance Normal    Odor None   CULTURE, URINE COMPREHENSIVE     Status: Abnormal   Collection Time: 04/15/23  8:30 AM   Specimen: Urine  Result Value Ref Range   MICRO NUMBER: [id number]    SPECIMEN QUALITY: Adequate    Source OTHER (SPECIFY)    STATUS: FINAL    RESULT: Gram positive cocci isolated (A)     Comment: 1,000-9,000 CFU/ML of Gram positive cocci isolated Gram positive bacilli isolated Gram negative bacilli isolated May represent colonizers from external and internal genitalia. No further testing (including susceptibility) will be performed.  Vitamin B12     Status: None   Collection Time: 04/24/23  3:01 PM  Result Value Ref Range   Vitamin B-12 635 180 - 914 pg/mL    Comment: (NOTE) This assay is not validated for testing neonatal or myeloproliferative syndrome specimens for Vitamin B12 levels. Performed at [facility], [address], [place], [place] [address]   Iron and TIBC([facility])     Status: None   Collection Time: 04/24/23  3:01 PM  Result Value Ref Range   Iron 62 28 - 170 ug/dL   TIBC 119 147 - 829 ug/dL   Saturation Ratios 16 10.4 - 31.8 %   UIBC 319 ug/dL    Comment: Performed at [facility], [address] [place], [place] [address]  Ferritin      Status: Abnormal   Collection Time: 04/24/23  3:01 PM  Result Value Ref Range   Ferritin 355 (H) 11 - 307 ng/mL    Comment: Performed at [facility], [address] [place], [place] [address]  CBC with Differential (Cancer Center Only)     Status: None   Collection Time: 04/24/23  3:01 PM  Result Value Ref Range   WBC Count 7.0 4.0 - 10.5 K/uL   RBC 4.39 3.87 - 5.11 MIL/uL   Hemoglobin 13.4 12.0 - 15.0 g/dL   HCT 84.6 96.2 - 95.2 %   MCV 88.4 80.0 - 100.0 fL   MCH 30.5 26.0 - 34.0 pg   MCHC 34.5 30.0 - 36.0 g/dL   RDW 84.1 32.4 - 40.1 %   Platelet Count 249 150 - 400 K/uL   nRBC 0.0 0.0 - 0.2 %   Neutrophils Relative % 67 %   Neutro Abs 4.7 1.7 - 7.7 K/uL   Lymphocytes Relative 24 %   Lymphs Abs 1.7 0.7 - 4.0 K/uL   Monocytes Relative 8 %   Monocytes Absolute 0.5 0.1 - 1.0 K/uL   Eosinophils Relative 1 %   Eosinophils Absolute 0.1 0.0 - 0.5 K/uL   Basophils Relative 0 %  Basophils Absolute 0.0 0.0 - 0.1 K/uL   Immature Granulocytes 0 %   Abs Immature Granulocytes 0.02 0.00 - 0.07 K/uL    Comment: Performed at [facility], [address] [place], [place] [address]  POCT Urinalysis Dip Manual     Status: Abnormal   Collection Time: 06/11/23 11:38 AM  Result Value Ref Range   Spec Grav, UA 1.025 1.010 - 1.025   pH, UA 5.0 5.0 - 8.0   Leukocytes, UA Negative Negative   Nitrite, UA Negative Negative   Poct Protein ++100 (A) Negative, trace mg/dL   Poct Glucose Normal Normal mg/dL   Poct Ketones Negative Negative   Poct Urobilinogen =8 (A) Normal mg/dL   Poct Bilirubin +++ (A) Negative   Poct Blood Negative Negative, trace      PHQ2/9:    06/11/2023   11:37 AM 04/15/2023    7:48 AM 03/13/2023    3:34 PM 12/11/2022    2:09 PM 10/24/2022    3:21 PM  Depression screen PHQ 2/9  Decreased Interest 0 0 0 0 1  Down, Depressed, Hopeless 0 0 0 1 0  PHQ - 2 Score 0 0 0 1 1  Altered sleeping 3 3 1 3 1   Tired, decreased energy 3 3 1 1 1   Change in appetite 0  0 1 2 1   Feeling bad or failure about yourself  0 0 0 0 0  Trouble concentrating 3 3 2 3 1   Moving slowly or fidgety/restless 0 0 0 0 0  Suicidal thoughts 0 0 0 0 0  PHQ-9 Score 9 9 5 10 5   Difficult doing work/chores Not difficult at all  Not difficult at all Not difficult at all     phq 9 is positive   Fall Risk:    06/11/2023   11:37 AM 04/15/2023    7:39 AM 03/13/2023    3:34 PM 12/11/2022    2:09 PM 10/24/2022    3:20 PM  Fall Risk   Falls in the past year? 0 0 1 0 0  Number falls in past yr:  0 0  0  Injury with Fall?  0 1  0  Risk for fall due to : No Fall Risks No Fall Risks History of fall(s) No Fall Risks No Fall Risks  Follow up Falls prevention discussed Falls prevention discussed Falls prevention discussed;Education provided;Falls evaluation completed Falls prevention discussed;Education provided;Falls evaluation completed Falls prevention discussed     Functional Status Survey: Is the patient deaf or have difficulty hearing?: No Does the patient have difficulty seeing, even when wearing glasses/contacts?: No Does the patient have difficulty concentrating, remembering, or making decisions?: No Does the patient have difficulty walking or climbing stairs?: Yes Does the patient have difficulty dressing or bathing?: No Does the patient have difficulty doing errands alone such as visiting a doctor's office or shopping?: No    Assessment & Plan  1. History of UTI  - POCT Urinalysis Dip Manual - Urine Culture  2. Decreased urine volume  - Urine Culture - Urine Microalbumin w/creat. ratio - COMPLETE METABOLIC PANEL WITH GFR  3. Dyslipidemia  - Lipid panel - rosuvastatin (CRESTOR) 40 MG tablet; Take 1 tablet (40 mg total) by mouth daily.  Dispense: 90 tablet; Refill: 1  4. Pre-diabetes  - Hemoglobin A1c  5. Gastroesophageal reflux disease without esophagitis  - omeprazole (PRILOSEC) 40 MG capsule; Take 1 capsule (40 mg total) by mouth daily.  Dispense: 90  capsule; Refill: 0  6. Benign essential HTN  - triamterene-hydrochlorothiazide (MAXZIDE-25) 37.5-25 MG tablet; Take 1 tablet by mouth daily. In place of HCTZ 25 , BP medication  Dispense: 90 tablet; Refill: 1  7. Vitamin D deficiency  - Vitamin D, Ergocalciferol, (DRISDOL) 1.25 MG (50000 UNIT) CAPS capsule; Take 1 capsule (50,000 Units total) by mouth every 7 (seven) days.  Dispense: 12 capsule; Refill: 1

## 2023-06-17 ENCOUNTER — Encounter: Payer: BC Managed Care – PPO | Admitting: Family Medicine

## 2023-06-17 NOTE — Progress Notes (Deleted)
Name: Savannah Manning   MRN: [id number]    DOB: [birth year]   Date:06/17/2023       Progress Note  Subjective  Chief Complaint  Annual Exam  HPI  Patient presents for annual CPE.  Diet: *** Exercise: ***  Last Eye Exam: *** Last Dental Exam: ***  Flowsheet Row Office Visit from 12/05/2018 in [facility]  AUDIT-C Score 0      Depression: Phq 9 is  {Desc; negative/positive:13464}    06/11/2023   11:37 AM 04/15/2023    7:48 AM 03/13/2023    3:34 PM 12/11/2022    2:09 PM 10/24/2022    3:21 PM  Depression screen PHQ 2/9  Decreased Interest 0 0 0 0 1  Down, Depressed, Hopeless 0 0 0 1 0  PHQ - 2 Score 0 0 0 1 1  Altered sleeping 3 3 1 3 1   Tired, decreased energy 3 3 1 1 1   Change in appetite 0 0 1 2 1   Feeling bad or failure about yourself  0 0 0 0 0  Trouble concentrating 3 3 2 3 1   Moving slowly or fidgety/restless 0 0 0 0 0  Suicidal thoughts 0 0 0 0 0  PHQ-9 Score 9 9 5 10 5   Difficult doing work/chores Not difficult at all  Not difficult at all Not difficult at all    Hypertension: BP Readings from Last 3 Encounters:  06/11/23 124/74  04/15/23 126/74  04/05/23 122/80   Obesity: Wt Readings from Last 3 Encounters:  06/11/23 220 lb 6.4 oz (100 kg)  04/15/23 240 lb (108.9 kg)  04/05/23 242 lb 12.8 oz (110.1 kg)   BMI Readings from Last 3 Encounters:  06/11/23 34.52 kg/m  04/15/23 37.59 kg/m  04/05/23 38.03 kg/m     Vaccines:   HPV: N/A Tdap: Due in September  Shingrix: up to date Pneumonia: N/A Flu: up to date COVID-19: up to date   Hep C Screening: 10/12/16 STD testing and prevention (HIV/chl/gon/syphilis): 06/22/04 Intimate partner violence: negative screen  Sexual History : Menstrual History/LMP/Abnormal Bleeding:  Discussed importance of follow up if any post-menopausal bleeding: yes  Incontinence Symptoms: negative for symptoms   Breast cancer:  - Last Mammogram: 04/08/23 - BRCA gene screening: N/A  Osteoporosis  Prevention : Discussed high calcium and vitamin D supplementation, weight bearing exercises Bone density: N/A  Cervical cancer screening: 11/20/20  Skin cancer: Discussed monitoring for atypical lesions  Colorectal cancer: 12/18/19   Lung cancer:  Low Dose CT Chest recommended if Age 92-80 years, 20 pack-year currently smoking OR have quit w/in 15years. Patient does not qualify for screen   ECG: 04/05/23  Advanced Care Planning: A voluntary discussion about advance care planning including the explanation and discussion of advance directives.  Discussed health care proxy and Living will, and the patient was able to identify a health care proxy as ***.  Patient does not have a living will and power of attorney of health care   Lipids: Lab Results  Component Value Date   CHOL 177 06/11/2023   CHOL 230 (H) 10/04/2022   CHOL 266 (H) 01/23/2022   Lab Results  Component Value Date   HDL 53 06/11/2023   HDL 65 10/04/2022   HDL 68 01/23/2022   Lab Results  Component Value Date   LDLCALC 103 (H) 06/11/2023   LDLCALC 135 (H) 10/04/2022   LDLCALC 171 (H) 01/23/2022   Lab Results  Component Value Date   TRIG 118  06/11/2023   TRIG 149 10/04/2022   TRIG 131 01/23/2022   Lab Results  Component Value Date   CHOLHDL 3.3 06/11/2023   CHOLHDL 3.5 10/04/2022   CHOLHDL 3.9 01/23/2022   No results found for: "LDLDIRECT"  Glucose: Glucose  Date Value Ref Range Status  10/23/2014 89 65 - 99 mg/dL Final   Glucose, Bld  Date Value Ref Range Status  06/11/2023 90 65 - 99 mg/dL Final    Comment:    .            Fasting reference interval .   01/23/2022 84 65 - 99 mg/dL Final    Comment:    .            Fasting reference interval .   12/01/2021 86 70 - 99 mg/dL Final    Comment:    Glucose reference range applies only to samples taken after fasting for at least 8 hours.    Patient Active Problem List   Diagnosis Date Noted   Lumbar spondylosis 05/06/2023   Primary  osteoarthritis of left hip 05/06/2023   Morbid obesity (HCC) 03/13/2023   Moderate major depression (HCC) 05/01/2022   Intertrigo 05/01/2022   Insomnia due to mental condition 05/01/2022   Mild intermittent asthma without complication 10/20/2020   Primary osteoarthritis of both knees 10/20/2020   Primary osteoarthritis of right knee 04/06/2020   Primary osteoarthritis of left knee 04/06/2020   Pre-diabetes 12/05/2018   Malabsorption of iron 11/07/2018   Chronic pain of right knee 07/26/2017   Vitamin D deficiency 04/24/2017   B12 deficiency 04/24/2017   Abnormal mammogram of right breast 01/01/2017   Asthma, mild persistent 12/17/2016   History of shoulder surgery 10/12/2016   Primary osteoarthritis of left shoulder 06/14/2016   Incomplete tear of left rotator cuff 05/30/2016   Rotator cuff tendinitis 05/30/2016   History of bariatric surgery 05/04/2016   Allergic rhinitis 05/12/2015   Benign essential HTN 05/12/2015   Dyslipidemia 05/12/2015   Bilateral lower extremity edema 05/12/2015   Gastroesophageal reflux disease without esophagitis 05/12/2015   Migraine 05/12/2015   Plantar fasciitis 05/12/2015   Umbilical hernia without obstruction and without gangrene 05/12/2015   Morbid obesity with BMI of 40.0-44.9, adult (HCC) 02/13/2008    Past Surgical History:  Procedure Laterality Date   ABDOMINAL HYSTERECTOMY  2012   CHOLECYSTECTOMY     COLONOSCOPY WITH PROPOFOL N/A 12/18/2019   Procedure: COLONOSCOPY WITH PROPOFOL;  Surgeon: Toney Reil, MD;  Location: [facility];  Service: Gastroenterology;  Laterality: N/A;   DILATION AND CURETTAGE OF UTERUS     ESOPHAGOGASTRODUODENOSCOPY (EGD) WITH PROPOFOL N/A 03/18/2020   Procedure: ESOPHAGOGASTRODUODENOSCOPY (EGD) WITH PROPOFOL;  Surgeon: Toney Reil, MD;  Location: [facility];  Service: Gastroenterology;  Laterality: N/A;   GASTRIC BYPASS  2005   INDUCED ABORTION N/A 1997   patient was about 22 weeks    POLYPECTOMY  12/18/2019   Procedure: POLYPECTOMY;  Surgeon: Toney Reil, MD;  Location: [facility];  Service: Gastroenterology;;   SHOULDER ARTHROSCOPY WITH OPEN ROTATOR CUFF REPAIR Left 06/14/2016   Procedure: SHOULDER ARTHROSCOPY WITH OPEN ROTATOR CUFF REPAIR, DECOMPRESSION, EXCISION LOOSE BODY, DEBRIDEMENT;  Surgeon: Christena Flake, MD;  Location: [facility];  Service: Orthopedics;  Laterality: Left;    Family History  Problem Relation Age of Onset   Hypertension Mother    Cancer Father        prostate   Heart disease Brother    Breast cancer Cousin  40       mat cousin    Social History   Socioeconomic History   Marital status: Single    Spouse name: Not on file   Number of children: 1   Years of education: Not on file   Highest education level: Not on file  Occupational History   Not on file  Tobacco Use   Smoking status: Never   Smokeless tobacco: Never  Vaping Use   Vaping status: Never Used  Substance and Sexual Activity   Alcohol use: No    Alcohol/week: 0.0 standard drinks of alcohol   Drug use: No   Sexual activity: Not Currently  Other Topics Concern   Not on file  Social History Narrative   Desk job/works for insurance; no smoking; no alcohol; lives in [place].    Social Determinants of Health   Financial Resource Strain: Low Risk  (06/12/2022)   Overall Financial Resource Strain (CARDIA)    Difficulty of Paying Living Expenses: Not hard at all  Food Insecurity: No Food Insecurity (06/12/2022)   Hunger Vital Sign    Worried About Running Out of Food in the Last Year: Never true    Ran Out of Food in the Last Year: Never true  Transportation Needs: No Transportation Needs (06/12/2022)   PRAPARE - Administrator, Civil Service (Medical): No    Lack of Transportation (Non-Medical): No  Physical Activity: Inactive (06/12/2022)   Exercise Vital Sign    Days of Exercise per Week: 0 days    Minutes of Exercise per Session: 0 min  Stress:  Stress Concern Present (06/12/2022)   [employer] of Occupational Health - Occupational Stress Questionnaire    Feeling of Stress : To some extent  Social Connections: Moderately Integrated (06/12/2022)   Social Connection and Isolation Panel [NHANES]    Frequency of Communication with Friends and Family: More than three times a week    Frequency of Social Gatherings with Friends and Family: Not on file    Attends Religious Services: More than 4 times per year    Active Member of [employer] or Organizations: Yes    Attends Banker Meetings: 1 to 4 times per year    Marital Status: Never married  Intimate Partner Violence: Not At Risk (06/12/2022)   Humiliation, Afraid, Rape, and Kick questionnaire    Fear of Current or Ex-Partner: No    Emotionally Abused: No    Physically Abused: No    Sexually Abused: No     Current Outpatient Medications:    acetaminophen (TYLENOL) 325 MG tablet, Take 325 mg by mouth every 6 (six) hours as needed. PRN, Disp: , Rfl:    albuterol (VENTOLIN HFA) 108 (90 Base) MCG/ACT inhaler, Inhale 2 puffs into the lungs every 6 (six) hours as needed for wheezing or shortness of breath., Disp: 18 g, Rfl: 0   amLODipine (NORVASC) 2.5 MG tablet, Take 1 tablet (2.5 mg total) by mouth daily., Disp: 90 tablet, Rfl: 1   aspirin EC 81 MG tablet, Take 1 tablet (81 mg total) by mouth daily. Swallow whole., Disp: , Rfl:    buPROPion (WELLBUTRIN XL) 300 MG 24 hr tablet, TAKE 1 TABLET(300 MG) BY MOUTH DAILY, Disp: 90 tablet, Rfl: 1   cyanocobalamin (VITAMIN B12) 1000 MCG/ML injection, Inject into the muscle., Disp: , Rfl:    fluticasone (FLONASE) 50 MCG/ACT nasal spray, SHAKE LIQUID AND USE 2 SPRAYS IN EACH NOSTRIL DAILY, Disp: 48 g, Rfl: 0  furosemide (LASIX) 20 MG tablet, Take 1 tablet (20 mg total) by mouth daily as needed. For leg swelling, Disp: 30 tablet, Rfl: 5   loratadine (CLARITIN) 10 MG tablet, Take 1 tablet (10 mg total) by mouth daily., Disp: 90 tablet, Rfl:  1   meloxicam (MOBIC) 15 MG tablet, Take 1 tablet (15 mg total) by mouth daily., Disp: 30 tablet, Rfl: 3   montelukast (SINGULAIR) 10 MG tablet, TAKE 1 TABLET(10 MG) BY MOUTH AT BEDTIME, Disp: 90 tablet, Rfl: 1   nystatin cream (MYCOSTATIN), Apply 1 Application topically 2 (two) times daily., Disp: 30 g, Rfl: 5   omeprazole (PRILOSEC) 40 MG capsule, Take 1 capsule (40 mg total) by mouth daily., Disp: 90 capsule, Rfl: 0   QUEtiapine (SEROQUEL) 25 MG tablet, TAKE 1 TABLET(25 MG) BY MOUTH AT BEDTIME, Disp: 90 tablet, Rfl: 1   rosuvastatin (CRESTOR) 40 MG tablet, Take 1 tablet (40 mg total) by mouth daily., Disp: 90 tablet, Rfl: 1   tirzepatide (ZEPBOUND) 12.5 MG/0.5ML Pen, Inject 12.5 mg into the skin once a week., Disp: 2 mL, Rfl: 2   triamterene-hydrochlorothiazide (MAXZIDE-25) 37.5-25 MG tablet, Take 1 tablet by mouth daily. In place of HCTZ 25 , BP medication, Disp: 90 tablet, Rfl: 1   Vitamin D, Ergocalciferol, (DRISDOL) 1.25 MG (50000 UNIT) CAPS capsule, Take 1 capsule (50,000 Units total) by mouth every 7 (seven) days., Disp: 12 capsule, Rfl: 1  Allergies  Allergen Reactions   Ace Inhibitors     angioedema   Codeine Swelling   Valsartan Swelling     ROS  ***  Objective  There were no vitals filed for this visit.  There is no height or weight on file to calculate BMI.  Physical Exam ***  Recent Results (from the past 2160 hour(s))  POCT Urinalysis Dipstick     Status: Abnormal   Collection Time: 04/15/23  7:49 AM  Result Value Ref Range   Color, UA Yellow    Clarity, UA Clear    Glucose, UA Negative Negative   Bilirubin, UA Negative    Ketones, UA Negative    Spec Grav, UA >=1.030 (A) 1.010 - 1.025   Blood, UA Negative    pH, UA 6.0 5.0 - 8.0   Protein, UA Negative Negative   Urobilinogen, UA 0.2 0.2 or 1.0 E.U./dL   Nitrite, UA Negative    Leukocytes, UA Negative Negative   Appearance Normal    Odor None   CULTURE, URINE COMPREHENSIVE     Status: Abnormal    Collection Time: 04/15/23  8:30 AM   Specimen: Urine  Result Value Ref Range   MICRO NUMBER: [id number]    SPECIMEN QUALITY: Adequate    Source OTHER (SPECIFY)    STATUS: FINAL    RESULT: Gram positive cocci isolated (A)     Comment: 1,000-9,000 CFU/ML of Gram positive cocci isolated Gram positive bacilli isolated Gram negative bacilli isolated May represent colonizers from external and internal genitalia. No further testing (including susceptibility) will be performed.  Vitamin B12     Status: None   Collection Time: 04/24/23  3:01 PM  Result Value Ref Range   Vitamin B-12 635 180 - 914 pg/mL    Comment: (NOTE) This assay is not validated for testing neonatal or myeloproliferative syndrome specimens for Vitamin B12 levels. Performed at [facility], [address], [place], [place] [address]   Iron and TIBC([facility])     Status: None   Collection Time: 04/24/23  3:01 PM  Result Value Ref Range   Iron 62 28 - 170 ug/dL   TIBC 161 096 - 045 ug/dL   Saturation Ratios 16 10.4 - 31.8 %   UIBC 319 ug/dL    Comment: Performed at [facility], [address] [place], [place] [address]  Ferritin     Status: Abnormal   Collection Time: 04/24/23  3:01 PM  Result Value Ref Range   Ferritin 355 (H) 11 - 307 ng/mL    Comment: Performed at [facility], [address] [place], [place] [address]  CBC with Differential (Cancer Center Only)     Status: None   Collection Time: 04/24/23  3:01 PM  Result Value Ref Range   WBC Count 7.0 4.0 - 10.5 K/uL   RBC 4.39 3.87 - 5.11 MIL/uL   Hemoglobin 13.4 12.0 - 15.0 g/dL   HCT 82.9 56.2 - 13.0 %   MCV 88.4 80.0 - 100.0 fL   MCH 30.5 26.0 - 34.0 pg   MCHC 34.5 30.0 - 36.0 g/dL   RDW 86.5 78.4 - 69.6 %   Platelet Count 249 150 - 400 K/uL   nRBC 0.0 0.0 - 0.2 %   Neutrophils Relative % 67 %   Neutro Abs 4.7 1.7 - 7.7 K/uL   Lymphocytes Relative 24 %   Lymphs Abs 1.7 0.7 - 4.0 K/uL   Monocytes Relative 8 %    Monocytes Absolute 0.5 0.1 - 1.0 K/uL   Eosinophils Relative 1 %   Eosinophils Absolute 0.1 0.0 - 0.5 K/uL   Basophils Relative 0 %   Basophils Absolute 0.0 0.0 - 0.1 K/uL   Immature Granulocytes 0 %   Abs Immature Granulocytes 0.02 0.00 - 0.07 K/uL    Comment: Performed at [facility], [address] [place], [place] [address]  POCT Urinalysis Dip Manual     Status: Abnormal   Collection Time: 06/11/23 11:38 AM  Result Value Ref Range   Spec Grav, UA 1.025 1.010 - 1.025   pH, UA 5.0 5.0 - 8.0   Leukocytes, UA Negative Negative   Nitrite, UA Negative Negative   Poct Protein ++100 (A) Negative, trace mg/dL   Poct Glucose Normal Normal mg/dL   Poct Ketones Negative Negative   Poct Urobilinogen =8 (A) Normal mg/dL   Poct Bilirubin +++ (A) Negative   Poct Blood Negative Negative, trace  Urine Culture     Status: None   Collection Time: 06/11/23 12:01 PM   Specimen: Urine  Result Value Ref Range   MICRO NUMBER: [id number]    SPECIMEN QUALITY: Adequate    Sample Source URINE    STATUS: FINAL    Result:      Less than 10,000 CFU/mL of single Gram positive organism isolated. No further testing will be performed. If clinically indicated, recollection using a method to minimize contamination, with prompt transfer to Urine Culture Transport Tube, is recommended.  Urine Microalbumin w/creat. ratio     Status: Abnormal   Collection Time: 06/11/23 12:01 PM  Result Value Ref Range   Creatinine, Urine 637 (H) 20 - 275 mg/dL    Comment: Verified by repeat analysis. .    Microalb, Ur 2.8 mg/dL    Comment: Reference Range Not established    Microalb Creat Ratio 4 <30 mg/g creat    Comment: . The ADA defines abnormalities in albumin excretion as follows: [name] Albuminuria Category        Result (mg/g creatinine) . Normal to Mildly increased   <  30 Moderately increased         30-299  Severely increased           > OR = 300 . The ADA recommends that at least two of three specimens  collected within a 3-6 month period be abnormal before considering a patient to be within a diagnostic category.   COMPLETE METABOLIC PANEL WITH GFR     Status: None   Collection Time: 06/11/23 12:01 PM  Result Value Ref Range   Glucose, Bld 90 65 - 99 mg/dL    Comment: .            Fasting reference interval .    BUN 16 7 - 25 mg/dL   Creat 4.09 8.11 - 9.14 mg/dL   eGFR 78 > OR = 60 NW/GNF/6.21H0   BUN/Creatinine Ratio SEE NOTE: 6 - 22 (calc)    Comment:    Not Reported: BUN and Creatinine are within    reference range. .    Sodium 140 135 - 146 mmol/L   Potassium 4.0 3.5 - 5.3 mmol/L   Chloride 104 98 - 110 mmol/L   CO2 27 20 - 32 mmol/L   Calcium 9.3 8.6 - 10.4 mg/dL   Total Protein 6.3 6.1 - 8.1 g/dL   Albumin 4.1 3.6 - 5.1 g/dL   Globulin 2.2 1.9 - 3.7 g/dL (calc)   AG Ratio 1.9 1.0 - 2.5 (calc)   Total Bilirubin 0.9 0.2 - 1.2 mg/dL   Alkaline phosphatase (APISO) 81 37 - 153 U/L   AST 10 10 - 35 U/L   ALT 7 6 - 29 U/L  Lipid panel     Status: Abnormal   Collection Time: 06/11/23 12:01 PM  Result Value Ref Range   Cholesterol 177 <200 mg/dL   HDL 53 > OR = 50 mg/dL   Triglycerides 865 <784 mg/dL   LDL Cholesterol (Calc) 103 (H) mg/dL (calc)    Comment: Reference range: <100 . Desirable range <100 mg/dL for primary prevention;   <70 mg/dL for patients with CHD or diabetic patients  with > or = 2 CHD risk factors. [name] LDL-C is now calculated using the Martin-Hopkins  calculation, which is a validated novel method providing  better accuracy than the Friedewald equation in the  estimation of LDL-C.  Horald Pollen et al. Lenox Ahr. 6962;952(84): 2061-2068  (http://education.[facility].com/faq/FAQ164)    Total CHOL/HDL Ratio 3.3 <5.0 (calc)   Non-HDL Cholesterol (Calc) 124 <130 mg/dL (calc)    Comment: For patients with diabetes plus 1 major ASCVD risk  factor, treating to a non-HDL-C goal of <100 mg/dL  (LDL-C of <13 mg/dL) is considered a therapeutic  option.       Fall Risk:    06/11/2023   11:37 AM 04/15/2023    7:39 AM 03/13/2023    3:34 PM 12/11/2022    2:09 PM 10/24/2022    3:20 PM  Fall Risk   Falls in the past year? 0 0 1 0 0  Number falls in past yr:  0 0  0  Injury with Fall?  0 1  0  Risk for fall due to : No Fall Risks No Fall Risks History of fall(s) No Fall Risks No Fall Risks  Follow up Falls prevention discussed Falls prevention discussed Falls prevention discussed;Education provided;Falls evaluation completed Falls prevention discussed;Education provided;Falls evaluation completed Falls prevention discussed     Functional Status Survey:     Assessment & Plan  1. Well adult exam ***   -  USPSTF grade A and B recommendations reviewed with patient; age-appropriate recommendations, preventive care, screening tests, etc discussed and encouraged; healthy living encouraged; see AVS for patient education given to patient -Discussed importance of 150 minutes of physical activity weekly, eat two servings of fish weekly, eat one serving of tree nuts ( cashews, pistachios, pecans, almonds.[name]) every other day, eat 6 servings of fruit/vegetables daily and drink plenty of water and avoid sweet beverages.   -Reviewed Health Maintenance: Yes.

## 2023-06-18 ENCOUNTER — Encounter: Payer: Self-pay | Admitting: Family Medicine

## 2023-06-18 ENCOUNTER — Encounter: Payer: BC Managed Care – PPO | Admitting: Family Medicine

## 2023-06-18 DIAGNOSIS — Z0000 Encounter for general adult medical examination without abnormal findings: Secondary | ICD-10-CM

## 2023-06-19 ENCOUNTER — Ambulatory Visit: Payer: BC Managed Care – PPO | Admitting: Family Medicine

## 2023-06-24 ENCOUNTER — Inpatient Hospital Stay: Payer: BC Managed Care – PPO | Attending: Oncology

## 2023-06-24 DIAGNOSIS — D508 Other iron deficiency anemias: Secondary | ICD-10-CM | POA: Diagnosis not present

## 2023-06-24 DIAGNOSIS — E538 Deficiency of other specified B group vitamins: Secondary | ICD-10-CM | POA: Diagnosis not present

## 2023-06-24 MED ORDER — CYANOCOBALAMIN 1000 MCG/ML IJ SOLN
1000.0000 ug | Freq: Once | INTRAMUSCULAR | Status: AC
  Administered 2023-06-24: 1000 ug via INTRAMUSCULAR
  Filled 2023-06-24: qty 1

## 2023-07-11 ENCOUNTER — Encounter: Payer: Self-pay | Admitting: Family Medicine

## 2023-07-11 ENCOUNTER — Other Ambulatory Visit: Payer: Self-pay

## 2023-07-11 MED ORDER — ZEPBOUND 12.5 MG/0.5ML ~~LOC~~ SOAJ
12.5000 mg | SUBCUTANEOUS | 0 refills | Status: DC
  Filled 2023-07-11: qty 2, 28d supply, fill #0

## 2023-07-12 ENCOUNTER — Other Ambulatory Visit: Payer: Self-pay

## 2023-07-22 DIAGNOSIS — M1612 Unilateral primary osteoarthritis, left hip: Secondary | ICD-10-CM | POA: Diagnosis not present

## 2023-07-22 DIAGNOSIS — M47816 Spondylosis without myelopathy or radiculopathy, lumbar region: Secondary | ICD-10-CM | POA: Diagnosis not present

## 2023-07-26 ENCOUNTER — Inpatient Hospital Stay: Payer: BC Managed Care – PPO

## 2023-07-26 ENCOUNTER — Encounter: Payer: Self-pay | Admitting: Oncology

## 2023-07-26 ENCOUNTER — Other Ambulatory Visit: Payer: BC Managed Care – PPO

## 2023-07-26 ENCOUNTER — Inpatient Hospital Stay (HOSPITAL_BASED_OUTPATIENT_CLINIC_OR_DEPARTMENT_OTHER): Payer: BC Managed Care – PPO | Admitting: Oncology

## 2023-07-26 ENCOUNTER — Inpatient Hospital Stay: Payer: BC Managed Care – PPO | Attending: Oncology

## 2023-07-26 ENCOUNTER — Ambulatory Visit: Payer: BC Managed Care – PPO | Admitting: Oncology

## 2023-07-26 VITALS — BP 105/76 | HR 72 | Temp 98.7°F | Resp 20 | Wt 215.6 lb

## 2023-07-26 DIAGNOSIS — D508 Other iron deficiency anemias: Secondary | ICD-10-CM | POA: Diagnosis not present

## 2023-07-26 DIAGNOSIS — D509 Iron deficiency anemia, unspecified: Secondary | ICD-10-CM

## 2023-07-26 DIAGNOSIS — E538 Deficiency of other specified B group vitamins: Secondary | ICD-10-CM

## 2023-07-26 LAB — CBC WITH DIFFERENTIAL (CANCER CENTER ONLY)
Abs Immature Granulocytes: 0.01 10*3/uL (ref 0.00–0.07)
Basophils Absolute: 0 10*3/uL (ref 0.0–0.1)
Basophils Relative: 1 %
Eosinophils Absolute: 0.1 10*3/uL (ref 0.0–0.5)
Eosinophils Relative: 1 %
HCT: 38.4 % (ref 36.0–46.0)
Hemoglobin: 13.2 g/dL (ref 12.0–15.0)
Immature Granulocytes: 0 %
Lymphocytes Relative: 22 %
Lymphs Abs: 1.4 10*3/uL (ref 0.7–4.0)
MCH: 30.8 pg (ref 26.0–34.0)
MCHC: 34.4 g/dL (ref 30.0–36.0)
MCV: 89.5 fL (ref 80.0–100.0)
Monocytes Absolute: 0.4 10*3/uL (ref 0.1–1.0)
Monocytes Relative: 7 %
Neutro Abs: 4.3 10*3/uL (ref 1.7–7.7)
Neutrophils Relative %: 69 %
Platelet Count: 255 10*3/uL (ref 150–400)
RBC: 4.29 MIL/uL (ref 3.87–5.11)
RDW: 12.8 % (ref 11.5–15.5)
WBC Count: 6.2 10*3/uL (ref 4.0–10.5)
nRBC: 0 % (ref 0.0–0.2)

## 2023-07-26 LAB — IRON AND TIBC
Iron: 96 ug/dL (ref 28–170)
Saturation Ratios: 25 % (ref 10.4–31.8)
TIBC: 381 ug/dL (ref 250–450)
UIBC: 285 ug/dL

## 2023-07-26 LAB — FERRITIN: Ferritin: 244 ng/mL (ref 11–307)

## 2023-07-26 MED ORDER — CYANOCOBALAMIN 1000 MCG/ML IJ SOLN
1000.0000 ug | Freq: Once | INTRAMUSCULAR | Status: AC
  Administered 2023-07-26: 1000 ug via INTRAMUSCULAR
  Filled 2023-07-26: qty 1

## 2023-07-27 ENCOUNTER — Encounter: Payer: Self-pay | Admitting: Oncology

## 2023-07-27 NOTE — Progress Notes (Signed)
Hematology/Oncology Consult note [facility]  [phone] [phone]  Patient Care Team: Alba Cory, MD as PCP - General (Family Medicine) Debbe Odea, MD as PCP - Cardiology (Cardiology) Tresea Mall, CNM as Midwife (Obstetrics) Creig Hines, MD as Medical Oncologist (Hematology and Oncology)   Name of the patient: Savannah Manning  [id number]  1961-11-01   Date of visit: 07/27/23  Diagnosis- history of gastric bypass and iron and B12 deficiency anemia   Chief complaint/ Reason for visit- routine f/u of iron deficiency anemia  Heme/Onc history:  Patient is a 62 year old female with history of gastric bypass surgery in 2005 and subsequent iron and B12 deficiency anemia.  Hysterectomy in 2012.  Last colonoscopy in February 2021.  EGD in May 2021 which showed Roux-en-Y gastrojejunostomy with GJ anastomosis characterized by healthy-appearing mucosa.  She is currently on monthly B12 injections   Interval history- she is doing well. Denies any blood loss in stool or urine. Denies any dark melanotic stools  ECOG PS- 1 Pain scale- 0   Review of systems- Review of Systems  Constitutional:  Negative for chills, fever, malaise/fatigue and weight loss.  HENT:  Negative for congestion, ear discharge and nosebleeds.   Eyes:  Negative for blurred vision.  Respiratory:  Negative for cough, hemoptysis, sputum production, shortness of breath and wheezing.   Cardiovascular:  Negative for chest pain, palpitations, orthopnea and claudication.  Gastrointestinal:  Negative for abdominal pain, blood in stool, constipation, diarrhea, heartburn, melena, nausea and vomiting.  Genitourinary:  Negative for dysuria, flank pain, frequency, hematuria and urgency.  Musculoskeletal:  Negative for back pain, joint pain and myalgias.  Skin:  Negative for rash.  Neurological:  Negative for dizziness, tingling, focal weakness, seizures, weakness and  headaches.  Endo/Heme/Allergies:  Does not bruise/bleed easily.  Psychiatric/Behavioral:  Negative for depression and suicidal ideas. The patient does not have insomnia.       Allergies  Allergen Reactions   Ace Inhibitors     angioedema   Codeine Swelling   Valsartan Swelling     Past Medical History:  Diagnosis Date   Anemia    Arthritis    Asthma    GERD (gastroesophageal reflux disease)    Headache    Hypertension    Iron deficiency anemia 12/05/2018   Wears contact lenses      Past Surgical History:  Procedure Laterality Date   ABDOMINAL HYSTERECTOMY  2012   CHOLECYSTECTOMY     COLONOSCOPY WITH PROPOFOL N/A 12/18/2019   Procedure: COLONOSCOPY WITH PROPOFOL;  Surgeon: Toney Reil, MD;  Location: [facility];  Service: Gastroenterology;  Laterality: N/A;   DILATION AND CURETTAGE OF UTERUS     ESOPHAGOGASTRODUODENOSCOPY (EGD) WITH PROPOFOL N/A 03/18/2020   Procedure: ESOPHAGOGASTRODUODENOSCOPY (EGD) WITH PROPOFOL;  Surgeon: Toney Reil, MD;  Location: [facility];  Service: Gastroenterology;  Laterality: N/A;   GASTRIC BYPASS  2005   INDUCED ABORTION N/A 1997   patient was about 22 weeks   POLYPECTOMY  12/18/2019   Procedure: POLYPECTOMY;  Surgeon: Toney Reil, MD;  Location: [facility];  Service: Gastroenterology;;   SHOULDER ARTHROSCOPY WITH OPEN ROTATOR CUFF REPAIR Left 06/14/2016   Procedure: SHOULDER ARTHROSCOPY WITH OPEN ROTATOR CUFF REPAIR, DECOMPRESSION, EXCISION LOOSE BODY, DEBRIDEMENT;  Surgeon: Christena Flake, MD;  Location: [facility];  Service: Orthopedics;  Laterality: Left;    Social History   Socioeconomic History   Marital status: Single    Spouse name: Not on file  Number of children: 1   Years of education: Not on file   Highest education level: Not on file  Occupational History   Not on file  Tobacco Use   Smoking status: Never   Smokeless tobacco: Never  Vaping Use   Vaping status: Never Used   Substance and Sexual Activity   Alcohol use: No    Alcohol/week: 0.0 standard drinks of alcohol   Drug use: No   Sexual activity: Not Currently  Other Topics Concern   Not on file  Social History Narrative   Desk job/works for insurance; no smoking; no alcohol; lives in [place].    Social Determinants of Health   Financial Resource Strain: Low Risk  (06/12/2022)   Overall Financial Resource Strain (CARDIA)    Difficulty of Paying Living Expenses: Not hard at all  Food Insecurity: No Food Insecurity (06/12/2022)   Hunger Vital Sign    Worried About Running Out of Food in the Last Year: Never true    Ran Out of Food in the Last Year: Never true  Transportation Needs: No Transportation Needs (06/12/2022)   PRAPARE - Administrator, Civil Service (Medical): No    Lack of Transportation (Non-Medical): No  Physical Activity: Inactive (06/12/2022)   Exercise Vital Sign    Days of Exercise per Week: 0 days    Minutes of Exercise per Session: 0 min  Stress: Stress Concern Present (06/12/2022)   [employer] of Occupational Health - Occupational Stress Questionnaire    Feeling of Stress : To some extent  Social Connections: Moderately Integrated (06/12/2022)   Social Connection and Isolation Panel [NHANES]    Frequency of Communication with Friends and Family: More than three times a week    Frequency of Social Gatherings with Friends and Family: Not on file    Attends Religious Services: More than 4 times per year    Active Member of [employer] or Organizations: Yes    Attends Banker Meetings: 1 to 4 times per year    Marital Status: Never married  Intimate Partner Violence: Not At Risk (06/12/2022)   Humiliation, Afraid, Rape, and Kick questionnaire    Fear of Current or Ex-Partner: No    Emotionally Abused: No    Physically Abused: No    Sexually Abused: No    Family History  Problem Relation Age of Onset   Hypertension Mother    Cancer Father        prostate    Heart disease Brother    Breast cancer Cousin 40       mat cousin     Current Outpatient Medications:    acetaminophen (TYLENOL) 325 MG tablet, Take 325 mg by mouth every 6 (six) hours as needed. PRN, Disp: , Rfl:    albuterol (VENTOLIN HFA) 108 (90 Base) MCG/ACT inhaler, Inhale 2 puffs into the lungs every 6 (six) hours as needed for wheezing or shortness of breath., Disp: 18 g, Rfl: 0   amLODipine (NORVASC) 2.5 MG tablet, Take 1 tablet (2.5 mg total) by mouth daily., Disp: 90 tablet, Rfl: 1   aspirin EC 81 MG tablet, Take 1 tablet (81 mg total) by mouth daily. Swallow whole., Disp: , Rfl:    buPROPion (WELLBUTRIN XL) 300 MG 24 hr tablet, TAKE 1 TABLET(300 MG) BY MOUTH DAILY, Disp: 90 tablet, Rfl: 1   cyanocobalamin (VITAMIN B12) 1000 MCG/ML injection, Inject into the muscle., Disp: , Rfl:    fluticasone (FLONASE) 50 MCG/ACT nasal spray, SHAKE  LIQUID AND USE 2 SPRAYS IN EACH NOSTRIL DAILY, Disp: 48 g, Rfl: 0   furosemide (LASIX) 20 MG tablet, Take 1 tablet (20 mg total) by mouth daily as needed. For leg swelling, Disp: 30 tablet, Rfl: 5   loratadine (CLARITIN) 10 MG tablet, Take 1 tablet (10 mg total) by mouth daily., Disp: 90 tablet, Rfl: 1   meloxicam (MOBIC) 15 MG tablet, Take 1 tablet (15 mg total) by mouth daily., Disp: 30 tablet, Rfl: 3   montelukast (SINGULAIR) 10 MG tablet, TAKE 1 TABLET(10 MG) BY MOUTH AT BEDTIME, Disp: 90 tablet, Rfl: 1   nystatin cream (MYCOSTATIN), Apply 1 Application topically 2 (two) times daily., Disp: 30 g, Rfl: 5   omeprazole (PRILOSEC) 40 MG capsule, Take 1 capsule (40 mg total) by mouth daily., Disp: 90 capsule, Rfl: 0   QUEtiapine (SEROQUEL) 25 MG tablet, TAKE 1 TABLET(25 MG) BY MOUTH AT BEDTIME, Disp: 90 tablet, Rfl: 1   rosuvastatin (CRESTOR) 40 MG tablet, Take 1 tablet (40 mg total) by mouth daily., Disp: 90 tablet, Rfl: 1   tirzepatide (ZEPBOUND) 12.5 MG/0.5ML Pen, Inject 12.5 mg into the skin once a week., Disp: 2 mL, Rfl: 0    triamterene-hydrochlorothiazide (MAXZIDE-25) 37.5-25 MG tablet, Take 1 tablet by mouth daily. In place of HCTZ 25 , BP medication, Disp: 90 tablet, Rfl: 1   Vitamin D, Ergocalciferol, (DRISDOL) 1.25 MG (50000 UNIT) CAPS capsule, Take 1 capsule (50,000 Units total) by mouth every 7 (seven) days., Disp: 12 capsule, Rfl: 1  Physical exam:  Vitals:   07/26/23 1453  BP: 105/76  Pulse: 72  Resp: 20  Temp: 98.7 F (37.1 C)  SpO2: 100%  Weight: 215 lb 9.6 oz (97.8 kg)   Physical Exam Cardiovascular:     Rate and Rhythm: Normal rate and regular rhythm.     Heart sounds: Normal heart sounds.  Pulmonary:     Effort: Pulmonary effort is normal.     Breath sounds: Normal breath sounds.  Skin:    General: Skin is warm and dry.  Neurological:     Mental Status: She is alert and oriented to person, place, and time.         Latest Ref Rng & Units 06/11/2023   12:01 PM  CMP  Glucose 65 - 99 mg/dL 90   BUN 7 - 25 mg/dL 16   Creatinine 5.62 - 1.05 mg/dL 1.30   Sodium 865 - 784 mmol/L 140   Potassium 3.5 - 5.3 mmol/L 4.0   Chloride 98 - 110 mmol/L 104   CO2 20 - 32 mmol/L 27   Calcium 8.6 - 10.4 mg/dL 9.3   Total Protein 6.1 - 8.1 g/dL 6.3   Total Bilirubin 0.2 - 1.2 mg/dL 0.9   AST 10 - 35 U/L 10   ALT 6 - 29 U/L 7       Latest Ref Rng & Units 07/26/2023    2:40 PM  CBC  WBC 4.0 - 10.5 K/uL 6.2   Hemoglobin 12.0 - 15.0 g/dL 69.6   Hematocrit 29.5 - 46.0 % 38.4   Platelets 150 - 400 K/uL 255     Assessment and plan- Patient is a 62 y.o. female here for routine f/u of iron deficiency anemia  Patient is not anemic presently with an H&H of 13.2/38.4.  Ferritin levels are normal at 244 with an iron saturation of 25%.  She does not require any IV iron at this time.  She will receive a B12 injection today  but she will proceed with oral B12 1000 mcg daily over-the-counter.  We will repeat B12 levels along with CBC ferritin and iron studies in 4 and 8 months and I will see her back in 8  months   Visit Diagnosis 1. Iron deficiency anemia, unspecified iron deficiency anemia type   2. B12 deficiency      Dr. Owens Shark, MD, MPH [facility] at [facility] [phone] 07/27/2023 5:59 PM

## 2023-09-12 NOTE — Progress Notes (Signed)
Name: Savannah Manning   MRN: [id number]    DOB: 12-03-1960   Date:09/13/2023       Progress Note  Subjective  Chief Complaint  Annual Exam  HPI  Patient presents for annual CPE.  Diet: being more mindful and decreasing portions, ran out of zepbound due to insurance but will be covered again in January  Exercise: she has not been physically active due to hip pain   Last Eye Exam: up to date  Last Dental Exam: up to date   [employer] Visit from 09/13/2023 in [facility]  AUDIT-C Score 0      Depression: Phq 9 is  negative    09/13/2023   10:28 AM 06/11/2023   11:37 AM 04/15/2023    7:48 AM 03/13/2023    3:34 PM 12/11/2022    2:09 PM  Depression screen PHQ 2/9  Decreased Interest 0 0 0 0 0  Down, Depressed, Hopeless 0 0 0 0 1  PHQ - 2 Score 0 0 0 0 1  Altered sleeping 1 3 3 1 3   Tired, decreased energy 1 3 3 1 1   Change in appetite 0 0 0 1 2  Feeling bad or failure about yourself  0 0 0 0 0  Trouble concentrating 1 3 3 2 3   Moving slowly or fidgety/restless 0 0 0 0 0  Suicidal thoughts 0 0 0 0 0  PHQ-9 Score 3 9 9 5 10   Difficult doing work/chores Not difficult at all Not difficult at all  Not difficult at all Not difficult at all   Hypertension: BP Readings from Last 3 Encounters:  09/13/23 112/74  07/26/23 105/76  06/11/23 124/74   Obesity: Wt Readings from Last 3 Encounters:  09/13/23 221 lb 4.8 oz (100.4 kg)  07/26/23 215 lb 9.6 oz (97.8 kg)  06/11/23 220 lb 6.4 oz (100 kg)   BMI Readings from Last 3 Encounters:  09/13/23 34.15 kg/m  07/26/23 33.77 kg/m  06/11/23 34.52 kg/m     Vaccines:   HPV: N/A Tdap: due but states she wants to wait until next week  Shingrix: up to date Pneumonia: N/A Flu: due and will return on Monday  COVID-19: up to date   Hep C Screening: 10/12/16 STD testing and prevention (HIV/chl/gon/syphilis): 06/22/04 Intimate partner violence: negative screen  Sexual History : not sexually active in  over 2 years  Menstrual History/LMP/Abnormal Bleeding: s/p supra cervical hysterectomy  Discussed importance of follow up if any post-menopausal bleeding: yes  Incontinence Symptoms: negative for symptoms   Breast cancer:  - Last Mammogram: 04/08/23 - BRCA gene screening: N/A  Osteoporosis Prevention : Discussed high calcium and vitamin D supplementation, weight bearing exercises Bone density: N/A  Cervical cancer screening: 12/21/20  Skin cancer: Discussed monitoring for atypical lesions  Colorectal cancer: 12/18/19  repeat in 10 years  Lung cancer:  Low Dose CT Chest recommended if Age 60-80 years, 20 pack-year currently smoking OR have quit w/in 15years. Patient does not qualify for screen   ECG: 04/05/23  Advanced Care Planning: A voluntary discussion about advance care planning including the explanation and discussion of advance directives.  Discussed health care proxy and Living will, and the patient was able to identify a health care proxy as mother .  Patient does not have a living will and power of attorney of health care   Lipids: Lab Results  Component Value Date   CHOL 177 06/11/2023   CHOL 230 (H) 10/04/2022  CHOL 266 (H) 01/23/2022   Lab Results  Component Value Date   HDL 53 06/11/2023   HDL 65 10/04/2022   HDL 68 01/23/2022   Lab Results  Component Value Date   LDLCALC 103 (H) 06/11/2023   LDLCALC 135 (H) 10/04/2022   LDLCALC 171 (H) 01/23/2022   Lab Results  Component Value Date   TRIG 118 06/11/2023   TRIG 149 10/04/2022   TRIG 131 01/23/2022   Lab Results  Component Value Date   CHOLHDL 3.3 06/11/2023   CHOLHDL 3.5 10/04/2022   CHOLHDL 3.9 01/23/2022   No results found for: "LDLDIRECT"  Glucose: Glucose  Date Value Ref Range Status  10/23/2014 89 65 - 99 mg/dL Final   Glucose, Bld  Date Value Ref Range Status  06/11/2023 90 65 - 99 mg/dL Final    Comment:    .            Fasting reference interval .   01/23/2022 84 65 - 99 mg/dL Final     Comment:    .            Fasting reference interval .   12/01/2021 86 70 - 99 mg/dL Final    Comment:    Glucose reference range applies only to samples taken after fasting for at least 8 hours.    Patient Active Problem List   Diagnosis Date Noted   Lumbar spondylosis 05/06/2023   Primary osteoarthritis of left hip 05/06/2023   Morbid obesity (HCC) 03/13/2023   Moderate major depression (HCC) 05/01/2022   Intertrigo 05/01/2022   Insomnia due to mental condition 05/01/2022   Mild intermittent asthma without complication 10/20/2020   Primary osteoarthritis of both knees 10/20/2020   Primary osteoarthritis of right knee 04/06/2020   Primary osteoarthritis of left knee 04/06/2020   Pre-diabetes 12/05/2018   Malabsorption of iron 11/07/2018   Chronic pain of right knee 07/26/2017   Vitamin D deficiency 04/24/2017   B12 deficiency 04/24/2017   Abnormal mammogram of right breast 01/01/2017   Asthma, mild persistent 12/17/2016   History of shoulder surgery 10/12/2016   Primary osteoarthritis of left shoulder 06/14/2016   Incomplete tear of left rotator cuff 05/30/2016   Rotator cuff tendinitis 05/30/2016   History of bariatric surgery 05/04/2016   Allergic rhinitis 05/12/2015   Benign essential HTN 05/12/2015   Dyslipidemia 05/12/2015   Bilateral lower extremity edema 05/12/2015   Gastroesophageal reflux disease without esophagitis 05/12/2015   Migraine 05/12/2015   Plantar fasciitis 05/12/2015   Umbilical hernia without obstruction and without gangrene 05/12/2015   Morbid obesity with BMI of 40.0-44.9, adult (HCC) 02/13/2008    Past Surgical History:  Procedure Laterality Date   ABDOMINAL HYSTERECTOMY  2012   supra cervical   CHOLECYSTECTOMY     COLONOSCOPY WITH PROPOFOL N/A 12/18/2019   Procedure: COLONOSCOPY WITH PROPOFOL;  Surgeon: Toney Reil, MD;  Location: [facility];  Service: Gastroenterology;  Laterality: N/A;   DILATION AND CURETTAGE OF  UTERUS     ESOPHAGOGASTRODUODENOSCOPY (EGD) WITH PROPOFOL N/A 03/18/2020   Procedure: ESOPHAGOGASTRODUODENOSCOPY (EGD) WITH PROPOFOL;  Surgeon: Toney Reil, MD;  Location: [facility];  Service: Gastroenterology;  Laterality: N/A;   GASTRIC BYPASS  2005   INDUCED ABORTION N/A 1997   patient was about 22 weeks   POLYPECTOMY  12/18/2019   Procedure: POLYPECTOMY;  Surgeon: Toney Reil, MD;  Location: [facility];  Service: Gastroenterology;;   SHOULDER ARTHROSCOPY WITH OPEN ROTATOR CUFF REPAIR Left 06/14/2016  Procedure: SHOULDER ARTHROSCOPY WITH OPEN ROTATOR CUFF REPAIR, DECOMPRESSION, EXCISION LOOSE BODY, DEBRIDEMENT;  Surgeon: Christena Flake, MD;  Location: [facility];  Service: Orthopedics;  Laterality: Left;    Family History  Problem Relation Age of Onset   Hypertension Mother    Cancer Father        prostate   Heart disease Brother    Breast cancer Cousin 40       mat cousin    Social History   Socioeconomic History   Marital status: Single    Spouse name: Not on file   Number of children: 1   Years of education: Not on file   Highest education level: Bachelor's degree (e.g., BA, AB, BS)  Occupational History   Not on file  Tobacco Use   Smoking status: Never   Smokeless tobacco: Never  Vaping Use   Vaping status: Never Used  Substance and Sexual Activity   Alcohol use: No    Alcohol/week: 0.0 standard drinks of alcohol   Drug use: No   Sexual activity: Not Currently  Other Topics Concern   Not on file  Social History Narrative   Desk job/works for insurance; no smoking; no alcohol; lives in [place].    Social Determinants of Health   Financial Resource Strain: Low Risk  (09/12/2023)   Overall Financial Resource Strain (CARDIA)    Difficulty of Paying Living Expenses: Not very hard  Food Insecurity: No Food Insecurity (09/12/2023)   Hunger Vital Sign    Worried About Running Out of Food in the Last Year: Never true    Ran Out of Food in  the Last Year: Never true  Transportation Needs: No Transportation Needs (09/12/2023)   PRAPARE - Administrator, Civil Service (Medical): No    Lack of Transportation (Non-Medical): No  Physical Activity: Inactive (09/13/2023)   Exercise Vital Sign    Days of Exercise per Week: 0 days    Minutes of Exercise per Session: 0 min  Stress: Stress Concern Present (09/12/2023)   [employer] of Occupational Health - Occupational Stress Questionnaire    Feeling of Stress : To some extent  Social Connections: Moderately Integrated (09/12/2023)   Social Connection and Isolation Panel [NHANES]    Frequency of Communication with Friends and Family: More than three times a week    Frequency of Social Gatherings with Friends and Family: Twice a week    Attends Religious Services: More than 4 times per year    Active Member of [employer] or Organizations: Yes    Attends Banker Meetings: More than 4 times per year    Marital Status: Never married  Intimate Partner Violence: Not At Risk (09/13/2023)   Humiliation, Afraid, Rape, and Kick questionnaire    Fear of Current or Ex-Partner: No    Emotionally Abused: No    Physically Abused: No    Sexually Abused: No     Current Outpatient Medications:    acetaminophen (TYLENOL) 325 MG tablet, Take 325 mg by mouth every 6 (six) hours as needed. PRN, Disp: , Rfl:    albuterol (VENTOLIN HFA) 108 (90 Base) MCG/ACT inhaler, Inhale 2 puffs into the lungs every 6 (six) hours as needed for wheezing or shortness of breath., Disp: 18 g, Rfl: 0   amLODipine (NORVASC) 2.5 MG tablet, Take 1 tablet (2.5 mg total) by mouth daily., Disp: 90 tablet, Rfl: 1   aspirin EC 81 MG tablet, Take 1 tablet (81 mg total) by mouth  daily. Swallow whole., Disp: , Rfl:    buPROPion (WELLBUTRIN XL) 300 MG 24 hr tablet, TAKE 1 TABLET(300 MG) BY MOUTH DAILY, Disp: 90 tablet, Rfl: 1   cyanocobalamin (VITAMIN B12) 1000 MCG/ML injection, Inject into the muscle., Disp: ,  Rfl:    fluticasone (FLONASE) 50 MCG/ACT nasal spray, SHAKE LIQUID AND USE 2 SPRAYS IN EACH NOSTRIL DAILY, Disp: 48 g, Rfl: 0   loratadine (CLARITIN) 10 MG tablet, Take 1 tablet (10 mg total) by mouth daily., Disp: 90 tablet, Rfl: 1   meloxicam (MOBIC) 15 MG tablet, Take 1 tablet (15 mg total) by mouth daily., Disp: 30 tablet, Rfl: 3   montelukast (SINGULAIR) 10 MG tablet, TAKE 1 TABLET(10 MG) BY MOUTH AT BEDTIME, Disp: 90 tablet, Rfl: 1   nystatin cream (MYCOSTATIN), Apply 1 Application topically 2 (two) times daily., Disp: 30 g, Rfl: 5   omeprazole (PRILOSEC) 40 MG capsule, Take 1 capsule (40 mg total) by mouth daily., Disp: 90 capsule, Rfl: 0   QUEtiapine (SEROQUEL) 25 MG tablet, TAKE 1 TABLET(25 MG) BY MOUTH AT BEDTIME, Disp: 90 tablet, Rfl: 1   rosuvastatin (CRESTOR) 40 MG tablet, Take 1 tablet (40 mg total) by mouth daily., Disp: 90 tablet, Rfl: 1   triamterene-hydrochlorothiazide (MAXZIDE-25) 37.5-25 MG tablet, Take 1 tablet by mouth daily. In place of HCTZ 25 , BP medication, Disp: 90 tablet, Rfl: 1   Vitamin D, Ergocalciferol, (DRISDOL) 1.25 MG (50000 UNIT) CAPS capsule, Take 1 capsule (50,000 Units total) by mouth every 7 (seven) days., Disp: 12 capsule, Rfl: 1   furosemide (LASIX) 20 MG tablet, Take 1 tablet (20 mg total) by mouth daily as needed. For leg swelling, Disp: 30 tablet, Rfl: 5  Allergies  Allergen Reactions   Ace Inhibitors     angioedema   Codeine Swelling   Valsartan Swelling     ROS  Constitutional: Negative for fever or weight change.  Respiratory: Negative for cough and shortness of breath.   Cardiovascular: Negative for chest pain or palpitations.  Gastrointestinal: Negative for abdominal pain, no bowel changes.  Musculoskeletal: Negative for gait problem or joint swelling.  Skin: Negative for rash.  Neurological: Negative for dizziness or headache.  No other specific complaints in a complete review of systems (except as listed in HPI above).    Objective   Vitals:   09/13/23 1019  BP: 112/74  Pulse: 79  Resp: 12  Temp: 98.1 F (36.7 C)  TempSrc: Oral  SpO2: 96%  Weight: 221 lb 4.8 oz (100.4 kg)  Height: 5' 7.5" (1.715 m)    Body mass index is 34.15 kg/m.  Physical Exam  Constitutional: Patient appears well-developed and well-nourished. No distress.  HENT: Head: Normocephalic and atraumatic. Ears: B TMs ok, no erythema or effusion; Nose: Nose normal. Mouth/Throat: Oropharynx is clear and moist. No oropharyngeal exudate.  Eyes: Conjunctivae and EOM are normal. Pupils are equal, round, and reactive to light. No scleral icterus.  Neck: Normal range of motion. Neck supple. No JVD present. No thyromegaly present.  Cardiovascular: Normal rate, regular rhythm and normal heart sounds.  No murmur heard. No BLE edema. Pulmonary/Chest: Effort normal and breath sounds normal. No respiratory distress. Abdominal: Soft. Bowel sounds are normal, no distension. There is no tenderness. no masses Breast: she had it done by GYN FEMALE GENITALIA:  Not done - sees gyn RECTAL: not done  Musculoskeletal: Normal range of motion, no joint effusions. No gross deformities Neurological: he is alert and oriented to person, place, and time. No  cranial nerve deficit. Coordination, balance, strength, speech and gait are normal.  Skin: Skin is warm and dry. No rash noted. No erythema.  Psychiatric: Patient has a normal mood and affect. behavior is normal. Judgment and thought content normal.   Recent Results (from the past 2160 hour(s))  Iron and TIBC([facility])     Status: None   Collection Time: 07/26/23  2:40 PM  Result Value Ref Range   Iron 96 28 - 170 ug/dL   TIBC 161 096 - 045 ug/dL   Saturation Ratios 25 10.4 - 31.8 %   UIBC 285 ug/dL    Comment: Performed at [facility], [address] [place], [place] [address]  Ferritin     Status: None   Collection Time: 07/26/23  2:40 PM  Result Value Ref Range   Ferritin  244 11 - 307 ng/mL    Comment: Performed at [facility], [address] [place], [place] [address]  CBC with Differential (Cancer Center Only)     Status: None   Collection Time: 07/26/23  2:40 PM  Result Value Ref Range   WBC Count 6.2 4.0 - 10.5 K/uL   RBC 4.29 3.87 - 5.11 MIL/uL   Hemoglobin 13.2 12.0 - 15.0 g/dL   HCT 82.9 56.2 - 13.0 %   MCV 89.5 80.0 - 100.0 fL   MCH 30.8 26.0 - 34.0 pg   MCHC 34.4 30.0 - 36.0 g/dL   RDW 86.5 78.4 - 69.6 %   Platelet Count 255 150 - 400 K/uL   nRBC 0.0 0.0 - 0.2 %   Neutrophils Relative % 69 %   Neutro Abs 4.3 1.7 - 7.7 K/uL   Lymphocytes Relative 22 %   Lymphs Abs 1.4 0.7 - 4.0 K/uL   Monocytes Relative 7 %   Monocytes Absolute 0.4 0.1 - 1.0 K/uL   Eosinophils Relative 1 %   Eosinophils Absolute 0.1 0.0 - 0.5 K/uL   Basophils Relative 1 %   Basophils Absolute 0.0 0.0 - 0.1 K/uL   Immature Granulocytes 0 %   Abs Immature Granulocytes 0.01 0.00 - 0.07 K/uL    Comment: Performed at [facility], [address] [place], [place] [address]     Fall Risk:    09/13/2023   10:22 AM 06/11/2023   11:37 AM 04/15/2023    7:39 AM 03/13/2023    3:34 PM 12/11/2022    2:09 PM  Fall Risk   Falls in the past year? 0 0 0 1 0  Number falls in past yr:   0 0   Injury with Fall?   0 1   Risk for fall due to : No Fall Risks No Fall Risks No Fall Risks History of fall(s) No Fall Risks  Follow up Falls prevention discussed;Education provided;Falls evaluation completed Falls prevention discussed Falls prevention discussed Falls prevention discussed;Education provided;Falls evaluation completed Falls prevention discussed;Education provided;Falls evaluation completed     Functional Status Survey: Is the patient deaf or have difficulty hearing?: No Does the patient have difficulty seeing, even when wearing glasses/contacts?: Yes Does the patient have difficulty concentrating, remembering, or making decisions?: Yes Does the patient have  difficulty walking or climbing stairs?: Yes Does the patient have difficulty dressing or bathing?: No Does the patient have difficulty doing errands alone such as visiting a doctor's office or shopping?: No Independent   Assessment & Plan  1. Well adult exam  Discussed at least stretching at home and slow walks  Return for  Tdap and flu shot    -USPSTF grade A and B recommendations reviewed with patient; age-appropriate recommendations, preventive care, screening tests, etc discussed and encouraged; healthy living encouraged; see AVS for patient education given to patient -Discussed importance of 150 minutes of physical activity weekly, eat two servings of fish weekly, eat one serving of tree nuts ( cashews, pistachios, pecans, almonds.[name]) every other day, eat 6 servings of fruit/vegetables daily and drink plenty of water and avoid sweet beverages.   -Reviewed Health Maintenance: Yes.

## 2023-09-13 ENCOUNTER — Other Ambulatory Visit: Payer: Self-pay

## 2023-09-13 ENCOUNTER — Ambulatory Visit (INDEPENDENT_AMBULATORY_CARE_PROVIDER_SITE_OTHER): Payer: BC Managed Care – PPO | Admitting: Family Medicine

## 2023-09-13 ENCOUNTER — Encounter: Payer: Self-pay | Admitting: Family Medicine

## 2023-09-13 VITALS — BP 112/74 | HR 79 | Temp 98.1°F | Resp 12 | Ht 67.5 in | Wt 221.3 lb

## 2023-09-13 DIAGNOSIS — Z23 Encounter for immunization: Secondary | ICD-10-CM | POA: Diagnosis not present

## 2023-09-13 DIAGNOSIS — Z0000 Encounter for general adult medical examination without abnormal findings: Secondary | ICD-10-CM | POA: Diagnosis not present

## 2023-09-13 MED ORDER — TIRZEPATIDE-WEIGHT MANAGEMENT 2.5 MG/0.5ML ~~LOC~~ SOLN: 2.5000 mg | SUBCUTANEOUS | 0 refills | Status: DC

## 2023-09-18 ENCOUNTER — Ambulatory Visit: Payer: BC Managed Care – PPO | Admitting: Family Medicine

## 2023-11-13 ENCOUNTER — Ambulatory Visit: Payer: BC Managed Care – PPO | Admitting: Family Medicine

## 2023-11-13 DIAGNOSIS — E785 Hyperlipidemia, unspecified: Secondary | ICD-10-CM

## 2023-11-13 DIAGNOSIS — R7303 Prediabetes: Secondary | ICD-10-CM

## 2023-11-13 DIAGNOSIS — E559 Vitamin D deficiency, unspecified: Secondary | ICD-10-CM

## 2023-11-13 DIAGNOSIS — I10 Essential (primary) hypertension: Secondary | ICD-10-CM | POA: Diagnosis not present

## 2023-11-13 DIAGNOSIS — M545 Low back pain, unspecified: Secondary | ICD-10-CM | POA: Diagnosis not present

## 2023-11-13 DIAGNOSIS — F321 Major depressive disorder, single episode, moderate: Secondary | ICD-10-CM | POA: Diagnosis not present

## 2023-11-13 DIAGNOSIS — M1612 Unilateral primary osteoarthritis, left hip: Secondary | ICD-10-CM

## 2023-11-13 DIAGNOSIS — K219 Gastro-esophageal reflux disease without esophagitis: Secondary | ICD-10-CM

## 2023-11-13 DIAGNOSIS — J4520 Mild intermittent asthma, uncomplicated: Secondary | ICD-10-CM

## 2023-11-13 LAB — POCT URINALYSIS DIPSTICK
Blood, UA: NEGATIVE
Glucose, UA: NEGATIVE
Leukocytes, UA: NEGATIVE
Nitrite, UA: NEGATIVE
Odor: NORMAL
Protein, UA: NEGATIVE
Spec Grav, UA: >=1.03 — AB (ref 1.010–1.025)
Urobilinogen, UA: 1 U/dL
pH, UA: 5 (ref 5.0–8.0)

## 2023-11-13 MED ORDER — AMLODIPINE BESYLATE 2.5 MG PO TABS: 2.5000 mg | ORAL_TABLET | Freq: Every day | ORAL | 1 refills | Status: DC

## 2023-11-13 MED ORDER — ZEPBOUND 5 MG/0.5ML ~~LOC~~ SOAJ
5.0000 mg | SUBCUTANEOUS | 0 refills | Status: DC
Start: 2023-11-13 — End: 2024-02-11

## 2023-11-13 MED ORDER — ZEPBOUND 10 MG/0.5ML ~~LOC~~ SOAJ
10.0000 mg | SUBCUTANEOUS | 0 refills | Status: DC
  Filled 2024-02-04: qty 2, 28d supply, fill #0

## 2023-11-13 MED ORDER — ROSUVASTATIN CALCIUM 40 MG PO TABS: 40.0000 mg | ORAL_TABLET | Freq: Every day | ORAL | 1 refills | Status: DC

## 2023-11-13 MED ORDER — TRIAMTERENE-HCTZ 37.5-25 MG PO TABS
1.0000 | ORAL_TABLET | Freq: Every day | ORAL | 1 refills | Status: DC
Start: 2023-11-13 — End: 2024-08-25

## 2023-11-13 MED ORDER — BACLOFEN 10 MG PO TABS: 10.0000 mg | ORAL_TABLET | Freq: Three times a day (TID) | ORAL | 0 refills | Status: DC | PRN

## 2023-11-13 MED ORDER — ZEPBOUND 7.5 MG/0.5ML ~~LOC~~ SOAJ: 7.5000 mg | SUBCUTANEOUS | 0 refills | Status: DC

## 2023-11-13 MED ORDER — MONTELUKAST SODIUM 10 MG PO TABS: 10.0000 mg | ORAL_TABLET | Freq: Every day | ORAL | 1 refills | Status: DC

## 2023-11-13 MED ORDER — BUPROPION HCL ER (XL) 300 MG PO TB24: 300.0000 mg | ORAL_TABLET | Freq: Every day | ORAL | 1 refills | Status: DC

## 2023-11-13 MED ORDER — VITAMIN D (ERGOCALCIFEROL) 1.25 MG (50000 UNIT) PO CAPS
50000.0000 [IU] | ORAL_CAPSULE | ORAL | 1 refills | Status: DC
Start: 2023-11-13 — End: 2024-08-25

## 2023-11-13 MED ORDER — ALBUTEROL SULFATE HFA 108 (90 BASE) MCG/ACT IN AERS: 2.0000 | INHALATION_SPRAY | Freq: Four times a day (QID) | RESPIRATORY_TRACT | 0 refills | Status: AC | PRN

## 2023-11-13 MED ORDER — OMEPRAZOLE 40 MG PO CPDR: 40.0000 mg | DELAYED_RELEASE_CAPSULE | Freq: Every day | ORAL | 0 refills | Status: DC

## 2023-11-13 NOTE — Progress Notes (Signed)
 Name: Savannah Manning   MRN: [id number]    DOB: 03/04/1961   Date:11/13/2023       Progress Note  Subjective  Chief Complaint  Chief Complaint  Patient presents with   Medical Management of Chronic Issues   Back Pain    Since Dec    HPI  MDD: could not tolerate SSRI's, doing better on Wellbutrin , she is very worried about losing her job in March, company is closing site , she is looking for jobs now  Morbidly Obese /prediabetes: .  She has been obese since childhood. She has tried multiple diets in the past, she had gastric bypass in 2005. Weight before surgery was 314 lbs, she went down to 164 lbs, but has been gradually gaining weight. She states her weight has gone up since she started to work from home in  2010.  She had her surgery done at [facility], and was denied for a revision in the Summer 2018.  She has failed Metformin , started her on Ozempic   04/2017 her weight was 259 lbs, her weight went as low as 239 lbs. Her insurance has denied Ozempic , only paying for patients that have DM. She states is not covering Saxenda  again, we gave rx of Contrave  Dec 2021 but she stopped due to cost. She gained more weight and we resumed Saxenda  March 2023 her weight at the start of therapy was 278 lbs and it went  down to 264 lbs August 2023 - we added Contrave  in 2023 however she we switched to wellbutrin  XL she is now out of saxenda  due to [employer], , on her visit in Feb 2024 her weight was 272 lbs , it went as low as 221 lbs today is up to 223.2 lbs due to the holidays also because of cost and gap in getting medication, she just resumed Zepbound  2.5 mg in Dec  we will titrate up the dose to 5 mg today   Asthma mild intermittent: she is doing well, nocturnal cough , she is taking singulair . She states currently coughing when she first goes down to sleep, but no sob or wheezing.    HTN:  She denies chest pain or palpitation. She is on Maxzide and norvasc  . She sees cardiologist and takes furosemide   prn lower extremity edema   Dyslipidemia: LDL was down to 103, she is taking rosuvastatin  daily and denies side effects    Vitamin D  deficiency/ vitamin B12 deficiency : continue rx vitamin D , she is getting b12 shots at hematologist . Unchanged   Iron  deficiency anemia: seen by hematologist and had some iron  infusion, due to malabsorption secondary to bariatric surgery . Last ferritin was 244    GERD: she is taking Omeprazole  given by Dr. Unk, symptoms are controlled   OA of left hip and sees Dr. Karlis, she is supposed to have surgery , she states over the past couple of weeks she has noticed some low back pain and also left groin pain. Explained it may be radiculitis or OA of right hip. She had Bariatric surgery but continues to take NSAID's for pain, explained again that she can only take Tylenol  for pain.    History of UTI: she asked to recheck ua due to low back pain and urinalysis was essentially normal     Patient Active Problem List   Diagnosis Date Noted   Lumbar spondylosis 05/06/2023   Primary osteoarthritis of left hip 05/06/2023   Morbid obesity (HCC) 03/13/2023   Moderate major depression (  HCC) 05/01/2022   Intertrigo 05/01/2022   Insomnia due to mental condition 05/01/2022   Mild intermittent asthma without complication 10/20/2020   Primary osteoarthritis of both knees 10/20/2020   Primary osteoarthritis of right knee 04/06/2020   Primary osteoarthritis of left knee 04/06/2020   Pre-diabetes 12/05/2018   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/07/2018   Chronic pain of right knee 07/26/2017   Vitamin D  deficiency 04/24/2017   B12 deficiency 04/24/2017   Abnormal mammogram of right breast 01/01/2017   Asthma, mild persistent 12/17/2016   History of shoulder surgery 10/12/2016   Primary osteoarthritis of left shoulder 06/14/2016   Incomplete tear of left rotator cuff 05/30/2016   Rotator cuff tendinitis 05/30/2016   History of bariatric surgery 05/04/2016   Allergic rhinitis  05/12/2015   Benign essential HTN 05/12/2015   Dyslipidemia 05/12/2015   Bilateral lower extremity edema 05/12/2015   Gastroesophageal reflux disease without esophagitis 05/12/2015   Migraine 05/12/2015   Plantar fasciitis 05/12/2015   Umbilical hernia without obstruction and without gangrene 05/12/2015   Morbid obesity with BMI of 40.0-44.9, adult (HCC) 02/13/2008    Past Surgical History:  Procedure Laterality Date   ABDOMINAL HYSTERECTOMY  2012   supra cervical   CHOLECYSTECTOMY     COLONOSCOPY WITH PROPOFOL  N/A 12/18/2019   Procedure: COLONOSCOPY WITH PROPOFOL ;  Surgeon: Unk Corinn Skiff, MD;  Location: [facility];  Service: Gastroenterology;  Laterality: N/A;   DILATION AND CURETTAGE OF UTERUS     ESOPHAGOGASTRODUODENOSCOPY (EGD) WITH PROPOFOL  N/A 03/18/2020   Procedure: ESOPHAGOGASTRODUODENOSCOPY (EGD) WITH PROPOFOL ;  Surgeon: Unk Corinn Skiff, MD;  Location: [facility];  Service: Gastroenterology;  Laterality: N/A;   GASTRIC BYPASS  2005   INDUCED ABORTION N/A 1997   patient was about 22 weeks   POLYPECTOMY  12/18/2019   Procedure: POLYPECTOMY;  Surgeon: Unk Corinn Skiff, MD;  Location: [facility];  Service: Gastroenterology;;   SHOULDER ARTHROSCOPY WITH OPEN ROTATOR CUFF REPAIR Left 06/14/2016   Procedure: SHOULDER ARTHROSCOPY WITH OPEN ROTATOR CUFF REPAIR, DECOMPRESSION, EXCISION LOOSE BODY, DEBRIDEMENT;  Surgeon: Norleen JINNY Maltos, MD;  Location: [facility];  Service: Orthopedics;  Laterality: Left;    Family History  Problem Relation Age of Onset   Hypertension Mother    Cancer Father        prostate   Heart disease Brother    Breast cancer Cousin 40       mat cousin    Social History   Tobacco Use   Smoking status: Never   Smokeless tobacco: Never  Substance Use Topics   Alcohol use: No    Alcohol/week: 0.0 standard drinks of alcohol     Current Outpatient Medications:    acetaminophen  (TYLENOL ) 325 MG tablet, Take 325 mg by  mouth every 6 (six) hours as needed. PRN, Disp: , Rfl:    albuterol  (VENTOLIN  HFA) 108 (90 Base) MCG/ACT inhaler, Inhale 2 puffs into the lungs every 6 (six) hours as needed for wheezing or shortness of breath., Disp: 18 g, Rfl: 0   amLODipine  (NORVASC ) 2.5 MG tablet, Take 1 tablet (2.5 mg total) by mouth daily., Disp: 90 tablet, Rfl: 1   aspirin  EC 81 MG tablet, Take 1 tablet (81 mg total) by mouth daily. Swallow whole., Disp: , Rfl:    buPROPion  (WELLBUTRIN  XL) 300 MG 24 hr tablet, TAKE 1 TABLET(300 MG) BY MOUTH DAILY, Disp: 90 tablet, Rfl: 1   cyanocobalamin  (VITAMIN B12) 1000 MCG/ML injection, Inject into the muscle., Disp: , Rfl:    fluticasone  (  FLONASE ) 50 MCG/ACT nasal spray, SHAKE LIQUID AND USE 2 SPRAYS IN EACH NOSTRIL DAILY, Disp: 48 g, Rfl: 0   loratadine  (CLARITIN ) 10 MG tablet, Take 1 tablet (10 mg total) by mouth daily., Disp: 90 tablet, Rfl: 1   meloxicam  (MOBIC ) 15 MG tablet, Take 1 tablet (15 mg total) by mouth daily., Disp: 30 tablet, Rfl: 3   montelukast  (SINGULAIR ) 10 MG tablet, TAKE 1 TABLET(10 MG) BY MOUTH AT BEDTIME, Disp: 90 tablet, Rfl: 1   nystatin  cream (MYCOSTATIN ), Apply 1 Application topically 2 (two) times daily., Disp: 30 g, Rfl: 5   omeprazole  (PRILOSEC) 40 MG capsule, Take 1 capsule (40 mg total) by mouth daily., Disp: 90 capsule, Rfl: 0   QUEtiapine  (SEROQUEL ) 25 MG tablet, TAKE 1 TABLET(25 MG) BY MOUTH AT BEDTIME, Disp: 90 tablet, Rfl: 1   rosuvastatin  (CRESTOR ) 40 MG tablet, Take 1 tablet (40 mg total) by mouth daily., Disp: 90 tablet, Rfl: 1   tirzepatide  (ZEPBOUND ) 2.5 MG/0.5ML injection vial, Inject 2.5 mg into the skin once a week., Disp: 2 mL, Rfl: 0   triamterene -hydrochlorothiazide  (MAXZIDE-25) 37.5-25 MG tablet, Take 1 tablet by mouth daily. In place of HCTZ 25 , BP medication, Disp: 90 tablet, Rfl: 1   Vitamin D , Ergocalciferol , (DRISDOL ) 1.25 MG (50000 UNIT) CAPS capsule, Take 1 capsule (50,000 Units total) by mouth every 7 (seven) days., Disp: 12  capsule, Rfl: 1   furosemide  (LASIX ) 20 MG tablet, Take 1 tablet (20 mg total) by mouth daily as needed. For leg swelling, Disp: 30 tablet, Rfl: 5  Allergies  Allergen Reactions   Ace Inhibitors     angioedema   Codeine  Swelling   Valsartan Swelling    I personally reviewed active problem list, medication list, allergies, family history with the patient/caregiver today.   ROS  Ten systems reviewed and is negative except as mentioned in HPI    Objective  Vitals:   11/13/23 1537  BP: 136/88  Pulse: 64  Resp: 16  SpO2: 99%  Weight: 223 lb 3.2 oz (101.2 kg)  Height: 5' 7.5 (1.715 m)    Body mass index is 34.44 kg/m.  Physical Exam  Constitutional: Patient appears well-developed and well-nourished. Obese  No distress.  HEENT: head atraumatic, normocephalic, pupils equal and reactive to light, neck supple Cardiovascular: Normal rate, regular rhythm and normal heart sounds.  No murmur heard. No BLE edema. Pulmonary/Chest: Effort normal and breath sounds normal. No respiratory distress. Abdominal: Soft.  There is no tenderness. Muscular skeletal: pain on low back with palpation, negative straight leg raise, pain with rom of both hips  Psychiatric: Patient has a normal mood and affect. behavior is normal. Judgment and thought content normal.   Recent Results (from the past 2160 hours)  POCT urinalysis dipstick     Status: Abnormal   Collection Time: 11/13/23  3:46 PM  Result Value Ref Range   Color, UA yellow    Clarity, UA clear    Glucose, UA Negative Negative   Bilirubin, UA small    Ketones, UA small    Spec Grav, UA >=1.030 (A) 1.010 - 1.025   Blood, UA Negative    pH, UA 5.0 5.0 - 8.0   Protein, UA Negative Negative   Urobilinogen, UA 1.0 0.2 or 1.0 E.U./dL   Nitrite, UA Negative    Leukocytes, UA Negative Negative   Appearance yellow    Odor normal     Diabetic Foot Exam:     PHQ2/9:    11/13/2023  3:22 PM 09/13/2023   10:28 AM 06/11/2023   11:37 AM  04/15/2023    7:48 AM 03/13/2023    3:34 PM  Depression screen PHQ 2/9  Decreased Interest 0 0 0 0 0  Down, Depressed, Hopeless 0 0 0 0 0  PHQ - 2 Score 0 0 0 0 0  Altered sleeping 0 1 3 3 1   Tired, decreased energy 0 1 3 3 1   Change in appetite 0 0 0 0 1  Feeling bad or failure about yourself  0 0 0 0 0  Trouble concentrating 0 1 3 3 2   Moving slowly or fidgety/restless 0 0 0 0 0  Suicidal thoughts 0 0 0 0 0  PHQ-9 Score 0 3 9 9 5   Difficult doing work/chores Not difficult at all Not difficult at all Not difficult at all  Not difficult at all    phq 9 is negative   Fall Risk:    11/13/2023    3:22 PM 09/13/2023   10:22 AM 06/11/2023   11:37 AM 04/15/2023    7:39 AM 03/13/2023    3:34 PM  Fall Risk   Falls in the past year? 0 0 0 0 1  Number falls in past yr: 0   0 0  Injury with Fall? 0   0 1  Risk for fall due to : No Fall Risks No Fall Risks No Fall Risks No Fall Risks History of fall(s)  Follow up Falls prevention discussed;Education provided;Falls evaluation completed Falls prevention discussed;Education provided;Falls evaluation completed Falls prevention discussed Falls prevention discussed Falls prevention discussed;Education provided;Falls evaluation completed      Assessment & Plan   1. Morbid obesity (HCC) (Primary)  - tirzepatide  (ZEPBOUND ) 5 MG/0.5ML Pen; Inject 5 mg into the skin once a week.  Dispense: 2 mL; Refill: 0 - tirzepatide  (ZEPBOUND ) 7.5 MG/0.5ML Pen; Inject 7.5 mg into the skin once a week.  Dispense: 2 mL; Refill: 0 - tirzepatide  (ZEPBOUND ) 10 MG/0.5ML Pen; Inject 10 mg into the skin once a week.  Dispense: 2 mL; Refill: 0  2. Moderate major depression (HCC)  - buPROPion  (WELLBUTRIN  XL) 300 MG 24 hr tablet; Take 1 tablet (300 mg total) by mouth daily.  Dispense: 90 tablet; Refill: 1  3. Benign essential HTN  - triamterene -hydrochlorothiazide  (MAXZIDE-25) 37.5-25 MG tablet; Take 1 tablet by mouth daily. In place of HCTZ 25 , BP medication  Dispense:  90 tablet; Refill: 1 - amLODipine  (NORVASC ) 2.5 MG tablet; Take 1 tablet (2.5 mg total) by mouth daily.  Dispense: 90 tablet; Refill: 1  4. Pre-diabetes  On GLP-1 agonist  5. Primary osteoarthritis of left hip  Follow up with Dr. Karlis  6. Acute bilateral low back pain without sciatica  - POCT urinalysis dipstick - baclofen  (LIORESAL ) 10 MG tablet; Take 1 tablet (10 mg total) by mouth 3 (three) times daily as needed for muscle spasms.  Dispense: 60 each; Refill: 0  7. Mild intermittent asthma without complication  - montelukast  (SINGULAIR ) 10 MG tablet; Take 1 tablet (10 mg total) by mouth at bedtime.  Dispense: 90 tablet; Refill: 1 - albuterol  (VENTOLIN  HFA) 108 (90 Base) MCG/ACT inhaler; Inhale 2 puffs into the lungs every 6 (six) hours as needed for wheezing or shortness of breath.  Dispense: 18 g; Refill: 0  8. Vitamin D  deficiency  - Vitamin D , Ergocalciferol , (DRISDOL ) 1.25 MG (50000 UNIT) CAPS capsule; Take 1 capsule (50,000 Units total) by mouth every 7 (seven) days.  Dispense: 12 capsule;  Refill: 1  9. Gastroesophageal reflux disease without esophagitis  - omeprazole  (PRILOSEC) 40 MG capsule; Take 1 capsule (40 mg total) by mouth daily.  Dispense: 90 capsule; Refill: 0  10. Dyslipidemia  - rosuvastatin  (CRESTOR ) 40 MG tablet; Take 1 tablet (40 mg total) by mouth daily.  Dispense: 90 tablet; Refill: 1

## 2023-11-29 ENCOUNTER — Inpatient Hospital Stay: Payer: BC Managed Care – PPO | Attending: Oncology

## 2023-11-29 ENCOUNTER — Other Ambulatory Visit: Payer: BC Managed Care – PPO

## 2023-11-29 DIAGNOSIS — D508 Other iron deficiency anemias: Secondary | ICD-10-CM | POA: Diagnosis not present

## 2023-11-29 DIAGNOSIS — Z9884 Bariatric surgery status: Secondary | ICD-10-CM | POA: Diagnosis not present

## 2023-11-29 DIAGNOSIS — D509 Iron deficiency anemia, unspecified: Secondary | ICD-10-CM

## 2023-11-29 DIAGNOSIS — E538 Deficiency of other specified B group vitamins: Secondary | ICD-10-CM | POA: Insufficient documentation

## 2023-11-29 LAB — CBC
HCT: 38.9 % (ref 36.0–46.0)
Hemoglobin: 13.5 g/dL (ref 12.0–15.0)
MCH: 30.5 pg (ref 26.0–34.0)
MCHC: 34.7 g/dL (ref 30.0–36.0)
MCV: 88 fL (ref 80.0–100.0)
Platelets: 235 10*3/uL (ref 150–400)
RBC: 4.42 MIL/uL (ref 3.87–5.11)
RDW: 12.6 % (ref 11.5–15.5)
WBC: 5.6 10*3/uL (ref 4.0–10.5)
nRBC: 0 % (ref 0.0–0.2)

## 2023-11-29 LAB — IRON AND TIBC
Iron: 78 ug/dL (ref 28–170)
Saturation Ratios: 20 % (ref 10.4–31.8)
TIBC: 393 ug/dL (ref 250–450)
UIBC: 315 ug/dL

## 2023-11-29 LAB — VITAMIN B12: Vitamin B-12: 358 pg/mL (ref 180–914)

## 2023-11-29 LAB — FERRITIN: Ferritin: 123 ng/mL (ref 11–307)

## 2023-12-19 ENCOUNTER — Encounter: Payer: Self-pay | Admitting: Cardiology

## 2023-12-20 ENCOUNTER — Telehealth: Payer: Self-pay | Admitting: Cardiology

## 2023-12-20 NOTE — Telephone Encounter (Signed)
Called and spoke with patient. Patient with complaint of intermittent chest "spasms" that started about a week ago. Patient reports that some times the pain start in her back and sometimes she has pain down her arm. Patient reports that she started having shortness of breath with exertion two days ago while walking up stairs. Patient does not have any current vital signs to report. Patient scheduled to be seen in clinic on 12/24/23. Pt made aware of ED precaution should any new symptoms develop or worsen.

## 2023-12-20 NOTE — Telephone Encounter (Signed)
Called patient and left message for call back.

## 2023-12-20 NOTE — Telephone Encounter (Signed)
See MyChart encounter

## 2023-12-20 NOTE — Telephone Encounter (Signed)
Attempted to reach the patient. Was unable to leave a message.  MyChart message sent

## 2023-12-20 NOTE — Telephone Encounter (Signed)
Patient returned RN's call.

## 2023-12-24 ENCOUNTER — Encounter: Payer: Self-pay | Admitting: Family Medicine

## 2023-12-24 ENCOUNTER — Encounter: Payer: Self-pay | Admitting: Medical

## 2023-12-24 ENCOUNTER — Ambulatory Visit: Payer: BC Managed Care – PPO | Attending: Medical | Admitting: Medical

## 2023-12-24 VITALS — BP 130/82 | HR 57 | Ht 67.0 in | Wt 217.4 lb

## 2023-12-24 DIAGNOSIS — R079 Chest pain, unspecified: Secondary | ICD-10-CM | POA: Diagnosis not present

## 2023-12-24 DIAGNOSIS — R0789 Other chest pain: Secondary | ICD-10-CM

## 2023-12-24 DIAGNOSIS — R072 Precordial pain: Secondary | ICD-10-CM | POA: Diagnosis not present

## 2023-12-24 DIAGNOSIS — I10 Essential (primary) hypertension: Secondary | ICD-10-CM | POA: Diagnosis not present

## 2023-12-24 MED ORDER — METOPROLOL TARTRATE 50 MG PO TABS
50.0000 mg | ORAL_TABLET | Freq: Once | ORAL | 0 refills | Status: DC
Start: 2023-12-24 — End: 2024-02-11

## 2023-12-24 NOTE — Progress Notes (Unsigned)
 Cardiology Office Note:  .   Date:  12/25/2023  ID:  Savannah Manning, DOB 1960-12-08, MRN [id number] PCP: Alba Cory, MD  [facility] Providers Cardiologist:  Debbe Odea, MD {  History of Present Illness: [name]   Savannah Manning is a 63 y.o. female with a h/o HTN, HLD, obesity who presents for follow-up.   Her brother died of a massive MI at 4. Echo 02/2022 showed LVEF 55-60%. Myoview lexiscan 02/2022 showed no ischemia, low risk study.   The patient was last seen 03/2023 and reported weight loss with GLP-1/zepbound.  Today, the patient reports she has been doing ok until a week ago when she started experiencing spasms in her chest. Its in the left side and into her back. It's like a squeezing in the chest. Not worse with exertion. It occurs when she's sitting. She also had arm numbness. Also had dizziness, headache, nauesa, and minor SOB. Symptoms have been constant. She is still taking the weight loss drug.  Studies Reviewed: [name]   EKG Interpretation Date/Time:  Tuesday December 24 2023 15:38:37 EST Ventricular Rate:  57 PR Interval:  172 QRS Duration:  88 QT Interval:  410 QTC Calculation: 399 R Axis:   3  Text Interpretation: Sinus bradycardia When compared with ECG of 01-Dec-2021 12:00, Questionable change in QRS axis Confirmed by [name], Kyle Stansell ([id number]) on 12/24/2023 3:44:28 PM    Echo 02/2022 1. Left ventricular ejection fraction, by estimation, is 55 to 60%. The  left ventricle has normal function. The left ventricle has no regional  wall motion abnormalities. There is mild left ventricular hypertrophy.  Left ventricular diastolic parameters  were normal. The average left ventricular global longitudinal strain is  -20.6 %. The global longitudinal strain is normal.   2. Right ventricular systolic function is normal. The right ventricular  size is mildly enlarged.   3. The mitral valve is normal in structure. Mild mitral valve  regurgitation.   4. The aortic valve  is tricuspid. Aortic valve regurgitation is not  visualized.   MPI 02/2022    No ST deviation was noted.   Pharmacological myocardial perfusion imaging study with no significant  ischemia Normal wall motion, EF estimated at 80% No EKG changes concerning for ischemia at peak stress or in recovery. CT coronary calcium scoring with mild coronary calcification, no significant aortic atherosclerosis Low risk scan     Signed, Dossie Arbour, MD, Ph.D [facility]      Physical Exam:   VS:  BP 130/82 (BP Location: Left Arm, Patient Position: Sitting, Cuff Size: Normal)   Pulse (!) 57   Ht 5\' 7"  (1.702 m)   Wt 217 lb 6.4 oz (98.6 kg)   SpO2 98%   BMI 34.05 kg/m    Wt Readings from Last 3 Encounters:  12/24/23 217 lb 6.4 oz (98.6 kg)  11/13/23 223 lb 3.2 oz (101.2 kg)  09/13/23 221 lb 4.8 oz (100.4 kg)    GEN: Well nourished, well developed in no acute distress NECK: No JVD; No carotid bruits CARDIAC: RRR, no murmurs, rubs, gallops RESPIRATORY:  Clear to auscultation without rales, wheezing or rhonchi  ABDOMEN: Soft, non-tender, non-distended EXTREMITIES:  No edema; No deformity   ASSESSMENT AND PLAN: .    Atypical chest pain Patient reports 1 week of chest discomfort described as a spasm in her chest with associated arm numbness, minor SOB, nausea. It is not worse with exertion. She also had MSK shoulder and upper back pain that is  reproducible on exam. EKG today is non-ischemic. MPI 2023 was low risk with mild coronary calcium.I will order a Cardiac CTA. Continue ASA 81mg  daily.   HTN BP today is OK. Continue amlodipine 2.5mg  daily and Maxzide     Dispo: Follow-up in 1 month  Signed, Kirat Mezquita David Stall, PA-C

## 2023-12-24 NOTE — Patient Instructions (Addendum)
 Medication Instructions:  No changes at this time.   *If you need a refill on your cardiac medications before your next appointment, please call your pharmacy*   Lab Work: BMET today  If you have labs (blood work) drawn today and your tests are completely normal, you will receive your results only by: MyChart Message (if you have MyChart) OR A paper copy in the mail If you have any lab test that is abnormal or we need to change your treatment, we will call you to review the results.   Testing/Procedures:   Your cardiac CT will be scheduled at one of the below locations:   [facility] [address] [place], [place] [address] [phone]  OR  [facility] [address] [place], [place] [address] [phone]  OR   [facility] [address] [place], [place] [address] [phone]  OR   [facility] [address] [place], [place] [address] [phone]  If scheduled at [facility], please arrive at the [facility] En[facility]ance (Entrance C2) of [facility] 30 minutes prior to test start time. You can use the FREE valet parking offered at entrance C (encouraged to control the heart rate for the test)  Proceed to the [facility] Radiology Department (first floor) to check-in and test prep.  All radiology patients and guests should use entrance C2 at [facility], accessed from [facility], even though the hospital's physical address listed is [address]    If scheduled at [facility] or [facility], please arrive 15 mins early for check-in and test prep.  There is spacious parking and easy access to the radiology department from the [facility] entrance. Please enter here and check-in with the desk attendant.   If scheduled at [facility], please  arrive 30 minutes early for check-in and test prep.  Please follow these instructions carefully (unless otherwise directed):  An IV will be required for this test and Nitroglycerin will be given.  Hold all erectile dysfunction medications at least 3 days (72 hrs) prior to test. (Ie viagra, cialis, sildenafil, tadalafil, etc)   On the Night Before the Test: Be sure to Drink plenty of water. Do not consume any caffeinated/decaffeinated beverages or chocolate 12 hours prior to your test. Do not take any antihistamines 12 hours prior to your test.  On the Day of the Test: Drink plenty of water until 1 hour prior to the test. Do not eat any food 1 hour prior to test. You may take your regular medications prior to the test.  Take metoprolol (Lopressor) 50 mg two hours prior to test. If you take Furosemide/Hydrochlorothiazide/Spironolactone/Chlorthalidone, please HOLD on the morning of the test. HOLD Maxzide Patients who wear a continuous glucose monitor MUST remove the device prior to scanning. FEMALES- please wear underwire-free bra if available, avoid dresses & tight clothing       After the Test: Drink plenty of water. After receiving IV contrast, you may experience a mild flushed feeling. This is normal. On occasion, you may experience a mild rash up to 24 hours after the test. This is not dangerous. If this occurs, you can take Benadryl 25 mg, Zyrtec, Claritin, or Allegra and increase your fluid intake. (Patients taking Tikosyn should avoid Benadryl, and may take Zyrtec, Claritin, or Allegra) If you experience trouble breathing, this can be serious.  If it is severe call 911 IMMEDIATELY. If it is mild, please call our office.  We will call to schedule your test 2-4 weeks out understanding that some insurance companies will need an authorization prior to the service being performed.   For more information and frequently asked questions, please visit our website :  [email or web address]  For non-scheduling related questions, please contact the cardiac imaging nurse navigator should you have any questions/concerns: Cardiac Imaging Nurse Navigators Direct Office Dial: [phone]   For scheduling needs, including cancellations and rescheduling, please call [place], [phone].    Follow-Up: At [facility], you and your health needs are our priority.  As part of our continuing mission to provide you with exceptional heart care, we have created designated Provider Care Teams.  These Care Teams include your primary Cardiologist (physician) and Advanced Practice Providers (APPs -  Physician Assistants and Nurse Practitioners) who all work together to provide you with the care you need, when you need it.   Your next appointment:   1 month(s)  Provider:   Debbe Odea, MD or Cadence [name], [place]

## 2023-12-25 LAB — BASIC METABOLIC PANEL
BUN/Creatinine Ratio: 17 (ref 12–28)
BUN: 15 mg/dL (ref 8–27)
CO2: 25 mmol/L (ref 20–29)
Calcium: 9.9 mg/dL (ref 8.7–10.3)
Chloride: 100 mmol/L (ref 96–106)
Creatinine, Ser: 0.87 mg/dL (ref 0.57–1.00)
Glucose: 92 mg/dL (ref 70–99)
Potassium: 4.5 mmol/L (ref 3.5–5.2)
Sodium: 141 mmol/L (ref 134–144)
eGFR: 76 mL/min/{1.73_m2} (ref >59)

## 2024-01-01 ENCOUNTER — Telehealth (HOSPITAL_COMMUNITY): Payer: Self-pay | Admitting: *Deleted

## 2024-01-01 NOTE — Telephone Encounter (Signed)
 Reaching out to patient to offer assistance regarding upcoming cardiac imaging study; pt verbalizes understanding of appt date/time, parking situation and where to check in, pre-test NPO status and medications ordered, and verified current allergies; name and call back number provided for further questions should they arise  Larey Brick RN Navigator Cardiac Imaging [facility] [phone] office [phone] cell  Patient to hold her 50mg  metoprolol tartrate if her HR is less than 65 bpm.

## 2024-01-02 ENCOUNTER — Ambulatory Visit
Admission: RE | Admit: 2024-01-02 | Discharge: 2024-01-02 | Disposition: A | Discharge status: Home / Self Care | Payer: BC Managed Care – PPO | Source: Ambulatory Visit | Attending: Medical | Admitting: Medical

## 2024-01-02 DIAGNOSIS — I2510 Atherosclerotic heart disease of native coronary artery without angina pectoris: Secondary | ICD-10-CM

## 2024-01-02 DIAGNOSIS — R0789 Other chest pain: Secondary | ICD-10-CM | POA: Diagnosis not present

## 2024-01-02 MED ORDER — SODIUM CHLORIDE 0.9 % IV BOLUS
150.0000 mL | Freq: Once | INTRAVENOUS | Status: AC
  Administered 2024-01-02: 150 mL via INTRAVENOUS

## 2024-01-02 MED ORDER — METOPROLOL TARTRATE 5 MG/5ML IV SOLN
10.0000 mg | Freq: Once | INTRAVENOUS | Status: AC | PRN
  Administered 2024-01-02: 10 mg via INTRAVENOUS

## 2024-01-02 MED ORDER — DILTIAZEM HCL 25 MG/5ML IV SOLN: 10.0000 mg | INTRAVENOUS | Status: DC | PRN

## 2024-01-02 MED ORDER — IOHEXOL 350 MG/ML SOLN
100.0000 mL | Freq: Once | INTRAVENOUS | Status: AC | PRN
  Administered 2024-01-02: 100 mL via INTRAVENOUS

## 2024-01-02 MED ORDER — NITROGLYCERIN 0.4 MG SL SUBL
0.8000 mg | SUBLINGUAL_TABLET | Freq: Once | SUBLINGUAL | Status: AC
  Administered 2024-01-02: 0.8 mg via SUBLINGUAL

## 2024-01-02 NOTE — Progress Notes (Signed)
 Patient tolerated procedure well. Ambulate w/o difficulty. Denies light headedness or being dizzy. Sitting up drinking water provided. Encouraged to drink extra water today and reasoning explained. Verbalized understanding. All questions answered. ABC intact. No further needs. Discharge from procedure area w/o issues.

## 2024-01-09 NOTE — Progress Notes (Signed)
Attempted to contact patient to discuss further.  Left call back number.

## 2024-01-17 ENCOUNTER — Encounter: Payer: Self-pay | Admitting: Emergency Medicine

## 2024-01-24 ENCOUNTER — Encounter: Payer: Self-pay | Admitting: Medical

## 2024-01-24 ENCOUNTER — Ambulatory Visit

## 2024-01-24 ENCOUNTER — Ambulatory Visit: Payer: BC Managed Care – PPO | Attending: Medical | Admitting: Medical

## 2024-01-24 VITALS — BP 115/82 | HR 49 | Ht 67.0 in | Wt 211.8 lb

## 2024-01-24 DIAGNOSIS — I10 Essential (primary) hypertension: Secondary | ICD-10-CM

## 2024-01-24 DIAGNOSIS — R001 Bradycardia, unspecified: Secondary | ICD-10-CM

## 2024-01-24 DIAGNOSIS — R0789 Other chest pain: Secondary | ICD-10-CM

## 2024-01-24 DIAGNOSIS — E782 Mixed hyperlipidemia: Secondary | ICD-10-CM

## 2024-01-24 NOTE — Patient Instructions (Addendum)
 Medication Instructions:  Your physician recommends that you continue on your current medications as directed. Please refer to the Current Medication list given to you today.  *If you need a refill on your cardiac medications before your next appointment, please call your pharmacy*   Lab Work: None Ordered   If you have labs (blood work) drawn today and your tests are completely normal, you will receive your results only by: MyChart Message (if you have MyChart) OR A paper copy in the mail If you have any lab test that is abnormal or we need to change your treatment, we will call you to review the results.   Testing/Procedures:   Your physician has recommended that you wear a Zio monitor for 7 days.  This monitor is a medical device that records the heart's electrical activity. Doctors most often use these monitors to diagnose arrhythmias. Arrhythmias are problems with the speed or rhythm of the heartbeat. The monitor is a small device applied to your chest. You can wear one while you do your normal daily activities. While wearing this monitor if you have any symptoms to push the button and record what you felt. Once you have worn this monitor for the period of time provider prescribed (Usually 14 days), you will return the monitor device in the postage paid box. Once it is returned they will download the data collected and provide [place] with a report which the provider will then review and we will call you with those results. Important tips:  Avoid showering during the first 24 hours of wearing the monitor. Avoid excessive sweating to help maximize wear time. Do not submerge the device, no hot tubs, and no swimming pools. Keep any lotions or oils away from the patch. After 24 hours you may shower with the patch on. Take brief showers with your back facing the shower head.  Do not remove patch once it has been placed because that will interrupt data and decrease adhesive wear time. Push the  button when you have any symptoms and write down what you were feeling. Once you have completed wearing your monitor, remove and place into box which has postage paid and place in your outgoing mailbox.  If for some reason you have misplaced your box then call our office and we can provide another box and/or mail it off for you.      Follow-Up: At [facility], you and your health needs are our priority.  As part of our continuing mission to provide you with exceptional heart care, we have created designated Provider Care Teams.  These Care Teams include your primary Cardiologist (physician) and Advanced Practice Providers (APPs -  Physician Assistants and Nurse Practitioners) who all work together to provide you with the care you need, when you need it.  We recommend signing up for the patient portal called "MyChart".  Sign up information is provided on this After Visit Summary.  MyChart is used to connect with patients for Virtual Visits (Telemedicine).  Patients are able to view lab/test results, encounter notes, upcoming appointments, etc.  Non-urgent messages can be sent to your provider as well.   To learn more about what you can do with MyChart, go to [email or web address].    Your next appointment:   2 month(s)  Provider:   You will see one of the following Advanced Practice Providers on your designated Care Team:   Nicolasa Ducking, NP Eula Listen, PA-C Cadence [name], PA-C Charlsie Quest, NP Carlos Levering, NP

## 2024-01-24 NOTE — Progress Notes (Signed)
 Cardiology Office Note:  .   Date:  01/24/2024  ID:  Savannah Manning, DOB Mar 14, 1961, MRN [id number] PCP: Alba Cory, MD  [facility] Providers Cardiologist:  Debbe Odea, MD {  History of Present Illness: Savannah Manning   Savannah Manning is a 63 y.o. female  with a h/o HTN, HLD, nonobstructive CAD, and obesity who presents for follow-up for.    Her brother died of a massive MI at 72. Echo 02/2022 showed LVEF 55-60%. Myoview lexiscan 02/2022 showed no ischemia, low risk study.   Patient was last seen February 2025 and reported spasming in her chest.  Cardiac CTA showed a coronary calcium score of 200, 94th percentile for age and sex matched, mild proximal LAD stenosis 25 to 49%, overall mild nonobstructive CAD.  Today, the cardiac CTA was reviewed. The patient reports similar chest pain, however she feels it is from stress from work. Says her current contract will end later this year and is trying to find a new job. She still has similar chest pain and it goes into the lower back. Weight loss down to 211lbs.   Studies Reviewed: Savannah Manning   EKG Interpretation Date/Time:  Friday January 24 2024 11:13:42 EDT Ventricular Rate:  49 PR Interval:  172 QRS Duration:  88 QT Interval:  424 QTC Calculation: 383 R Axis:   2  Text Interpretation: Sinus bradycardia When compared with ECG of 24-Dec-2023 15:38, No significant change was found Confirmed by [name], [name] ([id number]) on 01/24/2024 11:15:42 AM     Cardiac CTA 12/2023 IMPRESSION: 1. Coronary calcium score of 200. This was 94th percentile for age and sex matched control.   2. Normal coronary origin with left dominance.   3. Mild proximal LAD stenosis (25-49%).   4. CAD-RADS 2. Mild non-obstructive CAD (25-49%). Consider non-atherosclerotic causes of chest pain. Consider preventive therapy and risk factor modification.    Echo 02/2022 1. Left ventricular ejection fraction, by estimation, is 55 to 60%. The  left ventricle has normal  function. The left ventricle has no regional  wall motion abnormalities. There is mild left ventricular hypertrophy.  Left ventricular diastolic parameters  were normal. The average left ventricular global longitudinal strain is  -20.6 %. The global longitudinal strain is normal.   2. Right ventricular systolic function is normal. The right ventricular  size is mildly enlarged.   3. The mitral valve is normal in structure. Mild mitral valve  regurgitation.   4. The aortic valve is tricuspid. Aortic valve regurgitation is not  visualized.    MPI 02/2022    No ST deviation was noted.   Pharmacological myocardial perfusion imaging study with no significant  ischemia Normal wall motion, EF estimated at 80% No EKG changes concerning for ischemia at peak stress or in recovery. CT coronary calcium scoring with mild coronary calcification, no significant aortic atherosclerosis Low risk scan      Physical Exam:   VS:  BP 115/82 (BP Location: Left Arm, Patient Position: Sitting, Cuff Size: Normal)   Pulse (!) 49   Ht 5\' 7"  (1.702 m)   Wt 211 lb 12.8 oz (96.1 kg)   SpO2 97%   BMI 33.17 kg/m    Wt Readings from Last 3 Encounters:  01/24/24 211 lb 12.8 oz (96.1 kg)  12/24/23 217 lb 6.4 oz (98.6 kg)  11/13/23 223 lb 3.2 oz (101.2 kg)    GEN: Well nourished, well developed in no acute distress NECK: No JVD; No carotid bruits CARDIAC: RR, bradycardia, no  murmurs, rubs, gallops RESPIRATORY:  Clear to auscultation without rales, wheezing or rhonchi  ABDOMEN: Soft, non-tender, non-distended EXTREMITIES:  No edema; No deformity   ASSESSMENT AND PLAN: .    Atypical chest pain She reports persistent atypical chest pain and lower back pain. She feels symptoms may be from stress at work. Cardiac CTA showed calcium score of 200 with mild nonobstructive disease. She will take ASA 81mg  daily. Continue Lipitor.   HTN BP today is normal, continue Maxzide 37.5-25mg  daily.   HLD LDL 103.continue  Crestor 40mg  daily. Goal<70 given CAD. Recommended lifestyle changes  Sinus bradycardia EKG shows SB with HR 49bpm. Normally HR in the 50s. During interview it stayed upper 40s.she is not on rate lower medication. I will check TSH and Mag and order a 1 week heart monitor. She denies dizziness, lightheadedness ro syncope.     Dispo: Follow-up in 2 months  Signed, [name] David Stall, PA-C

## 2024-01-25 LAB — TSH: TSH: 0.96 u[IU]/mL (ref 0.450–4.500)

## 2024-01-25 LAB — MAGNESIUM: Magnesium: 2 mg/dL (ref 1.6–2.3)

## 2024-02-03 ENCOUNTER — Encounter: Payer: Self-pay | Admitting: Family Medicine

## 2024-02-03 ENCOUNTER — Other Ambulatory Visit: Payer: Self-pay | Admitting: Family Medicine

## 2024-02-04 ENCOUNTER — Other Ambulatory Visit: Payer: Self-pay

## 2024-02-11 ENCOUNTER — Ambulatory Visit (INDEPENDENT_AMBULATORY_CARE_PROVIDER_SITE_OTHER): Payer: Self-pay | Admitting: Family Medicine

## 2024-02-11 ENCOUNTER — Encounter: Payer: Self-pay | Admitting: Family Medicine

## 2024-02-11 VITALS — BP 124/82 | Temp 97.4°F | Resp 18 | Ht 67.0 in | Wt 209.0 lb

## 2024-02-11 DIAGNOSIS — I10 Essential (primary) hypertension: Secondary | ICD-10-CM | POA: Diagnosis not present

## 2024-02-11 DIAGNOSIS — F321 Major depressive disorder, single episode, moderate: Secondary | ICD-10-CM | POA: Diagnosis not present

## 2024-02-11 DIAGNOSIS — F419 Anxiety disorder, unspecified: Secondary | ICD-10-CM

## 2024-02-11 DIAGNOSIS — R7303 Prediabetes: Secondary | ICD-10-CM

## 2024-02-11 DIAGNOSIS — E559 Vitamin D deficiency, unspecified: Secondary | ICD-10-CM

## 2024-02-11 DIAGNOSIS — E66811 Obesity, class 1: Secondary | ICD-10-CM

## 2024-02-11 DIAGNOSIS — J4520 Mild intermittent asthma, uncomplicated: Secondary | ICD-10-CM

## 2024-02-11 DIAGNOSIS — M1612 Unilateral primary osteoarthritis, left hip: Secondary | ICD-10-CM | POA: Diagnosis not present

## 2024-02-11 DIAGNOSIS — J302 Other seasonal allergic rhinitis: Secondary | ICD-10-CM

## 2024-02-11 DIAGNOSIS — I2510 Atherosclerotic heart disease of native coronary artery without angina pectoris: Secondary | ICD-10-CM

## 2024-02-11 DIAGNOSIS — E785 Hyperlipidemia, unspecified: Secondary | ICD-10-CM

## 2024-02-11 MED ORDER — BUSPIRONE HCL 5 MG PO TABS: 5.0000 mg | ORAL_TABLET | Freq: Two times a day (BID) | ORAL | 0 refills | Status: DC

## 2024-02-11 MED ORDER — FLUTICASONE PROPIONATE 50 MCG/ACT NA SUSP: 2.0000 | Freq: Every day | NASAL | 0 refills | Status: AC

## 2024-02-11 NOTE — Progress Notes (Signed)
 Name: Savannah Manning   MRN: [id number]    DOB: 1960-11-25   Date:02/11/2024       Progress Note  Subjective  Chief Complaint  Chief Complaint  Patient presents with   Medical Management of Chronic Issues   Hypertension   Obesity   Discussed the use of AI scribe software for clinical note transcription with the patient, who gave verbal consent to proceed.  History of Present Illness The patient is a 63 year old with obesity, prediabetes, and hypertension who presents for a follow-up visit.  She has experienced significant weight loss, dropping from 223 pounds to 209 pounds since her last visit three months ago. She has a history of obesity, having undergone gastric bypass surgery in 2005, which initially reduced her weight from 314 pounds to 164 pounds. Her weight began to increase again in 2010 when she started working from home. She has been on various weight loss medications, including Contrave, Wellbutrin, Saxenda, and currently Zepbound, which she started at 2.5 mg in December and is currently taking  10 mg dose. Her diet focuses on proteins and vegetables, avoiding grains, with occasional rice consumption.  She has a history of prediabetes and is on GLP-1 agonist. Her last blood work was done in August, and her sugar levels and kidney function are normal.   Her blood pressure has improved from 136/88 mmHg to 124/82 mmHg since her last visit. She is on blood pressure medication and continues to monitor her condition. She denies chest pain  She has mild intermittent asthma and takes Singulair every night. She has a cough and itchy eyes due to high pollen counts and uses Loratadine and nasal spray for relief.  She is experiencing emotional distress due to job insecurity, having been with her current employer for 30 years. She has difficulty sleeping and increased stress, especially with the potential of losing her job. She is actively looking for new employment opportunities.  She has a  family history of heart disease, with her brother having died at age 32. She recently saw a cardiologist and underwent an echocardiogram, MyoView scan, and cardiac CTA, which showed mild non-obstructive disease. 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/07/2018   Chronic pain of right knee 07/26/2017   Vitamin D deficiency 04/24/2017   B12 deficiency 04/24/2017   Abnormal mammogram of right breast 01/01/2017   Asthma, mild persistent 12/17/2016   History of shoulder surgery 10/12/2016   Primary osteoarthritis of left shoulder 06/14/2016   Incomplete tear of left rotator cuff 05/30/2016   Rotator cuff tendinitis 05/30/2016   History of bariatric surgery 05/04/2016   Allergic rhinitis 05/12/2015   Benign essential HTN 05/12/2015   Dyslipidemia 05/12/2015   Bilateral lower extremity edema 05/12/2015   Gastroesophageal reflux disease without esophagitis 05/12/2015   Migraine 05/12/2015   Plantar fasciitis 05/12/2015   Umbilical hernia without obstruction and without gangrene 05/12/2015   Morbid obesity with BMI of 40.0-44.9, adult (HCC) 02/13/2008  Past Surgical History:  Procedure Laterality Date   ABDOMINAL HYSTERECTOMY  2012   supra cervical   CHOLECYSTECTOMY     COLONOSCOPY WITH PROPOFOL N/A 12/18/2019   Procedure: COLONOSCOPY WITH PROPOFOL;  Surgeon: Toney Reil, MD;  Location: [facility];  Service:  Gastroenterology;  Laterality: N/A;   DILATION AND CURETTAGE OF UTERUS     ESOPHAGOGASTRODUODENOSCOPY (EGD) WITH PROPOFOL N/A 03/18/2020   Procedure: ESOPHAGOGASTRODUODENOSCOPY (EGD) WITH PROPOFOL;  Surgeon: Toney Reil, MD;  Location[facility];  Service: Gastroenterology;  Laterality: N/A;   GASTRIC BYPASS  2005   INDUCED ABORTION N/A 1997   patient was about 22 weeks   POLYPECTOMY  12/18/2019   Procedure: POLYPECTOMY;  Surgeon: Toney Reil, MD;  Location: [facility];  Service: Gastroenterology;;   SHOULDER ARTHROSCOPY WITH OPEN ROTATOR CUFF REPAIR Left 06/14/2016   Procedure: SHOULDER ARTHROSCOPY WITH OPEN ROTATOR CUFF REPAIR, DECOMPRESSION, EXCISION LOOSE BODY, DEBRIDEMENT;  Surgeon: Christena Flake, MD;  Location: [facility];  Service: Orthopedics;  Laterality: Left;    Family History  Problem Relation Age of Onset   Hypertension Mother    Cancer Father        prostate   Heart disease Brother    Breast cancer Cousin 40       mat cousin    Social History   Tobacco Use   Smoking status: Never   Smokeless tobacco: Never  Substance Use Topics   Alcohol use: No    Alcohol/week: 0.0 standard drinks of alcohol     Current Outpatient Medications:    acetaminophen (TYLENOL) 325 MG tablet, Take 325 mg by mouth every 6 (six) hours as needed. PRN, Disp: , Rfl:    albuterol (VENTOLIN HFA) 108 (90 Base) MCG/ACT inhaler, Inhale 2 puffs into the lungs every 6 (six) hours as needed for wheezing or shortness of breath., Disp: 18 g, Rfl: 0   amLODipine (NORVASC) 2.5 MG tablet, Take 1 tablet (2.5 mg total) by mouth daily., Disp: 90 tablet, Rfl: 1   aspirin EC 81 MG tablet, Take 1 tablet (81 mg total) by mouth daily. Swallow whole., Disp: , Rfl:    baclofen (LIORESAL) 10 MG tablet, Take 1 tablet (10 mg total) by mouth 3 (three) times daily as needed for muscle spasms., Disp: 60 each, Rfl: 0   buPROPion (WELLBUTRIN XL) 300 MG 24 hr tablet, Take 1 tablet (300 mg total)  by mouth daily., Disp: 90 tablet, Rfl: 1   cyanocobalamin (VITAMIN B12) 1000 MCG/ML injection, Inject into the muscle., Disp: , Rfl:    fluticasone (FLONASE) 50 MCG/ACT nasal spray, SHAKE LIQUID AND USE 2 SPRAYS IN EACH NOSTRIL DAILY, Disp: 48 g, Rfl: 0   loratadine (CLARITIN) 10 MG tablet, Take 1 tablet (10 mg total) by mouth daily., Disp: 90 tablet, Rfl: 1   montelukast (SINGULAIR) 10 MG tablet, Take 1 tablet (10 mg total) by mouth at bedtime., Disp: 90 tablet, Rfl: 1   nystatin cream (MYCOSTATIN), Apply 1 Application topically 2 (two) times daily., Disp: 30 g, Rfl: 5   omeprazole (PRILOSEC) 40 MG capsule, Take 1 capsule (40 mg total) by mouth daily., Disp: 90 capsule, Rfl: 0   rosuvastatin (CRESTOR) 40 MG tablet, Take 1 tablet (40 mg total) by mouth daily., Disp: 90 tablet, Rfl: 1   tirzepatide (ZEPBOUND) 10 MG/0.5ML Pen, Inject 10 mg into the skin once a week., Disp: 2 mL, Rfl: 0   tirzepatide (ZEPBOUND) 5 MG/0.5ML Pen, Inject 5 mg into the skin once  a week., Disp: 2 mL, Rfl: 0   tirzepatide (ZEPBOUND) 7.5 MG/0.5ML Pen, Inject 7.5 mg into the skin once a week., Disp: 2 mL, Rfl: 0   triamterene-hydrochlorothiazide (MAXZIDE-25) 37.5-25 MG tablet, Take 1 tablet by mouth daily. In place of HCTZ 25 , BP medication, Disp: 90 tablet, Rfl: 1   Vitamin D, Ergocalciferol, (DRISDOL) 1.25 MG (50000 UNIT) CAPS capsule, Take 1 capsule (50,000 Units total) by mouth every 7 (seven) days., Disp: 12 capsule, Rfl: 1   furosemide (LASIX) 20 MG tablet, Take 1 tablet (20 mg total) by mouth daily as needed. For leg swelling, Disp: 30 tablet, Rfl: 5   metoprolol tartrate (LOPRESSOR) 50 MG tablet, Take 1 tablet (50 mg total) by mouth once for 1 dose. Take 2 hours prior to your procedure., Disp: 1 tablet, Rfl: 0  Allergies  Allergen Reactions   Ace Inhibitors     angioedema   Codeine Swelling   Valsartan Swelling    I personally reviewed active problem list, medication list, allergies with the patient/caregiver  today.   ROS  Ten systems reviewed and is negative except as mentioned in HPI    Objective Physical Exam VITALS: BP- 124/82 MEASUREMENTS: Weight- 209.  Vitals:   02/11/24 1506  BP: 124/82  Resp: 18  Temp: (!) 97.4 F (36.3 C)  Weight: 209 lb (94.8 kg)  Height: 5\' 7"  (1.702 m)    Body mass index is 32.73 kg/m.  Recent Results (from the past 2160 hours)  POCT urinalysis dipstick     Status: Abnormal   Collection Time: 11/13/23  3:46 PM  Result Value Ref Range   Color, UA yellow    Clarity, UA clear    Glucose, UA Negative Negative   Bilirubin, UA small    Ketones, UA small    Spec Grav, UA >=1.030 (A) 1.010 - 1.025   Blood, UA Negative    pH, UA 5.0 5.0 - 8.0   Protein, UA Negative Negative   Urobilinogen, UA 1.0 0.2 or 1.0 E.U./dL   Nitrite, UA Negative    Leukocytes, UA Negative Negative   Appearance yellow    Odor normal   Vitamin B12     Status: None   Collection Time: 11/29/23  3:20 PM  Result Value Ref Range   Vitamin B-12 358 180 - 914 pg/mL    Comment: (NOTE) This assay is not validated for testing neonatal or myeloproliferative syndrome specimens for Vitamin B12 levels. Performed at [facility], [address] [place], [place] [address]   Iron and TIBC     Status: None   Collection Time: 11/29/23  3:20 PM  Result Value Ref Range   Iron 78 28 - 170 ug/dL   TIBC 469 629 - 528 ug/dL   Saturation Ratios 20 10.4 - 31.8 %   UIBC 315 ug/dL    Comment: Performed at [facility], [address] [place], [place] [address]  Ferritin     Status: None   Collection Time: 11/29/23  3:20 PM  Result Value Ref Range   Ferritin 123 11 - 307 ng/mL    Comment: Performed at [facility], [address] [place], [place] [address]  CBC     Status: None   Collection Time: 11/29/23  3:20 PM  Result Value Ref Range   WBC 5.6 4.0 - 10.5 K/uL   RBC 4.42 3.87 - 5.11 MIL/uL   Hemoglobin 13.5 12.0 - 15.0 g/dL   HCT 72.5 36.6 -  46.0 %   MCV  88.0 80.0 - 100.0 fL   MCH 30.5 26.0 - 34.0 pg   MCHC 34.7 30.0 - 36.0 g/dL   RDW 25.3 66.4 - 40.3 %   Platelets 235 150 - 400 K/uL   nRBC 0.0 0.0 - 0.2 %    Comment: Performed at [facility], [address] [place], [place] [address]  Basic metabolic panel     Status: None   Collection Time: 12/24/23  4:10 PM  Result Value Ref Range   Glucose 92 70 - 99 mg/dL   BUN 15 8 - 27 mg/dL   Creatinine, Ser 9.56 0.57 - 1.00 mg/dL   eGFR 76 >38 VF/IEP/3.29   BUN/Creatinine Ratio 17 12 - 28   Sodium 141 134 - 144 mmol/L   Potassium 4.5 3.5 - 5.2 mmol/L   Chloride 100 96 - 106 mmol/L   CO2 25 20 - 29 mmol/L   Calcium 9.9 8.7 - 10.3 mg/dL  TSH     Status: None   Collection Time: 01/24/24 11:38 AM  Result Value Ref Range   TSH 0.960 0.450 - 4.500 uIU/mL  Magnesium     Status: None   Collection Time: 01/24/24 11:38 AM  Result Value Ref Range   Magnesium 2.0 1.6 - 2.3 mg/dL    Diabetic Foot Exam:     PHQ2/9:    02/11/2024    3:18 PM 11/13/2023    3:22 PM 09/13/2023   10:28 AM 06/11/2023   11:37 AM 04/15/2023    7:48 AM  Depression screen PHQ 2/9  Decreased Interest 1 0 0 0 0  Down, Depressed, Hopeless 2 0 0 0 0  PHQ - 2 Score 3 0 0 0 0  Altered sleeping 3 0 1 3 3   Tired, decreased energy 3 0 1 3 3   Change in appetite 1 0 0 0 0  Feeling bad or failure about yourself  0 0 0 0 0  Trouble concentrating 3 0 1 3 3   Moving slowly or fidgety/restless 0 0 0 0 0  Suicidal thoughts 0 0 0 0 0  PHQ-9 Score 13 0 3 9 9   Difficult doing work/chores Not difficult at all Not difficult at all Not difficult at all Not difficult at all     phq 9 is positive  Fall Risk:    02/11/2024    3:07 PM 11/13/2023    3:22 PM 09/13/2023   10:22 AM 06/11/2023   11:37 AM 04/15/2023    7:39 AM  Fall Risk   Falls in the past year? 0 0 0 0 0  Number falls in past yr: 0 0   0  Injury with Fall? 0 0   0  Risk for fall due to :  No Fall Risks No Fall Risks No Fall Risks No Fall Risks  Follow up Falls  evaluation completed Falls prevention discussed;Education provided;Falls evaluation completed Falls prevention discussed;Education provided;Falls evaluation completed Falls prevention discussed Falls prevention discussed     Assessment & Plan Obesity BMI below 35, weight reduced from 223 lbs to 209 lbs. History of gastric bypass in 2005. Current management includes Zepbound and dietary changes. - Increase Zepbound dose to 12.5 mg. - Continue dietary modifications focusing on protein intake and consider intermittent fasting.  Prediabetes No recent blood work, but no concern due to ongoing weight loss and medication management. - Continue current diabetes medications. - Plan for future blood work to monitor glucose levels.  Hypertension Improved  from 136/88 mmHg to 124/82 mmHg.  - Continue current blood pressure medication. - amlodipine 2.5 mg - Maxzide 25  Dyslipidemia Managed with rosuvastatin. No adverse effects reported. - Continue rosuvastatin.  Coronary Artery Disease without angina Family history of heart disease. Coronary calcium score of 200 indicating mild non-obstructive disease. Management includes weight loss, blood pressure control, and aspirin therapy. - Continue weight loss efforts. - Maintain blood pressure control. - Take a daily baby aspirin. -  Continue statin therapy   Anxiety and Depression Significant stress and anxiety related to job insecurity and financial concerns. Difficulty sleeping. - Prescribe Buspar for anxiety management since unable to tolerate SSRI and is already taking Wellbutrin  - Monitor emotional well-being and sleep patterns. - Advise on financial planning and stress management.  Osteoarthritis Particularly in the left hip. Recent pain resolved with Tylenol and rest. - Use Tylenol for pain management as needed. - Encourage physical activity within tolerance.  Asthma Mild intermittent asthma managed with Singulair. Allergy symptoms  managed with loratadine and nasal spray. - Continue Singulair (montelukast) every night. - Use loratadine and nasal spray daily for allergy symptoms.  Gastroesophageal Reflux Disease (GERD) Managed with omeprazole as needed. - Continue omeprazole as needed.  General Health Maintenance Vitamin D supplementation ongoing. - Continue vitamin D supplementation.  Follow-up Advised to continue current management plans and medication regimens. - Schedule follow-up appointment for blood work and monitoring of conditions.

## 2024-02-12 DIAGNOSIS — R001 Bradycardia, unspecified: Secondary | ICD-10-CM | POA: Diagnosis not present

## 2024-02-20 DIAGNOSIS — R001 Bradycardia, unspecified: Secondary | ICD-10-CM | POA: Diagnosis not present

## 2024-02-24 ENCOUNTER — Encounter: Payer: Self-pay | Admitting: Emergency Medicine

## 2024-03-23 ENCOUNTER — Encounter: Payer: Self-pay | Admitting: Oncology

## 2024-03-23 ENCOUNTER — Other Ambulatory Visit (HOSPITAL_COMMUNITY): Payer: Self-pay

## 2024-03-23 ENCOUNTER — Ambulatory Visit: Payer: Self-pay

## 2024-03-23 ENCOUNTER — Telehealth: Payer: Self-pay | Admitting: Pharmacy Technician

## 2024-03-23 NOTE — Telephone Encounter (Signed)
 Pharmacy Patient Advocate Encounter   Received notification from CoverMyMeds that prior authorization for Zepbound  2.5MG /0.5ML pen-injectors is due for renewal.   Insurance verification completed.   The patient is insured through [facility].  Action: Medication is now available without a prior authorization.

## 2024-03-23 NOTE — Telephone Encounter (Signed)
 Chief Complaint: lightheadedness Symptoms: above plus H/A, blurry vision Frequency: lightheadedness for 1 month, blurry vision today Pertinent Negatives: Patient denies CP, SOB, palpitations, LOC, vomiting, bleeding, one-sided weakness, numbness, unsteady gait Disposition: [] ED /[] Urgent Care (no appt availability in office) / [x] Appointment(In office/virtual)/ []  [facility]/ [] Home Care/ [] Refused Recommended Disposition /[] [place] [facility]/ []  Follow-up with PCP Additional Notes: Pt reports intermittent lightheadedness for 1 month. Pt was sitting today at work using the computer when she looked down and then looked up and became lightheaded and developed blurry vision. Quickly the feeling passed and the pt's vision normalized. Pt states she is walking normally. Pt states she has never had blurry vision before today. Pt reports a "slight" H/A. Pt has a hx of HTN and takes amlodipine  with no missed doses. Pt states she does not check her BP. Pt stats she is experiencing stress at work. RN advised pt she should be seen within 3 days and scheduled her for Wednesday. RN advised the pt if she develops a severe headache, vomiting, severe dizziness, an unsteady gait, one-sided numbness, CP, SOB, or palpitations she needs to go to the ED. Pt verbalized understanding.     Copied from CRM [id number]. Topic: Clinical - Red Word Triage >> Mar 23, 2024  4:57 PM Turkey B wrote: [facility] that prompted transfer to Nurse Triage: pt has blurred vision and lightheadedness and slight headache Reason for Disposition  [1] MILD dizziness (e.g., walking normally) AND [2] has NOT been evaluated by doctor (or NP/PA) for this  (Exception: Dizziness caused by heat exposure, sudden standing, or poor fluid intake.)  Answer Assessment - Initial Assessment Questions 1. DESCRIPTION: "Describe your dizziness."     Lightheadedness, looked down and looked up quickly 2. LIGHTHEADED: "Do you feel lightheaded?"  (e.g., somewhat faint, woozy, weak upon standing)     yes 3. VERTIGO: "Do you feel like either you or the room is spinning or tilting?" (i.e. vertigo)     No  4. SEVERITY: "How bad is it?"  "Do you feel like you are going to faint?" "Can you stand and walk?"   - MILD: Feels slightly dizzy, but walking normally.   - MODERATE: Feels unsteady when walking, but not falling; interferes with normal activities (e.g., school, work).   - SEVERE: Unable to walk without falling, or requires assistance to walk without falling; feels like passing out now.      Pt thought she was going to faint during today's episode at work (lightheaded, blurry vision), was at work and sitting - otherwise none right now, mild 5. ONSET:  "When did the dizziness begin?"     "Been having issues with that before because I have HTN" 6. AGGRAVATING FACTORS: "Does anything make it worse?" (e.g., standing, change in head position)     Looking up  7. HEART RATE: "Can you tell me your heart rate?" "How many beats in 15 seconds?"  (Note: not all patients can do this)       N/a 8. CAUSE: "What do you think is causing the dizziness?"     Possibly HTN 9. RECURRENT SYMPTOM: "Have you had dizziness before?" If Yes, ask: "When was the last time?" "What happened that time?"     yes 10. OTHER SYMPTOMS: "Do you have any other symptoms?" (e.g., fever, chest pain, vomiting, diarrhea, bleeding)       Episode today at work - "vision got blurred", slight H/A, lightheadedness. Felt lightheaded at her desk and while walking down  the stairs. Lightheadedness intermittently for a month. Not every day or even every week. Denies dizziness at this time. Taking amlodipine  every day. Denies CP or SOB. Pt states her vision is clear now. Pt states she feels a "strain" when she opens her eyes wide." Head feels tight. Denies palpitations. Endorses stress at work. Denies taking her BP at home. Denies fever."I did feel like I have to vomit earlier but that was  because I take Zepbound ", denies vomiting. Denies bleeding or diarrhea. Denies one-sided weakness.  Protocols used: Dizziness - Lightheadedness-A-AH

## 2024-03-24 ENCOUNTER — Other Ambulatory Visit (HOSPITAL_COMMUNITY): Payer: Self-pay

## 2024-03-25 ENCOUNTER — Ambulatory Visit: Payer: Self-pay

## 2024-03-25 ENCOUNTER — Encounter: Payer: Self-pay | Admitting: Family Medicine

## 2024-03-25 ENCOUNTER — Other Ambulatory Visit: Payer: Self-pay

## 2024-03-25 ENCOUNTER — Emergency Department
Admission: EM | Admit: 2024-03-25 | Discharge: 2024-03-25 | Disposition: A | Discharge status: Home / Self Care | Attending: Emergency Medicine | Admitting: Emergency Medicine

## 2024-03-25 ENCOUNTER — Ambulatory Visit: Admitting: Family Medicine

## 2024-03-25 ENCOUNTER — Encounter: Payer: Self-pay | Admitting: Emergency Medicine

## 2024-03-25 DIAGNOSIS — I10 Essential (primary) hypertension: Secondary | ICD-10-CM | POA: Insufficient documentation

## 2024-03-25 DIAGNOSIS — J45909 Unspecified asthma, uncomplicated: Secondary | ICD-10-CM | POA: Diagnosis not present

## 2024-03-25 LAB — CBC
HCT: 35.8 % — ABNORMAL LOW (ref 36.0–46.0)
Hemoglobin: 12.5 g/dL (ref 12.0–15.0)
MCH: 30.9 pg (ref 26.0–34.0)
MCHC: 34.9 g/dL (ref 30.0–36.0)
MCV: 88.6 fL (ref 80.0–100.0)
Platelets: 214 10*3/uL (ref 150–400)
RBC: 4.04 MIL/uL (ref 3.87–5.11)
RDW: 12.2 % (ref 11.5–15.5)
WBC: 4.5 10*3/uL (ref 4.0–10.5)
nRBC: 0 % (ref 0.0–0.2)

## 2024-03-25 LAB — BASIC METABOLIC PANEL WITH GFR
Anion gap: 8 (ref 5–15)
BUN: 17 mg/dL (ref 6–20)
CO2: 26 mmol/L (ref 22–32)
Calcium: 9.1 mg/dL (ref 8.9–10.3)
Chloride: 104 mmol/L (ref 98–111)
Creatinine, Ser: 0.86 mg/dL (ref 0.44–1.00)
GFR, Estimated: >60 mL/min (ref >60)
Glucose, Bld: 88 mg/dL (ref 70–99)
Potassium: 4 mmol/L (ref 3.5–5.1)
Sodium: 138 mmol/L (ref 135–145)

## 2024-03-25 MED ORDER — HYDROXYZINE HCL 25 MG PO TABS: 25.0000 mg | ORAL_TABLET | Freq: Three times a day (TID) | ORAL | 0 refills | Status: DC | PRN

## 2024-03-25 MED ORDER — HYDROXYZINE HCL 25 MG PO TABS
25.0000 mg | ORAL_TABLET | Freq: Once | ORAL | Status: AC
  Administered 2024-03-25: 25 mg via ORAL
  Filled 2024-03-25: qty 1

## 2024-03-25 NOTE — ED Notes (Signed)
EDP was at bedside. 

## 2024-03-25 NOTE — Telephone Encounter (Signed)
 Chief Complaint: elevated Bp Symptoms: elevated BP, headache, blurred vision Frequency: this morning Pertinent Negatives: Patient denies sob or chest pain Disposition: [x] ED /[] Urgent Care (no appt availability in office) / [] Appointment(In office/virtual)/ []  [facility] [place] [facility]/ [] Home Care/ [] Refused Recommended Disposition /[] [place] [facility]/ [x]  Follow-up with PCP Additional Notes: Pt states that she woke up with elevated BP.   Pt states that she is also experiencing a headache and blurred vision currently. Pt states that her BP was elevated on Monday as well. Pt denies any cardiac symptoms. Pt instructed Ed, states she will find someone to drive her,instructed when to call EMS.   Copied from CRM [id number]. Topic: Clinical - Red Word Triage >> Mar 25, 2024  8:39 AM Rosamond Comes wrote: Red Word that prompted transfer to Nurse Triage: patient has blurry vision,headache, BP 158/111 Reason for Disposition  [1] Systolic BP  >= 160 OR Diastolic >= 100 AND [2] cardiac (e.g., breathing difficulty, chest pain) or neurologic symptoms (e.g., new-onset blurred or double vision, unsteady gait)  Protocols used: Blood Pressure - High-A-AH

## 2024-03-25 NOTE — ED Triage Notes (Signed)
 Patient to ED via POV for hypertension. States she has been having head pressure and blurred vision since Monday. Has appointment with PCP this afternoon for same. HX of HTNa and takes meds for same.

## 2024-03-25 NOTE — ED Provider Notes (Signed)
 [facility] Provider Note    Event Date/Time   First MD Initiated Contact with Patient 03/25/24 0935     (approximate)  History   Chief Complaint: Hypertension  HPI  Savannah Manning is a 63 y.o. female with a past medical history of anemia, asthma, gastric reflux, hypertension, presents to the emergency department for high blood pressure and headache/blurred vision.  According to the patient she has been experiencing a slight headache recently has been checking her blood pressure and noticed that has been running in the 150s over 100 resewn.  Patient saw her blood pressure spiked to 159 this morning she became concerned so she came to the emergency department for evaluation.  Patient states she called her doctor and they recommended she come to the ER.  Patient has an appointment with her doctor this afternoon for the same.  Patient is currently on Maxide as well as amlodipine , states she has been taking these as prescribed.  Physical Exam   Triage Vital Signs: ED Triage Vitals  Encounter Vitals Group     BP 03/25/24 0931 (!) 159/99     Systolic BP Percentile --      Diastolic BP Percentile --      Pulse Rate 03/25/24 0931 60     Resp 03/25/24 0931 17     Temp 03/25/24 0931 97.8 F (36.6 C)     Temp Source 03/25/24 0931 Oral     SpO2 03/25/24 0931 100 %     Weight 03/25/24 0932 211 lb (95.7 kg)     Height 03/25/24 0932 5\' 7"  (1.702 m)     Head Circumference --      Peak Flow --      Pain Score 03/25/24 0932 1     Pain Loc --      Pain Education --      Exclude from Growth Chart --     Most recent vital signs: Vitals:   03/25/24 0931  BP: (!) 159/99  Pulse: 60  Resp: 17  Temp: 97.8 F (36.6 C)  SpO2: 100%    General: Awake, no distress.  CV:  Good peripheral perfusion.  Regular rate and rhythm  Resp:  Normal effort.  Equal breath sounds bilaterally.  Abd:  No distention.  ED Results / Procedures / Treatments   MEDICATIONS ORDERED IN  ED: Medications  hydrOXYzine (ATARAX) tablet 25 mg (25 mg Oral Given 03/25/24 1001)     IMPRESSION / MDM / ASSESSMENT AND PLAN / ED COURSE  I reviewed the triage vital signs and the nursing notes.  Patient's presentation is most consistent with acute presentation with potential threat to life or bodily function.  Patient presents to the emergency department with high blood pressure.  Patient states blood pressure readings at home this morning were close to 160/100 sometimes 110.  Patient called her doctor and they recommended she come to the emergency department.  Patient already has an appointment with her doctor this afternoon.  Here the patient is very well-appearing, vital signs reassuring besides slight hypertension.  I had a long discussion with the patient regarding blood pressure, she states she is under a lot of stress recently with her job and believes a lot of her issues resolved from this.  We will check basic labs, I also discussed with the patient a trial of hydroxyzine we will dose 1 dose in the emergency department to see if this helps with the patient's stress/anxiety and ultimately with her  blood pressure.  Patient agreeable to plan of care.  No concerning findings on physical exam or history.  CBC is normal, chemistry is normal.  Vital signs show decreasing blood pressure currently down to 137 systolic.  We will prescribe hydroxyzine for the patient to be used if needed.  Patient will follow-up with her doctor this afternoon as scheduled.  FINAL CLINICAL IMPRESSION(S) / ED DIAGNOSES   Hypertension   Note:  This document was prepared using Dragon voice recognition software and may include unintentional dictation errors.   Ruth Cove, MD 03/25/24 1123

## 2024-03-26 ENCOUNTER — Other Ambulatory Visit: Payer: Self-pay

## 2024-03-26 NOTE — Progress Notes (Signed)
 Not seen

## 2024-03-27 ENCOUNTER — Ambulatory Visit: Payer: BC Managed Care – PPO | Admitting: Oncology

## 2024-03-27 ENCOUNTER — Other Ambulatory Visit: Payer: Self-pay | Admitting: Family Medicine

## 2024-03-27 ENCOUNTER — Inpatient Hospital Stay: Payer: BC Managed Care – PPO | Admitting: Oncology

## 2024-03-27 ENCOUNTER — Inpatient Hospital Stay: Payer: BC Managed Care – PPO

## 2024-03-27 ENCOUNTER — Other Ambulatory Visit: Payer: BC Managed Care – PPO

## 2024-03-27 MED ORDER — TIRZEPATIDE-WEIGHT MANAGEMENT 10 MG/0.5ML ~~LOC~~ SOLN: 10.0000 mg | SUBCUTANEOUS | 0 refills | Status: DC

## 2024-04-01 ENCOUNTER — Ambulatory Visit: Admitting: Medical

## 2024-04-23 ENCOUNTER — Ambulatory Visit: Attending: Medical | Admitting: Medical

## 2024-04-23 ENCOUNTER — Encounter: Payer: Self-pay | Admitting: Medical

## 2024-04-23 VITALS — BP 116/74 | HR 71 | Ht 67.0 in | Wt 196.6 lb

## 2024-04-23 DIAGNOSIS — I10 Essential (primary) hypertension: Secondary | ICD-10-CM | POA: Diagnosis not present

## 2024-04-23 DIAGNOSIS — R001 Bradycardia, unspecified: Secondary | ICD-10-CM

## 2024-04-23 DIAGNOSIS — R0789 Other chest pain: Secondary | ICD-10-CM

## 2024-04-23 DIAGNOSIS — E782 Mixed hyperlipidemia: Secondary | ICD-10-CM

## 2024-04-23 NOTE — Patient Instructions (Signed)
 Medication Instructions:   Your physician recommends that you continue on your current medications as directed. Please refer to the Current Medication list given to you today.   *If you need a refill on your cardiac medications before your next appointment, please call your pharmacy*  Lab Work: No labs ordered today   If you have labs (blood work) drawn today and your tests are completely normal, you will receive your results only by: MyChart Message (if you have MyChart) OR A paper copy in the mail If you have any lab test that is abnormal or we need to change your treatment, we will call you to review the results.  Testing/Procedures:  No test ordered today   Follow-Up: At [facility], you and your health needs are our priority.  As part of our continuing mission to provide you with exceptional heart care, our providers are all part of one team.  This team includes your primary Cardiologist (physician) and Advanced Practice Providers or APPs (Physician Assistants and Nurse Practitioners) who all work together to provide you with the care you need, when you need it.  Your next appointment:   12 month(s)  Provider:   You may see Constancia Delton, MD or one of the following Advanced Practice Providers on your designated Care Team:   Laneta Pintos, NP Gildardo Labrador, PA-C Varney Gentleman, PA-C Cadence Raytown, PA-C Ronald Cockayne, NP Morey Ar, NP    We recommend signing up for the patient portal called MyChart.  Sign up information is provided on this After Visit Summary.  MyChart is used to connect with patients for Virtual Visits (Telemedicine).  Patients are able to view lab/test results, encounter notes, upcoming appointments, etc.  Non-urgent messages can be sent to your provider as well.   To learn more about what you can do with MyChart, go to [email or web address].

## 2024-04-23 NOTE — Progress Notes (Signed)
  Cardiology Office Note   Date:  04/23/2024  ID:  Savannah Manning, DOB [birth year], MRN [id number] PCP: Arleen Lacer, MD  [facility] Providers Cardiologist:  Constancia Delton, MD   History of Present Illness Savannah Manning is a 63 y.o. female  with a h/o HTN, HLD, nonobstructive CAD, and obesity who presents for follow-up for chest pain.    Her brother died of a massive MI at 31. Echo 02/2022 showed LVEF 55-60%. Myoview  lexiscan  02/2022 showed no ischemia, low risk study.    Patient was last seen February 2025 and reported spasming in her chest.  Cardiac CTA showed a coronary calcium  score of 200, 94th percentile for age and sex matched, mild proximal LAD stenosis 25 to 49%, overall mild nonobstructive CAD.  The patient was last seen 01/2024 reporting atypical chest pain suspected from stress at work. HR was 49bpm and labs with heart monitor were ordered.   Today, the patient reports work is still stressful. She denies any further chest pain. She did end up keeping a job and went to another department. No shortness of breath. BP at home has been 110-120s systolics.   Studies Reviewed EKG Interpretation Date/Time:  Thursday April 23 2024 15:59:03 EDT Ventricular Rate:  71 PR Interval:  172 QRS Duration:  80 QT Interval:  402 QTC Calculation: 436 R Axis:   19  Text Interpretation: Normal sinus rhythm Normal ECG When compared with ECG of 24-Jan-2024 11:13, No significant change was found Confirmed by Gennaro Khat, Mistee Soliman ([id number]) on 04/23/2024 4:08:24 PM    Heart monitor 02/2024 Conclusion Average heart rate 65 bpm, range 44-140. No evidence of high degree AV block. No atrial fibrillation or atrial flutter. No significant or sustained arrhythmias.  Cardiac CTA 01/2024   IMPRESSION: 1. Coronary calcium  score of 200. This was 94th percentile for age and sex matched control.   2. Normal coronary origin with left dominance.   3. Mild proximal LAD stenosis (25-49%).   4.  CAD-RADS 2. Mild non-obstructive CAD (25-49%). Consider non-atherosclerotic causes of chest pain. Consider preventive therapy and risk factor modification.        Physical Exam VS:  BP 116/74   Pulse 71   Ht 5' 7 (1.702 m)   Wt 196 lb 9.6 oz (89.2 kg)   SpO2 99%   BMI 30.79 kg/m    Wt Readings from Last 3 Encounters:  04/23/24 196 lb 9.6 oz (89.2 kg)  03/25/24 211 lb (95.7 kg)  02/11/24 209 lb (94.8 kg)    GEN: Well nourished, well developed in no acute distress NECK: No JVD; No carotid bruits CARDIAC: RRR, no murmurs, rubs, gallops RESPIRATORY:  Clear to auscultation without rales, wheezing or rhonchi  ABDOMEN: Soft, non-tender, non-distended EXTREMITIES:  No edema; No deformity   ASSESSMENT AND PLAN  Atypical chest pain Cardiac CTA showed coronary calcium  score of 200, 94th percentile, mild pLAD stenosis.  She reports improved chest pain. Says stress from work has improved, but still present. No further work-up at this time.   HTN BP today is normal, continue Maxzide 37.2-25mg  daily.   HLD LDL 103. Continue Crestor  40mg  daily, goal<70. Lifestyle changes recommended.   Sinus bradycardia Heart monitor showed NSR with avg HR in the 70s. EKG shows NSR with HR in the 70s.       Dispo: Follow-up in 1 year  Signed, Jenel Gierke Rebekah Canada, PA-C

## 2024-05-20 ENCOUNTER — Telehealth: Payer: Self-pay | Admitting: Pharmacy Technician

## 2024-05-20 ENCOUNTER — Encounter: Payer: Self-pay | Admitting: Oncology

## 2024-05-20 ENCOUNTER — Ambulatory Visit (INDEPENDENT_AMBULATORY_CARE_PROVIDER_SITE_OTHER): Admitting: Family Medicine

## 2024-05-20 ENCOUNTER — Other Ambulatory Visit (HOSPITAL_COMMUNITY): Payer: Self-pay

## 2024-05-20 ENCOUNTER — Encounter: Payer: Self-pay | Admitting: Family Medicine

## 2024-05-20 ENCOUNTER — Other Ambulatory Visit: Payer: Self-pay

## 2024-05-20 DIAGNOSIS — I10 Essential (primary) hypertension: Secondary | ICD-10-CM

## 2024-05-20 DIAGNOSIS — J4520 Mild intermittent asthma, uncomplicated: Secondary | ICD-10-CM

## 2024-05-20 DIAGNOSIS — I2510 Atherosclerotic heart disease of native coronary artery without angina pectoris: Secondary | ICD-10-CM

## 2024-05-20 DIAGNOSIS — R600 Localized edema: Secondary | ICD-10-CM

## 2024-05-20 DIAGNOSIS — E538 Deficiency of other specified B group vitamins: Secondary | ICD-10-CM

## 2024-05-20 DIAGNOSIS — R7303 Prediabetes: Secondary | ICD-10-CM

## 2024-05-20 DIAGNOSIS — E785 Hyperlipidemia, unspecified: Secondary | ICD-10-CM | POA: Diagnosis not present

## 2024-05-20 DIAGNOSIS — E559 Vitamin D deficiency, unspecified: Secondary | ICD-10-CM

## 2024-05-20 DIAGNOSIS — F321 Major depressive disorder, single episode, moderate: Secondary | ICD-10-CM | POA: Diagnosis not present

## 2024-05-20 DIAGNOSIS — K219 Gastro-esophageal reflux disease without esophagitis: Secondary | ICD-10-CM

## 2024-05-20 DIAGNOSIS — Z1231 Encounter for screening mammogram for malignant neoplasm of breast: Secondary | ICD-10-CM

## 2024-05-20 MED ORDER — SERTRALINE HCL 50 MG PO TABS: 50.0000 mg | ORAL_TABLET | Freq: Every day | ORAL | 0 refills | Status: DC

## 2024-05-20 MED ORDER — MONTELUKAST SODIUM 10 MG PO TABS: 10.0000 mg | ORAL_TABLET | Freq: Every day | ORAL | 1 refills | Status: DC

## 2024-05-20 MED ORDER — AMLODIPINE BESYLATE 2.5 MG PO TABS: 2.5000 mg | ORAL_TABLET | Freq: Every day | ORAL | 1 refills | Status: DC

## 2024-05-20 MED ORDER — ROSUVASTATIN CALCIUM 40 MG PO TABS: 40.0000 mg | ORAL_TABLET | Freq: Every day | ORAL | 1 refills | Status: DC

## 2024-05-20 MED ORDER — ZEPBOUND 10 MG/0.5ML ~~LOC~~ SOAJ
10.0000 mg | SUBCUTANEOUS | 2 refills | Status: DC
  Filled 2024-05-20 – 2024-05-28 (×2): qty 2, 28d supply, fill #0

## 2024-05-20 NOTE — Telephone Encounter (Signed)
 Pharmacy Patient Advocate Encounter   Received notification from CoverMyMeds that prior authorization for Zepbound  10MG /0.5ML pen-injectors is required/requested.   Insurance verification completed.   The patient is insured through [facility] .   Per test claim: PA required; PA submitted to above mentioned insurance via CoverMyMeds Key/confirmation #/EOC [id number] Status is pending

## 2024-05-20 NOTE — Progress Notes (Signed)
 Name: Savannah Manning   MRN: [id number]    DOB: [birth year]   Date:05/20/2024       Progress Note  Subjective  Chief Complaint  Chief Complaint  Patient presents with   Medical Management of Chronic Issues   Discussed the use of AI scribe software for clinical note transcription with the patient, who gave verbal consent to proceed.  History of Present Illness Savannah Manning is a 63 year old female with hypertension and coronary artery disease who presents for a three-month follow-up.  She has a history of hypertension and is taking triamterene  HCTZ 37.5/25 mg and amlodipine . Attempts to reduce triamterene  HCTZ led to increased swelling. No chest pain or palpitations. She experiences dizziness, especially with head movement or looking down, but her home blood pressure readings are around 136. She does not monitor her blood sugar.  She has a history of coronary artery disease and is taking blood pressure medication and statin therapy. A cardiac CTA confirmed the diagnosis with a high score of 200 in the 94th percentile. She denies angina, pain, or symptoms of a heart attack.  She is on rosuvastatin  40 mg for dyslipidemia and reports no muscle aches or chest pain. Furosemide  is used as needed for leg swelling, based on necessity rather than daily.  She experiences depression and is on Wellbutrin  300 mg, but notices no significant difference. She is under significant stress due to her brother's recent stroke, financial strain, and job stress. As a single parent with a child in school, she feels tired all the time and has a poor appetite, which she attributes to her medication. She also takes Buspar  and hydroxyzine  as needed for anxiety.  She is on Mounjaro  for weight loss and has lost seven pounds recently. The medication is administered via injection pen, covered by insurance except for a $200 copay, which she pays using a FlexCard.  She takes omeprazole  as needed for stomach issues but reports no  current need for it. She is also on montelukast  (Singulair ) for asthma and reports no wheezing, cough, or shortness of breath. She is not currently receiving B12 injections as her levels were previously high.    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/07/2018   Chronic pain of right knee 07/26/2017   Vitamin D  deficiency 04/24/2017   B12 deficiency 04/24/2017   Abnormal mammogram of right breast 01/01/2017   Asthma, mild persistent 12/17/2016   History of shoulder surgery 10/12/2016   Primary osteoarthritis of left shoulder 06/14/2016   Incomplete tear of left rotator cuff 05/30/2016   Rotator cuff tendinitis 05/30/2016   History of bariatric surgery 05/04/2016   Allergic rhinitis 05/12/2015   Benign essential HTN 05/12/2015   Dyslipidemia 05/12/2015   Bilateral lower extremity edema 05/12/2015   Gastroesophageal reflux disease without esophagitis 05/12/2015   Migraine 05/12/2015   Plantar fasciitis 05/12/2015   Umbilical hernia without obstruction and without gangrene 05/12/2015   Morbid obesity with BMI of 40.0-44.9, adult (HCC) 02/13/2008    Past Surgical History:  Procedure Laterality Date   ABDOMINAL HYSTERECTOMY  2012   supra cervical   CESAREAN SECTION  [id number]   CHOLECYSTECTOMY     COLONOSCOPY WITH PROPOFOL  N/A 12/18/2019   Procedure: COLONOSCOPY WITH PROPOFOL ;  Surgeon: Unk Corinn Skiff, MD;  Location: [facility];  Service: Gastroenterology;  Laterality: N/A;    DILATION AND CURETTAGE OF UTERUS     ESOPHAGOGASTRODUODENOSCOPY (EGD) WITH PROPOFOL  N/A 03/18/2020   Procedure: ESOPHAGOGASTRODUODENOSCOPY (EGD) WITH PROPOFOL ;  Surgeon: Unk Corinn Skiff, MD;  Location: [facility];  Service: Gastroenterology;  Laterality: N/A;   GASTRIC BYPASS  2005   INDUCED ABORTION N/A 1997   patient was about 22 weeks   POLYPECTOMY  12/18/2019   Procedure: POLYPECTOMY;  Surgeon: Unk Corinn Skiff, MD;  Location: [facility];  Service: Gastroenterology;;   SHOULDER ARTHROSCOPY WITH OPEN ROTATOR CUFF REPAIR Left 06/14/2016   Procedure: SHOULDER ARTHROSCOPY WITH OPEN ROTATOR CUFF REPAIR, DECOMPRESSION, EXCISION LOOSE BODY, DEBRIDEMENT;  Surgeon: Norleen JINNY Maltos, MD;  Location: [facility];  Service: Orthopedics;  Laterality: Left;    Family History  Problem Relation Age of Onset   Hypertension Mother    Cancer Father        prostate   Heart disease Brother    Breast cancer Cousin 40       mat cousin   Hypertension Brother    Hypertension Brother     Social History   Tobacco Use   Smoking status: Never   Smokeless tobacco: Never  Substance Use Topics   Alcohol use: No    Alcohol/week: 0.0 standard drinks of alcohol     Current Outpatient Medications:    acetaminophen  (TYLENOL ) 325 MG tablet, Take 325 mg by mouth every 6 (six) hours as needed. PRN, Disp: , Rfl:    albuterol  (VENTOLIN  HFA) 108 (90 Base) MCG/ACT inhaler, Inhale 2 puffs into the lungs every 6 (six) hours as needed for wheezing or shortness of breath., Disp: 18 g, Rfl: 0   buPROPion  (WELLBUTRIN  XL) 300 MG 24 hr tablet, Take 1 tablet (300 mg total) by mouth daily., Disp: 90 tablet, Rfl: 1   fluticasone  (FLONASE ) 50 MCG/ACT nasal spray, Place 2 sprays into both nostrils daily., Disp: 48 g, Rfl: 0   loratadine  (CLARITIN ) 10 MG tablet, Take 1 tablet (10 mg total) by mouth daily., Disp: 90 tablet, Rfl: 1   nystatin  cream (MYCOSTATIN ), Apply 1 Application topically 2 (two) times daily.,  Disp: 30 g, Rfl: 5   omeprazole  (PRILOSEC) 40 MG capsule, Take 1 capsule (40 mg total) by mouth daily., Disp: 90 capsule, Rfl: 0   sertraline  (ZOLOFT ) 50 MG tablet, Take 1 tablet (50 mg total) by mouth daily., Disp: 90 tablet, Rfl: 0   tirzepatide  (ZEPBOUND ) 10 MG/0.5ML Pen, Inject 10 mg into the skin once a week., Disp: 2 mL, Rfl: 2   triamterene -hydrochlorothiazide  (MAXZIDE-25) 37.5-25 MG tablet, Take 1 tablet by mouth daily. In place of HCTZ 25 , BP medication, Disp: 90 tablet, Rfl: 1   Vitamin D , Ergocalciferol , (DRISDOL ) 1.25 MG (50000 UNIT) CAPS capsule, Take 1 capsule (50,000 Units total) by mouth every 7 (seven) days., Disp: 12 capsule, Rfl: 1   amLODipine  (NORVASC ) 2.5 MG tablet, Take 1 tablet (2.5 mg total) by mouth daily., Disp: 90 tablet, Rfl: 1   aspirin  EC 81 MG tablet, Take 1 tablet (81 mg total) by mouth daily. Swallow whole. (Patient not taking: Reported on 05/20/2024), Disp: , Rfl:    baclofen  (LIORESAL ) 10 MG tablet, Take 1 tablet (10  mg total) by mouth 3 (three) times daily as needed for muscle spasms. (Patient not taking: Reported on 05/20/2024), Disp: 60 each, Rfl: 0   busPIRone  (BUSPAR ) 5 MG tablet, Take 1 tablet (5 mg total) by mouth 2 (two) times daily. (Patient not taking: Reported on 05/20/2024), Disp: 60 tablet, Rfl: 0   cyanocobalamin  (VITAMIN B12) 1000 MCG/ML injection, Inject into the muscle. (Patient not taking: Reported on 05/20/2024), Disp: , Rfl:    furosemide  (LASIX ) 20 MG tablet, Take 1 tablet (20 mg total) by mouth daily as needed. For leg swelling (Patient not taking: Reported on 05/20/2024), Disp: 30 tablet, Rfl: 5   hydrOXYzine  (ATARAX ) 25 MG tablet, Take 1 tablet (25 mg total) by mouth 3 (three) times daily as needed for anxiety. (Patient not taking: Reported on 05/20/2024), Disp: 30 tablet, Rfl: 0   montelukast  (SINGULAIR ) 10 MG tablet, Take 1 tablet (10 mg total) by mouth at bedtime., Disp: 90 tablet, Rfl: 1   rosuvastatin  (CRESTOR ) 40 MG tablet, Take 1 tablet (40  mg total) by mouth daily., Disp: 90 tablet, Rfl: 1  Allergies  Allergen Reactions   Ace Inhibitors     angioedema   Codeine  Swelling   Valsartan Swelling    I personally reviewed active problem list, medication list, allergies, family history with the patient/caregiver today.   ROS  Ten systems reviewed and is negative except as mentioned in HPI    Objective Physical Exam CONSTITUTIONAL: Patient appears well-developed and well-nourished.  No distress. HEENT: Head atraumatic, normocephalic, neck supple. CARDIOVASCULAR: Normal rate, regular rhythm and normal heart sounds.  No murmur heard. No BLE edema. PULMONARY: Effort normal and breath sounds normal. No respiratory distress. ABDOMINAL: There is no tenderness or distention. MUSCULOSKELETAL: Normal gait. Without gross motor or sensory deficit. PSYCHIATRIC: Patient has a normal mood and affect. behavior is normal. Judgment and thought content normal.  Vitals:   05/20/24 0927  BP: 112/72  Pulse: 87  Resp: 16  SpO2: 100%  Weight: 189 lb 12.8 oz (86.1 kg)  Height: 5' 7 (1.702 m)    Body mass index is 29.73 kg/m.  Recent Results (from the past 2160 hours)  CBC     Status: Abnormal   Collection Time: 03/25/24 10:10 AM  Result Value Ref Range   WBC 4.5 4.0 - 10.5 K/uL   RBC 4.04 3.87 - 5.11 MIL/uL   Hemoglobin 12.5 12.0 - 15.0 g/dL   HCT 64.1 (L) 63.9 - 53.9 %   MCV 88.6 80.0 - 100.0 fL   MCH 30.9 26.0 - 34.0 pg   MCHC 34.9 30.0 - 36.0 g/dL   RDW 87.7 88.4 - 84.4 %   Platelets 214 150 - 400 K/uL   nRBC 0.0 0.0 - 0.2 %    Comment: Performed at [facility], [address], [place], [place] [address]  Basic metabolic panel     Status: None   Collection Time: 03/25/24 10:10 AM  Result Value Ref Range   Sodium 138 135 - 145 mmol/L   Potassium 4.0 3.5 - 5.1 mmol/L   Chloride 104 98 - 111 mmol/L   CO2 26 22 - 32 mmol/L   Glucose, Bld 88 70 - 99 mg/dL    Comment: Glucose reference range applies only to  samples taken after fasting for at least 8 hours.   BUN 17 6 - 20 mg/dL   Creatinine, Ser 9.13 0.44 - 1.00 mg/dL   Calcium  9.1 8.9 - 10.3 mg/dL   GFR, Estimated >39 >39  mL/min    Comment: (NOTE) Calculated using the CKD-EPI Creatinine Equation (2021)    Anion gap 8 5 - 15    Comment: Performed at [facility], [address] [place], [place] [address]      PHQ2/9:    05/20/2024    9:22 AM 02/11/2024    3:18 PM 11/13/2023    3:22 PM 09/13/2023   10:28 AM 06/11/2023   11:37 AM  Depression screen PHQ 2/9  Decreased Interest 3 1 0 0 0  Down, Depressed, Hopeless 2 2 0 0 0  PHQ - 2 Score 5 3 0 0 0  Altered sleeping 0 3 0 1 3  Tired, decreased energy 3 3 0 1 3  Change in appetite 0 1 0 0 0  Feeling bad or failure about yourself  0 0 0 0 0  Trouble concentrating 2 3 0 1 3  Moving slowly or fidgety/restless 0 0 0 0 0  Suicidal thoughts 0 0 0 0 0  PHQ-9 Score 10 13 0 3 9  Difficult doing work/chores Somewhat difficult Not difficult at all Not difficult at all Not difficult at all Not difficult at all    phq 9 is negative  Fall Risk:    05/20/2024    9:19 AM 02/11/2024    3:07 PM 11/13/2023    3:22 PM 09/13/2023   10:22 AM 06/11/2023   11:37 AM  Fall Risk   Falls in the past year? 0 0 0 0 0  Number falls in past yr: 0 0 0    Injury with Fall? 0 0 0    Risk for fall due to : No Fall Risks  No Fall Risks No Fall Risks No Fall Risks  Follow up Falls evaluation completed Falls evaluation completed Falls prevention discussed;Education provided;Falls evaluation completed Falls prevention discussed;Education provided;Falls evaluation completed Falls prevention discussed     Assessment & Plan Coronary artery disease without angina pectoris Managed medically with no angina symptoms. Cardiac CTA score 200, 94th percentile. Informed about atypical heart attack symptoms in women. - Continue medical management with blood pressure control and statin therapy. - Educate on recognizing  atypical heart attack symptoms in women.  Hypertension Blood pressure controlled with triamterene  HCTZ and amlodipine . Dizziness possibly related to blood pressure or other factors. Amlodipine  discontinuation considered if unnecessary. - Continue triamterene  HCTZ and amlodipine . - Monitor blood pressure, especially with dizziness. - Evaluate need for amlodipine  based on future readings.  Hyperlipidemia Managed with rosuvastatin  40 mg. No side effects reported. - Continue rosuvastatin  40 mg.  Obesity On Mounjaro  (Zepbound ) for weight loss, lost 7 pounds. Insurance covers except $200. Uses pen form. - Continue Mounjaro  (Zepbound ) 10 mg pen with monthly refills. - Encourage continued weight loss efforts.  Major depressive disorder, recurrent episode with anxiety Stress and depression related to personal and work issues. Wellbutrin  300 mg not effective. Considering Zoloft  addition. Symptoms include lack of interest, fatigue, and concentration issues. - Add Zoloft  (sertraline ) to current regimen. - Continue Wellbutrin  300 mg. - Encourage self-care activities. - Discuss potential benefits of therapy.  Asthma Well-controlled with montelukast  (Singulair ). No symptoms of wheezing, cough, or shortness of breath. - Continue montelukast  (Singulair ).  Vitamin B12 deficiency Vitamin B12 levels elevated.  - Monitor B12 levels as per schedule.  Dizziness and lightheadedness Dizziness and lightheadedness possibly related to blood pressure, low blood sugar, or stress. Occurs with head movement and eye strain. Further evaluation needed. - Monitor symptoms and correlate with blood pressure and  blood sugar levels. - Maintain hydration and regular meals. - Consider ENT evaluation if symptoms persist.

## 2024-05-21 ENCOUNTER — Other Ambulatory Visit (HOSPITAL_COMMUNITY): Payer: Self-pay

## 2024-05-21 NOTE — Telephone Encounter (Signed)
 Pharmacy Patient Advocate Encounter  Received notification from [facility] that Prior Authorization for Zepbound  10MG /0.5ML pen-injectors has been DENIED.  See denial reason below. No denial letter attached in CMM. Will attach denial letter to Media tab once received.     PA #/Case ID/Reference #: [id number]  Ms. Mandala need to contact [facility] Enrollment per plan

## 2024-05-28 ENCOUNTER — Other Ambulatory Visit: Payer: Self-pay

## 2024-05-28 ENCOUNTER — Encounter: Payer: Self-pay | Admitting: Family Medicine

## 2024-05-28 DIAGNOSIS — R7303 Prediabetes: Secondary | ICD-10-CM

## 2024-05-28 DIAGNOSIS — I10 Essential (primary) hypertension: Secondary | ICD-10-CM

## 2024-05-29 ENCOUNTER — Other Ambulatory Visit (HOSPITAL_COMMUNITY): Payer: Self-pay

## 2024-06-02 ENCOUNTER — Ambulatory Visit: Payer: Self-pay

## 2024-06-02 NOTE — Addendum Note (Signed)
 Addended by: YVONE WARREN BROCKS on: 06/02/2024 11:47 AM   Modules accepted: Orders

## 2024-06-11 DIAGNOSIS — I10 Essential (primary) hypertension: Secondary | ICD-10-CM | POA: Diagnosis not present

## 2024-06-11 DIAGNOSIS — R7303 Prediabetes: Secondary | ICD-10-CM | POA: Diagnosis not present

## 2024-06-12 LAB — HEMOGLOBIN A1C
Hgb A1c MFr Bld: 5.6 % (ref <5.7)
Mean Plasma Glucose: 114 mg/dL
eAG (mmol/L): 6.3 mmol/L

## 2024-06-12 LAB — COMPREHENSIVE METABOLIC PANEL WITH GFR
AG Ratio: 1.9 (calc) (ref 1.0–2.5)
ALT: 10 U/L (ref 6–29)
AST: 12 U/L (ref 10–35)
Albumin: 4.5 g/dL (ref 3.6–5.1)
Alkaline phosphatase (APISO): 100 U/L (ref 37–153)
BUN: 15 mg/dL (ref 7–25)
CO2: 30 mmol/L (ref 20–32)
Calcium: 9.7 mg/dL (ref 8.6–10.4)
Chloride: 105 mmol/L (ref 98–110)
Creat: 0.84 mg/dL (ref 0.50–1.05)
Globulin: 2.4 g/dL (ref 1.9–3.7)
Glucose, Bld: 95 mg/dL (ref 65–99)
Potassium: 4 mmol/L (ref 3.5–5.3)
Sodium: 144 mmol/L (ref 135–146)
Total Bilirubin: 0.7 mg/dL (ref 0.2–1.2)
Total Protein: 6.9 g/dL (ref 6.1–8.1)
eGFR: 79 mL/min/1.73m2 (ref >=60)

## 2024-06-14 ENCOUNTER — Ambulatory Visit: Payer: Self-pay | Admitting: Family Medicine

## 2024-08-17 ENCOUNTER — Encounter: Payer: Self-pay | Admitting: Family Medicine

## 2024-08-25 ENCOUNTER — Ambulatory Visit: Admitting: Family Medicine

## 2024-08-25 ENCOUNTER — Encounter: Payer: Self-pay | Admitting: Family Medicine

## 2024-08-25 VITALS — BP 122/76 | HR 75 | Resp 16 | Ht 67.0 in | Wt 193.2 lb

## 2024-08-25 DIAGNOSIS — I2510 Atherosclerotic heart disease of native coronary artery without angina pectoris: Secondary | ICD-10-CM

## 2024-08-25 DIAGNOSIS — K219 Gastro-esophageal reflux disease without esophagitis: Secondary | ICD-10-CM

## 2024-08-25 DIAGNOSIS — F325 Major depressive disorder, single episode, in full remission: Secondary | ICD-10-CM

## 2024-08-25 DIAGNOSIS — H6123 Impacted cerumen, bilateral: Secondary | ICD-10-CM | POA: Diagnosis not present

## 2024-08-25 DIAGNOSIS — I10 Essential (primary) hypertension: Secondary | ICD-10-CM

## 2024-08-25 DIAGNOSIS — J4530 Mild persistent asthma, uncomplicated: Secondary | ICD-10-CM

## 2024-08-25 DIAGNOSIS — E66811 Obesity, class 1: Secondary | ICD-10-CM | POA: Diagnosis not present

## 2024-08-25 DIAGNOSIS — Z1231 Encounter for screening mammogram for malignant neoplasm of breast: Secondary | ICD-10-CM

## 2024-08-25 DIAGNOSIS — F5104 Psychophysiologic insomnia: Secondary | ICD-10-CM

## 2024-08-25 DIAGNOSIS — E559 Vitamin D deficiency, unspecified: Secondary | ICD-10-CM

## 2024-08-25 DIAGNOSIS — E785 Hyperlipidemia, unspecified: Secondary | ICD-10-CM

## 2024-08-25 MED ORDER — BUDESONIDE-FORMOTEROL FUMARATE 160-4.5 MCG/ACT IN AERO: 2.0000 | INHALATION_SPRAY | Freq: Two times a day (BID) | RESPIRATORY_TRACT | 3 refills | Status: AC

## 2024-08-25 MED ORDER — TRIAMTERENE-HCTZ 37.5-25 MG PO TABS: 1.0000 | ORAL_TABLET | Freq: Every day | ORAL | 1 refills | Status: AC

## 2024-08-25 MED ORDER — SERTRALINE HCL 50 MG PO TABS
50.0000 mg | ORAL_TABLET | Freq: Every day | ORAL | 1 refills | Status: AC
Start: 2024-08-25 — End: ?

## 2024-08-25 MED ORDER — BUPROPION HCL ER (XL) 150 MG PO TB24: 150.0000 mg | ORAL_TABLET | ORAL | 0 refills | Status: DC

## 2024-08-25 MED ORDER — HYDROXYZINE HCL 10 MG PO TABS: 10.0000 mg | ORAL_TABLET | Freq: Every evening | ORAL | 0 refills | Status: AC

## 2024-08-25 NOTE — Progress Notes (Signed)
 Name: Savannah Manning   MRN: [id number]    DOB: 1961/04/25   Date:08/25/2024       Progress Note  Subjective  Chief Complaint  Chief Complaint  Patient presents with   Medical Management of Chronic Issues   Discussed the use of AI scribe software for clinical note transcription with the patient, who gave verbal consent to proceed.  History of Present Illness Savannah Manning is a 63 year old female with obesity and hypertension who presents for follow-up on weight management and medication adjustments.  She has a long-standing history of obesity, having undergone gastric bypass surgery in 2005, which initially reduced her weight from 314 pounds to 164 pounds. However, she began gaining weight again after starting to work from home in 2010. She has tried various medications including Contrave , Wellbutrin , and Saxenda . She started Zepbound  in December 2024, initially weighing around 221 pounds, and has since lost approximately 20 pounds, currently weighing 193 pounds. She aims to reach a weight of 180 pounds. She is currently on a 5 mg dose of Zepbound  after a gap in medication due to insurance issues, which led to a temporary weight gain.  Zepbound  helps curb her appetite, although she has not consulted a dietitian. She tries to limit bread intake and engages in walking, though hip pain sometimes limits her activity. She does not have access to a pool for swimming and cannot afford a gym membership due to financial constraints.  She experiences stress related to work and is currently going through a separation. She has a history of moderate major depression, which she feels is in remission, but she still experiences anxiety. She takes Zoloft  and Wellbutrin  for stress and depression, though she has not been taking them consistently.  She has asthma and uses Singulair  for management. She reports a recent onset of coughing, likely due to seasonal changes, and has previously used an inhaler. She also  takes Prilosec for reflux and over-the-counter vitamin D  supplements.    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   Chronic pain of right knee 07/26/2017   Vitamin D  deficiency 04/24/2017   B12 deficiency 04/24/2017   Abnormal mammogram of right breast 01/01/2017   History of shoulder surgery 10/12/2016   Primary osteoarthritis of left shoulder 06/14/2016   Incomplete tear of left rotator cuff 05/30/2016   Rotator cuff tendinitis 05/30/2016   History of bariatric surgery 05/04/2016   Allergic rhinitis 05/12/2015   Benign essential HTN 05/12/2015   Dyslipidemia 05/12/2015   Bilateral lower extremity edema 05/12/2015   Gastroesophageal reflux disease without esophagitis 05/12/2015   Migraine 05/12/2015   Plantar fasciitis 05/12/2015   Umbilical hernia without obstruction and without gangrene 05/12/2015    Past Surgical History:  Procedure Laterality Date   ABDOMINAL HYSTERECTOMY  2012   supra cervical   CESAREAN SECTION  [id number]   CHOLECYSTECTOMY     COLONOSCOPY WITH PROPOFOL  N/A 12/18/2019   Procedure: COLONOSCOPY WITH PROPOFOL ;  Surgeon: Unk Corinn Skiff, MD;  Location: [facility];  Service: Gastroenterology;  Laterality: N/A;  DILATION AND CURETTAGE OF UTERUS     ESOPHAGOGASTRODUODENOSCOPY (EGD) WITH PROPOFOL  N/A 03/18/2020   Procedure: ESOPHAGOGASTRODUODENOSCOPY (EGD) WITH PROPOFOL ;  Surgeon: Unk Corinn Skiff, MD;  Location: [facility];  Service: Gastroenterology;  Laterality: N/A;    GASTRIC BYPASS  2005   INDUCED ABORTION N/A 1997   patient was about 22 weeks   POLYPECTOMY  12/18/2019   Procedure: POLYPECTOMY;  Surgeon: Unk Corinn Skiff, MD;  Location: [facility];  Service: Gastroenterology;;   SHOULDER ARTHROSCOPY WITH OPEN ROTATOR CUFF REPAIR Left 06/14/2016   Procedure: SHOULDER ARTHROSCOPY WITH OPEN ROTATOR CUFF REPAIR, DECOMPRESSION, EXCISION LOOSE BODY, DEBRIDEMENT;  Surgeon: Norleen JINNY Maltos, MD;  Location: [facility];  Service: Orthopedics;  Laterality: Left;    Family History  Problem Relation Age of Onset   Hypertension Mother    Cancer Father        prostate   Heart disease Brother    Breast cancer Cousin 40       mat cousin   Hypertension Brother    Hypertension Brother     Social History   Tobacco Use   Smoking status: Never   Smokeless tobacco: Never  Substance Use Topics   Alcohol use: No    Alcohol/week: 0.0 standard drinks of alcohol     Current Outpatient Medications:    acetaminophen  (TYLENOL ) 325 MG tablet, Take 325 mg by mouth every 6 (six) hours as needed. PRN, Disp: , Rfl:    albuterol  (VENTOLIN  HFA) 108 (90 Base) MCG/ACT inhaler, Inhale 2 puffs into the lungs every 6 (six) hours as needed for wheezing or shortness of breath., Disp: 18 g, Rfl: 0   amLODipine  (NORVASC ) 2.5 MG tablet, Take 1 tablet (2.5 mg total) by mouth daily., Disp: 90 tablet, Rfl: 1   budesonide -formoterol  (SYMBICORT ) 160-4.5 MCG/ACT inhaler, Inhale 2 puffs into the lungs 2 (two) times daily., Disp: 1 each, Rfl: 3   Cholecalciferol (VITAMIN D3) 50 MCG (2000 UT) CAPS, Take 1 capsule by mouth daily after breakfast., Disp: , Rfl:    fluticasone  (FLONASE ) 50 MCG/ACT nasal spray, Place 2 sprays into both nostrils daily., Disp: 48 g, Rfl: 0   loratadine  (CLARITIN ) 10 MG tablet, Take 1 tablet (10 mg total) by mouth daily., Disp: 90 tablet, Rfl: 1   montelukast  (SINGULAIR ) 10 MG tablet, Take 1 tablet (10 mg total) by mouth at bedtime., Disp: 90 tablet, Rfl: 1    nystatin  cream (MYCOSTATIN ), Apply 1 Application topically 2 (two) times daily., Disp: 30 g, Rfl: 5   omeprazole  (PRILOSEC) 40 MG capsule, Take 1 capsule (40 mg total) by mouth daily., Disp: 90 capsule, Rfl: 0   rosuvastatin  (CRESTOR ) 40 MG tablet, Take 1 tablet (40 mg total) by mouth daily., Disp: 90 tablet, Rfl: 1   ZEPBOUND  5 MG/0.5ML Pen, Inject 5 mg into the skin once a week., Disp: , Rfl:    buPROPion  (WELLBUTRIN  XL) 150 MG 24 hr tablet, Take 1 tablet (150 mg total) by mouth every morning., Disp: 90 tablet, Rfl: 0   busPIRone  (BUSPAR ) 5 MG tablet, Take 1 tablet (5 mg total) by mouth 2 (two) times daily. (Patient not taking: Reported on 08/25/2024), Disp: 60 tablet, Rfl: 0   cyanocobalamin  (VITAMIN B12) 1000 MCG/ML injection, Inject into the muscle. (Patient not taking: Reported on 08/25/2024), Disp: , Rfl:    sertraline  (ZOLOFT ) 50 MG tablet, Take 1 tablet (50 mg total) by mouth daily., Disp: 90 tablet, Rfl: 1   triamterene -hydrochlorothiazide  (MAXZIDE-25) 37.5-25 MG tablet, Take 1 tablet by mouth  daily. In place of HCTZ 25 , BP medication, Disp: 90 tablet, Rfl: 1  Allergies  Allergen Reactions   Ace Inhibitors     angioedema   Codeine  Swelling   Valsartan Swelling    I personally reviewed active problem list, medication list, allergies, family history with the patient/caregiver today.   ROS  Ten systems reviewed and is negative except as mentioned in HPI    Objective Physical Exam CONSTITUTIONAL: Patient appears well-developed and well-nourished. No distress. HEENT: Head atraumatic, normocephalic, neck supple. Cerumen present bilaterally. CARDIOVASCULAR: Normal rate, regular rhythm and normal heart sounds. No murmur heard. No BLE edema. PULMONARY: Effort normal and breath sounds normal. No respiratory distress. ABDOMINAL: There is no tenderness or distention. MUSCULOSKELETAL: Normal gait. Without gross motor or sensory deficit. PSYCHIATRIC: Patient has a normal mood and  affect. Behavior is normal. Judgment and thought content normal.  Vitals:   08/25/24 1504  BP: 122/76  Pulse: 75  Resp: 16  SpO2: 98%  Weight: 193 lb 3.2 oz (87.6 kg)  Height: 5' 7 (1.702 m)    Body mass index is 30.26 kg/m.  Recent Results (from the past 2160 hours)  Comprehensive Metabolic Panel (CMET)     Status: None   Collection Time: 06/11/24  4:00 PM  Result Value Ref Range   Glucose, Bld 95 65 - 99 mg/dL    Comment: .            Fasting reference interval .    BUN 15 7 - 25 mg/dL   Creat 9.15 9.49 - 8.94 mg/dL   eGFR 79 > OR = 60 fO/fpw/8.26f7   BUN/Creatinine Ratio SEE NOTE: 6 - 22 (calc)    Comment:    Not Reported: BUN and Creatinine are within    reference range. .    Sodium 144 135 - 146 mmol/L   Potassium 4.0 3.5 - 5.3 mmol/L   Chloride 105 98 - 110 mmol/L   CO2 30 20 - 32 mmol/L   Calcium  9.7 8.6 - 10.4 mg/dL   Total Protein 6.9 6.1 - 8.1 g/dL   Albumin 4.5 3.6 - 5.1 g/dL   Globulin 2.4 1.9 - 3.7 g/dL (calc)   AG Ratio 1.9 1.0 - 2.5 (calc)   Total Bilirubin 0.7 0.2 - 1.2 mg/dL   Alkaline phosphatase (APISO) 100 37 - 153 U/L   AST 12 10 - 35 U/L   ALT 10 6 - 29 U/L  Hemoglobin A1C     Status: None   Collection Time: 06/11/24  4:00 PM  Result Value Ref Range   Hgb A1c MFr Bld 5.6 <5.7 %    Comment: For the purpose of screening for the presence of diabetes: . <5.7%       Consistent with the absence of diabetes 5.7-6.4%    Consistent with increased risk for diabetes             (prediabetes) > or =6.5%  Consistent with diabetes . This assay result is consistent with a decreased risk of diabetes. . Currently, no consensus exists regarding use of hemoglobin A1c for diagnosis of diabetes in children. . According to American Diabetes Association (ADA) guidelines, hemoglobin A1c <7.0% represents optimal control in non-pregnant diabetic patients. Different metrics may apply to specific patient populations.  Standards of Medical Care in  Diabetes(ADA). .    Mean Plasma Glucose 114 mg/dL   eAG (mmol/L) 6.3 mmol/L     PHQ2/9:    08/25/2024    2:58 PM 05/20/2024  9:22 AM 02/11/2024    3:18 PM 11/13/2023    3:22 PM 09/13/2023   10:28 AM  Depression screen PHQ 2/9  Decreased Interest 0 3 1 0 0  Down, Depressed, Hopeless 0 2 2 0 0  PHQ - 2 Score 0 5 3 0 0  Altered sleeping 0 0 3 0 1  Tired, decreased energy 0 3 3 0 1  Change in appetite 0 0 1 0 0  Feeling bad or failure about yourself  0 0 0 0 0  Trouble concentrating 0 2 3 0 1  Moving slowly or fidgety/restless 0 0 0 0 0  Suicidal thoughts 0 0 0 0 0  PHQ-9 Score 0 10 13 0 3  Difficult doing work/chores Not difficult at all Somewhat difficult Not difficult at all Not difficult at all Not difficult at all    phq 9 is negative  Fall Risk:    08/25/2024    2:58 PM 05/20/2024    9:19 AM 02/11/2024    3:07 PM 11/13/2023    3:22 PM 09/13/2023   10:22 AM  Fall Risk   Falls in the past year? 0 0 0 0 0  Number falls in past yr: 0 0 0 0   Injury with Fall? 0 0 0 0   Risk for fall due to : No Fall Risks No Fall Risks  No Fall Risks No Fall Risks  Follow up Falls evaluation completed Falls evaluation completed Falls evaluation completed Falls prevention discussed;Education provided;Falls evaluation completed Falls prevention discussed;Education provided;Falls evaluation completed   Assessment & Plan Obesity Obesity with history of gastric bypass, weight gain post-surgery, currently on Zepbound  5 mg. Goal weight 180 lbs. - Continue Zepbound  5 mg, titrate as tolerated. - Encourage dietary modifications and physical activity. - Discuss dietitian involvement.  Cerumen impaction Verbal consent given Possible side effects discussed with patient Ears were  lavaged with warm water  and peroxide  Patient tolerated procedure well No complications    Hypertension Hypertension well-controlled, recent stress-related episode. - Continue Maxzide. - Continue amlodipine  2.5 mg  daily.  Persistent asthma Persistent asthma with recent cough, likely due to allergies. - Prescribe Symbicort  inhaler, 2 puffs morning and night.  Coronary artery disease without angina Mild coronary artery disease without angina, no current medication.  Major depressive disorder, in remission Major depressive disorder in remission, stress and anxiety related to work persist. - Continue sertraline  daily. - Continue bupropion  150 mg daily in the morning. - Consider hydroxyzine  for anxiety and sleep as needed.  Anxiety disorder Anxiety disorder with work-related stress, contributing to insomnia. - Consider hydroxyzine  for anxiety and sleep as needed. - Continue sertraline  daily. - Continue bupropion  150 mg daily in the morning.  Insomnia Insomnia possibly related to anxiety, previous quetiapine  use discontinued. - Consider hydroxyzine  for sleep and anxiety, 10 mg as needed, in the evening.  Allergic rhinitis Allergic rhinitis with ear discomfort and wax buildup. - Recommend Flonase  for nasal symptoms. - Perform ear irrigation.  Gastroesophageal reflux disease GERD well-controlled, previously on Prilosec. - Continue Prilosec as needed.

## 2024-11-03 ENCOUNTER — Telehealth: Payer: Self-pay

## 2024-11-03 NOTE — Telephone Encounter (Signed)
 TRIAGE VOICEMAIL: Patient states she has scheduled an annual for 11/17/24. She is also having some discomfort. Uncertain if it is yeast infection or bladder. Her lower back is hurting. She was advised that the first nurse visit is a week out. She doesn't want to wait that long.

## 2024-11-04 ENCOUNTER — Ambulatory Visit

## 2024-11-04 ENCOUNTER — Other Ambulatory Visit (HOSPITAL_COMMUNITY)
Admission: RE | Admit: 2024-11-04 | Discharge: 2024-11-04 | Disposition: A | Discharge status: Home / Self Care | Source: Ambulatory Visit | Attending: Obstetrics | Admitting: Obstetrics

## 2024-11-04 VITALS — BP 136/80 | HR 60 | Ht 67.0 in | Wt 182.0 lb

## 2024-11-04 DIAGNOSIS — N898 Other specified noninflammatory disorders of vagina: Secondary | ICD-10-CM | POA: Diagnosis not present

## 2024-11-04 DIAGNOSIS — R350 Frequency of micturition: Secondary | ICD-10-CM

## 2024-11-04 LAB — POCT URINALYSIS DIPSTICK
Bilirubin, UA: NEGATIVE
Blood, UA: POSITIVE
Glucose, UA: NEGATIVE
Ketones, UA: POSITIVE
Leukocytes, UA: NEGATIVE
Nitrite, UA: NEGATIVE
Protein, UA: NEGATIVE
Spec Grav, UA: 1.01
Urobilinogen, UA: 0.2 U/dL
pH, UA: 5.5

## 2024-11-04 MED ORDER — FLUCONAZOLE 150 MG PO TABS: 150.0000 mg | ORAL_TABLET | Freq: Every day | ORAL | 0 refills | Status: DC

## 2024-11-04 NOTE — Progress Notes (Signed)
" ° ° °  NURSE VISIT NOTE  Subjective:    Patient ID: Savannah Manning, female    DOB: 26-Jun-1961, 63 y.o.   MRN: [id number]       HPI  Patient is a 63 y.o. [id number] female who presents for urinary urgency, genital irritation, and lower back pain  for 7 days.  Patient denies dysuria, hematuria, flank pain, abdominal pain, pelvic pain, cloudy malordorous urine, and vaginal discharge.  Patient does have a history of recurrent UTI.  Patient does have a history of pyelonephritis.    Objective:    BP 136/80   Pulse 60   Ht 5' 7 (1.702 m)   Wt 182 lb (82.6 kg)   BMI 28.51 kg/m    Lab Review  Results for orders placed or performed in visit on 11/04/24  POCT urinalysis dipstick  Result Value Ref Range   Color, UA     Clarity, UA     Glucose, UA Negative Negative   Bilirubin, UA Negative    Ketones, UA positive    Spec Grav, UA 1.010 1.010 - 1.025   Blood, UA positive    pH, UA 5.5 5.0 - 8.0   Protein, UA Negative Negative   Urobilinogen, UA 0.2 0.2 or 1.0 E.U./dL   Nitrite, UA Negative    Leukocytes, UA Negative Negative   Appearance     Odor      Assessment:   1. Vaginal itching   2. Urine frequency      Plan:   Urine culture sent Diflucan sent  Try azo to help until results are back   Harlene Gander, CMA       "

## 2024-11-06 ENCOUNTER — Encounter: Payer: Self-pay | Admitting: Obstetrics

## 2024-11-06 ENCOUNTER — Other Ambulatory Visit: Payer: Self-pay | Admitting: Obstetrics

## 2024-11-06 DIAGNOSIS — N760 Acute vaginitis: Secondary | ICD-10-CM | POA: Insufficient documentation

## 2024-11-06 LAB — URINE CULTURE

## 2024-11-06 LAB — CERVICOVAGINAL ANCILLARY ONLY
Bacterial Vaginitis (gardnerella): POSITIVE — AB
Candida Glabrata: NEGATIVE
Candida Vaginitis: NEGATIVE
Chlamydia: NEGATIVE
Comment: NEGATIVE
Comment: NEGATIVE
Comment: NEGATIVE
Comment: NEGATIVE
Comment: NEGATIVE
Comment: NORMAL
Neisseria Gonorrhea: NEGATIVE
Trichomonas: NEGATIVE

## 2024-11-06 MED ORDER — METRONIDAZOLE 500 MG PO TABS: 500.0000 mg | ORAL_TABLET | Freq: Two times a day (BID) | ORAL | 0 refills | Status: DC

## 2024-11-06 NOTE — Progress Notes (Signed)
+  BV. Rx for metronidazole  500 mg PO BID x 7 days sent to pharmacy. Lissa notified via MyChart.  M. Justino, CNM

## 2024-11-09 ENCOUNTER — Ambulatory Visit
Admission: RE | Admit: 2024-11-09 | Discharge: 2024-11-09 | Disposition: A | Discharge status: Home / Self Care | Source: Ambulatory Visit | Attending: Family Medicine | Admitting: Family Medicine

## 2024-11-09 DIAGNOSIS — Z1231 Encounter for screening mammogram for malignant neoplasm of breast: Secondary | ICD-10-CM | POA: Diagnosis present

## 2024-11-10 ENCOUNTER — Ambulatory Visit: Payer: Self-pay

## 2024-11-10 ENCOUNTER — Telehealth: Payer: Self-pay

## 2024-11-10 NOTE — Telephone Encounter (Signed)
 Encountered opened in error

## 2024-11-17 ENCOUNTER — Ambulatory Visit (INDEPENDENT_AMBULATORY_CARE_PROVIDER_SITE_OTHER): Admitting: Advanced Practice Midwife

## 2024-11-17 ENCOUNTER — Encounter: Payer: Self-pay | Admitting: Advanced Practice Midwife

## 2024-11-17 VITALS — BP 139/86 | HR 57 | Ht 67.0 in | Wt 183.6 lb

## 2024-11-17 DIAGNOSIS — Z01419 Encounter for gynecological examination (general) (routine) without abnormal findings: Secondary | ICD-10-CM | POA: Diagnosis not present

## 2024-11-17 DIAGNOSIS — Z1331 Encounter for screening for depression: Secondary | ICD-10-CM

## 2024-11-17 NOTE — Progress Notes (Signed)
 "     Gynecology Annual Exam  PCP: Glenard Mire, MD  Chief Complaint:  Chief Complaint  Patient presents with   Annual Exam    History of Present Illness:Patient is a 64 y.o. G2P0011 presents for annual exam. The patient has no complaints today. Had symptoms of vaginitis 2 weeks ago and tested positive for BV. Treatment completed. No symptoms today. Has some vaginal dryness and hot flashes. She declines HRT at this time. Reviewed other comfort measures.  LMP: No LMP recorded. Patient has had a hysterectomy.   The patient is not sexually active. The patient does perform self breast exams.  There is notable family history of breast or ovarian cancer in her family. Her maternal cousin had breast cancer at an unknown age.  The patient wears seatbelts: yes.   The patient has regular exercise: no.  She admits a primarily sedentary lifestyle. Admits healthy diet, inadequate hydration with water  and about 5 hours of sleep most nights. Having some hot flashes that interrupt sleep.   The patient denies current symptoms of depression.     Review of Systems: Review of Systems  Constitutional:  Negative for chills and fever.  HENT:  Negative for congestion, ear discharge, ear pain, hearing loss, sinus pain and sore throat.   Eyes:  Negative for blurred vision and double vision.  Respiratory:  Negative for cough, shortness of breath and wheezing.   Cardiovascular:  Negative for chest pain, palpitations and leg swelling.  Gastrointestinal:  Negative for abdominal pain, blood in stool, constipation, diarrhea, heartburn, melena, nausea and vomiting.  Genitourinary:  Negative for dysuria, flank pain, frequency, hematuria and urgency.  Musculoskeletal:  Negative for back pain, joint pain and myalgias.  Skin:  Negative for itching and rash.  Neurological:  Negative for dizziness, tingling, tremors, sensory change, speech change, focal weakness, seizures, loss of consciousness, weakness and headaches.   Endo/Heme/Allergies:  Negative for environmental allergies. Does not bruise/bleed easily.        Positive for hot flashes, vaginal dryness  Psychiatric/Behavioral:  Negative for depression, hallucinations, memory loss, substance abuse and suicidal ideas. The patient is not nervous/anxious and does not have insomnia.     Past Medical History:  Patient Active Problem List   Diagnosis Date Noted Date Diagnosed   Bacterial vaginosis 11/06/2024    Major depression in remission 08/25/2024    Lumbar spondylosis 05/06/2023    Primary osteoarthritis of left hip 05/06/2023    Intertrigo 05/01/2022    Insomnia due to mental condition 05/01/2022    Mild intermittent asthma without complication 10/20/2020    Primary osteoarthritis of both knees 10/20/2020    Primary osteoarthritis of right knee 04/06/2020    Primary osteoarthritis of left knee 04/06/2020    Pre-diabetes 12/05/2018    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/07/2018    Chronic pain of right knee 07/26/2017    Vitamin D  deficiency 04/24/2017    B12 deficiency 04/24/2017    Abnormal mammogram of right breast 01/01/2017    History of shoulder surgery 10/12/2016     Left, rotator cuff repair    Primary osteoarthritis of left shoulder 06/14/2016    Incomplete tear of left rotator cuff 05/30/2016    Rotator cuff tendinitis 05/30/2016    History of bariatric surgery 05/04/2016    Allergic rhinitis 05/12/2015    Benign essential HTN 05/12/2015    Dyslipidemia 05/12/2015     See lab work 04/2014    Bilateral lower extremity edema 05/12/2015    Gastroesophageal reflux  disease without esophagitis 05/12/2015    Migraine 05/12/2015    Plantar fasciitis 05/12/2015    Umbilical hernia without obstruction and without gangrene 05/12/2015     Past Surgical History:  Past Surgical History:  Procedure Laterality Date   ABDOMINAL HYSTERECTOMY  2012   supra cervical   CESAREAN SECTION  [id number]   CHOLECYSTECTOMY     COLONOSCOPY WITH PROPOFOL  N/A  12/18/2019   Procedure: COLONOSCOPY WITH PROPOFOL ;  Surgeon: Unk Corinn Skiff, MD;  Location: [facility];  Service: Gastroenterology;  Laterality: N/A;   DILATION AND CURETTAGE OF UTERUS     ESOPHAGOGASTRODUODENOSCOPY (EGD) WITH PROPOFOL  N/A 03/18/2020   Procedure: ESOPHAGOGASTRODUODENOSCOPY (EGD) WITH PROPOFOL ;  Surgeon: Unk Corinn Skiff, MD;  Location: [facility];  Service: Gastroenterology;  Laterality: N/A;   GASTRIC BYPASS  2005   INDUCED ABORTION N/A 1997   patient was about 22 weeks   POLYPECTOMY  12/18/2019   Procedure: POLYPECTOMY;  Surgeon: Unk Corinn Skiff, MD;  Location: [facility];  Service: Gastroenterology;;   SHOULDER ARTHROSCOPY WITH OPEN ROTATOR CUFF REPAIR Left 06/14/2016   Procedure: SHOULDER ARTHROSCOPY WITH OPEN ROTATOR CUFF REPAIR, DECOMPRESSION, EXCISION LOOSE BODY, DEBRIDEMENT;  Surgeon: Norleen JINNY Maltos, MD;  Location: [facility];  Service: Orthopedics;  Laterality: Left;    Gynecologic History:  No LMP recorded. Patient has had a hysterectomy. Last Pap: 12/21/20 Results were:  NIL and HR HPV negative  Last mammogram: 11/09/24 Results were: DONNA I  Obstetric History: G2P0011  Family History:  Family History  Problem Relation Age of Onset   Hypertension Mother    Cancer Father        prostate   Heart disease Brother    Breast cancer Cousin        mat cousin   Hypertension Brother    Hypertension Brother     Social History:  Social History   Socioeconomic History   Marital status: Single    Spouse name: Not on file   Number of children: 1   Years of education: Not on file   Highest education level: Bachelor's degree (e.g., BA, AB, BS)  Occupational History   Not on file  Tobacco Use   Smoking status: Never   Smokeless tobacco: Never  Vaping Use   Vaping status: Never Used  Substance and Sexual Activity   Alcohol use: No    Alcohol/week: 0.0 standard drinks of alcohol   Drug use: No   Sexual activity: Not Currently     Birth control/protection: Surgical    Comment: Hysterectomy  Other Topics Concern   Not on file  Social History Narrative   Desk job/works for insurance; no smoking; no alcohol; lives in [place].    Social Drivers of Health   Tobacco Use: Low Risk (11/20/2024)   Patient History    Smoking Tobacco Use: Never    Smokeless Tobacco Use: Never    Passive Exposure: Not on file  Financial Resource Strain: Low Risk (11/13/2023)   Overall Financial Resource Strain (CARDIA)    Difficulty of Paying Living Expenses: Not very hard  Food Insecurity: No Food Insecurity (11/13/2023)   Hunger Vital Sign    Worried About Running Out of Food in the Last Year: Never true    Ran Out of Food in the Last Year: Never true  Transportation Needs: No Transportation Needs (11/13/2023)   PRAPARE - Administrator, Civil Service (Medical): No    Lack of Transportation (Non-Medical): No  Physical Activity: Inactive (11/13/2023)  Exercise Vital Sign    Days of Exercise per Week: 0 days    Minutes of Exercise per Session: 0 min  Stress: No Stress Concern Present (11/13/2023)   [employer] of Occupational Health - Occupational Stress Questionnaire    Feeling of Stress : Not at all  Recent Concern: Stress - Stress Concern Present (09/12/2023)   [employer] of Occupational Health - Occupational Stress Questionnaire    Feeling of Stress : To some extent  Social Connections: Moderately Integrated (11/13/2023)   Social Connection and Isolation Panel    Frequency of Communication with Friends and Family: More than three times a week    Frequency of Social Gatherings with Friends and Family: More than three times a week    Attends Religious Services: More than 4 times per year    Active Member of [employer] or Organizations: Yes    Attends Banker Meetings: 1 to 4 times per year    Marital Status: Never married  Intimate Partner Violence: Not At Risk (09/13/2023)   Humiliation, Afraid, Rape,  and Kick questionnaire    Fear of Current or Ex-Partner: No    Emotionally Abused: No    Physically Abused: No    Sexually Abused: No  Depression (PHQ2-9): Low Risk (11/17/2024)   Depression (PHQ2-9)    PHQ-2 Score: 0  Alcohol Screen: Low Risk (11/13/2023)   Alcohol Screen    Last Alcohol Screening Score (AUDIT): 0  Housing: Unknown (11/13/2023)   Housing Stability Vital Sign    Unable to Pay for Housing in the Last Year: No    Number of Times Moved in the Last Year: Not on file    Homeless in the Last Year: No  Recent Concern: Housing - Medium Risk (09/12/2023)   Housing    Last Housing Risk Score: 1  Utilities: Not At Risk (09/13/2023)   AHC Utilities    Threatened with loss of utilities: No  Health Literacy: Adequate Health Literacy (09/13/2023)   B1300 Health Literacy    Frequency of need for help with medical instructions: Never    Allergies:  Allergies[1]  Medications: Prior to Admission medications  Medication Sig Start Date End Date Taking? Authorizing Provider  acetaminophen  (TYLENOL ) 325 MG tablet Take 325 mg by mouth every 6 (six) hours as needed. PRN    [provider]  albuterol  (VENTOLIN  HFA) 108 (90 Base) MCG/ACT inhaler Inhale 2 puffs into the lungs every 6 (six) hours as needed for wheezing or shortness of breath. 11/13/23   Sowles, Krichna, MD  amLODipine  (NORVASC ) 2.5 MG tablet Take 1 tablet (2.5 mg total) by mouth daily. 05/20/24   Sowles, Krichna, MD  budesonide -formoterol  (SYMBICORT ) 160-4.5 MCG/ACT inhaler Inhale 2 puffs into the lungs 2 (two) times daily. 08/25/24   Sowles, Krichna, MD  buPROPion  (WELLBUTRIN  XL) 150 MG 24 hr tablet Take 1 tablet (150 mg total) by mouth every morning. 08/25/24   Sowles, Krichna, MD  busPIRone  (BUSPAR ) 5 MG tablet Take 1 tablet (5 mg total) by mouth 2 (two) times daily. Patient not taking: Reported on 08/25/2024 02/11/24   Sowles, Krichna, MD  Cholecalciferol (VITAMIN D3) 50 MCG (2000 UT) CAPS Take 1 capsule by mouth daily  after breakfast.    [provider]  cyanocobalamin  (VITAMIN B12) 1000 MCG/ML injection Inject into the muscle. Patient not taking: Reported on 08/25/2024    [provider]  fluconazole  (DIFLUCAN ) 150 MG tablet Take 1 tablet (150 mg total) by mouth daily. 11/04/24   Justino,  Melissa M, CNM  fluticasone  (FLONASE ) 50 MCG/ACT nasal spray Place 2 sprays into both nostrils daily. 02/11/24   Sowles, Krichna, MD  hydrOXYzine  (ATARAX ) 10 MG tablet Take 1-2 tablets (10-20 mg total) by mouth every evening. For sleep 08/25/24   Sowles, Krichna, MD  loratadine  (CLARITIN ) 10 MG tablet Take 1 tablet (10 mg total) by mouth daily. 01/18/21   Sowles, Krichna, MD  metroNIDAZOLE  (FLAGYL ) 500 MG tablet Take 1 tablet (500 mg total) by mouth 2 (two) times daily. 11/06/24   Justino Eleanor HERO, CNM  montelukast  (SINGULAIR ) 10 MG tablet Take 1 tablet (10 mg total) by mouth at bedtime. 05/20/24   Sowles, Krichna, MD  nystatin  cream (MYCOSTATIN ) Apply 1 Application topically 2 (two) times daily. 05/01/22   Sowles, Krichna, MD  omeprazole  (PRILOSEC) 40 MG capsule Take 1 capsule (40 mg total) by mouth daily. 11/13/23   Sowles, Krichna, MD  rosuvastatin  (CRESTOR ) 40 MG tablet Take 1 tablet (40 mg total) by mouth daily. 05/20/24   Sowles, Krichna, MD  sertraline  (ZOLOFT ) 50 MG tablet Take 1 tablet (50 mg total) by mouth daily. 08/25/24   Sowles, Krichna, MD  triamterene -hydrochlorothiazide  (MAXZIDE-25) 37.5-25 MG tablet Take 1 tablet by mouth daily. In place of HCTZ 25 , BP medication 08/25/24   Sowles, Krichna, MD  ZEPBOUND  5 MG/0.5ML Pen Inject 5 mg into the skin once a week. 08/21/24   [provider]    Physical Exam Vitals: BP 139/86   Pulse (!) 57   Ht 5' 7 (1.702 m)   Wt 183 lb 9.6 oz (83.3 kg)   BMI 28.76 kg/m   General: NAD HEENT: normocephalic, anicteric Thyroid : no enlargement, no palpable nodules Pulmonary: No increased work of breathing, CTAB Cardiovascular: RRR, distal pulses  2+ Breast: Breast symmetrical, no tenderness, no palpable nodules or masses, no skin or nipple retraction present, no nipple discharge.  No axillary or supraclavicular lymphadenopathy. Abdomen: NABS, soft, non-tender, non-distended.  Umbilicus without lesions.  No hepatomegaly, splenomegaly or masses palpable. No evidence of hernia  Genitourinary: deferred for no concerns/PAP interval Extremities: no edema, erythema, or tenderness Neurologic: Grossly intact Psychiatric: mood appropriate, affect full   Flowsheet Row Office Visit from 11/17/2024 in [facility] at [facility] Total Score 0      Assessment: 64 y.o. G2P0011 routine annual exam  Plan: Problem List Items Addressed This Visit   None Visit Diagnoses       Well woman exam    -  Primary     Depression screening           1) Mammogram - recommend yearly screening mammogram.  Mammogram ordered by PCP  2) STI screening  was not offered and therefore not obtained  3) ASCCP guidelines and rationale discussed.  Patient opts for every 5 years screening interval. Next PAP due in 2027  4) Osteoporosis  - per USPTF routine screening DEXA at age 44  Consider FDA-approved medical therapies in postmenopausal women and men aged 72 years and older, based on the following: a) A hip or vertebral (clinical or morphometric) fracture b) T-score <= -2.5 at the femoral neck or spine after appropriate evaluation to exclude secondary causes C) Low bone mass (T-score between -1.0 and -2.5 at the femoral neck or spine) and a 10-year probability of a hip fracture >= 3% or a 10-year probability of a major osteoporosis-related fracture >= 20% based on the [place] -adapted WHO algorithm   5) Routine healthcare maintenance including cholesterol, diabetes screening discussed  managed by PCP  6) Colonoscopy last done in 2021 and is due in 2031.  Screening recommended starting at age 1 for average risk individuals, age 48 for  individuals deemed at increased risk (including African Americans) and recommended to continue until age 59.  For patient age 24-85 individualized approach is recommended.  Gold standard screening is via colonoscopy, Cologuard screening is an acceptable alternative for patient unwilling or unable to undergo colonoscopy.  Colorectal cancer screening for average?risk adults: 2018 guideline update from the American Cancer SocietyCA: A Cancer Journal for Clinicians: Apr 03, 2017   7) Increase healthy lifestyle; hydration, sleep, physical activity  8) Return in about 1 year (around 11/17/2025) for annual established gyn.    Slater Rains, CNM 11/20/2024 2:49 PM                  [1]  Allergies Allergen Reactions   Ace Inhibitors     angioedema   Codeine  Swelling   Valsartan Swelling   "

## 2024-11-20 ENCOUNTER — Encounter: Payer: Self-pay | Admitting: Advanced Practice Midwife

## 2024-11-20 NOTE — Patient Instructions (Signed)
 Health Maintenance for Postmenopausal Women Menopause is a normal process in which your ability to get pregnant comes to an end. This process happens slowly over many months or years, usually between the ages of 76 and 38. Menopause is complete when you have missed your menstrual period for 12 months. It is important to talk with your health care provider about some of the most common conditions that affect women after menopause (postmenopausal women). These include heart disease, cancer, and bone loss (osteoporosis). Adopting a healthy lifestyle and getting preventive care can help to promote your health and wellness. The actions you take can also lower your chances of developing some of these common conditions. What are the signs and symptoms of menopause? During menopause, you may have the following symptoms: Hot flashes. These can be moderate or severe. Night sweats. Decrease in sex drive. Mood swings. Headaches. Tiredness (fatigue). Irritability. Memory problems. Problems falling asleep or staying asleep. Talk with your health care provider about treatment options for your symptoms. Do I need hormone replacement therapy? Hormone replacement therapy is effective in treating symptoms that are caused by menopause, such as hot flashes and night sweats. Hormone replacement carries certain risks, especially as you become older. If you are thinking about using estrogen or estrogen with progestin, discuss the benefits and risks with your health care provider. How can I reduce my risk for heart disease and stroke? The risk of heart disease, heart attack, and stroke increases as you age. One of the causes may be a change in the body's hormones during menopause. This can affect how your body uses dietary fats, triglycerides, and cholesterol. Heart attack and stroke are medical emergencies. There are many things that you can do to help prevent heart disease and stroke. Watch your blood pressure High  blood pressure causes heart disease and increases the risk of stroke. This is more likely to develop in people who have high blood pressure readings or are overweight. Have your blood pressure checked: Every 3-5 years if you are 32-23 years of age. Every year if you are 31 years old or older. Eat a healthy diet  Eat a diet that includes plenty of vegetables, fruits, low-fat dairy products, and lean protein. Do not eat a lot of foods that are high in solid fats, added sugars, or sodium. Get regular exercise Get regular exercise. This is one of the most important things you can do for your health. Most adults should: Try to exercise for at least 150 minutes each week. The exercise should increase your heart rate and make you sweat (moderate-intensity exercise). Try to do strengthening exercises at least twice each week. Do these in addition to the moderate-intensity exercise. Spend less time sitting. Even light physical activity can be beneficial. Other tips Work with your health care provider to achieve or maintain a healthy weight. Do not use any products that contain nicotine or tobacco. These products include cigarettes, chewing tobacco, and vaping devices, such as e-cigarettes. If you need help quitting, ask your health care provider. Know your numbers. Ask your health care provider to check your cholesterol and your blood sugar (glucose). Continue to have your blood tested as directed by your health care provider. Do I need screening for cancer? Depending on your health history and family history, you may need to have cancer screenings at different stages of your life. This may include screening for: Breast cancer. Cervical cancer. Lung cancer. Colorectal cancer. What is my risk for osteoporosis? After menopause, you may be  at increased risk for osteoporosis. Osteoporosis is a condition in which bone destruction happens more quickly than new bone creation. To help prevent osteoporosis or  the bone fractures that can happen because of osteoporosis, you may take the following actions: If you are 24-54 years old, get at least 1,000 mg of calcium and at least 600 international units (IU) of vitamin D  per day. If you are older than age 75 but younger than age 30, get at least 1,200 mg of calcium and at least 600 international units (IU) of vitamin D  per day. If you are older than age 8, get at least 1,200 mg of calcium and at least 800 international units (IU) of vitamin D  per day. Smoking and drinking excessive alcohol increase the risk of osteoporosis. Eat foods that are rich in calcium and vitamin D , and do weight-bearing exercises several times each week as directed by your health care provider. How does menopause affect my mental health? Depression may occur at any age, but it is more common as you become older. Common symptoms of depression include: Feeling depressed. Changes in sleep patterns. Changes in appetite or eating patterns. Feeling an overall lack of motivation or enjoyment of activities that you previously enjoyed. Frequent crying spells. Talk with your health care provider if you think that you are experiencing any of these symptoms. General instructions See your health care provider for regular wellness exams and vaccines. This may include: Scheduling regular health, dental, and eye exams. Getting and maintaining your vaccines. These include: Influenza vaccine. Get this vaccine each year before the flu season begins. Pneumonia vaccine. Shingles vaccine. Tetanus, diphtheria, and pertussis (Tdap) booster vaccine. Your health care provider may also recommend other immunizations. Tell your health care provider if you have ever been abused or do not feel safe at home. Summary Menopause is a normal process in which your ability to get pregnant comes to an end. This condition causes hot flashes, night sweats, decreased interest in sex, mood swings, headaches, or lack  of sleep. Treatment for this condition may include hormone replacement therapy. Take actions to keep yourself healthy, including exercising regularly, eating a healthy diet, watching your weight, and checking your blood pressure and blood sugar levels. Get screened for cancer and depression. Make sure that you are up to date with all your vaccines. This information is not intended to replace advice given to you by your health care provider. Make sure you discuss any questions you have with your health care provider. Document Revised: 03/13/2021 Document Reviewed: 03/13/2021 Elsevier Patient Education  2024 [employer].

## 2024-11-23 ENCOUNTER — Other Ambulatory Visit: Payer: Self-pay | Admitting: Family Medicine

## 2024-11-23 DIAGNOSIS — F325 Major depressive disorder, single episode, in full remission: Secondary | ICD-10-CM

## 2024-11-23 DIAGNOSIS — I10 Essential (primary) hypertension: Secondary | ICD-10-CM

## 2024-11-23 DIAGNOSIS — E785 Hyperlipidemia, unspecified: Secondary | ICD-10-CM

## 2024-11-24 NOTE — Telephone Encounter (Signed)
 Requested Prescriptions  Refused Prescriptions Disp Refills   rosuvastatin  (CRESTOR ) 40 MG tablet [Pharmacy Med Name: ROSUVASTATIN  40MG  TABLETS] 90 tablet 1    Sig: TAKE 1 TABLET(40 MG) BY MOUTH DAILY     Cardiovascular:  Antilipid - Statins 2 Failed - 11/24/2024  1:13 PM      Failed - Lipid Panel in normal range within the last 12 months    Cholesterol, Total  Date Value Ref Range Status  05/04/2016 205 (H) 100 - 199 mg/dL Final   Cholesterol  Date Value Ref Range Status  06/11/2023 177 <200 mg/dL Final   LDL Cholesterol (Calc)  Date Value Ref Range Status  06/11/2023 103 (H) mg/dL (calc) Final    Comment:    Reference range: <100 . Desirable range <100 mg/dL for primary prevention;   <70 mg/dL for patients with CHD or diabetic patients  with > or = 2 CHD risk factors. SABRA LDL-C is now calculated using the Martin-Hopkins  calculation, which is a validated novel method providing  better accuracy than the Friedewald equation in the  estimation of LDL-C.  Gladis APPLETHWAITE et al. SANDREA. 7986;689(80): 2061-2068  (http://education.[facility].com/faq/FAQ164)    HDL  Date Value Ref Range Status  06/11/2023 53 > OR = 50 mg/dL Final  93/69/7982 64 >60 mg/dL Final   Triglycerides  Date Value Ref Range Status  06/11/2023 118 <150 mg/dL Final         Passed - Cr in normal range and within 360 days    Creat  Date Value Ref Range Status  06/11/2024 0.84 0.50 - 1.05 mg/dL Final   Creatinine, Urine  Date Value Ref Range Status  06/11/2023 637 (H) 20 - 275 mg/dL Final    Comment:    Verified by repeat analysis. SABRA Amy - Patient is not pregnant      Passed - Valid encounter within last 12 months    Recent Outpatient Visits           3 months ago Obesity (BMI 30.0-34.9)   [place] [facility] Mire, MD   6 months ago Morbid obesity [facility])   [place] [facility] Glen[facility] Mire, MD   8 months ago Erroneous encounter -  disregard   [facility] Glen[facility] Mire, MD   9 months ago Obesity (BMI 30.0-34.9)   [place] [facility] [place], Krichna, MD               triamterene -hydrochlorothiazide  (MAXZIDE-25) 37.5-25 MG tablet [Pharmacy Med Name: TRIAMTERENE  37.5MG / HCTZ25MG  TABS] 90 tablet 1    Sig: TAKE 1 TABLET BY MOUTH DAILY IN PLACE OF HCTZ 25     Cardiovascular: Diuretic Combos Passed - 11/24/2024  1:13 PM      Passed - K in normal range and within 180 days    Potassium  Date Value Ref Range Status  06/11/2024 4.0 3.5 - 5.3 mmol/L Final  10/23/2014 3.5 3.5 - 5.1 mmol/L Final         Passed - Na in normal range and within 180 days    Sodium  Date Value Ref Range Status  06/11/2024 144 135 - 146 mmol/L Final  12/24/2023 141 134 - 144 mmol/L Final  10/23/2014 138 136 - 145 mmol/L Final         Passed - Cr in normal range and within 180 days    Creat  Date Value Ref Range Status  06/11/2024 0.84 0.50 - 1.05 mg/dL Final   Creatinine, Urine  Date Value Ref Range Status  06/11/2023 637 (H) 20 - 275 mg/dL Final    Comment:    Verified by repeat analysis. .          Passed - Last BP in normal range    BP Readings from Last 1 Encounters:  11/17/24 139/86         Passed - Valid encounter within last 6 months    Recent Outpatient Visits           3 months ago Obesity (BMI 30.0-34.9)   [place] [facility] Glen[facility] Mire, MD   6 months ago Morbid obesity [facility])   [place] [facility] Glenard Mire, MD   8 months ago Erroneous encounter - disregard   [facility] Glenard Mire, MD   9 months ago Obesity (BMI 30.0-34.9)   [place] [facility] [place], Krichna, MD               buPROPion  (WELLBUTRIN  XL) 150 MG 24 hr tablet [Pharmacy Med Name: BUPROPION  XL 150MG  TABLETS (24 H)] 90 tablet 0    Sig: TAKE 1 TABLET(150 MG) BY MOUTH EVERY MORNING      Psychiatry: Antidepressants - bupropion  Passed - 11/24/2024  1:13 PM      Passed - Cr in normal range and within 360 days    Creat  Date Value Ref Range Status  06/11/2024 0.84 0.50 - 1.05 mg/dL Final   Creatinine, Urine  Date Value Ref Range Status  06/11/2023 637 (H) 20 - 275 mg/dL Final    Comment:    Verified by repeat analysis. .          Passed - AST in normal range and within 360 days    AST  Date Value Ref Range Status  06/11/2024 12 10 - 35 U/L Final   SGOT(AST)  Date Value Ref Range Status  10/23/2014 22 15 - 37 Unit/L Final         Passed - ALT in normal range and within 360 days    ALT  Date Value Ref Range Status  06/11/2024 10 6 - 29 U/L Final   SGPT (ALT)  Date Value Ref Range Status  10/23/2014 14 U/L Final    Comment:    14-63 NOTE: New Reference Range 05/25/14          Passed - Completed PHQ-2 or PHQ-9 in the last 360 days      Passed - Last BP in normal range    BP Readings from Last 1 Encounters:  11/17/24 139/86         Passed - Valid encounter within last 6 months    Recent Outpatient Visits           3 months ago Obesity (BMI 30.0-34.9)   [place] [facility] Glenard Mire, MD   6 months ago Morbid obesity [facility])   [place] [facility] Glen[facility] Mire, MD   8 months ago Erroneous encounter - disregard   [facility] Glen[facility] Mire, MD   9 months ago Obesity (BMI 30.0-34.9)   [place] [facility] [place], Mire, MD               sertraline  (ZOLOFT ) 50 MG tablet [Pharmacy Med Name: SERTRALINE  50MG  TABLETS] 90 tablet 1    Sig: TAKE 1 TABLET(50 MG) BY MOUTH DAILY  Psychiatry:  Antidepressants - SSRI - sertraline  Passed - 11/24/2024  1:13 PM      Passed - AST in normal range and within 360 days    AST  Date Value Ref Range Status  06/11/2024 12 10 - 35 U/L Final   SGOT(AST)  Date Value Ref Range Status  10/23/2014 22 15 - 37 Unit/L Final          Passed - ALT in normal range and within 360 days    ALT  Date Value Ref Range Status  06/11/2024 10 6 - 29 U/L Final   SGPT (ALT)  Date Value Ref Range Status  10/23/2014 14 U/L Final    Comment:    14-63 NOTE: New Reference Range 05/25/14          Passed - Completed PHQ-2 or PHQ-9 in the last 360 days      Passed - Valid encounter within last 6 months    Recent Outpatient Visits           3 months ago Obesity (BMI 30.0-34.9)   [place] [facility] Glenard Mire, MD   6 months ago Morbid obesity [facility])   [place] [facility] Glenard Mire, MD   8 months ago Erroneous encounter - disregard   [facility] MD   9 months ago Obesity (BMI 30.0-34.9)   [facility] [place], Krichna, MD

## 2024-11-24 NOTE — Telephone Encounter (Signed)
 Requested Prescriptions  Pending Prescriptions Disp Refills   rosuvastatin  (CRESTOR ) 40 MG tablet [Pharmacy Med Name: ROSUVASTATIN  40MG  TABLETS] 90 tablet 0    Sig: TAKE 1 TABLET(40 MG) BY MOUTH DAILY     Cardiovascular:  Antilipid - Statins 2 Failed - 11/24/2024  1:08 PM      Failed - Lipid Panel in normal range within the last 12 months    Cholesterol, Total  Date Value Ref Range Status  05/04/2016 205 (H) 100 - 199 mg/dL Final   Cholesterol  Date Value Ref Range Status  06/11/2023 177 <200 mg/dL Final   LDL Cholesterol (Calc)  Date Value Ref Range Status  06/11/2023 103 (H) mg/dL (calc) Final    Comment:    Reference range: <100 . Desirable range <100 mg/dL for primary prevention;   <70 mg/dL for patients with CHD or diabetic patients  with > or = 2 CHD risk factors. SABRA LDL-C is now calculated using the Martin-Hopkins  calculation, which is a validated novel method providing  better accuracy than the Friedewald equation in the  estimation of LDL-C.  Gladis APPLETHWAITE et al. SANDREA. 7986;689(80): 2061-2068  (http://education.[facility].com/faq/FAQ164)    HDL  Date Value Ref Range Status  06/11/2023 53 > OR = 50 mg/dL Final  93/69/7982 64 >60 mg/dL Final   Triglycerides  Date Value Ref Range Status  06/11/2023 118 <150 mg/dL Final         Passed - Cr in normal range and within 360 days    Creat  Date Value Ref Range Status  06/11/2024 0.84 0.50 - 1.05 mg/dL Final   Creatinine, Urine  Date Value Ref Range Status  06/11/2023 637 (H) 20 - 275 mg/dL Final    Comment:    Verified by repeat analysis. SABRA Amy - Patient is not pregnant      Passed - Valid encounter within last 12 months    Recent Outpatient Visits           3 months ago Obesity (BMI 30.0-34.9)   [place] [facility], [facility]   6 months ago Morbid obesity [facility])   [place] [facility] Glen[facility] Mire, MD   8 months ago Erroneous encounter -  disregard   [facility] Mire, MD   9 months ago Obesity (BMI 30.0-34.9)   [place] [facility] [place], Krichna, MD               buPROPion  (WELLBUTRIN  XL) 150 MG 24 hr tablet [Pharmacy Med Name: BUPROPION  XL 150MG  TABLETS (24 H)] 90 tablet 0    Sig: TAKE 1 TABLET(150 MG) BY MOUTH EVERY MORNING     Psychiatry: Antidepressants - bupropion  Passed - 11/24/2024  1:08 PM      Passed - Cr in normal range and within 360 days    Creat  Date Value Ref Range Status  06/11/2024 0.84 0.50 - 1.05 mg/dL Final   Creatinine, Urine  Date Value Ref Range Status  06/11/2023 637 (H) 20 - 275 mg/dL Final    Comment:    Verified by repeat analysis. .          Passed - AST in normal range and within 360 days    AST  Date Value Ref Range Status  06/11/2024 12 10 - 35 U/L Final   SGOT(AST)  Date Value Ref Range Status  10/23/2014 22 15 - 37 Unit/L Final  Passed - ALT in normal range and within 360 days    ALT  Date Value Ref Range Status  06/11/2024 10 6 - 29 U/L Final   SGPT (ALT)  Date Value Ref Range Status  10/23/2014 14 U/L Final    Comment:    14-63 NOTE: New Reference Range 05/25/14          Passed - Completed PHQ-2 or PHQ-9 in the last 360 days      Passed - Last BP in normal range    BP Readings from Last 1 Encounters:  11/17/24 139/86         Passed - Valid encounter within last 6 months    Recent Outpatient Visits           3 months ago Obesity (BMI 30.0-34.9)   [place] [facility] Glen[facility] Mire, MD   6 months ago Morbid obesity [facility])   [place] [facility] Glenard Mire, MD   8 months ago Erroneous encounter - disregard   [facility] Glen[facility] Mire, MD   9 months ago Obesity (BMI 30.0-34.9)   [facility] Sowles, Krichna, MD

## 2024-11-25 ENCOUNTER — Encounter: Payer: Self-pay | Admitting: Family Medicine

## 2024-11-25 ENCOUNTER — Ambulatory Visit (INDEPENDENT_AMBULATORY_CARE_PROVIDER_SITE_OTHER): Admitting: Family Medicine

## 2024-11-25 VITALS — BP 126/74 | HR 64 | Resp 16 | Ht 67.0 in | Wt 179.5 lb

## 2024-11-25 DIAGNOSIS — J4530 Mild persistent asthma, uncomplicated: Secondary | ICD-10-CM | POA: Diagnosis not present

## 2024-11-25 DIAGNOSIS — F325 Major depressive disorder, single episode, in full remission: Secondary | ICD-10-CM

## 2024-11-25 DIAGNOSIS — R7303 Prediabetes: Secondary | ICD-10-CM | POA: Diagnosis not present

## 2024-11-25 DIAGNOSIS — F321 Major depressive disorder, single episode, moderate: Secondary | ICD-10-CM

## 2024-11-25 DIAGNOSIS — E559 Vitamin D deficiency, unspecified: Secondary | ICD-10-CM | POA: Diagnosis not present

## 2024-11-25 DIAGNOSIS — E538 Deficiency of other specified B group vitamins: Secondary | ICD-10-CM

## 2024-11-25 DIAGNOSIS — J4520 Mild intermittent asthma, uncomplicated: Secondary | ICD-10-CM | POA: Diagnosis not present

## 2024-11-25 DIAGNOSIS — I10 Essential (primary) hypertension: Secondary | ICD-10-CM

## 2024-11-25 DIAGNOSIS — Z8639 Personal history of other endocrine, nutritional and metabolic disease: Secondary | ICD-10-CM | POA: Diagnosis not present

## 2024-11-25 DIAGNOSIS — E785 Hyperlipidemia, unspecified: Secondary | ICD-10-CM

## 2024-11-25 DIAGNOSIS — K219 Gastro-esophageal reflux disease without esophagitis: Secondary | ICD-10-CM | POA: Diagnosis not present

## 2024-11-25 MED ORDER — ROSUVASTATIN CALCIUM 40 MG PO TABS: 40.0000 mg | ORAL_TABLET | Freq: Every day | ORAL | 1 refills | Status: AC

## 2024-11-25 MED ORDER — BUPROPION HCL ER (XL) 150 MG PO TB24: 150.0000 mg | ORAL_TABLET | Freq: Every day | ORAL | 0 refills | Status: AC

## 2024-11-25 MED ORDER — OMEPRAZOLE 40 MG PO CPDR: 40.0000 mg | DELAYED_RELEASE_CAPSULE | Freq: Every day | ORAL | 0 refills | Status: AC

## 2024-11-25 MED ORDER — MONTELUKAST SODIUM 10 MG PO TABS: 10.0000 mg | ORAL_TABLET | Freq: Every day | ORAL | 1 refills | Status: AC

## 2024-11-25 MED ORDER — WEGOVY 4 MG PO TABS: 4.0000 mg | ORAL_TABLET | Freq: Every day | ORAL | 2 refills | Status: AC

## 2024-11-25 MED ORDER — AMLODIPINE BESYLATE 2.5 MG PO TABS: 2.5000 mg | ORAL_TABLET | Freq: Every day | ORAL | 1 refills | Status: AC

## 2024-11-25 NOTE — Progress Notes (Signed)
 Error - duplicate

## 2024-11-25 NOTE — Progress Notes (Signed)
 Name: Savannah Manning   MRN: [id number]    DOB: 1960-12-23   Date:11/25/2024       Progress Note  Subjective  Chief Complaint  Chief Complaint  Patient presents with   Medical Management of Chronic Issues   Discussed the use of AI scribe software for clinical note transcription with the patient, who gave verbal consent to proceed.  History of Present Illness Savannah Manning is a 64 year old female who presents for a routine three-month follow-up.  Her blood pressure during a physical exam in January measured 139/86 mmHg. She is currently taking amlodipine  2.5 mg and triamterene  hydrochlorothiazide  25 mg for hypertension with no side effects and today's bp is at goal . No chest pain or palpitations.  Dyslipidemia is managed with rosuvastatin  40 mg, and she reports no muscle aches or side effects. Her last cholesterol panel was in August 2024, and she is due for a lipid panel today.  She has a history of prediabetes, with her last A1c back to normal. She also has a history of vitamin D  deficiency, which was previously low but normalized. It has not been checked in four years.  She is taking bupropion  150 mg and sertraline  50 mg for depression and feels okay, with no significant feelings of depression or anxiety. She mentions stress related to work but denies feeling down, depressed, or hopeless. She experiences trouble concentrating, which affects her work, and has issues with sleep and energy levels.  She has been taking Zepbound  7.5 mg for weight loss, which has reduced her BMI to 28. She has a history of obesity and previously underwent bariatric surgery, which was not successful. She has been on weight loss medication for over a year and is concerned about maintaining her weight after stopping the medication. She eats small portions and tries to eat enough protein but in the past was unable to lose weight with diet and exercise only     Patient Active Problem List   Diagnosis Date Noted    Bacterial vaginosis 11/06/2024   Major depression in remission 08/25/2024   Lumbar spondylosis 05/06/2023   Primary osteoarthritis of left hip 05/06/2023   Intertrigo 05/01/2022   Insomnia due to mental condition 05/01/2022   Mild intermittent asthma without complication 10/20/2020   Primary osteoarthritis of both knees 10/20/2020   Primary osteoarthritis of right knee 04/06/2020   Primary osteoarthritis of left knee 04/06/2020   Pre-diabetes 12/05/2018   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/07/2018   Chronic pain of right knee 07/26/2017   Vitamin D  deficiency 04/24/2017   B12 deficiency 04/24/2017   Abnormal mammogram of right breast 01/01/2017   History of shoulder surgery 10/12/2016   Primary osteoarthritis of left shoulder 06/14/2016   Incomplete tear of left rotator cuff 05/30/2016   Rotator cuff tendinitis 05/30/2016   History of bariatric surgery 05/04/2016   Allergic rhinitis 05/12/2015   Benign essential HTN 05/12/2015   Dyslipidemia 05/12/2015   Bilateral lower extremity edema 05/12/2015   Gastroesophageal reflux disease without esophagitis 05/12/2015   Migraine 05/12/2015   Plantar fasciitis 05/12/2015   Umbilical hernia without obstruction and without gangrene 05/12/2015    Past Surgical History:  Procedure Laterality Date   ABDOMINAL HYSTERECTOMY  2012   supra cervical   CESAREAN SECTION  [id number]   CHOLECYSTECTOMY     COLONOSCOPY WITH PROPOFOL  N/A 12/18/2019   Procedure: COLONOSCOPY WITH PROPOFOL ;  Surgeon: Unk Corinn Skiff, MD;  [facility];  Service: Gastroenterology;  Laterality: N/A;  DILATION AND CURETTAGE OF UTERUS     ESOPHAGOGASTRODUODENOSCOPY (EGD) WITH PROPOFOL  N/A 03/18/2020   Procedure: ESOPHAGOGASTRODUODENOSCOPY (EGD) WITH PROPOFOL ;  Surgeon: Unk Corinn Skiff, MD;  Location: [facility] END[facility]COPY;  Service: Gastroenterology;  Laterality: N/A;   GASTRIC BYPASS  2005   INDUCED ABORTION N/A 1997   patient was about 22 weeks   POLYPECTOMY   12/18/2019   Procedure: POLYPECTOMY;  Surgeon: Unk Corinn Skiff, MD;  Location: [facility];  Service: Gastroenterology;;   SHOULDER ARTHROSCOPY WITH OPEN ROTATOR CUFF REPAIR Left 06/14/2016   Procedure: SHOULDER ARTHROSCOPY WITH OPEN ROTATOR CUFF REPAIR, DECOMPRESSION, EXCISION LOOSE BODY, DEBRIDEMENT;  Surgeon: Norleen JINNY Maltos, MD;  Location: [facility] ORS;  Service: Orthopedics;  Laterality: Left;    Family History  Problem Relation Age of Onset   Hypertension Mother    Cancer Father        prostate   Heart disease Brother    Breast cancer Cousin 40       mat cousin   Hypertension Brother    Hypertension Brother     Social History   Tobacco Use   Smoking status: Never   Smokeless tobacco: Never  Substance Use Topics   Alcohol use: No    Alcohol/week: 0.0 standard drinks of alcohol    Current Medications[1]  Allergies[2]  I personally reviewed active problem list, medication list, allergies, family history with the patient/caregiver today.   ROS  Ten systems reviewed and is negative except as mentioned in HPI    Objective Physical Exam  CONSTITUTIONAL: Patient appears well-developed and well-nourished. No distress. HEENT: Head atraumatic, normocephalic, neck supple. CARDIOVASCULAR: Normal rate, regular rhythm and normal heart sounds. No murmur heard. No BLE edema. PULMONARY: Effort normal and breath sounds normal. Lungs clear to auscultation bilaterally. No respiratory distress. ABDOMINAL: There is no tenderness or distention. MUSCULOSKELETAL: Normal gait. Without gross motor or sensory deficit. PSYCHIATRIC: Patient has a normal mood and affect. Behavior is normal. Judgment and thought content normal.  Vitals:   11/25/24 1541  BP: 126/74  Pulse: 64  Resp: 16  SpO2: 97%  Weight: 179 lb 8 oz (81.4 kg)  Height: 5' 7 (1.702 m)    Body mass index is 28.11 kg/m.  Recent Results (from the past 2160 hours)  POCT urinalysis dipstick     Status: Abnormal    Collection Time: 11/04/24  8:30 AM  Result Value Ref Range   Color, UA     Clarity, UA     Glucose, UA Negative Negative   Bilirubin, UA Negative    Ketones, UA positive    Spec Grav, UA 1.010 1.010 - 1.025   Blood, UA positive    pH, UA 5.5 5.0 - 8.0   Protein, UA Negative Negative   Urobilinogen, UA 0.2 0.2 or 1.0 E.U./dL   Nitrite, UA Negative    Leukocytes, UA Negative Negative   Appearance     Odor    Cervicovaginal ancillary only     Status: Abnormal   Collection Time: 11/04/24  8:30 AM  Result Value Ref Range   Neisseria Gonorrhea Negative    Chlamydia Negative    Trichomonas Negative    Bacterial Vaginitis (gardnerella) Positive (A)    Candida Vaginitis Negative    Candida Glabrata Negative    Comment      Normal Reference Range Bacterial Vaginosis - Negative   Comment Normal Reference Range Candida Species - Negative    Comment Normal Reference Range Candida Galbrata - Negative  Comment Normal Reference Range Trichomonas - Negative    Comment Normal Reference Ranger Chlamydia - Negative    Comment      Normal Reference Range Neisseria Gonorrhea - Negative  Urine Culture     Status: None   Collection Time: 11/04/24  8:58 AM   Specimen: Urine   UR  Result Value Ref Range   Urine Culture, Routine Final report    Organism ID, Bacteria Comment     Comment: Mixed urogenital flora Less than 10,000 colonies/mL      PHQ2/9:    11/25/2024    3:41 PM 11/17/2024    2:49 PM 08/25/2024    2:58 PM 05/20/2024    9:22 AM 02/11/2024    3:18 PM  Depression screen PHQ 2/9  Decreased Interest 0 0 0 3 1  Down, Depressed, Hopeless 0 0 0 2 2  PHQ - 2 Score 0 0 0 5 3  Altered sleeping   0 0 3  Tired, decreased energy   0 3 3  Change in appetite   0 0 1  Feeling bad or failure about yourself    0 0 0  Trouble concentrating   0 2 3  Moving slowly or fidgety/restless   0 0 0  Suicidal thoughts   0 0 0  PHQ-9 Score   0  10  13   Difficult doing work/chores   Not difficult  at all Somewhat difficult Not difficult at all     Data saved with a previous flowsheet row definition    phq 9 is negative  Fall Risk:    11/25/2024    3:41 PM 08/25/2024    2:58 PM 05/20/2024    9:19 AM 02/11/2024    3:07 PM 11/13/2023    3:22 PM  Fall Risk   Falls in the past year? 0 0 0 0 0  Number falls in past yr: 0 0 0 0 0  Injury with Fall? 0 0  0  0  0   Risk for fall due to : No Fall Risks No Fall Risks No Fall Risks  No Fall Risks  Follow up Falls evaluation completed Falls evaluation completed Falls evaluation completed Falls evaluation completed Falls prevention discussed;Education provided;Falls evaluation completed     Data saved with a previous flowsheet row definition      Assessment & Plan Weight management in a patient with history of obesity, status post bariatric surgery BMI decreased to 28. Discontinued Zepbound  due to insurance issues. Discussed portion control and dietary quality. Considered Wegovy  oral pills for maintenance. - Prescribed Wegovy  oral pills with two refills. - Advised on portion control and dietary quality. - Scheduled follow-up every three months.  Essential hypertension Blood pressure controlled at 126/74 mmHg on amlodipine  and triamterene  hydrochlorothiazide . No side effects reported. - Continue amlodipine  2.5 mg and triamterene  hydrochlorothiazide  25 mg. - Ordered comprehensive metabolic panel.  Dyslipidemia On rosuvastatin  40 mg with no side effects. Lipid panel needed. - Ordered lipid panel.  Major depressive disorder, moderate On bupropion  and sertraline . Reports stress and rumination. Discussed therapy benefits. - Continue bupropion  50 mg and sertraline  50 mg. - Discussed therapy for stress and rumination.  Prediabetes Previous A1c normal. Monitoring necessary. - Ordered A1c test.  Vitamin B12 deficiency Previous low levels. Monitoring necessary. - Ordered B12 level test.  Vitamin D  deficiency Previous levels  normalized. Monitoring necessary due to absorption issues. - Ordered vitamin D  level test.       [1]  Current  Outpatient Medications:    acetaminophen  (TYLENOL ) 325 MG tablet, Take 325 mg by mouth every 6 (six) hours as needed. PRN, Disp: , Rfl:    albuterol  (VENTOLIN  HFA) 108 (90 Base) MCG/ACT inhaler, Inhale 2 puffs into the lungs every 6 (six) hours as needed for wheezing or shortness of breath., Disp: 18 g, Rfl: 0   amLODipine  (NORVASC ) 2.5 MG tablet, Take 1 tablet (2.5 mg total) by mouth daily., Disp: 90 tablet, Rfl: 1   budesonide -formoterol  (SYMBICORT ) 160-4.5 MCG/ACT inhaler, Inhale 2 puffs into the lungs 2 (two) times daily., Disp: 1 each, Rfl: 3   buPROPion  (WELLBUTRIN  XL) 150 MG 24 hr tablet, TAKE 1 TABLET(150 MG) BY MOUTH EVERY MORNING, Disp: 90 tablet, Rfl: 0   Cholecalciferol (VITAMIN D3) 50 MCG (2000 UT) CAPS, Take 1 capsule by mouth daily after breakfast., Disp: , Rfl:    fluticasone  (FLONASE ) 50 MCG/ACT nasal spray, Place 2 sprays into both nostrils daily., Disp: 48 g, Rfl: 0   hydrOXYzine  (ATARAX ) 10 MG tablet, Take 1-2 tablets (10-20 mg total) by mouth every evening. For sleep, Disp: 30 tablet, Rfl: 0   loratadine  (CLARITIN ) 10 MG tablet, Take 1 tablet (10 mg total) by mouth daily., Disp: 90 tablet, Rfl: 1   montelukast  (SINGULAIR ) 10 MG tablet, Take 1 tablet (10 mg total) by mouth at bedtime., Disp: 90 tablet, Rfl: 1   nystatin  cream (MYCOSTATIN ), Apply 1 Application topically 2 (two) times daily., Disp: 30 g, Rfl: 5   omeprazole  (PRILOSEC) 40 MG capsule, Take 1 capsule (40 mg total) by mouth daily., Disp: 90 capsule, Rfl: 0   rosuvastatin  (CRESTOR ) 40 MG tablet, TAKE 1 TABLET(40 MG) BY MOUTH DAILY, Disp: 90 tablet, Rfl: 0   sertraline  (ZOLOFT ) 50 MG tablet, Take 1 tablet (50 mg total) by mouth daily., Disp: 90 tablet, Rfl: 1   triamterene -hydrochlorothiazide  (MAXZIDE-25) 37.5-25 MG tablet, Take 1 tablet by mouth daily. In place of HCTZ 25 , BP medication, Disp: 90 tablet,  Rfl: 1   ZEPBOUND  5 MG/0.5ML Pen, Inject 5 mg into the skin once a week., Disp: , Rfl:  [2]  Allergies Allergen Reactions   Ace Inhibitors     angioedema   Codeine  Swelling   Valsartan Swelling

## 2024-11-26 LAB — CBC WITH DIFFERENTIAL/PLATELET
Absolute Lymphocytes: 1696 {cells}/uL (ref 850–3900)
Absolute Monocytes: 405 {cells}/uL (ref 200–950)
Basophils Absolute: 22 {cells}/uL (ref 0–200)
Basophils Relative: 0.4 %
Eosinophils Absolute: 38 {cells}/uL (ref 15–500)
Eosinophils Relative: 0.7 %
HCT: 37.8 % (ref 35.9–46.0)
Hemoglobin: 12.5 g/dL (ref 11.7–15.5)
MCH: 29.6 pg (ref 27.0–33.0)
MCHC: 33.1 g/dL (ref 31.6–35.4)
MCV: 89.6 fL (ref 81.4–101.7)
MPV: 11.1 fL (ref 7.5–12.5)
Monocytes Relative: 7.5 %
Neutro Abs: 3240 {cells}/uL (ref 1500–7800)
Neutrophils Relative %: 60 %
Platelets: 236 Thousand/uL (ref 140–400)
RBC: 4.22 Million/uL (ref 3.80–5.10)
RDW: 13 % (ref 11.0–15.0)
Total Lymphocyte: 31.4 %
WBC: 5.4 Thousand/uL (ref 3.8–10.8)

## 2024-11-26 LAB — COMPREHENSIVE METABOLIC PANEL WITH GFR
AG Ratio: 1.9 (calc) (ref 1.0–2.5)
ALT: 7 U/L (ref 6–29)
AST: 10 U/L (ref 10–35)
Albumin: 4.4 g/dL (ref 3.6–5.1)
Alkaline phosphatase (APISO): 82 U/L (ref 37–153)
BUN: 18 mg/dL (ref 7–25)
CO2: 29 mmol/L (ref 20–32)
Calcium: 9.7 mg/dL (ref 8.6–10.4)
Chloride: 107 mmol/L (ref 98–110)
Creat: 0.88 mg/dL (ref 0.50–1.05)
Globulin: 2.3 g/dL (ref 1.9–3.7)
Glucose, Bld: 82 mg/dL (ref 65–99)
Potassium: 4 mmol/L (ref 3.5–5.3)
Sodium: 145 mmol/L (ref 135–146)
Total Bilirubin: 0.7 mg/dL (ref 0.2–1.2)
Total Protein: 6.7 g/dL (ref 6.1–8.1)
eGFR: 75 mL/min/1.73m2

## 2024-11-26 LAB — HEMOGLOBIN A1C
Hgb A1c MFr Bld: 5.3 %
Mean Plasma Glucose: 105 mg/dL
eAG (mmol/L): 5.8 mmol/L

## 2024-11-26 LAB — B12 AND FOLATE PANEL
Folate: 5.4 ng/mL — ABNORMAL LOW
Vitamin B-12: 286 pg/mL (ref 200–1100)

## 2024-11-26 LAB — LIPID PANEL
Cholesterol: 183 mg/dL
HDL: 70 mg/dL
LDL Cholesterol (Calc): 92 mg/dL
Non-HDL Cholesterol (Calc): 113 mg/dL
Total CHOL/HDL Ratio: 2.6 (calc)
Triglycerides: 116 mg/dL

## 2024-11-26 LAB — VITAMIN D 25 HYDROXY (VIT D DEFICIENCY, FRACTURES): Vit D, 25-Hydroxy: 34 ng/mL (ref 30–100)

## 2024-11-27 ENCOUNTER — Ambulatory Visit: Payer: Self-pay | Admitting: Family Medicine

## 2024-11-27 ENCOUNTER — Other Ambulatory Visit (HOSPITAL_COMMUNITY): Payer: Self-pay

## 2024-12-01 ENCOUNTER — Other Ambulatory Visit (HOSPITAL_COMMUNITY): Payer: Self-pay

## 2024-12-01 ENCOUNTER — Telehealth: Payer: Self-pay | Admitting: Pharmacy Technician

## 2024-12-01 NOTE — Telephone Encounter (Signed)
 Pharmacy Patient Advocate Encounter   Received notification from [facility] KEY that prior authorization for Wegovy  4 mg tablet is required/requested.   Insurance verification completed.   The patient is insured through [facility].   Per test claim: Per test claim, medication is not covered due to plan/benefit exclusion, PA not submitted at this time

## 2025-02-23 ENCOUNTER — Ambulatory Visit: Admitting: Family Medicine
